# Patient Record
Sex: Female | Born: 1945 | Race: White | Hispanic: No | Marital: Married | Smoking: Never smoker
Health system: Southern US, Community
[De-identification: ages and names within clinical notes are randomized; demographics above are authoritative.]

## PROBLEM LIST (undated history)

## (undated) DIAGNOSIS — I499 Cardiac arrhythmia, unspecified: Secondary | ICD-10-CM

## (undated) DIAGNOSIS — N200 Calculus of kidney: Secondary | ICD-10-CM

## (undated) DIAGNOSIS — Z87442 Personal history of urinary calculi: Secondary | ICD-10-CM

## (undated) DIAGNOSIS — J45909 Unspecified asthma, uncomplicated: Secondary | ICD-10-CM

## (undated) DIAGNOSIS — I480 Paroxysmal atrial fibrillation: Secondary | ICD-10-CM

## (undated) DIAGNOSIS — R011 Cardiac murmur, unspecified: Secondary | ICD-10-CM

## (undated) DIAGNOSIS — M1990 Unspecified osteoarthritis, unspecified site: Secondary | ICD-10-CM

## (undated) DIAGNOSIS — T8859XA Other complications of anesthesia, initial encounter: Secondary | ICD-10-CM

## (undated) DIAGNOSIS — Z9889 Other specified postprocedural states: Secondary | ICD-10-CM

## (undated) DIAGNOSIS — R112 Nausea with vomiting, unspecified: Secondary | ICD-10-CM

## (undated) DIAGNOSIS — E0590 Thyrotoxicosis, unspecified without thyrotoxic crisis or storm: Secondary | ICD-10-CM

## (undated) DIAGNOSIS — R0600 Dyspnea, unspecified: Secondary | ICD-10-CM

## (undated) DIAGNOSIS — E079 Disorder of thyroid, unspecified: Secondary | ICD-10-CM

## (undated) DIAGNOSIS — I10 Essential (primary) hypertension: Secondary | ICD-10-CM

## (undated) DIAGNOSIS — A159 Respiratory tuberculosis unspecified: Secondary | ICD-10-CM

## (undated) DIAGNOSIS — E039 Hypothyroidism, unspecified: Secondary | ICD-10-CM

## (undated) DIAGNOSIS — F329 Major depressive disorder, single episode, unspecified: Secondary | ICD-10-CM

## (undated) DIAGNOSIS — E785 Hyperlipidemia, unspecified: Secondary | ICD-10-CM

## (undated) DIAGNOSIS — F32A Depression, unspecified: Secondary | ICD-10-CM

## (undated) HISTORY — DX: Essential (primary) hypertension: I10

## (undated) HISTORY — PX: LITHOTRIPSY: SUR834

## (undated) HISTORY — DX: Depression, unspecified: F32.A

## (undated) HISTORY — DX: Calculus of kidney: N20.0

## (undated) HISTORY — DX: Paroxysmal atrial fibrillation: I48.0

## (undated) HISTORY — DX: Disorder of thyroid, unspecified: E07.9

## (undated) HISTORY — DX: Thyrotoxicosis, unspecified without thyrotoxic crisis or storm: E05.90

## (undated) HISTORY — DX: Hyperlipidemia, unspecified: E78.5

## (undated) HISTORY — DX: Major depressive disorder, single episode, unspecified: F32.9

---

## 1969-03-10 HISTORY — PX: THYROIDECTOMY: SHX17

## 1977-03-10 HISTORY — PX: TUBAL LIGATION: SHX77

## 2009-03-10 HISTORY — PX: REPLACEMENT TOTAL KNEE BILATERAL: SUR1225

## 2010-07-18 ENCOUNTER — Encounter: Payer: Self-pay | Admitting: Family Medicine

## 2010-07-18 ENCOUNTER — Ambulatory Visit (INDEPENDENT_AMBULATORY_CARE_PROVIDER_SITE_OTHER): Payer: Self-pay | Admitting: Family Medicine

## 2010-07-18 DIAGNOSIS — E88819 Insulin resistance, unspecified: Secondary | ICD-10-CM | POA: Insufficient documentation

## 2010-07-18 DIAGNOSIS — I10 Essential (primary) hypertension: Secondary | ICD-10-CM

## 2010-07-18 DIAGNOSIS — R7309 Other abnormal glucose: Secondary | ICD-10-CM

## 2010-07-18 DIAGNOSIS — K760 Fatty (change of) liver, not elsewhere classified: Secondary | ICD-10-CM

## 2010-07-18 DIAGNOSIS — K219 Gastro-esophageal reflux disease without esophagitis: Secondary | ICD-10-CM

## 2010-07-18 DIAGNOSIS — E8881 Metabolic syndrome: Secondary | ICD-10-CM

## 2010-07-18 DIAGNOSIS — K7689 Other specified diseases of liver: Secondary | ICD-10-CM

## 2010-07-18 DIAGNOSIS — E785 Hyperlipidemia, unspecified: Secondary | ICD-10-CM | POA: Insufficient documentation

## 2010-07-18 MED ORDER — KETOCONAZOLE 2 % EX CREA: TOPICAL_CREAM | Freq: Every day | CUTANEOUS | Status: DC

## 2010-07-18 MED ORDER — LISINOPRIL-HYDROCHLOROTHIAZIDE 10-12.5 MG PO TABS: 1.0000 | ORAL_TABLET | Freq: Every day | ORAL | Status: DC

## 2010-07-18 NOTE — Assessment & Plan Note (Signed)
Blood pressure is well controlled today. Refills were sent to the pharmacy. Followup in 6 months

## 2010-07-18 NOTE — Progress Notes (Signed)
Subjective:    Patient ID: Martha Perkins, female    DOB: 10-14-1945, 65 y.o.   MRN: [id number]  Hypertension This is a chronic problem. The current episode started more than 1 year ago. The problem is unchanged. The problem is controlled. Associated symptoms include peripheral edema. Pertinent negatives include no palpitations or shortness of breath. There are no associated agents to hypertension. Risk factors for coronary artery disease include no known risk factors. Past treatments include ACE inhibitors and diuretics. There are no compliance problems.  There is no history of chronic renal disease.   Hx of elevated sugars - Was dx in 12/2009 with insulin resistance. She is not getting any exercise. She has had bilat knee replacements and just hasn't gotten back in to exercise.   Off her statin for 6 month because of elevated liver enzymes. She had neg hepatis panel and then had an US of the liver that showed fatty liver.  She has not had her liver rechecked since then. She is due for that. Her physician wanted her off of this on for approximately 6 months to see how much her liver enzymes came down. She has not made any major lifestyle changes with diet or exercise actually improve her fatty liver.   Review of Systems  Respiratory: Negative for shortness of breath.   Cardiovascular: Negative for palpitations.   BP 115/71  Pulse 81  Ht 5\' 2"  (1.575 m)  Wt 199 lb (90.266 kg)  BMI 36.40 kg/m2    Allergies  Allergen Reactions  . Demerol   . Percocet (Oxycodone-Acetaminophen)     Past Medical History  Diagnosis Date  . Hypertension   . Hyperlipidemia   . Thyroid disease   . Kidney stone     Past Surgical History  Procedure Date  . Thyroidectomy 1971  . Tubal ligation 1979  . Replacement total knee bilateral 2011  . Lithotripsy     History   Social History  . Marital Status: Married    Spouse Name: N/A    Number of Children: N/A  . Years of Education: N/A   Occupational  History  . Retired Charity fundraiser.     Social History Main Topics  . Smoking status: Never Smoker   . Smokeless tobacco: Not on file  . Alcohol Use: No  . Drug Use: No  . Sexually Active: Not on file   Other Topics Concern  . Not on file   Social History Narrative  . No narrative on file    Family History  Problem Relation Age of Onset  . Coronary artery disease Mother 91    deceased  . Depression Mother   . Diabetes Paternal Grandmother   . Hyperlipidemia Mother     Current outpatient prescriptions:esomeprazole (NEXIUM) 40 MG capsule, Take 40 mg by mouth daily before breakfast.  , Disp: , Rfl: ;  ketoconazole (NIZORAL) 2 % cream, Apply topically daily.  , Disp: , Rfl: ;  lisinopril-hydrochlorothiazide (PRINZIDE,ZESTORETIC) 10-12.5 MG per tablet, Take 1 tablet by mouth daily., Disp: 30 tablet, Rfl: 6;  simvastatin (ZOCOR) 80 MG tablet, Take 80 mg by mouth at bedtime.  , Disp: , Rfl:  DISCONTD: lisinopril-hydrochlorothiazide (PRINZIDE,ZESTORETIC) 10-12.5 MG per tablet, Take 1 tablet by mouth daily.  , Disp: , Rfl: ;  ketoconazole (NIZORAL) 2 % cream, Apply topically daily., Disp: 15 g, Rfl: 1     Objective:   Physical Exam  Constitutional: She is oriented to person, place, and time. She appears well-developed and well-nourished.  HENT:  Head: Normocephalic and atraumatic.  Neck: Neck supple. No thyromegaly present.  Cardiovascular: Normal rate, regular rhythm and normal heart sounds.   Pulmonary/Chest: Effort normal and breath sounds normal.  Lymphadenopathy:    She has no cervical adenopathy.  Neurological: She is alert and oriented to person, place, and time.  Skin: Skin is warm and dry.  Psychiatric: She has a normal mood and affect.          Assessment & Plan:

## 2010-07-18 NOTE — Assessment & Plan Note (Signed)
We discussed it's important make lifestyle and dietary changes to improve her fatty liver. I did encourage her to start an exercise regimen. Now she's had bilateral knee replacements and is healed and doing well I don't see any reason why she can't get regular exercise.

## 2010-07-18 NOTE — Assessment & Plan Note (Signed)
She also has insulin resistance based on her history. She did have an A1c back verified this back in October. I let her know we will recheck a fasting glucose on her blood work to continue to monitor this. I recommend twice yearly monitoring to make sure she is not becoming diabetic. Also again exercise weight loss and healthy diet is very crucial to controlling her sugars. She admits she has not been doing this.

## 2010-07-18 NOTE — Assessment & Plan Note (Signed)
We will recheck her lipids and liver function off of the medication to see what her baseline is. I think she will likely still be a candidate for a statin as this is not a contraindication of fatty liver to discontinue his statin. Continuing a statin sometimes help with fatty liver disease. We can also certainly try a different statin.

## 2010-07-19 ENCOUNTER — Telehealth: Payer: Self-pay | Admitting: Family Medicine

## 2010-07-19 LAB — COMPLETE METABOLIC PANEL WITH GFR
ALT: 124 U/L — ABNORMAL HIGH (ref 0–35)
Alkaline Phosphatase: 67 U/L (ref 39–117)
CO2: 21 mEq/L (ref 19–32)
Creat: 0.69 mg/dL (ref 0.40–1.20)
GFR, Est African American: >60 mL/min (ref >60)
Total Bilirubin: 0.9 mg/dL (ref 0.3–1.2)

## 2010-07-19 LAB — LIPID PANEL
HDL: 42 mg/dL (ref >39)
LDL Cholesterol: 212 mg/dL — ABNORMAL HIGH (ref 0–99)
Total CHOL/HDL Ratio: 7.1 Ratio
Triglycerides: 216 mg/dL — ABNORMAL HIGH (ref <150)

## 2010-07-19 MED ORDER — PITAVASTATIN CALCIUM 2 MG PO TABS: 20.0000 mg | ORAL_TABLET | Freq: Every evening | ORAL | Status: DC

## 2010-07-19 NOTE — Telephone Encounter (Signed)
Call patient: Liver enzymes are still elevated AST was 66 ALT was 124. Since we cannot establish that this is her baseline liver function with your fatty liver disease then we can go ahead and restart a statin and then recheck your liver function in 6 weeks. If it has not changed significantly then we can continue a statin but it seems to trend even higher then we'll have to discontinue it again. I sent a prescription to [facility] for Livalo. If she has time she can come office and also pick up a coupon to take money off of her co-pay and she may get a fairly reasonable price.Marland Kitchen

## 2010-07-22 ENCOUNTER — Other Ambulatory Visit: Payer: Self-pay | Admitting: Family Medicine

## 2010-07-22 ENCOUNTER — Telehealth: Payer: Self-pay | Admitting: Family Medicine

## 2010-07-22 MED ORDER — PITAVASTATIN CALCIUM 2 MG PO TABS: 2.0000 mg | ORAL_TABLET | Freq: Every evening | ORAL | Status: DC

## 2010-07-22 NOTE — Telephone Encounter (Signed)
Pt.notified

## 2010-07-22 NOTE — Telephone Encounter (Signed)
There is a phone note from Friday on [facility] desktop with info regarding the lab results. Let me know if you can see if or not.

## 2010-07-22 NOTE — Telephone Encounter (Signed)
Pt wants recent lab results.  They appear abnormal.  Is there specific orders/instructions needed?  Please advise. Plan:  Routed to Dr. Theodosia Paling, CMA Jarvis Newcomer, LPN Domingo Dimes

## 2010-07-23 ENCOUNTER — Encounter: Payer: Self-pay | Admitting: Family Medicine

## 2010-08-06 NOTE — Telephone Encounter (Signed)
Pt was notified by Sue Lush, CMA about lab results.

## 2010-08-07 ENCOUNTER — Encounter: Payer: Self-pay | Admitting: Family Medicine

## 2010-10-15 ENCOUNTER — Telehealth: Payer: Self-pay | Admitting: Family Medicine

## 2010-10-15 NOTE — Telephone Encounter (Signed)
Pt wants to know which labs that she needs to repeat.  Wants to come on Thursday to do labwork.  I looked at the last office visit, but want to make sure we order everything needed.  Please advise and I'll be glad to put in the order.  Thanks. Routed to th provider for orders. Jarvis Newcomer, LPN Domingo Dimes

## 2010-10-16 ENCOUNTER — Telehealth: Payer: Self-pay | Admitting: Family Medicine

## 2010-10-16 DIAGNOSIS — E785 Hyperlipidemia, unspecified: Secondary | ICD-10-CM

## 2010-10-16 NOTE — Telephone Encounter (Signed)
Lipid panel and hepatic panel

## 2010-10-16 NOTE — Telephone Encounter (Signed)
Pt informed that lab orders were put in for a hepatic function panel and lipid profile.  Told the orders at [facility] so may go at there convenience to the lab. Jarvis Newcomer, LPN Domingo Dimes

## 2010-10-16 NOTE — Telephone Encounter (Signed)
Closed encounter °

## 2010-10-18 ENCOUNTER — Telehealth: Payer: Self-pay | Admitting: Family Medicine

## 2010-10-18 ENCOUNTER — Other Ambulatory Visit: Payer: Self-pay | Admitting: Family Medicine

## 2010-10-18 LAB — HEPATIC FUNCTION PANEL
ALT: 154 U/L — ABNORMAL HIGH (ref 0–35)
AST: 112 U/L — ABNORMAL HIGH (ref 0–37)
Alkaline Phosphatase: 67 U/L (ref 39–117)
Bilirubin, Direct: 0.2 mg/dL (ref 0.0–0.3)
Total Bilirubin: 1 mg/dL (ref 0.3–1.2)

## 2010-10-18 LAB — LIPID PANEL
Cholesterol: 189 mg/dL (ref 0–200)
HDL: 43 mg/dL (ref >39)
Total CHOL/HDL Ratio: 4.4 Ratio

## 2010-10-18 NOTE — Telephone Encounter (Signed)
Call pt: LDL chol and TG look much better on the livalo!!!. Continue med adn recheck in 1 year. She is due for a shingles vaccine will you see if she wants to come in for nurse visit this week to get one. Liver is still elevated but stable. This tells me the enzymes are from fatty liver and not the chol pill. Make sure still getting regular exericse and eating a very low fat diet.

## 2010-10-18 NOTE — Telephone Encounter (Signed)
Pt.notified

## 2010-10-21 ENCOUNTER — Other Ambulatory Visit: Payer: Self-pay | Admitting: *Deleted

## 2010-10-21 MED ORDER — PITAVASTATIN CALCIUM 2 MG PO TABS: 2.0000 mg | ORAL_TABLET | Freq: Every day | ORAL | Status: DC

## 2010-11-18 ENCOUNTER — Other Ambulatory Visit: Payer: Self-pay | Admitting: Family Medicine

## 2010-12-21 ENCOUNTER — Inpatient Hospital Stay (INDEPENDENT_AMBULATORY_CARE_PROVIDER_SITE_OTHER)
Admission: RE | Admit: 2010-12-21 | Discharge: 2010-12-21 | Disposition: A | Discharge status: Home / Self Care | Payer: Self-pay | Source: Ambulatory Visit | Attending: Family Medicine | Admitting: Family Medicine

## 2010-12-21 ENCOUNTER — Encounter: Payer: Self-pay | Admitting: Family Medicine

## 2010-12-21 DIAGNOSIS — I10 Essential (primary) hypertension: Secondary | ICD-10-CM | POA: Insufficient documentation

## 2010-12-21 DIAGNOSIS — J069 Acute upper respiratory infection, unspecified: Secondary | ICD-10-CM

## 2010-12-21 DIAGNOSIS — E785 Hyperlipidemia, unspecified: Secondary | ICD-10-CM | POA: Insufficient documentation

## 2010-12-21 DIAGNOSIS — J0100 Acute maxillary sinusitis, unspecified: Secondary | ICD-10-CM

## 2011-01-17 ENCOUNTER — Ambulatory Visit (INDEPENDENT_AMBULATORY_CARE_PROVIDER_SITE_OTHER): Payer: Self-pay | Admitting: Family Medicine

## 2011-01-17 ENCOUNTER — Encounter: Payer: Self-pay | Admitting: Family Medicine

## 2011-01-17 VITALS — BP 122/72 | HR 78 | Temp 98.8°F | Ht 62.0 in | Wt 205.0 lb

## 2011-01-17 DIAGNOSIS — J329 Chronic sinusitis, unspecified: Secondary | ICD-10-CM

## 2011-01-17 DIAGNOSIS — J40 Bronchitis, not specified as acute or chronic: Secondary | ICD-10-CM

## 2011-01-17 MED ORDER — HYDROCODONE-HOMATROPINE 5-1.5 MG/5ML PO SYRP: 5.0000 mL | ORAL_SOLUTION | Freq: Every evening | ORAL | Status: AC | PRN

## 2011-01-17 MED ORDER — DOXYCYCLINE HYCLATE 100 MG PO TABS: 100.0000 mg | ORAL_TABLET | Freq: Two times a day (BID) | ORAL | Status: DC

## 2011-01-17 MED ORDER — DOXYCYCLINE HYCLATE 100 MG PO TABS: 100.0000 mg | ORAL_TABLET | Freq: Two times a day (BID) | ORAL | Status: AC

## 2011-01-17 MED ORDER — LISINOPRIL-HYDROCHLOROTHIAZIDE 10-12.5 MG PO TABS: 1.0000 | ORAL_TABLET | Freq: Every day | ORAL | Status: DC

## 2011-01-17 MED ORDER — PITAVASTATIN CALCIUM 2 MG PO TABS: 2.0000 mg | ORAL_TABLET | Freq: Every day | ORAL | Status: DC

## 2011-01-17 NOTE — Progress Notes (Signed)
  Subjective:    Patient ID: Martha Perkins, female    DOB: Feb 16, 1946, 65 y.o.   MRN: [id number]  HPI Sinus pressuee and congestion about a month ago and with cough (approx 10/11). Got worse on 10/13 so came to UC. Given amox 500mg  tid and took for a week and then got worse. Was getting pain in her chest.  +Wheezing.  Then went to UC in [place] 12/28/10 and did CXR and no PNA or fluid.  Given and inhaler and given a zpack.  Says sinuses a little better but same chest sxs after took zpack. Still having congsetion, pressure in face and post nasal drip with yellow and sometime clear d/c. Cough is mild better. Still having chest discomfort in the muscles from coughing. Still having wheezing at night.  No energy. No fever now but did initially.    Review of Systems     Objective:   Physical Exam  Constitutional: She is oriented to person, place, and time. She appears well-developed and well-nourished.  HENT:  Head: Normocephalic and atraumatic.  Right Ear: External ear normal.  Left Ear: External ear normal.  Nose: Nose normal.  Mouth/Throat: Oropharynx is clear and moist.       TMs and canals are clear.   Eyes: Conjunctivae and EOM are normal. Pupils are equal, round, and reactive to light.  Neck: Neck supple. No thyromegaly present.  Cardiovascular: Normal rate, regular rhythm and normal heart sounds.   Pulmonary/Chest: Effort normal and breath sounds normal. She has no wheezes.  Lymphadenopathy:    She has no cervical adenopathy.  Neurological: She is alert and oriented to person, place, and time.  Skin: Skin is warm and dry.  Psychiatric: She has a normal mood and affect.          Assessment & Plan:  Sinusitis/Bronchitis - dicussed still could be viral even though had sxs for 4 week, thought she is some better. She has failed 2 rounds of ABX BUT can't afford a FQ. Will tx with doxy and pt to call if not better in one week. Also given cough med.  Was given one at [facility] but it cost $80 so she  didn't fill it. Has been uusing Delsym.   Needs refills on her BP and chol med.  BP looks great today.

## 2011-01-17 NOTE — Patient Instructions (Signed)
Call if not better in one week.  

## 2011-01-28 ENCOUNTER — Telehealth: Payer: Self-pay | Admitting: *Deleted

## 2011-01-28 MED ORDER — AMOXICILLIN-POT CLAVULANATE 875-125 MG PO TABS: 1.0000 | ORAL_TABLET | Freq: Two times a day (BID) | ORAL | Status: AC

## 2011-01-28 NOTE — Telephone Encounter (Signed)
I sent in a new antibiotic. She's not feeling better by Monday or Tuesday to please make an office visit for further examination.

## 2011-01-28 NOTE — Telephone Encounter (Signed)
Pt notified. KJ LPN 

## 2011-01-28 NOTE — Telephone Encounter (Signed)
Pt calls and states that seen around 10 days ago and still has persistent cough, sinus congestion and drainage, white phlegm, pain really bad in chest and right side and right side back. Ear pain bilateraaly. Painful swollen lymph node under right arm. Finished all meds given. Please advise

## 2011-02-10 NOTE — Progress Notes (Signed)
Summary: ?SINUS INFX (room 4)   Vital Signs:  Patient Profile:   65 Years Old Female CC:      congestion with cough and wheezing x 3 days Height:     62 inches Weight:      198 pounds O2 Sat:      95 % O2 treatment:    Room Air Temp:     98.8 degrees F oral Pulse rate:   98 / minute Resp:     16 per minute BP sitting:   142 / 87  (left arm) Cuff size:   regular  Vitals Entered By: Lavell Islam RN (December 21, 2010 12:01 PM)                  Updated Prior Medication List: * LIVALO daily * LISINOPRIL daily  Current Allergies: ! DEMEROL ! PERCODANHistory of Present Illness Chief Complaint: congestion with cough and wheezing x 3 days History of Present Illness:  Subjective: Patient complains of sinus congestion that started 3 days ago, followed by a mild sore throat and non-productive cough.  She has a history of seasonal allergies and recurring sinusitis.   No pleuritic pain + wheezing + post-nasal drainage + sinus pain/pressure No itchy/red eyes No earache No hemoptysis No SOB + low grade fever/chills No nausea No vomiting No abdominal pain No diarrhea No skin rashes + fatigue + myalgias No headache Used OTC meds without relief   REVIEW OF SYSTEMS Constitutional Symptoms       Complains of fever.     Denies chills, night sweats, weight loss, weight gain, and fatigue.  Eyes       Denies change in vision, eye pain, eye discharge, glasses, contact lenses, and eye surgery. Ear/Nose/Throat/Mouth       Complains of sinus problems.      Denies hearing loss/aids, change in hearing, ear pain, ear discharge, dizziness, frequent runny nose, frequent nose bleeds, sore throat, hoarseness, and tooth pain or bleeding.  Respiratory       Complains of dry cough and wheezing.      Denies productive cough, shortness of breath, asthma, bronchitis, and emphysema/COPD.      Comments: chest pain with cough Cardiovascular       Denies murmurs, chest pain, and tires easily  with exhertion.    Gastrointestinal       Denies stomach pain, nausea/vomiting, diarrhea, constipation, blood in bowel movements, and indigestion. Genitourniary       Denies painful urination, kidney stones, and loss of urinary control. Neurological       Denies paralysis, seizures, and fainting/blackouts. Musculoskeletal       Denies muscle pain, joint pain, joint stiffness, decreased range of motion, redness, swelling, muscle weakness, and gout.  Skin       Denies bruising, unusual mles/lumps or sores, and hair/skin or nail changes.  Psych       Denies mood changes, temper/anger issues, anxiety/stress, speech problems, depression, and sleep problems. Other Comments: congestion, cough with chest pain x 3 days   Past History:  Past Medical History: Hyperlipidemia Hypertension arthritis renal calculi  Past Surgical History: subtotal thyroidectomy urethroplasty Tubal ligation bilateral knee replacements lithotripsy  Family History: Family History of CAD  Family History Hypertension Family History Thyroid disease COPD  Social History: Never Smoked Alcohol use-no Drug use-no Smoking Status:  never Drug Use:  no   Objective:  No acute distress  Eyes:  Pupils are equal, round, and reactive to light and accomodation.  Extraocular  movement is intact.  Conjunctivae are not inflamed.  Ears:  Canals normal.  Tympanic membranes normal.   Nose:  Mildly congested turbinates.   Bilateral maxillary sinus tenderness  Pharynx:  Normal  Neck:  Supple.  Slightly tender shotty posterior nodes are palpated bilaterally.  Lungs:  Clear to auscultation.  Breath sounds are equal.  Chest:  There is tenderness over the sternum Heart:  Regular rate and rhythm without murmurs, rubs, or gallops.  Abdomen:  Nontender without masses or hepatosplenomegaly.  Bowel sounds are present.  No CVA or flank tenderness.  Extremities:  No edema.  Skin:  No rash Assessment New Problems: ACUTE  MAXILLARY SINUSITIS (ICD-461.0) UPPER RESPIRATORY INFECTION, ACUTE (ICD-465.9) FAMILY HISTORY OF CAD FEMALE 1ST DEGREE RELATIVE <50 (ICD-V17.3) HYPERTENSION (ICD-401.9) HYPERLIPIDEMIA (ICD-272.4)  VIRAL URI WITH EARLY SINUSITIS   Plan New Medications/Changes: BENZONATATE 200 MG CAPS (BENZONATATE) One by mouth hs as needed cough  #12 x 0, 12/21/2010, Donna Christen MD AMOXICILLIN 500 MG CAPS (AMOXICILLIN) One capsule by mouth three times a day  #42 x 0, 12/21/2010, Donna Christen MD LIVALO daily  #30 x 0, 12/21/2010, Donna Christen MD  New Orders: New Patient Level III [phone] Planning Comments:   Begin amoxicillin, expectorant/decongestant, topical decongestant, cough suppressant at bedtime.  Increase fluid intake Followup with PCP if not improving 7 to 10 days   The patient and/or caregiver has been counseled thoroughly with regard to medications prescribed including dosage, schedule, interactions, rationale for use, and possible side effects and they verbalize understanding.  Diagnoses and expected course of recovery discussed and will return if not improved as expected or if the condition worsens. Patient and/or caregiver verbalized understanding.  Prescriptions: BENZONATATE 200 MG CAPS (BENZONATATE) One by mouth hs as needed cough  #12 x 0   Entered and Authorized by:   Donna Christen MD   Signed by:   Donna Christen MD on 12/21/2010   Method used:   Print then Give to Patient   RxID:   [id number] AMOXICILLIN 500 MG CAPS (AMOXICILLIN) One capsule by mouth three times a day  #42 x 0   Entered and Authorized by:   Donna Christen MD   Signed by:   Donna Christen MD on 12/21/2010   Method used:   Print then Give to Patient   RxID:   [id number] LIVALO daily  #30 x 0   Entered by:   Lavell Islam RN   Authorized by:   Donna Christen MD   Signed by:   Donna Christen MD on 12/21/2010   Method used:   Print then Give to Patient   RxID:   [id number]   Patient  Instructions: 1)  Take Mucinex D (guaifenesin with decongestant) twice daily for congestion. 2)  Increase fluid intake, rest. 3)  Stop antihistamines for now. 4)  May use Afrin nasal spray (or generic oxymetazoline) twice daily for about 5 days.  Also recommend using saline nasal spray several times daily and/or saline nasal irrigation. 5)  Followup with family doctor if not improving 7 to 10 days.   Orders Added: 1)  New Patient Level III [42595]

## 2011-02-26 ENCOUNTER — Other Ambulatory Visit: Payer: Self-pay | Admitting: *Deleted

## 2011-02-26 MED ORDER — LISINOPRIL-HYDROCHLOROTHIAZIDE 10-12.5 MG PO TABS: 1.0000 | ORAL_TABLET | Freq: Every day | ORAL | Status: DC

## 2011-08-25 ENCOUNTER — Ambulatory Visit (INDEPENDENT_AMBULATORY_CARE_PROVIDER_SITE_OTHER): Payer: Self-pay | Admitting: Family Medicine

## 2011-08-25 ENCOUNTER — Encounter: Payer: Self-pay | Admitting: Family Medicine

## 2011-08-25 ENCOUNTER — Telehealth: Payer: Self-pay | Admitting: *Deleted

## 2011-08-25 VITALS — BP 125/72 | HR 79 | Ht 62.0 in | Wt 198.0 lb

## 2011-08-25 DIAGNOSIS — I10 Essential (primary) hypertension: Secondary | ICD-10-CM

## 2011-08-25 DIAGNOSIS — E785 Hyperlipidemia, unspecified: Secondary | ICD-10-CM

## 2011-08-25 DIAGNOSIS — M25519 Pain in unspecified shoulder: Secondary | ICD-10-CM

## 2011-08-25 LAB — COMPLETE METABOLIC PANEL WITH GFR
ALT: 63 U/L — ABNORMAL HIGH (ref 0–35)
AST: 35 U/L (ref 0–37)
Albumin: 4.3 g/dL (ref 3.5–5.2)
CO2: 27 mEq/L (ref 19–32)
Calcium: 9.6 mg/dL (ref 8.4–10.5)
Chloride: 106 mEq/L (ref 96–112)
Creat: 0.65 mg/dL (ref 0.50–1.10)
GFR, Est African American: >89 mL/min
Potassium: 4.3 mEq/L (ref 3.5–5.3)
Sodium: 142 mEq/L (ref 135–145)
Total Protein: 6.8 g/dL (ref 6.0–8.3)

## 2011-08-25 MED ORDER — PRAVASTATIN SODIUM 40 MG PO TABS: 40.0000 mg | ORAL_TABLET | Freq: Every day | ORAL | Status: DC

## 2011-08-25 NOTE — Patient Instructions (Signed)
Rotator Cuff Tear The rotator cuff is four tendons that assist in the motion of the shoulder. A rotator cuff tear is a tear in one of these four tendons. It is characterized by pain and weakness of the shoulder. The rotator cuff tendons surround the shoulder ball and socket joint (humeral head). The rotator cuff tendons attach to the shoulder blade (scapula) on one side and the upper arm bone (humerus) on the other side. The rotator cuff is essential for shoulder stability and shoulder motion. SYMPTOMS    Pain around the shoulder, often at the outer portion of the upper arm.   Pain that is worse with shoulder function, especially when reaching overhead or lifting.   Weakness of the shoulder muscles.   Aching when not using your arm; often, pain awakens you at night, especially when sleeping on the affected side.   Tenderness, swelling, warmth, or redness over the outer aspect of the shoulder.   Loss of strength.   Limited motion of the shoulder, especially reaching behind (reaching into one's back pocket) or across your body.   A crackling sound (crepitation) when moving the shoulder.   Biceps tendon pain (in the front of the shoulder) and inflammation, worse with bending the elbow or lifting.  CAUSES    Strain from sudden increase in amount or intensity of activity.   Direct blow or injury to the shoulder.   Aging, wear from from normal use.   Roof of the shoulder (acromial) spur.  RISK INCREASES WITH:    Contact sports (football, wrestling, or boxing).   Throwing or hitting sports (baseball, tennis, or volleyball).   Weightlifting and bodybuilding.   Heavy labor.   Previous injury to rotator cuff.   Failure to warm up properly before activity.   Inadequate protective equipment.   Increasing age.   Spurring of the outer end of the scapula (acromion).   Cortisone injections.   Poor shoulder strength and flexibility.  PREVENTION  Warm up and stretch properly  before activity.   Allow time for rest and recovery between practices and competition.   Maintain physical fitness:   Cardiovascular fitness.   Shoulder flexibility.   Strength and endurance of the rotator cuff muscles and muscles of the shoulder blade.   Learn and use proper technique when throwing or hitting.  PROGNOSIS Surgery is often needed. Although, symptoms may go away by themselves. RELATED COMPLICATIONS    Persistent pain that may progress to constant pain.   Shoulder stiffness, frozen shoulder syndrome, or loss of motion.   Recurrence of symptoms, especially if treated without surgery.   Inability to return to same level of sports, even with surgery.   Persistent weakness.   Risks of surgery, including infection, bleeding, injury to nerves, shoulder stiffness, weakness, re-tearing of the rotator cuff tendon.   Deltoid detachment, acromial fracture, and persistent pain.  TREATMENT Treatment involves the use of ice and medicine to reduce pain and inflammation. Strengthening and stretching exercise are usually recommended. These exercises may be completed at home or with a therapist. You may also be instructed to modify offending activities. Corticosteroid injections may be given to reduce inflammation. Surgery is usually recommended for athletes. Surgery has the best chance for a full recovery. Surgery involves:  Removal of an inflamed bursa.   Removal of an acromial spur if present.   Suturing the torn tendon back together.  Rotator cuff surgeries may be preformed either arthroscopically or through an open incision. Recovery typically takes 6 to 12   months. MEDICATION  If pain medicine is necessary, then nonsteroidal anti-inflammatory medicines, such as aspirin and ibuprofen, or other minor pain relievers, such as acetaminophen, are often recommended.   Do not take pain medicine for 7 days before surgery.   Prescription pain relievers are usually only prescribed  after surgery. Use only as directed and only as much as you need.   Corticosteroid injections may be given to reduce inflammation. However, there is a limited number of times the joint may be injected with these medicines.  HEAT AND COLD  Cold treatment (icing) relieves pain and reduces inflammation. Cold treatment should be applied for 10 to 15 minutes every 2 to 3 hours for inflammation and pain and immediately after any activity that aggravates your symptoms. Use ice packs or massage the area with a piece of ice (ice massage).   Heat treatment may be used prior to performing the stretching and strengthening activities prescribed by your caregiver, physical therapist, or athletic trainer. Use a heat pack or soak the injury in warm water.  SEEK MEDICAL CARE IF:    Symptoms get worse or do not improve in 4 to 6 weeks despite treatment.   You experience pain, numbness, or coldness in the hand.   Blue, gray, or dark color appears in the fingernails.   New, unexplained symptoms develop (drugs used in treatment may produce side effects).  Document Released: 02/24/2005 Document Revised: 02/13/2011 Document Reviewed: 06/08/2008 [employer] Patient Information 2012 [employer]. 

## 2011-08-25 NOTE — Progress Notes (Signed)
  Subjective:    Patient ID: Martha Perkins, female    DOB: 12/11/1945, 66 y.o.   MRN: [id number]  HPI Left shouldre pain. Hard to lift arm to shave under her arms. Painful to sleep on that side. Taking IBU for ehr arthritis.  No old injuries or surgeries.    HTN - tolerating.  No CP or SOB.  GEts winded with walking.    Hyperlipidemia-she stopped a little low because the cost. She is currently without insurance. She will not be of get it for another year and half until she reaches age 73 with Medicare.  She has been of statin for several months.    Review of Systems     Objective:   Physical Exam  Constitutional: She is oriented to person, place, and time. She appears well-developed and well-nourished.  HENT:  Head: Normocephalic and atraumatic.  Cardiovascular: Normal rate, regular rhythm and normal heart sounds.   Pulmonary/Chest: Effort normal and breath sounds normal.  Musculoskeletal:       Left shoulder w/ extension to 160 degrees.  Painful to reach behind her back. + empty can test.  Hand strength is 5/6. Non tender over teh shoulder joint.   Neurological: She is alert and oriented to person, place, and time.  Skin: Skin is warm and dry.  Psychiatric: She has a normal mood and affect. Her behavior is normal.          Assessment & Plan:  Left shoulder pain - mild RCC tear vs bursitis. Given H.O. On exercises.  Offered tramadol at bedtime prn if needed. Can use IBU as has already been using.   HTN- well controlled. At goal.  Check BMP today.   Hyperlipidemia - couldn't afford the livalo.  Will change to pravastatin. It may not get her to gaol but will at least help until gets insurance.  Also work on daliy walking, exercise and low fat diet.

## 2011-08-28 ENCOUNTER — Other Ambulatory Visit: Payer: Self-pay | Admitting: Family Medicine

## 2011-10-22 ENCOUNTER — Encounter: Payer: Self-pay | Admitting: Family Medicine

## 2011-10-22 ENCOUNTER — Ambulatory Visit (INDEPENDENT_AMBULATORY_CARE_PROVIDER_SITE_OTHER): Payer: Self-pay | Admitting: Family Medicine

## 2011-10-22 VITALS — BP 148/78 | HR 75 | Temp 97.8°F | Ht 62.0 in | Wt 200.0 lb

## 2011-10-22 DIAGNOSIS — R109 Unspecified abdominal pain: Secondary | ICD-10-CM

## 2011-10-22 DIAGNOSIS — N209 Urinary calculus, unspecified: Secondary | ICD-10-CM

## 2011-10-22 DIAGNOSIS — R1030 Lower abdominal pain, unspecified: Secondary | ICD-10-CM

## 2011-10-22 LAB — POCT URINALYSIS DIPSTICK
Ketones, UA: NEGATIVE
Protein, UA: NEGATIVE
Spec Grav, UA: 1.01
Urobilinogen, UA: 0.2

## 2011-10-22 MED ORDER — TAMSULOSIN HCL 0.4 MG PO CAPS: 0.4000 mg | ORAL_CAPSULE | Freq: Every day | ORAL | Status: DC

## 2011-10-22 MED ORDER — HYDROCODONE-ACETAMINOPHEN 5-325 MG PO TABS: 1.0000 | ORAL_TABLET | Freq: Four times a day (QID) | ORAL | Status: DC | PRN

## 2011-10-22 NOTE — Progress Notes (Signed)
  Subjective:    Patient ID: Martha Perkins, female    DOB: Apr 17, 1945, 66 y.o.   MRN: [id number]  HPI   has been waking up with twinges up pain in her pelvic area. Feels worse the last couple of days. Feels like sittig on a hot poker. Started several weeks ago. Says has been colicky. Says feels like a kidney stone she had about 8 years ago.  Hx of recurrent UTIs.  Hx of external urethroplasty years ago.  Will occ have blood in her urine even when not having pain. That has been going on for years.  No fever.  No flank pain.  Had to have lithotripsy.  Using naproxen for pain relief- Helps some.   Review of Systems     Objective:   Physical Exam  Constitutional: She is oriented to person, place, and time. She appears well-developed and well-nourished.  HENT:  Head: Normocephalic and atraumatic.  Cardiovascular: Normal rate, regular rhythm and normal heart sounds.   Pulmonary/Chest: Effort normal and breath sounds normal.  Neurological: She is alert and oriented to person, place, and time.  Skin: Skin is warm and dry.  Psychiatric: She has a normal mood and affect. Her behavior is normal.          Assessment & Plan:  Urinary tract stone - Discussed options. I would like ot refer her to Urology  Since she has had sxs for several week, but she doesn't have insurance. She wants to hold off. I explained that if has been going on for several weeks then she likley will not pass the stone.   Will given pain medication. Says has taken hydrocodone before and did ok. Will start flomax. Given strainer as well.  If not passing stone in a few days then will HAVE TO see Urology.

## 2011-10-22 NOTE — Patient Instructions (Signed)

## 2011-10-25 ENCOUNTER — Other Ambulatory Visit: Payer: Self-pay | Admitting: Family Medicine

## 2011-10-25 LAB — URINE CULTURE

## 2011-10-25 MED ORDER — CIPROFLOXACIN HCL 500 MG PO TABS: 500.0000 mg | ORAL_TABLET | Freq: Two times a day (BID) | ORAL | Status: AC

## 2011-11-24 ENCOUNTER — Telehealth: Payer: Self-pay | Admitting: *Deleted

## 2011-11-24 MED ORDER — AMOXICILLIN-POT CLAVULANATE 875-125 MG PO TABS: 1.0000 | ORAL_TABLET | Freq: Two times a day (BID) | ORAL | Status: AC

## 2011-11-24 NOTE — Telephone Encounter (Signed)
I don't normally send abx in over the phone but will do this time. Sent rx to her pharm listed below.

## 2011-11-24 NOTE — Telephone Encounter (Signed)
Pt states that she is having some sinus issues and congestion with cough and would like to know if we will send an abx to pharmacy. States she has no insurance and is still paying on the last 2 visits. Please advise.

## 2011-11-24 NOTE — Telephone Encounter (Signed)
Pt informed

## 2011-12-22 ENCOUNTER — Telehealth: Payer: Self-pay | Admitting: *Deleted

## 2011-12-22 ENCOUNTER — Encounter: Payer: Self-pay | Admitting: Physician Assistant

## 2011-12-22 ENCOUNTER — Ambulatory Visit (INDEPENDENT_AMBULATORY_CARE_PROVIDER_SITE_OTHER): Payer: No Typology Code available for payment source | Admitting: Physician Assistant

## 2011-12-22 VITALS — BP 129/66 | HR 85 | Temp 98.1°F | Ht 62.0 in | Wt 193.0 lb

## 2011-12-22 DIAGNOSIS — R05 Cough: Secondary | ICD-10-CM

## 2011-12-22 DIAGNOSIS — J329 Chronic sinusitis, unspecified: Secondary | ICD-10-CM

## 2011-12-22 DIAGNOSIS — R062 Wheezing: Secondary | ICD-10-CM

## 2011-12-22 MED ORDER — AZITHROMYCIN 250 MG PO TABS: ORAL_TABLET | ORAL | Status: DC

## 2011-12-22 MED ORDER — ALBUTEROL SULFATE HFA 108 (90 BASE) MCG/ACT IN AERS: 2.0000 | INHALATION_SPRAY | Freq: Four times a day (QID) | RESPIRATORY_TRACT | Status: DC | PRN

## 2011-12-22 MED ORDER — METHYLPREDNISOLONE 4 MG PO KIT: PACK | ORAL | Status: DC

## 2011-12-22 NOTE — Telephone Encounter (Signed)
Sorry wrong patient for the [employer]  This patient has a sinus infection with green drainage and wanted ABT's called to pharm

## 2011-12-22 NOTE — Telephone Encounter (Signed)
Needs appt. Can see Lesly Rubenstein today.

## 2011-12-22 NOTE — Patient Instructions (Signed)

## 2011-12-22 NOTE — Telephone Encounter (Signed)
Patient here in office seeing PA for sinus infection

## 2011-12-22 NOTE — Telephone Encounter (Signed)
Patient called c/o [employer] and [employer] and wants to know if she needs to be seen or if something could be called in for her.

## 2011-12-22 NOTE — Progress Notes (Signed)
  Subjective:    Patient ID: Martha Perkins, female    DOB: April 12, 1945, 66 y.o.   MRN: [id number]  HPI Patient is a 66 year old female who presents to the clinic with sinus pressure, congestion, cough. At the beginning of September Dr. Vinie Sill in prescription for Augmentin for 10 days for sinusitis. She did begin to feel better after finishing antibiotic but after about a week started to feel worse. She has a lot of sinus pressure. She feels very congested in her years. She still has drainage dripping down her throat. She coughs a lot during the day mostly nonproductive. Her cough is worse at night. She does have a long history of sinusitis. She has seen an ENT ears bad told her her sinuses are under developed. She's been taking decongestants over the past month and they do help some. She denies any chest pains or palpitations. She has not had any fever or chills. She has had some ongoing shortness of breath when she tries to do her normal activities. She denies any diagnosis of asthma or lung diseases.  Review of Systems     Objective:   Physical Exam  Constitutional: She is oriented to person, place, and time. She appears well-developed and well-nourished.  HENT:  Head: Normocephalic and atraumatic.  Right Ear: External ear normal.  Left Ear: External ear normal.  Nose: Nose normal.  Mouth/Throat: Oropharynx is clear and moist. No oropharyngeal exudate.       TMs clear bilaterally. No blood or pus. Oropharynx clear with some postnasal drip present.  Patient speak tender to palpation along the maxillary sinuses bilaterally negative for tenderness to palpation on the frontal sinuses.  Eyes: Conjunctivae normal are normal.  Neck: Normal range of motion. Neck supple.  Cardiovascular: Normal rate, regular rhythm and normal heart sounds.   Pulmonary/Chest: Effort normal.       Wheezing was heard at the base of bilateral lungs along with coarse breath sounds.  Lymphadenopathy:    She has no  cervical adenopathy.  Neurological: She is alert and oriented to person, place, and time.  Skin: Skin is warm and dry.  Psychiatric: She has a normal mood and affect. Her behavior is normal.          Assessment & Plan:  Sinusitis/bronchitis- will treat with Z-Pak for 5 days. Gave handout on sinusitis and bronchitis symptomatic care. Will also give Medrol dose pack and albuterol inhaler to use as needed. If patient not improving in the next 48 hours she is to call office. If sinus infection does not clear after this dose of antibiotic we may consider CT of the sinuses for possible for of need for sinus lavage.

## 2011-12-29 ENCOUNTER — Telehealth: Payer: Self-pay | Admitting: *Deleted

## 2011-12-29 MED ORDER — BENZONATATE 100 MG PO CAPS: 100.0000 mg | ORAL_CAPSULE | Freq: Three times a day (TID) | ORAL | Status: DC | PRN

## 2011-12-29 MED ORDER — HYDROCODONE-HOMATROPINE 5-1.5 MG/5ML PO SYRP: 5.0000 mL | ORAL_SOLUTION | Freq: Every evening | ORAL | Status: DC | PRN

## 2011-12-29 NOTE — Telephone Encounter (Signed)
I see an allergy to oxycodone/acetominophen. The cough syrup I could give before bed has hydrocodone in it. Have you ever had this before? If not I can give tessalon pearle to take up to three times a days. Let me know.

## 2011-12-29 NOTE — Telephone Encounter (Signed)
Sent cough syrup and tessalone pearles. Call if not improving or worsening.

## 2011-12-29 NOTE — Telephone Encounter (Signed)
Martha Perkins advised of medications and to call back if she does not improve or gets worse.

## 2011-12-29 NOTE — Telephone Encounter (Signed)
Spoke with pt and she says she has had the hydrocodone with phenergan in it in syrup before and does ok with it

## 2011-12-29 NOTE — Telephone Encounter (Signed)
Pt calls and states that still has horrible cough and would like to have a cough med sent to [facility] in [place]

## 2012-02-09 ENCOUNTER — Other Ambulatory Visit: Payer: Self-pay | Admitting: *Deleted

## 2012-02-09 MED ORDER — KETOCONAZOLE 2 % EX CREA: TOPICAL_CREAM | Freq: Every day | CUTANEOUS | Status: DC

## 2012-03-05 ENCOUNTER — Other Ambulatory Visit: Payer: Self-pay | Admitting: Family Medicine

## 2012-03-09 ENCOUNTER — Ambulatory Visit (INDEPENDENT_AMBULATORY_CARE_PROVIDER_SITE_OTHER): Payer: No Typology Code available for payment source | Admitting: Sports Medicine

## 2012-03-09 ENCOUNTER — Encounter: Payer: Self-pay | Admitting: Sports Medicine

## 2012-03-09 VITALS — BP 140/80 | HR 96 | Wt 195.0 lb

## 2012-03-09 DIAGNOSIS — J0100 Acute maxillary sinusitis, unspecified: Secondary | ICD-10-CM

## 2012-03-09 DIAGNOSIS — I10 Essential (primary) hypertension: Secondary | ICD-10-CM

## 2012-03-09 MED ORDER — AMOXICILLIN-POT CLAVULANATE 875-125 MG PO TABS: 1.0000 | ORAL_TABLET | Freq: Two times a day (BID) | ORAL | Status: DC

## 2012-03-09 MED ORDER — FLUTICASONE PROPIONATE 50 MCG/ACT NA SUSP: NASAL | Status: DC

## 2012-03-09 MED ORDER — LISINOPRIL-HYDROCHLOROTHIAZIDE 10-12.5 MG PO TABS: 1.0000 | ORAL_TABLET | Freq: Every day | ORAL | Status: DC

## 2012-03-09 NOTE — Assessment & Plan Note (Signed)
Augmentin, Flonase. Return to clinic as needed.

## 2012-03-09 NOTE — Assessment & Plan Note (Signed)
Refilled blood pressure medication. She will make her followup appointment with Dr. Linford Arnold.

## 2012-03-09 NOTE — Progress Notes (Signed)
Subjective:    CC: Sick  HPI: This is a very pleasant 66 year old female comes in with a several-day history of cough is productive of greenish sputum, greenish nasal discharge, sinus pressure over her maxillary sinuses. The pain and pressure does not radiate. She's had subjective fevers and chills, denies GI symptoms, denies sore throat. Symptoms are moderate.  Past medical history, Surgical history, Family history, Social history, Allergies, and medications have been entered into the medical record, reviewed, and no changes needed.   Review of Systems: No fevers, chills, night sweats, weight loss, chest pain, or shortness of breath.   Objective:    General: Well Developed, well nourished, and in no acute distress.  Neuro: Alert and oriented x3, extra-ocular muscles intact.  HEENT: Normocephalic, atraumatic, pupils equal round reactive to light, neck supple, no masses, no lymphadenopathy, thyroid nonpalpable. Nasopharynx shows boggy erythematous turbinates, oropharynx is unremarkable, external ear canals unremarkable. She is tender to palpation over the maxillary sinuses. Skin: Warm and dry, no rashes. Cardiac: Regular rate and rhythm, no murmurs rubs or gallops.  Respiratory: Clear to auscultation bilaterally. Not using accessory muscles, speaking in full sentences.  Impression and Recommendations:

## 2012-03-22 ENCOUNTER — Ambulatory Visit: Payer: Self-pay | Admitting: Family Medicine

## 2012-06-03 ENCOUNTER — Ambulatory Visit (INDEPENDENT_AMBULATORY_CARE_PROVIDER_SITE_OTHER): Payer: No Typology Code available for payment source | Admitting: Family Medicine

## 2012-06-03 ENCOUNTER — Encounter: Payer: Self-pay | Admitting: Family Medicine

## 2012-06-03 VITALS — BP 126/72 | HR 83 | Ht 62.0 in | Wt 193.0 lb

## 2012-06-03 DIAGNOSIS — I10 Essential (primary) hypertension: Secondary | ICD-10-CM

## 2012-06-03 DIAGNOSIS — H269 Unspecified cataract: Secondary | ICD-10-CM | POA: Insufficient documentation

## 2012-06-03 MED ORDER — LISINOPRIL-HYDROCHLOROTHIAZIDE 10-12.5 MG PO TABS: 1.0000 | ORAL_TABLET | Freq: Every day | ORAL | Status: DC

## 2012-06-03 NOTE — Progress Notes (Signed)
  Subjective:    Patient ID: Martha Perkins, female    DOB: 1945-06-20, 67 y.o.   MRN: [id number]  HPI HTN-  Pt denies chest pain, SOB, dizziness, or heart palpitations.  Taking meds as directed w/o problems.  Denies medication side effects. No lower Schimke edema. She was recently diagnosed with cataracts and is being followed by her eye doctor.  She also has a history of impaired fasting glucose and will finally get her health insurance sometime in July so we will hold off for testing until then per patient request.    Review of Systems     Objective:   Physical Exam  Constitutional: She is oriented to person, place, and time. She appears well-developed and well-nourished.  HENT:  Head: Normocephalic and atraumatic.  Cardiovascular: Normal rate, regular rhythm and normal heart sounds.   No carotid bruits.   Pulmonary/Chest: Effort normal and breath sounds normal.  Musculoskeletal: She exhibits no edema.  Neurological: She is alert and oriented to person, place, and time.  Skin: Skin is warm and dry.  Psychiatric: She has a normal mood and affect. Her behavior is normal.          Assessment & Plan:  HTN - well controlled.  F/U in 6 months.  Continue current regimen. Refill sent to her pharmacy. Continue work on low-salt diet and regular exercise and weight loss.

## 2012-06-28 ENCOUNTER — Telehealth: Payer: Self-pay | Admitting: *Deleted

## 2012-06-28 ENCOUNTER — Encounter: Payer: Self-pay | Admitting: *Deleted

## 2012-06-28 NOTE — Telephone Encounter (Signed)
Pt calls and states having exam and cleaning of teeth and has 2 artificial knees and needs prophylactic antibiotic sent to pharmacy. [name], LPN

## 2012-06-29 NOTE — Telephone Encounter (Signed)
Pt notified. Preslie Depasquale, LPN  

## 2012-06-29 NOTE — Telephone Encounter (Signed)
New guidelines don't recommend prophylaxis with antibiotic with artifical joints anymore unless have immunosupression, which she doesn't have.  This is good news!!!

## 2012-09-07 HISTORY — PX: CATARACT EXTRACTION: SUR2

## 2012-09-28 DIAGNOSIS — H538 Other visual disturbances: Secondary | ICD-10-CM | POA: Diagnosis not present

## 2012-09-28 DIAGNOSIS — H2510 Age-related nuclear cataract, unspecified eye: Secondary | ICD-10-CM | POA: Diagnosis not present

## 2012-10-04 DIAGNOSIS — H5200 Hypermetropia, unspecified eye: Secondary | ICD-10-CM | POA: Diagnosis not present

## 2012-10-04 DIAGNOSIS — M129 Arthropathy, unspecified: Secondary | ICD-10-CM | POA: Diagnosis not present

## 2012-10-04 DIAGNOSIS — H524 Presbyopia: Secondary | ICD-10-CM | POA: Diagnosis not present

## 2012-10-04 DIAGNOSIS — E7800 Pure hypercholesterolemia, unspecified: Secondary | ICD-10-CM | POA: Diagnosis not present

## 2012-10-04 DIAGNOSIS — H25049 Posterior subcapsular polar age-related cataract, unspecified eye: Secondary | ICD-10-CM | POA: Diagnosis not present

## 2012-10-04 DIAGNOSIS — M069 Rheumatoid arthritis, unspecified: Secondary | ICD-10-CM | POA: Diagnosis not present

## 2012-10-04 DIAGNOSIS — H5210 Myopia, unspecified eye: Secondary | ICD-10-CM | POA: Diagnosis not present

## 2012-10-04 DIAGNOSIS — Z885 Allergy status to narcotic agent status: Secondary | ICD-10-CM | POA: Diagnosis not present

## 2012-10-04 DIAGNOSIS — Z79899 Other long term (current) drug therapy: Secondary | ICD-10-CM | POA: Diagnosis not present

## 2012-10-04 DIAGNOSIS — H2510 Age-related nuclear cataract, unspecified eye: Secondary | ICD-10-CM | POA: Diagnosis not present

## 2012-10-04 DIAGNOSIS — H52209 Unspecified astigmatism, unspecified eye: Secondary | ICD-10-CM | POA: Diagnosis not present

## 2012-10-04 DIAGNOSIS — H538 Other visual disturbances: Secondary | ICD-10-CM | POA: Diagnosis not present

## 2012-10-04 DIAGNOSIS — H269 Unspecified cataract: Secondary | ICD-10-CM | POA: Diagnosis not present

## 2012-10-04 DIAGNOSIS — H43399 Other vitreous opacities, unspecified eye: Secondary | ICD-10-CM | POA: Diagnosis not present

## 2012-10-04 DIAGNOSIS — I10 Essential (primary) hypertension: Secondary | ICD-10-CM | POA: Diagnosis not present

## 2012-11-26 ENCOUNTER — Ambulatory Visit (INDEPENDENT_AMBULATORY_CARE_PROVIDER_SITE_OTHER): Payer: Medicare Other | Admitting: Family Medicine

## 2012-11-26 ENCOUNTER — Encounter: Payer: Self-pay | Admitting: Family Medicine

## 2012-11-26 ENCOUNTER — Other Ambulatory Visit (HOSPITAL_COMMUNITY)
Admission: RE | Admit: 2012-11-26 | Discharge: 2012-11-26 | Disposition: A | Discharge status: Home / Self Care | Payer: Medicare Other | Source: Ambulatory Visit | Attending: Family Medicine | Admitting: Family Medicine

## 2012-11-26 VITALS — BP 124/73 | HR 74 | Wt 186.0 lb

## 2012-11-26 DIAGNOSIS — Z1151 Encounter for screening for human papillomavirus (HPV): Secondary | ICD-10-CM | POA: Insufficient documentation

## 2012-11-26 DIAGNOSIS — Z0000 Encounter for general adult medical examination without abnormal findings: Secondary | ICD-10-CM | POA: Diagnosis not present

## 2012-11-26 DIAGNOSIS — I10 Essential (primary) hypertension: Secondary | ICD-10-CM

## 2012-11-26 DIAGNOSIS — Z23 Encounter for immunization: Secondary | ICD-10-CM

## 2012-11-26 DIAGNOSIS — Z1211 Encounter for screening for malignant neoplasm of colon: Secondary | ICD-10-CM

## 2012-11-26 DIAGNOSIS — Z01419 Encounter for gynecological examination (general) (routine) without abnormal findings: Secondary | ICD-10-CM | POA: Insufficient documentation

## 2012-11-26 DIAGNOSIS — Z124 Encounter for screening for malignant neoplasm of cervix: Secondary | ICD-10-CM

## 2012-11-26 DIAGNOSIS — Z1231 Encounter for screening mammogram for malignant neoplasm of breast: Secondary | ICD-10-CM

## 2012-11-26 LAB — COMPLETE METABOLIC PANEL WITH GFR
AST: 22 U/L (ref 0–37)
Albumin: 4.5 g/dL (ref 3.5–5.2)
BUN: 18 mg/dL (ref 6–23)
Calcium: 9.6 mg/dL (ref 8.4–10.5)
Chloride: 102 mEq/L (ref 96–112)
Glucose, Bld: 97 mg/dL (ref 70–99)
Potassium: 3.8 mEq/L (ref 3.5–5.3)

## 2012-11-26 LAB — LIPID PANEL
Cholesterol: 292 mg/dL — ABNORMAL HIGH (ref 0–200)
VLDL: 40 mg/dL (ref 0–40)

## 2012-11-26 NOTE — Patient Instructions (Signed)
Keep up a regular exercise program and make sure you are eating a healthy diet Try to eat 4 servings of dairy a day, or if you are lactose intolerant take a calcium with vitamin D daily.  Your vaccines are up to date.   

## 2012-11-26 NOTE — Progress Notes (Addendum)
Subjective:    Martha Perkins is a 67 y.o. female who presents for a welcome to Medicare exam. She has some updates.  She did have her left cataract treated in July of this year by Dr. Darcus Pester, ophtho in [place].  She also needs a refill on her blood pressure medications today. She also wants to discuss that she has had some more constipation recently and what would be the best thing to take. She also wants repeat her A1c to see how she's doing with her impaired fasting glucose. She has tried to make some dietary changes and has actually lost some weight which is fantastic.  Cardiac risk factors: advanced age (older than 22 for men, 33 for women), obesity (BMI >= 30 kg/m2) and sedentary lifestyle.  Activities of Daily Living  In your present state of health, do you have any difficulty performing the following activities?:  Preparing food and eating?: No Bathing yourself: No Getting dressed: No Using the toilet:No Moving around from place to place: No In the past year have you fallen or had a near fall?:No  Current exercise habits: walking some   Dietary issues discussed: none   Depression Screen (Note: if answer to either of the following is "Yes", then a more complete depression screening is indicated)  Q1: Over the past two weeks, have you felt down, depressed or hopeless?no Q2: Over the past two weeks, have you felt little interest or pleasure in doing things? no   The following portions of the patient's history were reviewed and updated as appropriate: allergies, current medications, past family history, past medical history, past social history, past surgical history and problem list. Review of Systems A comprehensive review of systems was negative.    Objective:     Vision by Snellen chart: just had cataract surgery in July There were no vitals taken for this visit. There is no weight on file to calculate BMI. There were no vitals taken for this visit.  General  Appearance:    Alert, cooperative, no distress, appears stated age  Head:    Normocephalic, without obvious abnormality, atraumatic  Eyes:    PERRL, conjunctiva/corneas clear, EOM's intact, fundi    benign, both eyes  Ears:    Normal TM's and external ear canals, both ears  Nose:   Nares normal, septum midline, mucosa normal, no drainage    or sinus tenderness  Throat:   Lips, mucosa, and tongue normal; teeth and gums normal  Neck:   Supple, symmetrical, trachea midline, no adenopathy;    thyroid:  no enlargement/tenderness/nodules; no carotid   bruit or JVD  Back:     Symmetric, no curvature, ROM normal, no CVA tenderness  Lungs:     Clear to auscultation bilaterally, respirations unlabored  Chest Wall:    No tenderness or deformity   Heart:    Regular rate and rhythm, S1 and S2 normal, no murmur, rub   or gallop  Breast Exam:    No tenderness, masses, or nipple abnormality  Abdomen:     Soft, non-tender, bowel sounds active all four quadrants,    no masses, no organomegaly  Genitalia:    Normal female without lesion, discharge or tenderness  Rectal:    Normal tone, normal prostate, no masses or tenderness;   guaiac negative stool  Extremities:   Extremities normal, atraumatic, no cyanosis or edema  Pulses:   2+ and symmetric all extremities  Skin:   Skin color, texture, turgor normal, no rashes or lesions  Lymph nodes:   Cervical, supraclavicular, and axillary nodes normal  Neurologic:   CNII-XII intact, normal strength, sensation and reflexes    throughout      Assessment:    Welcome Medicare visit     Plan:     During the course of the visit the patient was educated and counseled about appropriate screening and preventive services including:   Pneumococcal vaccine   Influenza vaccine  Constipation-encouraged her to increase her fluid intake and she oftentimes will go all day without drinking anything. Also encouraged her start a daily stool softener as part of her  regimen. Explained her that this is safe to do long-term and keep things a little but more regulated instead of waiting until she gets back up and then tried to take some type of laxative.  Occasionally has an odor to her urine. She's not having a right now but would like to have her urine checked. We will give her a sample cup to take home to test  It happens and she did bring up and we can check it.  Hypertension-well-controlled. Due for screening lipids and CMP.  Fatty liver/elevated liver enzymes-due to recheck liver enzymes today.  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  IFG - repeat A1C today is in the normal range.  Recheck in 1 year.   EKG shows rate of 72 beats per minute, normal sinus rhythm with normal axis. Possible Q wave in lead 3.  Patient Instructions (the written plan) was given to the patient.

## 2012-11-28 ENCOUNTER — Other Ambulatory Visit: Payer: Self-pay | Admitting: Family Medicine

## 2012-11-28 MED ORDER — SIMVASTATIN 40 MG PO TABS: 40.0000 mg | ORAL_TABLET | Freq: Every day | ORAL | Status: DC

## 2012-11-29 ENCOUNTER — Encounter: Payer: Self-pay | Admitting: *Deleted

## 2012-11-30 ENCOUNTER — Ambulatory Visit (INDEPENDENT_AMBULATORY_CARE_PROVIDER_SITE_OTHER): Payer: Medicare Other

## 2012-11-30 DIAGNOSIS — Z1231 Encounter for screening mammogram for malignant neoplasm of breast: Secondary | ICD-10-CM | POA: Diagnosis not present

## 2013-01-25 ENCOUNTER — Encounter: Payer: Self-pay | Admitting: Family Medicine

## 2013-02-02 ENCOUNTER — Other Ambulatory Visit: Payer: Self-pay | Admitting: Physician Assistant

## 2013-02-09 ENCOUNTER — Telehealth: Payer: Self-pay | Admitting: *Deleted

## 2013-02-09 MED ORDER — ALBUTEROL SULFATE HFA 108 (90 BASE) MCG/ACT IN AERS: 2.0000 | INHALATION_SPRAY | Freq: Four times a day (QID) | RESPIRATORY_TRACT | Status: DC | PRN

## 2013-02-09 NOTE — Telephone Encounter (Signed)
Changed inhaler to ProAir.

## 2013-03-08 DIAGNOSIS — J019 Acute sinusitis, unspecified: Secondary | ICD-10-CM | POA: Diagnosis not present

## 2013-04-12 ENCOUNTER — Other Ambulatory Visit: Payer: Self-pay | Admitting: Family Medicine

## 2013-04-14 ENCOUNTER — Telehealth: Payer: Self-pay | Admitting: *Deleted

## 2013-04-14 NOTE — Telephone Encounter (Signed)
Pt called wanting advice on what she can take until she is seen by provider on tomorrow. Pt advised to try sudafed,delsym, mucinex, aleve, zinc and vit c. .Elouise Munroe

## 2013-04-15 ENCOUNTER — Encounter: Payer: Self-pay | Admitting: Physician Assistant

## 2013-04-15 ENCOUNTER — Ambulatory Visit (INDEPENDENT_AMBULATORY_CARE_PROVIDER_SITE_OTHER): Payer: Medicare Other | Admitting: Physician Assistant

## 2013-04-15 VITALS — BP 140/80 | HR 109 | Temp 98.0°F | Wt 194.0 lb

## 2013-04-15 DIAGNOSIS — R059 Cough, unspecified: Secondary | ICD-10-CM

## 2013-04-15 DIAGNOSIS — J209 Acute bronchitis, unspecified: Secondary | ICD-10-CM | POA: Diagnosis not present

## 2013-04-15 DIAGNOSIS — R062 Wheezing: Secondary | ICD-10-CM | POA: Diagnosis not present

## 2013-04-15 DIAGNOSIS — R05 Cough: Secondary | ICD-10-CM | POA: Diagnosis not present

## 2013-04-15 MED ORDER — PREDNISONE 50 MG PO TABS: ORAL_TABLET | ORAL | Status: DC

## 2013-04-15 MED ORDER — BENZONATATE 200 MG PO CAPS: 200.0000 mg | ORAL_CAPSULE | Freq: Two times a day (BID) | ORAL | Status: DC | PRN

## 2013-04-15 MED ORDER — AZITHROMYCIN 250 MG PO TABS: ORAL_TABLET | ORAL | Status: DC

## 2013-04-15 MED ORDER — HYDROCODONE-HOMATROPINE 5-1.5 MG/5ML PO SYRP: 5.0000 mL | ORAL_SOLUTION | Freq: Every evening | ORAL | Status: DC | PRN

## 2013-04-15 MED ORDER — ALBUTEROL SULFATE (2.5 MG/3ML) 0.083% IN NEBU
2.5000 mg | INHALATION_SOLUTION | Freq: Once | RESPIRATORY_TRACT | Status: AC
  Administered 2013-04-15: 2.5 mg via RESPIRATORY_TRACT

## 2013-04-15 MED ORDER — ALBUTEROL SULFATE HFA 108 (90 BASE) MCG/ACT IN AERS: 2.0000 | INHALATION_SPRAY | Freq: Four times a day (QID) | RESPIRATORY_TRACT | Status: DC | PRN

## 2013-04-15 NOTE — Patient Instructions (Signed)
Start zpak and prednisone for 5 days.  Albuterol as needed.  Tessalone pearles during the day.  Hycodan at night.   Acute Bronchitis Bronchitis is inflammation of the airways that extend from the windpipe into the lungs (bronchi). The inflammation often causes mucus to develop. This leads to a cough, which is the most common symptom of bronchitis.  In acute bronchitis, the condition usually develops suddenly and goes away over time, usually in a couple weeks. Smoking, allergies, and asthma can make bronchitis worse. Repeated episodes of bronchitis may cause further lung problems.  CAUSES Acute bronchitis is most often caused by the same virus that causes a cold. The virus can spread from person to person (contagious).  SIGNS AND SYMPTOMS   Cough.   Fever.   Coughing up mucus.   Body aches.   Chest congestion.   Chills.   Shortness of breath.   Sore throat.  DIAGNOSIS  Acute bronchitis is usually diagnosed through a physical exam. Tests, such as chest X-rays, are sometimes done to rule out other conditions.  TREATMENT  Acute bronchitis usually goes away in a couple weeks. Often times, no medical treatment is necessary. Medicines are sometimes given for relief of fever or cough. Antibiotics are usually not needed but may be prescribed in certain situations. In some cases, an inhaler may be recommended to help reduce shortness of breath and control the cough. A cool mist vaporizer may also be used to help thin bronchial secretions and make it easier to clear the chest.  HOME CARE INSTRUCTIONS  Get plenty of rest.   Drink enough fluids to keep your urine clear or pale yellow (unless you have a medical condition that requires fluid restriction). Increasing fluids may help thin your secretions and will prevent dehydration.   Only take over-the-counter or prescription medicines as directed by your health care provider.   Avoid smoking and secondhand smoke. Exposure to  cigarette smoke or irritating chemicals will make bronchitis worse. If you are a smoker, consider using nicotine gum or skin patches to help control withdrawal symptoms. Quitting smoking will help your lungs heal faster.   Reduce the chances of another bout of acute bronchitis by washing your hands frequently, avoiding people with cold symptoms, and trying not to touch your hands to your mouth, nose, or eyes.   Follow up with your health care provider as directed.  SEEK MEDICAL CARE IF: Your symptoms do not improve after 1 week of treatment.  SEEK IMMEDIATE MEDICAL CARE IF:  You develop an increased fever or chills.   You have chest pain.   You have severe shortness of breath.  You have bloody sputum.   You develop dehydration.  You develop fainting.  You develop repeated vomiting.  You develop a severe headache. MAKE SURE YOU:   Understand these instructions.  Will watch your condition.  Will get help right away if you are not doing well or get worse. Document Released: 04/03/2004 Document Revised: 10/27/2012 Document Reviewed: 08/17/2012 [facility] Patient Information 2014 [place].

## 2013-04-15 NOTE — Progress Notes (Signed)
   Subjective:    Patient ID: Martha Perkins, female    DOB: February 24, 1946, 68 y.o.   MRN: [id number]  HPI Patient is a 68 year old female who presents to the clinic with cough, wheezing, shortness of breath and chest pain for the last week and half. She was seen after Christmas and diagnosed with a sinus infection. She was treated with Augmentin. She did get some better but drainage remained. She's had continual congestion since. Over the last week and a half her wheezing has gotten worse. Her cough is very productive and has turned green in color. She denies any fever or chills or body aches. She has been trying Sudafed, Mucinex, Robitussin DM and allergy medication. She has significant sinus pressure and ear pressure. She has a sore throat that she believes is from the cough. She's been trying her albuterol inhaler but has been using an expired inhaler.    Review of Systems     Objective:   Physical Exam  Constitutional: She is oriented to person, place, and time. She appears well-developed and well-nourished.  HENT:  Head: Normocephalic and atraumatic.  Right Ear: External ear normal.  Left Ear: External ear normal.  Nose: Nose normal.  Mouth/Throat: Oropharynx is clear and moist.  Neck: Normal range of motion. Neck supple.  Cardiovascular: Normal rate, regular rhythm and normal heart sounds.   Pulmonary/Chest: Effort normal and breath sounds normal.  Did not listen to patient's lung pre-nebulizer. After nebulizer there was some rhonchi in bilateral lung bases but no wheezing heard. Patient is able to complete sentences and is in no respiratory distress.  Lymphadenopathy:    She has no cervical adenopathy.  Neurological: She is alert and oriented to person, place, and time.  Skin: Skin is dry.  Psychiatric: She has a normal mood and affect. Her behavior is normal.          Assessment & Plan:  Acute bronchitis-peak flows in the yellow zone and nebulizer of albuterol 2.5 mg was given  in office today. Patient was started on Z-Pak for 5 days with prednisone burst for 5 days. Tessalon Perles were given for cough during the day as well as Hycodan for cough at bedtime. Patient was given pain mother refill on her albuterol inhaler and instructed to use for the next couple days regularly at least every 6-8 hours. She can begin to back off and she feels better.

## 2013-04-21 ENCOUNTER — Telehealth: Payer: Self-pay | Admitting: *Deleted

## 2013-04-21 ENCOUNTER — Other Ambulatory Visit: Payer: Self-pay | Admitting: *Deleted

## 2013-04-21 DIAGNOSIS — J0100 Acute maxillary sinusitis, unspecified: Secondary | ICD-10-CM

## 2013-04-21 MED ORDER — FLUTICASONE PROPIONATE 50 MCG/ACT NA SUSP: NASAL | Status: DC

## 2013-04-21 NOTE — Telephone Encounter (Signed)
Pt notified & flonase refill sent to pharm.

## 2013-04-21 NOTE — Telephone Encounter (Signed)
Pt was treated for bronchitis on 2/6 by Luvenia Starch. Pt left vm this morning saying that she finished the z-pack & is using her inhaler q6 but still has a lot of sinus pressure, coughing, & is still wheezing.

## 2013-04-21 NOTE — Telephone Encounter (Signed)
Needs to use her flonase and increase to 2 sprays each nostril BID, also get OTC decongestant like dayquil.

## 2013-05-06 ENCOUNTER — Telehealth: Payer: Self-pay | Admitting: *Deleted

## 2013-05-06 ENCOUNTER — Other Ambulatory Visit: Payer: Self-pay | Admitting: Physician Assistant

## 2013-05-06 MED ORDER — PREDNISONE 50 MG PO TABS: ORAL_TABLET | ORAL | Status: DC

## 2013-05-06 NOTE — Telephone Encounter (Signed)
Left detailed message on vm.

## 2013-05-06 NOTE — Telephone Encounter (Signed)
Pt left message stating that even after all the meds & the increase of flonase that Dr. Darene Lamer advised, she is still coughing & wheezing.  She stated on her vm that she could not come in today.

## 2013-05-06 NOTE — Telephone Encounter (Signed)
Will send over prednisone for weekend. If continues come in Monday for office visit. Continue symptomatic care previously discussed.

## 2013-05-06 NOTE — Telephone Encounter (Signed)
Continue to use albuterol every 4-6 hours.

## 2013-05-27 ENCOUNTER — Ambulatory Visit: Payer: Self-pay | Admitting: Family Medicine

## 2013-05-31 ENCOUNTER — Ambulatory Visit (INDEPENDENT_AMBULATORY_CARE_PROVIDER_SITE_OTHER): Payer: Medicare Other

## 2013-05-31 ENCOUNTER — Encounter: Payer: Self-pay | Admitting: Family Medicine

## 2013-05-31 ENCOUNTER — Ambulatory Visit (INDEPENDENT_AMBULATORY_CARE_PROVIDER_SITE_OTHER): Payer: Medicare Other | Admitting: Family Medicine

## 2013-05-31 VITALS — BP 132/74 | HR 83 | Wt 197.0 lb

## 2013-05-31 DIAGNOSIS — I10 Essential (primary) hypertension: Secondary | ICD-10-CM

## 2013-05-31 DIAGNOSIS — M19019 Primary osteoarthritis, unspecified shoulder: Secondary | ICD-10-CM

## 2013-05-31 DIAGNOSIS — M25511 Pain in right shoulder: Secondary | ICD-10-CM

## 2013-05-31 DIAGNOSIS — M25519 Pain in unspecified shoulder: Secondary | ICD-10-CM

## 2013-05-31 DIAGNOSIS — J329 Chronic sinusitis, unspecified: Secondary | ICD-10-CM | POA: Diagnosis not present

## 2013-05-31 LAB — COMPLETE METABOLIC PANEL WITH GFR
ALBUMIN: 4.5 g/dL (ref 3.5–5.2)
ALT: 36 U/L — ABNORMAL HIGH (ref 0–35)
AST: 26 U/L (ref 0–37)
Alkaline Phosphatase: 56 U/L (ref 39–117)
BUN: 14 mg/dL (ref 6–23)
CO2: 27 meq/L (ref 19–32)
Calcium: 9.6 mg/dL (ref 8.4–10.5)
Chloride: 104 mEq/L (ref 96–112)
Creat: 0.65 mg/dL (ref 0.50–1.10)
GFR, Est African American: >89 mL/min
GFR, Est Non African American: >89 mL/min
Glucose, Bld: 99 mg/dL (ref 70–99)
POTASSIUM: 4 meq/L (ref 3.5–5.3)
SODIUM: 139 meq/L (ref 135–145)
TOTAL PROTEIN: 7 g/dL (ref 6.0–8.3)
Total Bilirubin: 0.9 mg/dL (ref 0.2–1.2)

## 2013-05-31 LAB — LIPID PANEL
CHOLESTEROL: 230 mg/dL — AB (ref 0–200)
HDL: 48 mg/dL (ref >39)
LDL Cholesterol: 148 mg/dL — ABNORMAL HIGH (ref 0–99)
Total CHOL/HDL Ratio: 4.8 Ratio
Triglycerides: 169 mg/dL — ABNORMAL HIGH (ref <150)
VLDL: 34 mg/dL (ref 0–40)

## 2013-05-31 MED ORDER — AMOXICILLIN-POT CLAVULANATE 875-125 MG PO TABS: 1.0000 | ORAL_TABLET | Freq: Two times a day (BID) | ORAL | Status: DC

## 2013-05-31 MED ORDER — TRAMADOL HCL 50 MG PO TABS: 50.0000 mg | ORAL_TABLET | Freq: Three times a day (TID) | ORAL | Status: DC | PRN

## 2013-05-31 MED ORDER — SIMVASTATIN 40 MG PO TABS: 40.0000 mg | ORAL_TABLET | Freq: Every day | ORAL | Status: DC

## 2013-05-31 MED ORDER — LISINOPRIL-HYDROCHLOROTHIAZIDE 10-12.5 MG PO TABS: 1.0000 | ORAL_TABLET | Freq: Every day | ORAL | Status: DC

## 2013-05-31 NOTE — Progress Notes (Signed)
   Subjective:    Patient ID: Martha Perkins, female    DOB: 1946-02-10, 68 y.o.   MRN: [id number]  HPI Hypertension- Pt denies chest pain, SOB, dizziness, or heart palpitations.  Taking meds as directed w/o problems.  Denies medication side effects.     After Christmas had a severe sinusitis and went to UC and given antibiotic and started feeling some better and then went down into chest.  Then given albuterol and flonase.  Still hearing some crackles in her chest in the AM. Stil havins sig post nasal drip that is choking her.  Making her gag.  No fever or chills or sweat.  Teeth are hurting on the right side of face and occ right ear pain.  Had a normal dental checkup recently    Right shoulder pain for several months.  Says painful when tries to raise her arm.  Reaching back is painful as well.  Put heat on it at night and that helps her rest. Using naproxen once a day for her general arthritis.  No specific injury.  Pain on the top of shoulder and under the underarm.    Hyperlipidemia-tolerating statin well without any side effects or myalgias.  Review of Systems     Objective:   Physical Exam  Constitutional: She is oriented to person, place, and time. She appears well-developed and well-nourished.  HENT:  Head: Normocephalic and atraumatic.  Right Ear: External ear normal.  Left Ear: External ear normal.  Nose: Nose normal.  Mouth/Throat: Oropharynx is clear and moist.  TMs and canals are clear. No facial pain or tenderness.   Eyes: Conjunctivae and EOM are normal. Pupils are equal, round, and reactive to light.  Neck: Neck supple. No thyromegaly present.  Cardiovascular: Normal rate, regular rhythm and normal heart sounds.   Pulmonary/Chest: Effort normal and breath sounds normal. She has no wheezes.  Musculoskeletal:  Right shoulder with decreased extension to about 50. Positive empty can test on the right. She also has slight decreased extension on the left to about 85. She is  unable to reach her low back with her right hand compared to her left. Nontender over the shoulder joint itself. She has difficulty with reaching her opposite shoulder.  Lymphadenopathy:    She has no cervical adenopathy.  Neurological: She is alert and oriented to person, place, and time.  Skin: Skin is warm and dry.  Psychiatric: She has a normal mood and affect.          Assessment & Plan:  HTN - Well controlled. F.U in 6 months  continue current regimen.  Right shoulder pain-strongly suspect rotator cuff tear based on her exam today. Recommend formal physical therapy. We'll give her prescription for tramadol to use on top of the naproxen for more significant pain. Especially if is keeping her awake at night. Followup in 3-4 weeks to make sure that she's improving.  Chronic sinusitis-will treat with Augmentin x14 days. If not significantly improved at that time then please give [place] a call back. Recommend stop the over-the-counter treatments as these may be excessively drying secretions.  Hyperlipidemia-started statin 6 months ago. Due to repeat lipid levels and liver enzymes.

## 2013-06-01 ENCOUNTER — Other Ambulatory Visit: Payer: Self-pay | Admitting: *Deleted

## 2013-06-01 DIAGNOSIS — R748 Abnormal levels of other serum enzymes: Secondary | ICD-10-CM

## 2013-06-13 ENCOUNTER — Ambulatory Visit (INDEPENDENT_AMBULATORY_CARE_PROVIDER_SITE_OTHER): Payer: Medicare Other | Admitting: Physical Therapy

## 2013-06-13 DIAGNOSIS — M6281 Muscle weakness (generalized): Secondary | ICD-10-CM

## 2013-06-13 DIAGNOSIS — M25519 Pain in unspecified shoulder: Secondary | ICD-10-CM

## 2013-06-13 DIAGNOSIS — R293 Abnormal posture: Secondary | ICD-10-CM

## 2013-06-21 ENCOUNTER — Encounter (INDEPENDENT_AMBULATORY_CARE_PROVIDER_SITE_OTHER): Payer: Medicare Other | Admitting: Physical Therapy

## 2013-06-21 DIAGNOSIS — R293 Abnormal posture: Secondary | ICD-10-CM

## 2013-06-21 DIAGNOSIS — M25519 Pain in unspecified shoulder: Secondary | ICD-10-CM

## 2013-06-21 DIAGNOSIS — M6281 Muscle weakness (generalized): Secondary | ICD-10-CM

## 2013-06-23 ENCOUNTER — Encounter (INDEPENDENT_AMBULATORY_CARE_PROVIDER_SITE_OTHER): Payer: Medicare Other

## 2013-06-23 DIAGNOSIS — M25519 Pain in unspecified shoulder: Secondary | ICD-10-CM

## 2013-06-23 DIAGNOSIS — R293 Abnormal posture: Secondary | ICD-10-CM

## 2013-06-23 DIAGNOSIS — M6281 Muscle weakness (generalized): Secondary | ICD-10-CM

## 2013-06-27 ENCOUNTER — Encounter (INDEPENDENT_AMBULATORY_CARE_PROVIDER_SITE_OTHER): Payer: Medicare Other | Admitting: Physical Therapy

## 2013-06-27 DIAGNOSIS — M6281 Muscle weakness (generalized): Secondary | ICD-10-CM

## 2013-06-27 DIAGNOSIS — M25519 Pain in unspecified shoulder: Secondary | ICD-10-CM

## 2013-06-27 DIAGNOSIS — R293 Abnormal posture: Secondary | ICD-10-CM

## 2013-06-28 ENCOUNTER — Ambulatory Visit: Payer: Self-pay | Admitting: Family Medicine

## 2013-06-30 ENCOUNTER — Encounter (INDEPENDENT_AMBULATORY_CARE_PROVIDER_SITE_OTHER): Payer: Medicare Other

## 2013-06-30 DIAGNOSIS — M25519 Pain in unspecified shoulder: Secondary | ICD-10-CM

## 2013-06-30 DIAGNOSIS — R293 Abnormal posture: Secondary | ICD-10-CM

## 2013-06-30 DIAGNOSIS — M6281 Muscle weakness (generalized): Secondary | ICD-10-CM

## 2013-07-04 ENCOUNTER — Encounter (INDEPENDENT_AMBULATORY_CARE_PROVIDER_SITE_OTHER): Payer: Medicare Other | Admitting: Physical Therapy

## 2013-07-04 DIAGNOSIS — R293 Abnormal posture: Secondary | ICD-10-CM

## 2013-07-04 DIAGNOSIS — M6281 Muscle weakness (generalized): Secondary | ICD-10-CM

## 2013-07-04 DIAGNOSIS — M25519 Pain in unspecified shoulder: Secondary | ICD-10-CM

## 2013-07-07 ENCOUNTER — Telehealth: Payer: Self-pay | Admitting: *Deleted

## 2013-07-07 ENCOUNTER — Encounter (INDEPENDENT_AMBULATORY_CARE_PROVIDER_SITE_OTHER): Payer: Medicare Other | Admitting: Physical Therapy

## 2013-07-07 ENCOUNTER — Ambulatory Visit (INDEPENDENT_AMBULATORY_CARE_PROVIDER_SITE_OTHER): Payer: Medicare Other | Admitting: Family Medicine

## 2013-07-07 ENCOUNTER — Encounter: Payer: Self-pay | Admitting: Family Medicine

## 2013-07-07 VITALS — BP 128/76 | HR 86 | Wt 197.0 lb

## 2013-07-07 DIAGNOSIS — R293 Abnormal posture: Secondary | ICD-10-CM

## 2013-07-07 DIAGNOSIS — M25511 Pain in right shoulder: Secondary | ICD-10-CM

## 2013-07-07 DIAGNOSIS — I10 Essential (primary) hypertension: Secondary | ICD-10-CM

## 2013-07-07 DIAGNOSIS — M25519 Pain in unspecified shoulder: Secondary | ICD-10-CM

## 2013-07-07 DIAGNOSIS — M6281 Muscle weakness (generalized): Secondary | ICD-10-CM

## 2013-07-07 MED ORDER — LISINOPRIL-HYDROCHLOROTHIAZIDE 10-12.5 MG PO TABS: 1.0000 | ORAL_TABLET | Freq: Every day | ORAL | Status: DC

## 2013-07-07 MED ORDER — KETOCONAZOLE 2 % EX CREA: TOPICAL_CREAM | Freq: Every day | CUTANEOUS | Status: DC

## 2013-07-07 MED ORDER — AMOXICILLIN-POT CLAVULANATE 875-125 MG PO TABS
1.0000 | ORAL_TABLET | Freq: Two times a day (BID) | ORAL | Status: DC
Start: 2013-07-07 — End: 2013-09-01

## 2013-07-07 MED ORDER — TRAMADOL HCL 50 MG PO TABS: 50.0000 mg | ORAL_TABLET | Freq: Three times a day (TID) | ORAL | Status: DC | PRN

## 2013-07-07 NOTE — Telephone Encounter (Signed)
No PA required for MRI Shoulder RT w/o contrast.  Oscar La, LPN

## 2013-07-07 NOTE — Progress Notes (Signed)
   Subjective:    Patient ID: Martha Perkins, female    DOB: 20-Oct-1945, 68 y.o.   MRN: [id number]  HPI Right shoulder - has been in PT x 4 weeks.  Still using naproxen and tramadol. Today pain is moslty over the top of the shoulder radiating into her neck.   Hypertension- Pt denies chest pain, SOB, dizziness, or heart palpitations.  Taking meds as directed w/o problems.  Denies medication side effects.    Review of Systems     Objective:   Physical Exam  Constitutional: She is oriented to person, place, and time. She appears well-developed and well-nourished.  HENT:  Head: Normocephalic and atraumatic.  Cardiovascular: Normal rate, regular rhythm and normal heart sounds.   Pulmonary/Chest: Effort normal and breath sounds normal.  Musculoskeletal:  Right shoulder is nontender. She's able to extend arm to about 100. 3 difficult to get past 90. She is able to reach across her other shoulder but with difficulty. She's able to just barely touch her lumbar spine with that arm.  Neurological: She is alert and oriented to person, place, and time.  Skin: Skin is warm and dry.  Psychiatric: She has a normal mood and affect. Her behavior is normal.          Assessment & Plan:  Right shoulder pain - still suspect rotator cuff pain. Will order MRI for further evaluation. I suspect a significant rotator cuff tear injury since she is not improving with 4 weeks of physical therapy, NSAIDs and pain medication.  HTN- blood pressures mildly elevated today. We'll recheck before she goes today. Her pressure is usually better than this. Could be secondary to naproxen. Repeat blood pressure looks fantastic.

## 2013-07-20 ENCOUNTER — Ambulatory Visit (INDEPENDENT_AMBULATORY_CARE_PROVIDER_SITE_OTHER): Payer: Medicare Other

## 2013-07-20 ENCOUNTER — Other Ambulatory Visit: Payer: Self-pay

## 2013-07-20 DIAGNOSIS — M19019 Primary osteoarthritis, unspecified shoulder: Secondary | ICD-10-CM | POA: Diagnosis not present

## 2013-07-20 DIAGNOSIS — M719 Bursopathy, unspecified: Secondary | ICD-10-CM

## 2013-07-20 DIAGNOSIS — M67919 Unspecified disorder of synovium and tendon, unspecified shoulder: Secondary | ICD-10-CM

## 2013-07-20 DIAGNOSIS — Z961 Presence of intraocular lens: Secondary | ICD-10-CM | POA: Diagnosis not present

## 2013-07-20 DIAGNOSIS — M25511 Pain in right shoulder: Secondary | ICD-10-CM

## 2013-07-20 DIAGNOSIS — H2510 Age-related nuclear cataract, unspecified eye: Secondary | ICD-10-CM | POA: Diagnosis not present

## 2013-07-20 DIAGNOSIS — H538 Other visual disturbances: Secondary | ICD-10-CM | POA: Diagnosis not present

## 2013-07-21 ENCOUNTER — Other Ambulatory Visit: Payer: Self-pay | Admitting: *Deleted

## 2013-07-21 DIAGNOSIS — S46009A Unspecified injury of muscle(s) and tendon(s) of the rotator cuff of unspecified shoulder, initial encounter: Secondary | ICD-10-CM

## 2013-07-27 ENCOUNTER — Other Ambulatory Visit: Payer: Self-pay | Admitting: Family Medicine

## 2013-07-31 ENCOUNTER — Other Ambulatory Visit: Payer: Self-pay | Admitting: Family Medicine

## 2013-08-03 ENCOUNTER — Other Ambulatory Visit: Payer: Self-pay | Admitting: Family Medicine

## 2013-08-04 ENCOUNTER — Other Ambulatory Visit: Payer: Self-pay | Admitting: *Deleted

## 2013-08-04 DIAGNOSIS — M25511 Pain in right shoulder: Secondary | ICD-10-CM

## 2013-08-04 MED ORDER — TRAMADOL HCL 50 MG PO TABS: 50.0000 mg | ORAL_TABLET | Freq: Three times a day (TID) | ORAL | Status: DC | PRN

## 2013-08-17 DIAGNOSIS — M19019 Primary osteoarthritis, unspecified shoulder: Secondary | ICD-10-CM | POA: Diagnosis not present

## 2013-08-31 ENCOUNTER — Telehealth: Payer: Self-pay | Admitting: *Deleted

## 2013-08-31 NOTE — Telephone Encounter (Signed)
Pt called and stated that he appt for 6.30 was cancelled and she will be starting a new job on 7.1 and cannot reschedule at this time because of this and she was going to discuss with you about starting her on a pain medication for her shoulder because she was told that she would need a shoulder replacement and cannot do that at this time and wanted to know if you would write her an Rx for Mobic she stated that her husband uses this and she has tried a few and they seem to help with her pain. If you are ok with doing this she would like this to be sent to  [facility] in [place] .Audelia Hives [place]

## 2013-09-01 MED ORDER — MELOXICAM 15 MG PO TABS: 15.0000 mg | ORAL_TABLET | Freq: Every day | ORAL | Status: DC

## 2013-09-01 NOTE — Telephone Encounter (Signed)
Pt informed.Barkley, Tonya Lynetta  

## 2013-09-01 NOTE — Telephone Encounter (Signed)
That is fine. Just encourage her to schedule an appointment as an attorney her insurance kicks in. Also I will go ahead and give her prescription for Mobic to [facility].

## 2013-09-06 ENCOUNTER — Ambulatory Visit: Payer: Self-pay | Admitting: Family Medicine

## 2013-12-19 ENCOUNTER — Other Ambulatory Visit: Payer: Self-pay | Admitting: Family Medicine

## 2013-12-19 DIAGNOSIS — Z9289 Personal history of other medical treatment: Secondary | ICD-10-CM

## 2013-12-19 DIAGNOSIS — Z1231 Encounter for screening mammogram for malignant neoplasm of breast: Secondary | ICD-10-CM

## 2013-12-22 ENCOUNTER — Ambulatory Visit: Payer: Medicare Other

## 2013-12-26 ENCOUNTER — Ambulatory Visit: Payer: Self-pay | Admitting: Family Medicine

## 2013-12-29 ENCOUNTER — Encounter: Payer: Self-pay | Admitting: Family Medicine

## 2013-12-29 ENCOUNTER — Ambulatory Visit (INDEPENDENT_AMBULATORY_CARE_PROVIDER_SITE_OTHER): Payer: Medicare Other | Admitting: Family Medicine

## 2013-12-29 ENCOUNTER — Ambulatory Visit (INDEPENDENT_AMBULATORY_CARE_PROVIDER_SITE_OTHER): Payer: Medicare Other

## 2013-12-29 VITALS — BP 117/79 | HR 84 | Temp 97.9°F | Ht 62.0 in | Wt 194.0 lb

## 2013-12-29 DIAGNOSIS — Z1231 Encounter for screening mammogram for malignant neoplasm of breast: Secondary | ICD-10-CM

## 2013-12-29 DIAGNOSIS — I10 Essential (primary) hypertension: Secondary | ICD-10-CM

## 2013-12-29 DIAGNOSIS — Z23 Encounter for immunization: Secondary | ICD-10-CM

## 2013-12-29 DIAGNOSIS — S46001D Unspecified injury of muscle(s) and tendon(s) of the rotator cuff of right shoulder, subsequent encounter: Secondary | ICD-10-CM

## 2013-12-29 DIAGNOSIS — E785 Hyperlipidemia, unspecified: Secondary | ICD-10-CM

## 2013-12-29 DIAGNOSIS — S46001A Unspecified injury of muscle(s) and tendon(s) of the rotator cuff of right shoulder, initial encounter: Secondary | ICD-10-CM | POA: Insufficient documentation

## 2013-12-29 DIAGNOSIS — J329 Chronic sinusitis, unspecified: Secondary | ICD-10-CM

## 2013-12-29 LAB — COMPLETE METABOLIC PANEL WITH GFR
ALT: 30 U/L (ref 0–35)
AST: 21 U/L (ref 0–37)
Albumin: 4.6 g/dL (ref 3.5–5.2)
Alkaline Phosphatase: 63 U/L (ref 39–117)
BILIRUBIN TOTAL: 1 mg/dL (ref 0.2–1.2)
BUN: 21 mg/dL (ref 6–23)
CALCIUM: 9.7 mg/dL (ref 8.4–10.5)
CO2: 28 meq/L (ref 19–32)
Chloride: 103 mEq/L (ref 96–112)
Creat: 0.74 mg/dL (ref 0.50–1.10)
GFR, Est African American: >89 mL/min
GFR, Est Non African American: 85 mL/min
Glucose, Bld: 95 mg/dL (ref 70–99)
Potassium: 4 mEq/L (ref 3.5–5.3)
SODIUM: 140 meq/L (ref 135–145)
Total Protein: 7.1 g/dL (ref 6.0–8.3)

## 2013-12-29 MED ORDER — ESOMEPRAZOLE MAGNESIUM 40 MG PO CPDR: 40.0000 mg | DELAYED_RELEASE_CAPSULE | Freq: Every day | ORAL | Status: DC

## 2013-12-29 MED ORDER — TRAMADOL HCL 50 MG PO TABS: 50.0000 mg | ORAL_TABLET | Freq: Every evening | ORAL | Status: DC | PRN

## 2013-12-29 MED ORDER — LISINOPRIL-HYDROCHLOROTHIAZIDE 10-12.5 MG PO TABS: 1.0000 | ORAL_TABLET | Freq: Every day | ORAL | Status: DC

## 2013-12-29 MED ORDER — AMOXICILLIN-POT CLAVULANATE 875-125 MG PO TABS: 1.0000 | ORAL_TABLET | Freq: Two times a day (BID) | ORAL | Status: DC

## 2013-12-29 NOTE — Patient Instructions (Signed)

## 2013-12-29 NOTE — Assessment & Plan Note (Signed)
Well controlled. Continue current regimen. Follow up in  6 months.  

## 2013-12-29 NOTE — Assessment & Plan Note (Signed)
And [place] current regimen. Due for repeat liver enzymes today  Lab Results  Component Value Date   CHOL 230* 05/31/2013   HDL 48 05/31/2013   LDLCALC 148* 05/31/2013   TRIG 169* 05/31/2013   CHOLHDL 4.8 05/31/2013

## 2013-12-29 NOTE — Progress Notes (Signed)
   Subjective:    Patient ID: Martha Perkins, female    DOB: Jul 10, 1945, 68 y.o.   MRN: [id number]  Hypertension   Hypertension- Pt denies chest pain, SOB, dizziness, or heart palpitations.  Taking meds as directed w/o problems.  Denies medication side effects.    Hyperlipidemia - On simvastatin 40mg  an tolerating well without S.E.   Elevated liver enzymes - due to recheck today.   Has had a couple of episodes of several epigastric pain and had to go gt Mylanta.  Has also had some pain in her RUQ as well.  Had [place] of [place] years ago and was negative.  Not sure if specific trigggers.  Says not sure if felt nauseated with the episode or not. Started a couple of months ago.  She is on Nexium daily.    Last December had a sinus infection and she wasn't getting beter and she came her. We gave hr second round of antibiotic and got some better but since then has had the thick drainge with bad odor.  Says it chokes her at times.  Says feels like has bad breath.   Review of Systems     Objective:   Physical Exam  Constitutional: She is oriented to person, place, and time. She appears well-developed and well-nourished.  HENT:  Head: Normocephalic and atraumatic.  Cardiovascular: Normal rate, regular rhythm and normal heart sounds.   Pulmonary/Chest: Effort normal and breath sounds normal.  Neurological: She is alert and oriented to person, place, and time.  Skin: Skin is warm and dry.  Psychiatric: She has a normal mood and affect. Her behavior is normal.          Assessment & Plan:   Chronic sinusitis - discussed 3 different options. We did treat her for chronic sinusitis with a one-month course of antibiotics or get a CT of the sinuses, or refer to ENT. She would prefer to have the course of antibiotic treatment. If not better after one month and consider CT or ENT referral.  elavted liver enzymes- due to repeat liver enzymes today.

## 2013-12-29 NOTE — Assessment & Plan Note (Signed)
She did see the surgeon and they recommended surgery. She really doesn't want to do that right now, and she would prefer an injection. I recommended that she see my partner, or sports medicine doctor, Dr. Aundria Mems. She says she will think about it. For now she will continue the meloxicam and also give her prescription for tramadol to use at bedtime when her pain is more severe. Can you make sure that she's using her meloxicam with food and water.

## 2013-12-30 NOTE — Progress Notes (Signed)
Quick Note:  All labs are normal. ______ 

## 2014-01-04 DIAGNOSIS — H538 Other visual disturbances: Secondary | ICD-10-CM | POA: Diagnosis not present

## 2014-01-04 DIAGNOSIS — H2512 Age-related nuclear cataract, left eye: Secondary | ICD-10-CM | POA: Diagnosis not present

## 2014-01-16 DIAGNOSIS — Z79899 Other long term (current) drug therapy: Secondary | ICD-10-CM | POA: Diagnosis not present

## 2014-01-16 DIAGNOSIS — H524 Presbyopia: Secondary | ICD-10-CM | POA: Diagnosis not present

## 2014-01-16 DIAGNOSIS — H5200 Hypermetropia, unspecified eye: Secondary | ICD-10-CM | POA: Diagnosis not present

## 2014-01-16 DIAGNOSIS — H269 Unspecified cataract: Secondary | ICD-10-CM | POA: Diagnosis not present

## 2014-01-16 DIAGNOSIS — Z961 Presence of intraocular lens: Secondary | ICD-10-CM | POA: Diagnosis not present

## 2014-01-16 DIAGNOSIS — H43391 Other vitreous opacities, right eye: Secondary | ICD-10-CM | POA: Diagnosis not present

## 2014-01-16 DIAGNOSIS — H2511 Age-related nuclear cataract, right eye: Secondary | ICD-10-CM | POA: Diagnosis not present

## 2014-01-16 DIAGNOSIS — E780 Pure hypercholesterolemia: Secondary | ICD-10-CM | POA: Diagnosis not present

## 2014-01-16 DIAGNOSIS — I10 Essential (primary) hypertension: Secondary | ICD-10-CM | POA: Diagnosis not present

## 2014-01-16 DIAGNOSIS — K219 Gastro-esophageal reflux disease without esophagitis: Secondary | ICD-10-CM | POA: Diagnosis not present

## 2014-01-16 DIAGNOSIS — H538 Other visual disturbances: Secondary | ICD-10-CM | POA: Diagnosis not present

## 2014-01-16 DIAGNOSIS — H2512 Age-related nuclear cataract, left eye: Secondary | ICD-10-CM | POA: Diagnosis not present

## 2014-01-16 DIAGNOSIS — M1990 Unspecified osteoarthritis, unspecified site: Secondary | ICD-10-CM | POA: Diagnosis not present

## 2014-01-16 DIAGNOSIS — H52209 Unspecified astigmatism, unspecified eye: Secondary | ICD-10-CM | POA: Diagnosis not present

## 2014-01-16 DIAGNOSIS — H5210 Myopia, unspecified eye: Secondary | ICD-10-CM | POA: Diagnosis not present

## 2014-02-01 ENCOUNTER — Telehealth: Payer: Self-pay | Admitting: *Deleted

## 2014-02-01 DIAGNOSIS — J329 Chronic sinusitis, unspecified: Secondary | ICD-10-CM

## 2014-02-01 NOTE — Telephone Encounter (Signed)
Pt called and reports that she is still having sinus pain and pressure and drainage. CT and ENT referral placed.Martha Perkins [place]

## 2014-02-13 ENCOUNTER — Other Ambulatory Visit: Payer: Self-pay

## 2014-02-14 ENCOUNTER — Ambulatory Visit (INDEPENDENT_AMBULATORY_CARE_PROVIDER_SITE_OTHER): Payer: Medicare Other

## 2014-02-14 ENCOUNTER — Other Ambulatory Visit: Payer: Self-pay | Admitting: Family Medicine

## 2014-02-14 DIAGNOSIS — J338 Other polyp of sinus: Secondary | ICD-10-CM

## 2014-02-14 DIAGNOSIS — J329 Chronic sinusitis, unspecified: Secondary | ICD-10-CM

## 2014-02-20 ENCOUNTER — Other Ambulatory Visit: Payer: Self-pay | Admitting: Family Medicine

## 2014-02-21 DIAGNOSIS — J300 Vasomotor rhinitis: Secondary | ICD-10-CM | POA: Diagnosis not present

## 2014-03-06 ENCOUNTER — Other Ambulatory Visit: Payer: Self-pay | Admitting: Family Medicine

## 2014-03-23 ENCOUNTER — Other Ambulatory Visit: Payer: Self-pay | Admitting: Family Medicine

## 2014-03-30 ENCOUNTER — Encounter: Payer: Self-pay | Admitting: Family Medicine

## 2014-03-30 ENCOUNTER — Ambulatory Visit (INDEPENDENT_AMBULATORY_CARE_PROVIDER_SITE_OTHER): Payer: Medicare Other | Admitting: Family Medicine

## 2014-03-30 VITALS — BP 112/72 | HR 85 | Ht 62.0 in | Wt 194.0 lb

## 2014-03-30 DIAGNOSIS — L7211 Pilar cyst: Secondary | ICD-10-CM | POA: Diagnosis not present

## 2014-03-30 DIAGNOSIS — R300 Dysuria: Secondary | ICD-10-CM | POA: Diagnosis not present

## 2014-03-30 DIAGNOSIS — N3000 Acute cystitis without hematuria: Secondary | ICD-10-CM

## 2014-03-30 LAB — POCT URINALYSIS DIPSTICK
Bilirubin, UA: NEGATIVE
Glucose, UA: NEGATIVE
Ketones, UA: NEGATIVE
NITRITE UA: NEGATIVE
PH UA: 6.5
Protein, UA: NEGATIVE
Spec Grav, UA: 1.015
UROBILINOGEN UA: 0.2

## 2014-03-30 MED ORDER — MUPIROCIN 2 % EX OINT: TOPICAL_OINTMENT | Freq: Two times a day (BID) | CUTANEOUS | Status: DC

## 2014-03-30 MED ORDER — CIPROFLOXACIN HCL 500 MG PO TABS: 500.0000 mg | ORAL_TABLET | Freq: Two times a day (BID) | ORAL | Status: AC

## 2014-03-30 MED ORDER — PHENAZOPYRIDINE HCL 100 MG PO TABS: 100.0000 mg | ORAL_TABLET | Freq: Three times a day (TID) | ORAL | Status: DC | PRN

## 2014-03-30 NOTE — Progress Notes (Signed)
   Subjective:    Patient ID: Martha Perkins, female    DOB: 1945/04/26, 69 y.o.   MRN: [id number]  HPI About 3 days of pelvic pain and cramping after urination.  Has some heaviness in the pelvic area.  No blood in the urine.  No fever, chills or sweats.  No back pain with it.  Hx of kidney stones. Had lithotripsy in 2007/8. Hx of external urethrobplasty    she gets recurrent sebaceous cysts on her scalp. She says sometimes when they get larger they'll open up and drain on her own. She has one on the right side of her scalp that she says started draining on its own a few days ago. She went to see her hairdresser unfortunately it was bone several times with the home. Since then it has been tender and he still continued to drain. No fevers chills or sweats. It's very sensitive to touch. Review of Systems     Objective:   Physical Exam  Constitutional: She is oriented to person, place, and time. She appears well-developed and well-nourished.  HENT:  Head: Normocephalic and atraumatic.  Musculoskeletal:  No CVA tenderness.   Neurological: She is alert and oriented to person, place, and time.  Skin: Skin is warm and dry.  Psychiatric: She has a normal mood and affect. Her behavior is normal.       She has an approximately 2 cm scab with a little bit of yellow drainage on the right side of the scalp. No surrounding erythema.     Assessment & Plan:  Dysuria-urinalysis performed today. Will treat with ciprofloxacin. If not significantly better after 3 days and give the office a call back. A  Kidney stone is still a possibility. But her symptoms are slightly different than previous. Make sure drinking plenty of water. Also called in a perception for peridium.   inflamed sebaceous cyst-it actually looks like it is trying to heal. Will treat with topical mupirocin ointment. If still draining after 5-7 days and give the office a call.

## 2014-03-30 NOTE — Addendum Note (Signed)
Addended by: Teddy Spike on: 03/30/2014 04:28 PM   Modules accepted: Orders

## 2014-03-30 NOTE — Patient Instructions (Signed)

## 2014-04-01 LAB — URINE CULTURE

## 2014-04-06 ENCOUNTER — Other Ambulatory Visit: Payer: Self-pay | Admitting: Family Medicine

## 2014-04-06 NOTE — Telephone Encounter (Signed)
F/u appt before future refills

## 2014-04-17 ENCOUNTER — Telehealth: Payer: Self-pay | Admitting: *Deleted

## 2014-04-17 MED ORDER — CEPHALEXIN 500 MG PO CAPS: 500.0000 mg | ORAL_CAPSULE | Freq: Four times a day (QID) | ORAL | Status: DC

## 2014-04-17 NOTE — Telephone Encounter (Signed)
Pt stated that she has been using the ointment for the cyst and it hasn't gotten any worse or better and wanted to know what she should do. Will fwd to pcp for advice.Martha Perkins [place]

## 2014-04-17 NOTE — Telephone Encounter (Signed)
I will call in oral antibiotic. This should help control any infection but may ultinately have to have the cyst removed. We can do here in the office if needed.

## 2014-04-18 NOTE — Telephone Encounter (Signed)
Pt informed.Martha Perkins  

## 2014-05-10 ENCOUNTER — Other Ambulatory Visit: Payer: Self-pay | Admitting: Family Medicine

## 2014-05-15 ENCOUNTER — Other Ambulatory Visit: Payer: Self-pay | Admitting: Family Medicine

## 2014-05-16 ENCOUNTER — Ambulatory Visit: Payer: Self-pay | Admitting: Physician Assistant

## 2014-05-17 ENCOUNTER — Encounter: Payer: Self-pay | Admitting: Physician Assistant

## 2014-05-17 ENCOUNTER — Ambulatory Visit (INDEPENDENT_AMBULATORY_CARE_PROVIDER_SITE_OTHER): Payer: Medicare Other | Admitting: Physician Assistant

## 2014-05-17 VITALS — BP 122/69 | HR 100 | Temp 97.9°F | Ht 62.0 in | Wt 193.0 lb

## 2014-05-17 DIAGNOSIS — J4521 Mild intermittent asthma with (acute) exacerbation: Secondary | ICD-10-CM

## 2014-05-17 DIAGNOSIS — J0100 Acute maxillary sinusitis, unspecified: Secondary | ICD-10-CM

## 2014-05-17 DIAGNOSIS — J309 Allergic rhinitis, unspecified: Secondary | ICD-10-CM | POA: Diagnosis not present

## 2014-05-17 NOTE — Progress Notes (Signed)
   Subjective:    Patient ID: Martha Perkins, female    DOB: 12/25/1945, 69 y.o.   MRN: [id number]  HPI Patient is a 69 year old female who presents to the clinic with bilateral ear pain, sinus pressure and nasal congestion and rhinorrhea. She was seen by ENT in December for chronic postnasal drip. There was no infection on that particular exam. He suspected most of this was allergy related. She declined allergy testing. She's been using salt water sinus rinses since. With Claritin. That does seem to help. On Sunday morning she woke up with sudden worsening of symptoms. She felt tremendous head pressure and sinus pressure. Her cough is a yellowish discharge. She does feel like she is having some wheezing. She's tried Sudafed and Robitussin. These do not seem to be helping. She always feels like her chest hurts.     Review of Systems  All other systems reviewed and are negative.      Objective:   Physical Exam  Constitutional: She is oriented to person, place, and time. She appears well-developed and well-nourished.  HENT:  Head: Normocephalic and atraumatic.  Right Ear: External ear normal.  Left Ear: External ear normal.  TM's clear bilaterally.   Oropharynx erythematous with PND. No tonsilar swelling or exudate.   Bilateral maxillary tenderness to palpation.   Bilateral nasal turbinates red and swollen.   Eyes: Conjunctivae are normal.  Neck: Normal range of motion. Neck supple.  Cardiovascular: Regular rhythm and normal heart sounds.   Tachycardia at 100.   Pulmonary/Chest: Effort normal.  Bilateral expiratory wheezing bilaterally.   Lymphadenopathy:    She has no cervical adenopathy.  Neurological: She is alert and oriented to person, place, and time.  Skin: Skin is dry.  Psychiatric: She has a normal mood and affect. Her behavior is normal.          Assessment & Plan:  Acute sinusitis/chronic allergic rhinitis/reactive airway disease- could be more allergic than  bacterial. Will treat with prednisone and zpak. Continue nasal saline wash. Certainly some wheezing on exam. Prednisone should help with inflammation. Rescue inhaler to use as needed ever 2-4 hours.   Fyi: power was out during exam. Manually wrote prescriptions.

## 2014-05-25 ENCOUNTER — Encounter: Payer: Self-pay | Admitting: Family Medicine

## 2014-05-25 ENCOUNTER — Ambulatory Visit (INDEPENDENT_AMBULATORY_CARE_PROVIDER_SITE_OTHER): Payer: Medicare Other | Admitting: Family Medicine

## 2014-05-25 VITALS — BP 143/78 | HR 78 | Temp 98.0°F | Wt 195.0 lb

## 2014-05-25 DIAGNOSIS — J4520 Mild intermittent asthma, uncomplicated: Secondary | ICD-10-CM

## 2014-05-25 MED ORDER — HYDROCODONE-HOMATROPINE 5-1.5 MG/5ML PO SYRP: 5.0000 mL | ORAL_SOLUTION | Freq: Three times a day (TID) | ORAL | Status: DC | PRN

## 2014-05-25 MED ORDER — PREDNISONE 20 MG PO TABS: ORAL_TABLET | ORAL | Status: AC

## 2014-05-25 NOTE — Progress Notes (Signed)
CC: Martha Perkins is a 69 y.o. female is here for Cough   Subjective: HPI:  On Monday of last week patient began to have facial pressure, nasal congestion postnasal drip and cough with wheezing. She began a five-day course of prednisone 50 mg and a Z-Pak on Wednesday and had resolution of her congestion and facial pressure and postnasal drip by the weekend. Since running out of his medication she's had a persistent nonproductive cough with wheezing. Symptoms are worse in the night. She's having difficulty staying and falling asleep because of these symptoms. She's had shortness of breath with exertion but not with rest. Interventions have included albuterol which slightly improves her persistent symptoms which are still moderate in severity. Denies fevers, chills, chest pain, confusion, facial pressure, nasal congestion, sore throat, nor rashes.   Review Of Systems Outlined In HPI  Past Medical History  Diagnosis Date  . Hypertension   . Hyperlipidemia   . Thyroid disease   . Kidney stone   . Depression     Past Surgical History  Procedure Laterality Date  . Thyroidectomy  1971  . Tubal ligation  1979  . Replacement total knee bilateral  2011  . Lithotripsy    . Cataract extraction Left 09/2012    Dr. Bunnie Philips   Family History  Problem Relation Age of Onset  . Coronary artery disease Mother 56    deceased  . Depression Mother   . Diabetes Paternal Grandmother   . Hyperlipidemia Mother     History   Social History  . Marital Status: Married    Spouse Name: N/A  . Number of Children: N/A  . Years of Education: N/A   Occupational History  . Retired Therapist, sports.     Social History Main Topics  . Smoking status: Never Smoker   . Smokeless tobacco: Not on file  . Alcohol Use: No  . Drug Use: No  . Sexual Activity: Not on file   Other Topics Concern  . Not on file   Social History Narrative     Objective: BP 143/78 mmHg  Pulse 78  Temp(Src) 98 F (36.7 C) (Oral)   Wt 195 lb (88.451 kg)  SpO2 97%  General: Alert and Oriented, No Acute Distress HEENT: Pupils equal, round, reactive to light. Conjunctivae clear.  External ears unremarkable, canals clear with intact TMs with appropriate landmarks.  Middle ear appears open without effusion. Pink inferior turbinates.  Moist mucous membranes, pharynx without inflammation nor lesions.  Neck supple without palpable lymphadenopathy nor abnormal masses. Lungs: Comfortable work of breathing with mild end expiratory wheezing in all lung fields, no rhonchi nor rales Cardiac: Regular rate and rhythm. Normal S1/S2.  No murmurs, rubs, nor gallops.   Extremities: No peripheral edema.  Strong peripheral pulses.  Mental Status: No depression, anxiety, nor agitation. Skin: Warm and dry.  Assessment & Plan: Martha Perkins was seen today for cough.  Diagnoses and all orders for this visit:  Reactive airway disease, mild intermittent, uncomplicated  Other orders -     HYDROcodone-homatropine (HYCODAN) 5-1.5 MG/5ML syrup; Take 5 mLs by mouth every 8 (eight) hours as needed for cough. -     predniSONE (DELTASONE) 20 MG tablet; Three tabs daily days 1-3, two tabs daily days 4-6, one tab daily days 7-9, half tab daily days 10-13.  Persistent wheezing from reactive airway disease that has not fully subsided. Restart prednisone but begin a prednisone taper this time instead of burst. Provide with Hycodan to help with sleep.  No indication for antibiotics at this time  Return if symptoms worsen or fail to improve.

## 2014-06-15 ENCOUNTER — Other Ambulatory Visit: Payer: Self-pay | Admitting: Family Medicine

## 2014-07-03 ENCOUNTER — Ambulatory Visit (INDEPENDENT_AMBULATORY_CARE_PROVIDER_SITE_OTHER): Payer: Medicare Other | Admitting: Family Medicine

## 2014-07-03 ENCOUNTER — Encounter: Payer: Self-pay | Admitting: Family Medicine

## 2014-07-03 VITALS — BP 115/60 | HR 81 | Ht 62.0 in | Wt 199.0 lb

## 2014-07-03 DIAGNOSIS — N63 Unspecified lump in breast: Secondary | ICD-10-CM

## 2014-07-03 DIAGNOSIS — I10 Essential (primary) hypertension: Secondary | ICD-10-CM

## 2014-07-03 DIAGNOSIS — E785 Hyperlipidemia, unspecified: Secondary | ICD-10-CM | POA: Diagnosis not present

## 2014-07-03 DIAGNOSIS — Z0000 Encounter for general adult medical examination without abnormal findings: Secondary | ICD-10-CM

## 2014-07-03 DIAGNOSIS — N6320 Unspecified lump in the left breast, unspecified quadrant: Secondary | ICD-10-CM

## 2014-07-03 DIAGNOSIS — R7309 Other abnormal glucose: Secondary | ICD-10-CM

## 2014-07-03 DIAGNOSIS — N6325 Unspecified lump in the left breast, overlapping quadrants: Secondary | ICD-10-CM

## 2014-07-03 DIAGNOSIS — Z1159 Encounter for screening for other viral diseases: Secondary | ICD-10-CM

## 2014-07-03 DIAGNOSIS — Z6836 Body mass index (BMI) 36.0-36.9, adult: Secondary | ICD-10-CM

## 2014-07-03 LAB — COMPLETE METABOLIC PANEL WITH GFR
ALBUMIN: 4.4 g/dL (ref 3.5–5.2)
ALT: 42 U/L — ABNORMAL HIGH (ref 0–35)
AST: 29 U/L (ref 0–37)
Alkaline Phosphatase: 53 U/L (ref 39–117)
BILIRUBIN TOTAL: 1.1 mg/dL (ref 0.2–1.2)
BUN: 20 mg/dL (ref 6–23)
CHLORIDE: 105 meq/L (ref 96–112)
CO2: 25 mEq/L (ref 19–32)
Calcium: 9.7 mg/dL (ref 8.4–10.5)
Creat: 0.66 mg/dL (ref 0.50–1.10)
Glucose, Bld: 95 mg/dL (ref 70–99)
Potassium: 4 mEq/L (ref 3.5–5.3)
SODIUM: 139 meq/L (ref 135–145)
Total Protein: 6.8 g/dL (ref 6.0–8.3)

## 2014-07-03 LAB — LIPID PANEL
CHOL/HDL RATIO: 5.3 ratio
CHOLESTEROL: 203 mg/dL — AB (ref 0–200)
HDL: 38 mg/dL — ABNORMAL LOW (ref >=46)
LDL Cholesterol: 130 mg/dL — ABNORMAL HIGH (ref 0–99)
Triglycerides: 175 mg/dL — ABNORMAL HIGH (ref <150)
VLDL: 35 mg/dL (ref 0–40)

## 2014-07-03 LAB — HEMOGLOBIN A1C
Hgb A1c MFr Bld: 6.2 % — ABNORMAL HIGH (ref <5.7)
Mean Plasma Glucose: 131 mg/dL — ABNORMAL HIGH (ref <117)

## 2014-07-03 LAB — TSH: TSH: 1.395 u[IU]/mL (ref 0.350–4.500)

## 2014-07-03 MED ORDER — AZELASTINE HCL 0.15 % NA SOLN: 1.0000 | Freq: Two times a day (BID) | NASAL | Status: DC

## 2014-07-03 NOTE — Progress Notes (Signed)
Subjective:    Patient ID: Martha Perkins, female    DOB: 07-13-1945, 69 y.o.   MRN: [id number]  HPI    Review of Systems     Objective:   Physical Exam        Assessment & Plan:    Subjective:    Martha Perkins is a 69 y.o. female who presents for Medicare Initial preventive examination.  Preventive Screening-Counseling & Management  Tobacco History  Smoking status  . Never Smoker   Smokeless tobacco  . Not on file     Problems Prior to Visit 1.   Current Problems (verified) Patient Active Problem List   Diagnosis Date Noted  . Chronic allergic rhinitis 05/17/2014  . Injury of right rotator cuff 12/29/2013  . Cataract 06/03/2012  . Hyperlipidemia LDL goal <100 12/21/2010  . Hypertension 12/21/2010  . GERD (gastroesophageal reflux disease) 07/18/2010  . Insulin resistance 07/18/2010  . Hyperlipidemia 07/18/2010  . Fatty liver 07/18/2010    Medications Prior to Visit Current Outpatient Prescriptions on File Prior to Visit  Medication Sig Dispense Refill  . albuterol (PROAIR HFA) 108 (90 BASE) MCG/ACT inhaler Inhale 2 puffs into the lungs every 6 (six) hours as needed for wheezing or shortness of breath. 18 each 0  . esomeprazole (NEXIUM) 40 MG capsule Take 1 capsule (40 mg total) by mouth daily. 30 capsule 6  . fluticasone (FLONASE) 50 MCG/ACT nasal spray One spray in each nostril twice a day, use left hand for right nostril, and right hand for left nostril. 48 g 3  . HYDROcodone-homatropine (HYCODAN) 5-1.5 MG/5ML syrup Take 5 mLs by mouth every 8 (eight) hours as needed for cough. 120 mL 0  . ketoconazole (NIZORAL) 2 % cream Apply topically daily. 15 g 0  . lisinopril-hydrochlorothiazide (PRINZIDE,ZESTORETIC) 10-12.5 MG per tablet Take 1 tablet by mouth daily. 90 tablet 1  . meloxicam (MOBIC) 15 MG tablet TAKE ONE TABLET BY MOUTH ONCE DAILY 30 tablet 3  . mupirocin ointment (BACTROBAN) 2 % Apply topically 2 (two) times daily. 30 g 0  . simvastatin (ZOCOR)  40 MG tablet Take 1 tablet (40 mg total) by mouth at bedtime. 90 tablet 3  . traMADol (ULTRAM) 50 MG tablet TAKE ONE TABLET BY MOUTH AT BEDTIME AS NEEDED FOR SEVERE PAIN 30 tablet 0   No current facility-administered medications on file prior to visit.    Current Medications (verified) Current Outpatient Prescriptions  Medication Sig Dispense Refill  . albuterol (PROAIR HFA) 108 (90 BASE) MCG/ACT inhaler Inhale 2 puffs into the lungs every 6 (six) hours as needed for wheezing or shortness of breath. 18 each 0  . Azelastine HCl 0.15 % SOLN Place 1 spray into the nose 2 (two) times daily. 30 mL 3  . esomeprazole (NEXIUM) 40 MG capsule Take 1 capsule (40 mg total) by mouth daily. 30 capsule 6  . fluticasone (FLONASE) 50 MCG/ACT nasal spray One spray in each nostril twice a day, use left hand for right nostril, and right hand for left nostril. 48 g 3  . HYDROcodone-homatropine (HYCODAN) 5-1.5 MG/5ML syrup Take 5 mLs by mouth every 8 (eight) hours as needed for cough. 120 mL 0  . ketoconazole (NIZORAL) 2 % cream Apply topically daily. 15 g 0  . lisinopril-hydrochlorothiazide (PRINZIDE,ZESTORETIC) 10-12.5 MG per tablet Take 1 tablet by mouth daily. 90 tablet 1  . meloxicam (MOBIC) 15 MG tablet TAKE ONE TABLET BY MOUTH ONCE DAILY 30 tablet 3  . mupirocin ointment (BACTROBAN) 2 % Apply  topically 2 (two) times daily. 30 g 0  . simvastatin (ZOCOR) 40 MG tablet Take 1 tablet (40 mg total) by mouth at bedtime. 90 tablet 3  . traMADol (ULTRAM) 50 MG tablet TAKE ONE TABLET BY MOUTH AT BEDTIME AS NEEDED FOR SEVERE PAIN 30 tablet 0   No current facility-administered medications for this visit.     Allergies (verified) Demerol; Meperidine hcl; Oxycodone-aspirin; and Percocet   PAST HISTORY  Family History Family History  Problem Relation Age of Onset  . Coronary artery disease Mother 37    deceased  . Depression Mother   . Diabetes Paternal Grandmother   . Hyperlipidemia Mother     Social  History History  Substance Use Topics  . Smoking status: Never Smoker   . Smokeless tobacco: Not on file  . Alcohol Use: No     Are there smokers in your home (other than you)? No  Risk Factors Current exercise habits: walks about 3 days per week.   Dietary issues discussed: She wants to lose weight.     Cardiac risk factors: advanced age (older than 37 for men, 5 for women), dyslipidemia, hypertension and sedentary lifestyle.  Depression Screen (Note: if answer to either of the following is "Yes", a more complete depression screening is indicated)   Over the past 2 weeks, have you felt down, depressed or hopeless? No  Over the past 2 weeks, have you felt little interest or pleasure in doing things? No  Have you lost interest or pleasure in daily life? No  Do you often feel hopeless? No  Do you cry easily over simple problems? No  Activities of Daily Living In your present state of health, do you have any difficulty performing the following activities?:  Driving? No Managing money?  No Feeding yourself? No Getting from bed to chair? No  Climbing a flight of stairs? No Preparing food and eating?: No Bathing or showering? No Getting dressed: No Getting to the toilet? No Using the toilet:No Moving around from place to place: No In the past year have you fallen or had a near fall?:No   Hearing Difficulties: No Do you often ask people to speak up or repeat themselves? No Do you experience ringing or noises in your ears? No Do you have difficulty understanding soft or whispered voices? No   Do you feel that you have a problem with memory? No  Do you often misplace items? No  Do you feel safe at home?  Yes  Cognitive Testing  Alert? Yes  Normal Appearance?Yes  Oriented to person? Yes  Place? Yes   Time? Yes  Recall of three objects?  Yes  Can perform simple calculations? Yes  Displays appropriate judgment?Yes  Can read the correct time from a watch face?Yes   6 CIt  score of 0/28.    Advanced Directives have been discussed with the patient? No  List the Names of Other Physician/Practitioners you currently use: 1.    Indicate any recent Medical Services you may have received from other than [facility] providers in the past year (date may be approximate).  Immunization History  Administered Date(s) Administered  . Influenza Split 01/27/2012  . Influenza,inj,Quad PF,36+ Mos 11/26/2012, 12/29/2013  . Pneumococcal Conjugate-13 12/29/2013  . Pneumococcal Polysaccharide-23 11/26/2012    Screening Tests Health Maintenance  Topic Date Due  . Hepatitis C Screening  09-27-1945  . COLONOSCOPY  03/11/2011  . DEXA SCAN  09/11/2012  . INFLUENZA VACCINE  10/09/2014  . TETANUS/TDAP  03/11/2015  . MAMMOGRAM  12/30/2015  . ZOSTAVAX  Completed  . PNA vac Low Risk Adult  Completed    All answers were reviewed with the patient and necessary referrals were made:  METHENEY,CATHERINE, MD   07/03/2014   History reviewed: allergies, current medications, past family history, past medical history, past social history, past surgical history and problem list  Review of Systems A comprehensive review of systems was negative.    Objective:     Vision by Snellen chart: eye exam is UtD.   Body mass index is 36.39 kg/(m^2). BP 115/60 mmHg  Pulse 81  Ht 5\' 2"  (1.575 m)  Wt 199 lb (90.266 kg)  BMI 36.39 kg/m2  BP 115/60 mmHg  Pulse 81  Ht 5\' 2"  (1.575 m)  Wt 199 lb (90.266 kg)  BMI 36.39 kg/m2  General Appearance:    Alert, cooperative, no distress, appears stated age  Head:    Normocephalic, without obvious abnormality, atraumatic  Eyes:    PERRL, conjunctiva/corneas clear, EOM's intact, both eyes  Ears:    Normal TM's and external ear canals, both ears  Nose:   Nares normal, septum midline, mucosa normal, no drainage    or sinus tenderness  Throat:   Lips, mucosa, and tongue normal; teeth and gums normal  Neck:   Supple, symmetrical, trachea midline, no  adenopathy;    thyroid:  no enlargement/tenderness/nodules; no carotid   bruit or JVD  Back:     Symmetric, no curvature, ROM normal, no CVA tenderness  Lungs:     Clear to auscultation bilaterally, respirations unlabored  Chest Wall:    No tenderness or deformity   Heart:    Regular rate and rhythm, S1 and S2 normal, no murmur, rub   or gallop  Breast Exam:    right breast is normal with no palpable lesions or tenderness or retraction the nipple. On the left breast she has a fullness in the medial side of the breast at the 9:00 position. No skin changes superficially.  Abdomen:     Soft, non-tender, bowel sounds active all four quadrants,    no masses, no organomegaly  Genitalia:    Normal female with no vaginal lesion.  Vaginal atrophy present, non abnl d/c.   Rectal:    Not performed.   Extremities:   Extremities normal, atraumatic, no cyanosis or edema  Pulses:   2+ and symmetric all extremities  Skin:   Skin color, texture, turgor normal, no rashes or lesions  Lymph nodes:   Cervical, supraclavicular, and axillary nodes normal  Neurologic:   CNII-XII intact, normal strength, sensation and reflexes    throughout       Assessment:     Medicare annual wellness exam.      Plan:     During the course of the visit the patient was educated and counseled about appropriate screening and preventive services including:    Screening Pap smear and pelvic exam    Screen hepatitis C  Screen HIV  Palpable thickened area in the breast tissue on the left side at the 9:00 position. Mammogram up-to-date so recommend diagnostic of the left with possible ultrasound.  Obesity/BMI 33-recommend referral to nutrition therapy. Also recommend the smart phone application called my fitness pal to help her set caloric goals. Encouraged regular exercise which she is not doing at this time.  Diet review for nutrition referral? Yes ____  Not Indicated __X_   Patient Instructions (the written plan)  was given to  the patient.  Medicare Attestation I have personally reviewed: The patient's medical and social history Their use of alcohol, tobacco or illicit drugs Their current medications and supplements The patient's functional ability including ADLs,fall risks, home safety risks, cognitive, and hearing and visual impairment Diet and physical activities Evidence for depression or mood disorders  The patient's weight, height, BMI, and visual acuity have been recorded in the chart.  I have made referrals, counseling, and provided education to the patient based on review of the above and I have provided the patient with a written personalized care plan for preventive services.     METHENEY,CATHERINE, MD   07/03/2014

## 2014-07-03 NOTE — Patient Instructions (Signed)
My Fitness Pal  To help keep track of calories.

## 2014-07-04 LAB — HEPATITIS C ANTIBODY: HCV AB: NEGATIVE

## 2014-07-05 ENCOUNTER — Other Ambulatory Visit: Payer: Self-pay | Admitting: Family Medicine

## 2014-07-05 MED ORDER — ATORVASTATIN CALCIUM 40 MG PO TABS: 40.0000 mg | ORAL_TABLET | Freq: Every day | ORAL | Status: DC

## 2014-07-06 ENCOUNTER — Other Ambulatory Visit: Payer: Self-pay | Admitting: *Deleted

## 2014-07-06 DIAGNOSIS — J0100 Acute maxillary sinusitis, unspecified: Secondary | ICD-10-CM

## 2014-07-06 MED ORDER — LISINOPRIL-HYDROCHLOROTHIAZIDE 10-12.5 MG PO TABS: 1.0000 | ORAL_TABLET | Freq: Every day | ORAL | Status: DC

## 2014-07-06 MED ORDER — MELOXICAM 15 MG PO TABS: 15.0000 mg | ORAL_TABLET | Freq: Every day | ORAL | Status: DC

## 2014-07-06 MED ORDER — FLUTICASONE PROPIONATE 50 MCG/ACT NA SUSP: NASAL | Status: DC

## 2014-07-17 ENCOUNTER — Other Ambulatory Visit: Payer: Self-pay | Admitting: Sports Medicine

## 2014-07-20 DIAGNOSIS — Z961 Presence of intraocular lens: Secondary | ICD-10-CM | POA: Diagnosis not present

## 2014-07-25 ENCOUNTER — Encounter (INDEPENDENT_AMBULATORY_CARE_PROVIDER_SITE_OTHER): Payer: Self-pay

## 2014-07-25 ENCOUNTER — Ambulatory Visit
Admission: RE | Admit: 2014-07-25 | Discharge: 2014-07-25 | Disposition: A | Discharge status: Home / Self Care | Payer: Medicare Other | Source: Ambulatory Visit | Attending: Family Medicine | Admitting: Family Medicine

## 2014-07-25 ENCOUNTER — Other Ambulatory Visit: Payer: Self-pay | Admitting: Family Medicine

## 2014-07-25 DIAGNOSIS — N6325 Unspecified lump in the left breast, overlapping quadrants: Secondary | ICD-10-CM

## 2014-07-25 DIAGNOSIS — N6320 Unspecified lump in the left breast, unspecified quadrant: Principal | ICD-10-CM

## 2014-07-25 DIAGNOSIS — N63 Unspecified lump in breast: Secondary | ICD-10-CM | POA: Diagnosis not present

## 2014-07-31 ENCOUNTER — Encounter: Payer: Self-pay | Admitting: Family Medicine

## 2014-08-04 ENCOUNTER — Ambulatory Visit (INDEPENDENT_AMBULATORY_CARE_PROVIDER_SITE_OTHER): Payer: Medicare Other | Admitting: Family Medicine

## 2014-08-04 ENCOUNTER — Encounter: Payer: Self-pay | Admitting: Family Medicine

## 2014-08-04 VITALS — BP 131/79 | HR 85 | Temp 98.0°F | Wt 198.0 lb

## 2014-08-04 DIAGNOSIS — J45901 Unspecified asthma with (acute) exacerbation: Secondary | ICD-10-CM | POA: Diagnosis not present

## 2014-08-04 MED ORDER — PREDNISONE 20 MG PO TABS: ORAL_TABLET | ORAL | Status: AC

## 2014-08-04 MED ORDER — HYDROCODONE-HOMATROPINE 5-1.5 MG/5ML PO SYRP: 5.0000 mL | ORAL_SOLUTION | Freq: Three times a day (TID) | ORAL | Status: DC | PRN

## 2014-08-04 MED ORDER — ALBUTEROL SULFATE HFA 108 (90 BASE) MCG/ACT IN AERS: 2.0000 | INHALATION_SPRAY | Freq: Four times a day (QID) | RESPIRATORY_TRACT | Status: DC | PRN

## 2014-08-04 NOTE — Progress Notes (Signed)
CC: Martha Perkins is a 69 y.o. female is here for cough since yesterday   Subjective: HPI:  Approximately 36 hours of wheezing and mild shortness of breath overall symptoms are mild in severity. Slightly improved with albuterol. Symptoms began after being exposed to strong perfume at a funeral. Symptoms are interfering with sleep and when she uses leftover Hycodan. Accompanied by new cough described as dry, nonproductive. Denies fevers, chills, nausea, vomiting, facial pain, nasal congestion review of systems positive for sore throat. Symptoms are persistent throughout the day.   Review Of Systems Outlined In HPI  Past Medical History  Diagnosis Date  . Hypertension   . Hyperlipidemia   . Thyroid disease   . Kidney stone   . Depression     Past Surgical History  Procedure Laterality Date  . Thyroidectomy  1971  . Tubal ligation  1979  . Replacement total knee bilateral  2011  . Lithotripsy    . Cataract extraction Left 09/2012    Dr. Bunnie Philips   Family History  Problem Relation Age of Onset  . Coronary artery disease Mother 73    deceased  . Depression Mother   . Diabetes Paternal Grandmother   . Hyperlipidemia Mother     History   Social History  . Marital Status: Married    Spouse Name: N/A  . Number of Children: N/A  . Years of Education: N/A   Occupational History  . Retired Therapist, sports.     Social History Main Topics  . Smoking status: Never Smoker   . Smokeless tobacco: Not on file  . Alcohol Use: No  . Drug Use: No  . Sexual Activity: Not on file   Other Topics Concern  . Not on file   Social History Narrative     Objective: BP 131/79 mmHg  Pulse 85  Temp(Src) 98 F (36.7 C) (Oral)  Wt 198 lb (89.812 kg)  SpO2 95%  General: Alert and Oriented, No Acute Distress HEENT: Pupils equal, round, reactive to light. Conjunctivae clear.  External ears unremarkable, canals clear with intact TMs with appropriate landmarks.  Middle ear appears open without  effusion. Pink inferior turbinates.  Moist mucous membranes, pharynx without inflammation nor lesions.  Neck supple without palpable lymphadenopathy nor abnormal masses. Lungs: Comfortable work of breathing with end expiratory wheezing in the right posterior upper lung field, no rhonchi or rales. Extremities: No peripheral edema.  Strong peripheral pulses.  Mental Status: No depression, anxiety, nor agitation. Skin: Warm and dry.  Assessment & Plan: Jourdan was seen today for cough since yesterday.  Diagnoses and all orders for this visit:  Reactive airway disease with acute exacerbation Orders: -     albuterol (PROAIR HFA) 108 (90 BASE) MCG/ACT inhaler; Inhale 2 puffs into the lungs every 6 (six) hours as needed for wheezing or shortness of breath. -     predniSONE (DELTASONE) 20 MG tablet; Three tabs at once daily for five days.  Other orders -     HYDROcodone-homatropine (HYCODAN) 5-1.5 MG/5ML syrup; Take 5 mLs by mouth every 8 (eight) hours as needed for cough.   Reactive airway disease, acute exacerbation today start prednisone burst given that its so soon into the illness I do not think she needs a taper. I also advised that SHE needs to be on any probiotics for this however if no better after 36 hours of prednisone I invited her to call the answering service for them to get in touch with me this weekend and  I will provide her with doxycycline 100 mg twice a day for 10 days  No Follow-up on file.

## 2014-08-17 ENCOUNTER — Other Ambulatory Visit: Payer: Self-pay | Admitting: Sports Medicine

## 2014-08-24 ENCOUNTER — Other Ambulatory Visit: Payer: Self-pay | Admitting: Family Medicine

## 2014-09-19 ENCOUNTER — Other Ambulatory Visit: Payer: Self-pay | Admitting: Sports Medicine

## 2014-09-20 ENCOUNTER — Telehealth: Payer: Self-pay

## 2014-09-20 NOTE — Telephone Encounter (Signed)
A RX for Tramadol was signed for patient, she was last seen by Dr. Darene Lamer 03/09/2012 and he did not give patient Tramadol during that visit. Please advise patient needs appt  With PCP before anymore Tramadol can be filled or send the request to PCP. Abel Hageman,CMA

## 2014-10-25 ENCOUNTER — Other Ambulatory Visit: Payer: Self-pay | Admitting: Sports Medicine

## 2014-11-29 ENCOUNTER — Other Ambulatory Visit: Payer: Self-pay | Admitting: Sports Medicine

## 2014-12-07 ENCOUNTER — Other Ambulatory Visit: Payer: Self-pay | Admitting: Family Medicine

## 2014-12-14 ENCOUNTER — Encounter: Payer: Self-pay | Admitting: Family Medicine

## 2014-12-14 ENCOUNTER — Ambulatory Visit (INDEPENDENT_AMBULATORY_CARE_PROVIDER_SITE_OTHER): Payer: Medicare Other | Admitting: Family Medicine

## 2014-12-14 VITALS — BP 131/58 | HR 80 | Temp 98.4°F | Ht 62.0 in | Wt 196.0 lb

## 2014-12-14 DIAGNOSIS — Z23 Encounter for immunization: Secondary | ICD-10-CM | POA: Diagnosis not present

## 2014-12-14 DIAGNOSIS — J019 Acute sinusitis, unspecified: Secondary | ICD-10-CM

## 2014-12-14 DIAGNOSIS — L723 Sebaceous cyst: Secondary | ICD-10-CM | POA: Diagnosis not present

## 2014-12-14 MED ORDER — MUPIROCIN 2 % EX OINT: TOPICAL_OINTMENT | Freq: Two times a day (BID) | CUTANEOUS | Status: DC

## 2014-12-14 MED ORDER — AMOXICILLIN-POT CLAVULANATE 875-125 MG PO TABS: 1.0000 | ORAL_TABLET | Freq: Two times a day (BID) | ORAL | Status: DC

## 2014-12-14 NOTE — Patient Instructions (Signed)

## 2014-12-14 NOTE — Progress Notes (Signed)
   Subjective:    Patient ID: Martha Perkins, female    DOB: [birth year], 69 y.o.   MRN: [id number]  HPI Sinus congestion and drainage x 4 weeks. Has been dong her sinus rinses.  Left ear feels plugged and unable to hear.  Right is not bothersome but has been popping. No fevers chills or sweats.   she also has a sebaceous cyst on her scalp but has been there for years but started become painful about 3 days ago. She doesn't remember bumping it or injuring it. He just wants me to look at it make sure that it's not infected. She thinks it may have drained a little bit on her pillow but she's not sure.  Review of Systems     Objective:   Physical Exam  Constitutional: She is oriented to person, place, and time. She appears well-developed and well-nourished.  HENT:  Head: Normocephalic and atraumatic.  Right Ear: External ear normal.  Left Ear: External ear normal.  Nose: Nose normal.  Mouth/Throat: Oropharynx is clear and moist.  Left TM is retracted. No erythema or fluid.     Eyes: Conjunctivae and EOM are normal. Pupils are equal, round, and reactive to light.  Neck: Neck supple. No thyromegaly present.  Cardiovascular: Normal rate, regular rhythm and normal heart sounds.   Pulmonary/Chest: Effort normal and breath sounds normal. She has no wheezes.  Lymphadenopathy:    She has no cervical adenopathy.  Neurological: She is alert and oriented to person, place, and time.  Skin: Skin is warm and dry.  She had a fairly large sebaceous cyst on the top part of her scalp near the crown which had a superficial crust on it. A tick a #5 blade and just gently removed the cross and express the contents. I was able to easily grasp the capsule with forceps and removed them without any difficulty or pain. No surrounding erythema to suspect cellulitis but certainly the lesion was clearly inflamed.  Psychiatric: She has a normal mood and affect.          Assessment & Plan:  Acute sinusitis -  Augmentin. Call if not better in one week.   Sebaceous cyst on scalp - easily removed.  Given rx for mupiricin ointment.    Flu shot given today.

## 2014-12-27 ENCOUNTER — Other Ambulatory Visit: Payer: Self-pay | Admitting: Family Medicine

## 2014-12-27 DIAGNOSIS — N6320 Unspecified lump in the left breast, unspecified quadrant: Secondary | ICD-10-CM

## 2015-01-01 ENCOUNTER — Encounter: Payer: Self-pay | Admitting: Family Medicine

## 2015-01-01 ENCOUNTER — Ambulatory Visit (INDEPENDENT_AMBULATORY_CARE_PROVIDER_SITE_OTHER): Payer: Medicare Other | Admitting: Family Medicine

## 2015-01-01 VITALS — BP 127/79 | HR 84 | Temp 97.8°F | Resp 18 | Wt 194.7 lb

## 2015-01-01 DIAGNOSIS — E8881 Metabolic syndrome: Secondary | ICD-10-CM

## 2015-01-01 DIAGNOSIS — Z1211 Encounter for screening for malignant neoplasm of colon: Secondary | ICD-10-CM

## 2015-01-01 DIAGNOSIS — R7301 Impaired fasting glucose: Secondary | ICD-10-CM

## 2015-01-01 DIAGNOSIS — K760 Fatty (change of) liver, not elsewhere classified: Secondary | ICD-10-CM | POA: Diagnosis not present

## 2015-01-01 DIAGNOSIS — I10 Essential (primary) hypertension: Secondary | ICD-10-CM

## 2015-01-01 DIAGNOSIS — H9202 Otalgia, left ear: Secondary | ICD-10-CM

## 2015-01-01 LAB — BASIC METABOLIC PANEL WITH GFR
BUN: 19 mg/dL (ref 7–25)
CHLORIDE: 102 mmol/L (ref 98–110)
CO2: 27 mmol/L (ref 20–31)
Calcium: 9.9 mg/dL (ref 8.6–10.4)
Creat: 0.69 mg/dL (ref 0.50–0.99)
GFR, Est African American: >89 mL/min (ref >=60)
GLUCOSE: 94 mg/dL (ref 65–99)
POTASSIUM: 4.4 mmol/L (ref 3.5–5.3)
SODIUM: 140 mmol/L (ref 135–146)

## 2015-01-01 LAB — POCT GLYCOSYLATED HEMOGLOBIN (HGB A1C): Hemoglobin A1C: 5.8

## 2015-01-01 MED ORDER — TRAMADOL HCL 50 MG PO TABS: ORAL_TABLET | ORAL | Status: DC

## 2015-01-01 MED ORDER — PREDNISONE 20 MG PO TABS: 40.0000 mg | ORAL_TABLET | Freq: Every day | ORAL | Status: DC

## 2015-01-01 MED ORDER — LISINOPRIL-HYDROCHLOROTHIAZIDE 10-12.5 MG PO TABS: 1.0000 | ORAL_TABLET | Freq: Every day | ORAL | Status: DC

## 2015-01-01 NOTE — Progress Notes (Signed)
   Subjective:    Patient ID: Martha Perkins, female    DOB: 05-27-1945, 69 y.o.   MRN: [id number]  HPI Hypertension- Pt denies chest pain, SOB, dizziness, or heart palpitations.  Taking meds as directed w/o problems.  Denies medication side effects.    IFG - no inc thirst or urination.   Fatty liver - she is doing well.   She is continuing to work on diet and has lost another pound and half since I last saw her.  Was treated for sinus infection about 3 weeks ago and she feels much better except her ears are still bothering her. Particularly the left and still having a lot of post nasal drip. She is on flonase and Atrovent  Nasal spray.    Review of Systems     Objective:   Physical Exam  Constitutional: She is oriented to person, place, and time. She appears well-developed and well-nourished.  HENT:  Head: Normocephalic and atraumatic.  Right Ear: External ear normal.  Left Ear: External ear normal.  Nose: Nose normal.  Mouth/Throat: Oropharynx is clear and moist.  TMs and canals are clear. Absent light reflex with some retraction.   Eyes: Conjunctivae and EOM are normal. Pupils are equal, round, and reactive to light.  Neck: Neck supple. No thyromegaly present.  Cardiovascular: Normal rate, regular rhythm and normal heart sounds.   Pulmonary/Chest: Effort normal and breath sounds normal. She has no wheezes.  Lymphadenopathy:    She has no cervical adenopathy.  Neurological: She is alert and oriented to person, place, and time.  Skin: Skin is warm and dry.  Psychiatric: She has a normal mood and affect. Her behavior is normal.          Assessment & Plan:  HTN - well controlled. Continue current regimen.    IFG - well controlled. A1C of 5.8. Much improved.   Left ear with TM retracted. Will tx with 5 days of prednisone. She feels like it is not back to baseline for hearing but better.  Popping a lot too.   Fatty liver -  I did encourage her to take her statin. Discussed  with her the reactions some very interesting studies looking at patients who are on statins with fatty liver and actually seems to help their fatty liver. She has not been taking it consistently because she was worried about the effect on her liver.   Due for screening colonoscopy. Referral placed today.

## 2015-01-02 ENCOUNTER — Telehealth: Payer: Self-pay

## 2015-01-02 MED ORDER — LISINOPRIL-HYDROCHLOROTHIAZIDE 10-12.5 MG PO TABS: 1.0000 | ORAL_TABLET | Freq: Every day | ORAL | Status: DC

## 2015-01-02 NOTE — Telephone Encounter (Signed)
Medication sent to the wrong pharmacy; medication resent to local pharmacy.

## 2015-01-02 NOTE — Progress Notes (Signed)
Quick Note:  All labs are normal. ______ 

## 2015-01-11 ENCOUNTER — Other Ambulatory Visit: Payer: Self-pay

## 2015-01-23 ENCOUNTER — Ambulatory Visit
Admission: RE | Admit: 2015-01-23 | Discharge: 2015-01-23 | Disposition: A | Discharge status: Home / Self Care | Payer: Medicare Other | Source: Ambulatory Visit | Attending: Family Medicine | Admitting: Family Medicine

## 2015-01-23 ENCOUNTER — Other Ambulatory Visit: Payer: Self-pay | Admitting: Family Medicine

## 2015-01-23 DIAGNOSIS — N6001 Solitary cyst of right breast: Secondary | ICD-10-CM | POA: Diagnosis not present

## 2015-01-23 DIAGNOSIS — N6012 Diffuse cystic mastopathy of left breast: Secondary | ICD-10-CM | POA: Diagnosis not present

## 2015-01-23 DIAGNOSIS — N6310 Unspecified lump in the right breast, unspecified quadrant: Secondary | ICD-10-CM

## 2015-01-23 DIAGNOSIS — N6320 Unspecified lump in the left breast, unspecified quadrant: Secondary | ICD-10-CM

## 2015-02-20 DIAGNOSIS — Z1211 Encounter for screening for malignant neoplasm of colon: Secondary | ICD-10-CM | POA: Diagnosis not present

## 2015-02-20 DIAGNOSIS — D124 Benign neoplasm of descending colon: Secondary | ICD-10-CM | POA: Diagnosis not present

## 2015-02-20 DIAGNOSIS — K635 Polyp of colon: Secondary | ICD-10-CM | POA: Diagnosis not present

## 2015-02-20 LAB — HM COLONOSCOPY: HM Colonoscopy: 1

## 2015-02-21 ENCOUNTER — Encounter: Payer: Self-pay | Admitting: Family Medicine

## 2015-02-21 DIAGNOSIS — Z961 Presence of intraocular lens: Secondary | ICD-10-CM | POA: Diagnosis not present

## 2015-02-21 DIAGNOSIS — H538 Other visual disturbances: Secondary | ICD-10-CM | POA: Diagnosis not present

## 2015-02-26 ENCOUNTER — Telehealth: Payer: Self-pay | Admitting: Family Medicine

## 2015-02-26 NOTE — Telephone Encounter (Signed)
Pt called office stating she gets recurrent bronchitis this time of year and wants to know if an Rx could just be sent in.   Returned clinic call, left voicemail advising Pt she needs an appt before an Rx can be written for a new problem. Callback information regarding scheduling left on voicemail.

## 2015-02-28 ENCOUNTER — Encounter: Payer: Self-pay | Admitting: Family Medicine

## 2015-02-28 ENCOUNTER — Ambulatory Visit (INDEPENDENT_AMBULATORY_CARE_PROVIDER_SITE_OTHER): Payer: Medicare Other | Admitting: Family Medicine

## 2015-02-28 VITALS — BP 133/66 | HR 95 | Temp 100.0°F | Wt 196.0 lb

## 2015-02-28 DIAGNOSIS — I10 Essential (primary) hypertension: Secondary | ICD-10-CM | POA: Diagnosis not present

## 2015-02-28 DIAGNOSIS — J0100 Acute maxillary sinusitis, unspecified: Secondary | ICD-10-CM

## 2015-02-28 DIAGNOSIS — R8290 Unspecified abnormal findings in urine: Secondary | ICD-10-CM | POA: Diagnosis not present

## 2015-02-28 LAB — POCT URINALYSIS DIPSTICK
Bilirubin, UA: NEGATIVE
Glucose, UA: NEGATIVE
KETONES UA: NEGATIVE
Nitrite, UA: NEGATIVE
PH UA: 7.5
PROTEIN UA: NEGATIVE
SPEC GRAV UA: 1.01
UROBILINOGEN UA: 0.2

## 2015-02-28 MED ORDER — PREDNISONE 20 MG PO TABS: 40.0000 mg | ORAL_TABLET | Freq: Every day | ORAL | Status: DC

## 2015-02-28 MED ORDER — HYDROCODONE-HOMATROPINE 5-1.5 MG/5ML PO SYRP: 5.0000 mL | ORAL_SOLUTION | Freq: Every evening | ORAL | Status: DC | PRN

## 2015-02-28 MED ORDER — AMOXICILLIN-POT CLAVULANATE 875-125 MG PO TABS: 1.0000 | ORAL_TABLET | Freq: Two times a day (BID) | ORAL | Status: DC

## 2015-02-28 NOTE — Progress Notes (Signed)
   Subjective:    Patient ID: Martha Perkins, female    DOB: Sep 07, 1945, 69 y.o.   MRN: [id number]  HPI 4 days of cough that is productive. Chest and back hurts now. Yesterday started running a fever.  Has chornic sinus congestion. Has had right sided facial pain ht elast few days. Using tylenol.  Scratchy throat. No ear pain. Using sudafed and robitussion.    Hypertension- Pt denies chest pain, SOB, dizziness, or heart palpitations.  Taking meds as directed w/o problems.  Denies medication side effects.    Bad odor to urine yesterday. No pain or burning.  No blood in the urine.    Review of Systems     Objective:   Physical Exam  Constitutional: She is oriented to person, place, and time. She appears well-developed and well-nourished.  HENT:  Head: Normocephalic and atraumatic.  Right Ear: External ear normal.  Left Ear: External ear normal.  Nose: Nose normal.  Mouth/Throat: Oropharynx is clear and moist.  TMs and canals are clear.   Eyes: Conjunctivae and EOM are normal. Pupils are equal, round, and reactive to light.  Neck: Neck supple. No thyromegaly present.  Cardiovascular: Normal rate, regular rhythm and normal heart sounds.   Pulmonary/Chest: Effort normal and breath sounds normal. No respiratory distress. She has no wheezes. She has no rales.  Coarse BS.   Lymphadenopathy:    She has no cervical adenopathy.  Neurological: She is alert and oriented to person, place, and time.  Skin: Skin is warm and dry.  Psychiatric: She has a normal mood and affect.          Assessment & Plan:  Acute sinusitis/bronchitis - will treat with Augmentin. Also given a perception for cough medication. I did go ahead and give her perception for prednisone to use if she needs it but hopefully she will not.  HTN - well controlled.  contiue current regimen.     Bad odor -  UA is neg.  Will send for culture.

## 2015-03-06 ENCOUNTER — Encounter: Payer: Self-pay | Admitting: Family Medicine

## 2015-03-14 ENCOUNTER — Encounter: Payer: Self-pay | Admitting: Family Medicine

## 2015-03-14 ENCOUNTER — Ambulatory Visit (INDEPENDENT_AMBULATORY_CARE_PROVIDER_SITE_OTHER): Payer: Medicare Other | Admitting: Family Medicine

## 2015-03-14 VITALS — BP 117/65 | HR 82 | Temp 97.9°F | Wt 195.0 lb

## 2015-03-14 DIAGNOSIS — N3001 Acute cystitis with hematuria: Secondary | ICD-10-CM | POA: Diagnosis not present

## 2015-03-14 DIAGNOSIS — R300 Dysuria: Secondary | ICD-10-CM | POA: Diagnosis not present

## 2015-03-14 DIAGNOSIS — R05 Cough: Secondary | ICD-10-CM

## 2015-03-14 DIAGNOSIS — R058 Other specified cough: Secondary | ICD-10-CM

## 2015-03-14 LAB — POCT URINALYSIS DIPSTICK
Bilirubin, UA: NEGATIVE
Glucose, UA: NEGATIVE
KETONES UA: NEGATIVE
NITRITE UA: NEGATIVE
PH UA: 6
PROTEIN UA: NEGATIVE
SPEC GRAV UA: 1.02
Urobilinogen, UA: 0.2

## 2015-03-14 MED ORDER — SULFAMETHOXAZOLE-TRIMETHOPRIM 800-160 MG PO TABS: 1.0000 | ORAL_TABLET | Freq: Two times a day (BID) | ORAL | Status: DC

## 2015-03-14 MED ORDER — PHENAZOPYRIDINE HCL 200 MG PO TABS: 200.0000 mg | ORAL_TABLET | Freq: Three times a day (TID) | ORAL | Status: DC | PRN

## 2015-03-14 NOTE — Progress Notes (Signed)
   Subjective:    Patient ID: Martha Perkins, female    DOB: 26-Oct-1945, 70 y.o.   MRN: [id number]  HPI Woke up at 3AM with pain in the urethra.  Says having some urgency as well. No fever, chills or sweats.  She is not sure if passing a stone or not.   She still has some cough.  Occ productive. Seen about 2 weeks ago for sinusitis/bronchitis nad tx'ed with Augmentin and prednisone. She is better but cough is lingering. No fevers chills or sweats. No wheezing or shortness of breath.  Review of Systems     Objective:   Physical Exam  Constitutional: She is oriented to person, place, and time. She appears well-developed and well-nourished.  HENT:  Head: Normocephalic and atraumatic.  Right Ear: External ear normal.  Left Ear: External ear normal.  Nose: Nose normal.  Mouth/Throat: Oropharynx is clear and moist.  TMs and canals are clear.   Eyes: Conjunctivae and EOM are normal. Pupils are equal, round, and reactive to light.  Neck: Neck supple. No thyromegaly present.  Cardiovascular: Normal rate, regular rhythm and normal heart sounds.   Pulmonary/Chest: Effort normal and breath sounds normal. She has no wheezes.  Lymphadenopathy:    She has no cervical adenopathy.  Neurological: She is alert and oriented to person, place, and time.  Skin: Skin is warm and dry.  Psychiatric: She has a normal mood and affect.          Assessment & Plan:  UTI-we'll treat with 5 days of Bactrim. I didn't send for culture since she does get recurrent UTIs. This pain is also similar to the last time she passed a urinary stones I did give her a trainer to try to see if she catches a stone. If she starts passing more blood or 6 parents and more severe pain or develops a fever then call us back immediately.  Postinfectious cough-lung exam is clear today and she is feeling much better. I think she is just extrinsic postinfectious cough which can last for several weeks and I gave her some reassurance. Okay to  use over-the-counter cough medications if needed.

## 2015-03-16 LAB — URINE CULTURE

## 2015-03-21 ENCOUNTER — Ambulatory Visit (INDEPENDENT_AMBULATORY_CARE_PROVIDER_SITE_OTHER): Payer: Medicare Other | Admitting: Family Medicine

## 2015-03-21 ENCOUNTER — Encounter: Payer: Self-pay | Admitting: Family Medicine

## 2015-03-21 ENCOUNTER — Ambulatory Visit (INDEPENDENT_AMBULATORY_CARE_PROVIDER_SITE_OTHER): Payer: Medicare Other

## 2015-03-21 VITALS — BP 122/95 | HR 96 | Temp 98.4°F | Wt 195.0 lb

## 2015-03-21 DIAGNOSIS — R05 Cough: Secondary | ICD-10-CM

## 2015-03-21 DIAGNOSIS — R0602 Shortness of breath: Secondary | ICD-10-CM

## 2015-03-21 DIAGNOSIS — J019 Acute sinusitis, unspecified: Secondary | ICD-10-CM | POA: Diagnosis not present

## 2015-03-21 DIAGNOSIS — R059 Cough, unspecified: Secondary | ICD-10-CM

## 2015-03-21 MED ORDER — CEFDINIR 300 MG PO CAPS: 300.0000 mg | ORAL_CAPSULE | Freq: Two times a day (BID) | ORAL | Status: DC

## 2015-03-21 MED ORDER — PREDNISONE 20 MG PO TABS: 40.0000 mg | ORAL_TABLET | Freq: Every day | ORAL | Status: DC

## 2015-03-21 NOTE — Progress Notes (Signed)
   Subjective:    Patient ID: Martha Perkins, female    DOB: 06-Aug-1945, 70 y.o.   MRN: [id number]  HPI    Review of Systems     Objective:   Physical Exam        Assessment & Plan:

## 2015-03-21 NOTE — Progress Notes (Signed)
   Subjective:    Patient ID: Martha Perkins, female    DOB: [birth year], 70 y.o.   MRN: [id number]  HPI    Review of Systems     Objective:   Physical Exam        Assessment & Plan:

## 2015-03-21 NOTE — Progress Notes (Signed)
   Subjective:    Patient ID: Martha Perkins, female    DOB: 11/10/1945, 70 y.o.   MRN: [id number]  HPI Here today for persistent cough and new sinus symptoms. She was seen approximately 3 weeks ago for acute sinusitis and bronchitis. She was treated with Augmentin. When I saw her couple weeks ago she actually felt better as far as her sinuses were concerned was still having a postinfectious cough. She says her cough is actually gotten worse in the last 3-4 days and over the last 2 days has started to experience some nasal congestion pressure and headache and facial pain. No fevers or chills or sweats. She has been using the nighttime cough medicine. Her cough is becoming more productive and deep again. She has had some wheezing and felt more short of breath. She has been using her albuterol.   Review of Systems     Objective:   Physical Exam  Constitutional: She is oriented to person, place, and time. She appears well-developed and well-nourished.  HENT:  Head: Normocephalic and atraumatic.  Right Ear: External ear normal.  Left Ear: External ear normal.  Nose: Nose normal.  Mouth/Throat: Oropharynx is clear and moist.  TMs and canals are clear.   Eyes: Conjunctivae and EOM are normal. Pupils are equal, round, and reactive to light.  Neck: Neck supple. No thyromegaly present.  Cardiovascular: Normal rate, regular rhythm and normal heart sounds.   Pulmonary/Chest: Effort normal. She has wheezes.  Wheezing at the right base.  Lymphadenopathy:    She has no cervical adenopathy.  Neurological: She is alert and oriented to person, place, and time.  Skin: Skin is warm and dry.  Psychiatric: She has a normal mood and affect.          Assessment & Plan:  Acute sinusitis-unclear if this is a persistence of the original infection from 3 weeks ago or if this is a new possible viral infection.  Persistent cough-at this point her cough is actually getting worse after 3 weeks and I do hear  some wheezing on exam today summoning before with a chest x-ray to evaluate for possible pneumonia as this may affect our antibiotic choice. Will call with results once available. She does have some Hycodan cough syrup at home. Consider adding a course of prednisone if needed. Essentially since she's had more shortness of breath and wheezing.

## 2015-03-21 NOTE — Addendum Note (Signed)
Addended by: Beatrice Lecher D on: 03/21/2015 12:03 PM   Modules accepted: Orders

## 2015-04-09 ENCOUNTER — Other Ambulatory Visit: Payer: Self-pay | Admitting: Family Medicine

## 2015-04-12 ENCOUNTER — Other Ambulatory Visit: Payer: Self-pay | Admitting: Family Medicine

## 2015-05-17 ENCOUNTER — Other Ambulatory Visit: Payer: Self-pay | Admitting: Family Medicine

## 2015-05-25 ENCOUNTER — Ambulatory Visit (INDEPENDENT_AMBULATORY_CARE_PROVIDER_SITE_OTHER): Payer: Medicare Other | Admitting: Family Medicine

## 2015-05-25 ENCOUNTER — Encounter: Payer: Self-pay | Admitting: Family Medicine

## 2015-05-25 VITALS — BP 135/58 | HR 91 | Wt 196.0 lb

## 2015-05-25 DIAGNOSIS — R300 Dysuria: Secondary | ICD-10-CM | POA: Diagnosis not present

## 2015-05-25 DIAGNOSIS — N3000 Acute cystitis without hematuria: Secondary | ICD-10-CM

## 2015-05-25 LAB — POCT URINALYSIS DIPSTICK
BILIRUBIN UA: NEGATIVE
Glucose, UA: NEGATIVE
KETONES UA: NEGATIVE
Nitrite, UA: NEGATIVE
PH UA: 6
PROTEIN UA: NEGATIVE
SPEC GRAV UA: 1.015
Urobilinogen, UA: 0.2

## 2015-05-25 MED ORDER — CIPROFLOXACIN HCL 500 MG PO TABS: 500.0000 mg | ORAL_TABLET | Freq: Two times a day (BID) | ORAL | Status: AC

## 2015-05-25 NOTE — Patient Instructions (Signed)

## 2015-05-25 NOTE — Progress Notes (Signed)
   Subjective:    Patient ID: Martha Perkins, female    DOB: [birth year], 70 y.o.   MRN: [id number]  HPI Abdominal Pain she has some nausea wednesday, yesterday she had some low abdominal cramping and spasms. Also noticed some Dysuria. Pt reports that she noticed that her urine had an odor , burning with urination. took some azo last night. No fevers chills or sweats. No worsening or alleviating factors.   Review of Systems     Objective:   Physical Exam  Constitutional: She is oriented to person, place, and time. She appears well-developed and well-nourished.  HENT:  Head: Normocephalic and atraumatic.  Cardiovascular: Normal rate, regular rhythm and normal heart sounds.   Pulmonary/Chest: Effort normal and breath sounds normal.  Abdominal: Soft. Bowel sounds are normal. She exhibits no distension and no mass. There is no tenderness. There is no rebound and no guarding.  Musculoskeletal:  No CVA tenderness   Neurological: She is alert and oriented to person, place, and time.  Skin: Skin is warm and dry.  Psychiatric: She has a normal mood and affect. Her behavior is normal.          Assessment & Plan:  UTI - will tx with Cipro. Increase fluids. Symptomatic care

## 2015-05-31 ENCOUNTER — Telehealth: Payer: Self-pay | Admitting: *Deleted

## 2015-05-31 NOTE — Telephone Encounter (Signed)
Pt called and lvm stating that she is still having problems with her bladder. She is experiencing pain.Martha Perkins

## 2015-06-19 ENCOUNTER — Other Ambulatory Visit: Payer: Self-pay | Admitting: Family Medicine

## 2015-07-02 ENCOUNTER — Other Ambulatory Visit: Payer: Self-pay | Admitting: Family Medicine

## 2015-07-02 ENCOUNTER — Encounter: Payer: Self-pay | Admitting: Family Medicine

## 2015-07-02 ENCOUNTER — Telehealth: Payer: Self-pay | Admitting: *Deleted

## 2015-07-02 ENCOUNTER — Other Ambulatory Visit: Payer: Self-pay | Admitting: *Deleted

## 2015-07-02 ENCOUNTER — Ambulatory Visit (INDEPENDENT_AMBULATORY_CARE_PROVIDER_SITE_OTHER): Payer: Medicare Other | Admitting: Family Medicine

## 2015-07-02 VITALS — BP 101/57 | HR 92 | Wt 194.0 lb

## 2015-07-02 DIAGNOSIS — E0590 Thyrotoxicosis, unspecified without thyrotoxic crisis or storm: Secondary | ICD-10-CM

## 2015-07-02 DIAGNOSIS — I499 Cardiac arrhythmia, unspecified: Secondary | ICD-10-CM | POA: Diagnosis not present

## 2015-07-02 DIAGNOSIS — Z130 Encounter for screening for diseases of the blood and blood-forming organs and certain disorders involving the immune mechanism: Secondary | ICD-10-CM | POA: Diagnosis not present

## 2015-07-02 DIAGNOSIS — E785 Hyperlipidemia, unspecified: Secondary | ICD-10-CM | POA: Diagnosis not present

## 2015-07-02 DIAGNOSIS — E8881 Metabolic syndrome: Secondary | ICD-10-CM | POA: Diagnosis not present

## 2015-07-02 DIAGNOSIS — N3000 Acute cystitis without hematuria: Secondary | ICD-10-CM

## 2015-07-02 DIAGNOSIS — R300 Dysuria: Secondary | ICD-10-CM

## 2015-07-02 DIAGNOSIS — I4891 Unspecified atrial fibrillation: Secondary | ICD-10-CM

## 2015-07-02 DIAGNOSIS — I10 Essential (primary) hypertension: Secondary | ICD-10-CM | POA: Diagnosis not present

## 2015-07-02 DIAGNOSIS — R5383 Other fatigue: Secondary | ICD-10-CM | POA: Diagnosis not present

## 2015-07-02 DIAGNOSIS — Z23 Encounter for immunization: Secondary | ICD-10-CM | POA: Diagnosis not present

## 2015-07-02 DIAGNOSIS — I4819 Other persistent atrial fibrillation: Secondary | ICD-10-CM

## 2015-07-02 DIAGNOSIS — I481 Persistent atrial fibrillation: Secondary | ICD-10-CM

## 2015-07-02 LAB — CBC WITH DIFFERENTIAL/PLATELET
Basophils Absolute: 0 cells/uL (ref 0–200)
Basophils Relative: 0 %
Eosinophils Absolute: 162 cells/uL (ref 15–500)
Eosinophils Relative: 3 %
HEMATOCRIT: 36.6 % (ref 35.0–45.0)
Hemoglobin: 12.4 g/dL (ref 11.7–15.5)
LYMPHS PCT: 34 %
Lymphs Abs: 1836 cells/uL (ref 850–3900)
MCH: 29.1 pg (ref 27.0–33.0)
MCHC: 33.9 g/dL (ref 32.0–36.0)
MCV: 85.9 fL (ref 80.0–100.0)
MONO ABS: 648 {cells}/uL (ref 200–950)
MONOS PCT: 12 %
MPV: 10.8 fL (ref 7.5–12.5)
Neutro Abs: 2754 cells/uL (ref 1500–7800)
Neutrophils Relative %: 51 %
Platelets: 180 10*3/uL (ref 140–400)
RBC: 4.26 MIL/uL (ref 3.80–5.10)
RDW: 12.2 % (ref 11.0–15.0)
WBC: 5.4 10*3/uL (ref 3.8–10.8)

## 2015-07-02 LAB — COMPLETE METABOLIC PANEL WITH GFR
ALT: 28 U/L (ref 6–29)
AST: 17 U/L (ref 10–35)
Albumin: 3.9 g/dL (ref 3.6–5.1)
Alkaline Phosphatase: 58 U/L (ref 33–130)
BILIRUBIN TOTAL: 1.5 mg/dL — AB (ref 0.2–1.2)
BUN: 20 mg/dL (ref 7–25)
CALCIUM: 9.2 mg/dL (ref 8.6–10.4)
CO2: 27 mmol/L (ref 20–31)
Chloride: 105 mmol/L (ref 98–110)
Creat: 0.56 mg/dL (ref 0.50–0.99)
Glucose, Bld: 96 mg/dL (ref 65–99)
Potassium: 3.6 mmol/L (ref 3.5–5.3)
Sodium: 141 mmol/L (ref 135–146)
TOTAL PROTEIN: 5.8 g/dL — AB (ref 6.1–8.1)

## 2015-07-02 LAB — POCT URINALYSIS DIPSTICK
BILIRUBIN UA: NEGATIVE
GLUCOSE UA: NEGATIVE
Ketones, UA: NEGATIVE
NITRITE UA: NEGATIVE
Protein, UA: NEGATIVE
Spec Grav, UA: 1.02
UROBILINOGEN UA: 0.2
pH, UA: 5.5

## 2015-07-02 LAB — LIPID PANEL
CHOLESTEROL: 124 mg/dL — AB (ref 125–200)
HDL: 31 mg/dL — AB (ref >=46)
LDL CALC: 59 mg/dL (ref <130)
TRIGLYCERIDES: 170 mg/dL — AB (ref <150)
Total CHOL/HDL Ratio: 4 Ratio (ref <=5.0)
VLDL: 34 mg/dL — ABNORMAL HIGH (ref <30)

## 2015-07-02 LAB — POCT GLYCOSYLATED HEMOGLOBIN (HGB A1C): Hemoglobin A1C: 5.7

## 2015-07-02 LAB — FERRITIN: FERRITIN: 164 ng/mL (ref 20–288)

## 2015-07-02 LAB — TSH

## 2015-07-02 LAB — VITAMIN B12: VITAMIN B 12: 626 pg/mL (ref 200–1100)

## 2015-07-02 MED ORDER — METOPROLOL SUCCINATE ER 25 MG PO TB24: 25.0000 mg | ORAL_TABLET | Freq: Every day | ORAL | Status: DC

## 2015-07-02 MED ORDER — CIPROFLOXACIN HCL 500 MG PO TABS
500.0000 mg | ORAL_TABLET | Freq: Two times a day (BID) | ORAL | Status: AC
Start: 2015-07-02 — End: 2015-07-05

## 2015-07-02 MED ORDER — APIXABAN 5 MG PO TABS: 5.0000 mg | ORAL_TABLET | Freq: Two times a day (BID) | ORAL | Status: DC

## 2015-07-02 NOTE — Telephone Encounter (Signed)
Called pt and informed her that she is in A-fib and that Dr. Madilyn Fireman has sent over Eliquis 5 mg and Metoprolol 25 mg for her to start taking and also would like for her to cut her lisinopril in half. We will place a referral for cardiology for her. Asked that she rtn call to discuss this further.Martha Perkins

## 2015-07-02 NOTE — Assessment & Plan Note (Signed)
If anything blood pressure is a little bit low today. She actually has a blood pressure cuff at home. I'm not sure if this is just quite incidental today or if it actually has been running low and might actually explain her increased fatigue over the last few weeks. She will keep an eye on it this next week and check it daily. If blood pressures are running low sugars me a call and we can consider cutting her lisinopril HCTZ in half.

## 2015-07-02 NOTE — Telephone Encounter (Signed)
Called pt and scheduled a f/u appt for Friday.Audelia Hives [place]

## 2015-07-02 NOTE — Progress Notes (Addendum)
Subjective:    Patient ID: Martha Perkins, female    DOB: 08/18/1945, 70 y.o.   MRN: [id number]  HPI IFG - no hypoglycemic events. No wounds or sores that are not healing well. No increased thirst or urination. Checking glucose at home.   Hypertension- Pt denies chest pain, SOB, dizziness, or heart palpitations.  Taking meds as directed w/o problems.  Denies medication side effects.    She says starting last night around 10 PM she started to get some burning with urination and feeling like she's getting bladder spasms. Unfortunately she has had several UTIs in the last couple of years. It has been several years since she last saw urology. No fevers chills or sweats. Next  She's also felt more fatigued for the last 3-4 weeks. She knows she notices she's been more short of breath with activity. She says she at her baseline gets a little bit short of breath just because she is not very active. But she says this is a little bit different. She denies any chest pain Or palpitations. She wonders if she could have a deficiency. She has lost 2 pounds since I last saw her and her blood pressures actually bit low today.   Review of Systems  BP 101/57 mmHg  Pulse 92  Wt 194 lb (87.998 kg)  SpO2 99%    Allergies  Allergen Reactions  . Demerol Nausea Only  . Meperidine Hcl   . Oxycodone-Aspirin   . Percocet [Oxycodone-Acetaminophen]     Past Medical History  Diagnosis Date  . Hypertension   . Hyperlipidemia   . Thyroid disease   . Kidney stone   . Depression     Past Surgical History  Procedure Laterality Date  . Thyroidectomy  1971  . Tubal ligation  1979  . Replacement total knee bilateral  2011  . Lithotripsy    . Cataract extraction Left 09/2012    Dr. Bunnie Philips    Social History   Social History  . Marital Status: Married    Spouse Name: N/A  . Number of Children: N/A  . Years of Education: N/A   Occupational History  . Retired Therapist, sports.     Social History Main Topics   . Smoking status: Never Smoker   . Smokeless tobacco: Not on file  . Alcohol Use: No  . Drug Use: No  . Sexual Activity: Not on file   Other Topics Concern  . Not on file   Social History Narrative    Family History  Problem Relation Age of Onset  . Coronary artery disease Mother 31    deceased  . Depression Mother   . Diabetes Paternal Grandmother   . Hyperlipidemia Mother     Outpatient Encounter Prescriptions as of 07/02/2015  Medication Sig  . albuterol (PROAIR HFA) 108 (90 BASE) MCG/ACT inhaler Inhale 2 puffs into the lungs every 6 (six) hours as needed for wheezing or shortness of breath.  Marland Kitchen apixaban (ELIQUIS) 5 MG TABS tablet Take 1 tablet (5 mg total) by mouth 2 (two) times daily.  Marland Kitchen atorvastatin (LIPITOR) 40 MG tablet Take 1 tablet (40 mg total) by mouth daily.  . ciprofloxacin (CIPRO) 500 MG tablet Take 1 tablet (500 mg total) by mouth 2 (two) times daily.  Marland Kitchen esomeprazole (NEXIUM) 40 MG capsule Take 1 capsule (40 mg total) by mouth daily.  . fluticasone (FLONASE) 50 MCG/ACT nasal spray One spray in each nostril twice a day, use left hand for right nostril, and  right hand for left nostril.  Marland Kitchen ipratropium (ATROVENT) 0.06 % nasal spray   . ketoconazole (NIZORAL) 2 % cream APPLY   TOPICALLY ONCE DAILY  . lisinopril-hydrochlorothiazide (PRINZIDE,ZESTORETIC) 10-12.5 MG tablet TAKE ONE TABLET BY MOUTH ONCE DAILY  . meloxicam (MOBIC) 15 MG tablet TAKE ONE TABLET BY MOUTH ONCE DAILY AS NEEDED  . metoprolol succinate (TOPROL-XL) 25 MG 24 hr tablet Take 1 tablet (25 mg total) by mouth daily.  . traMADol (ULTRAM) 50 MG tablet TAKE ONE TABLET BY MOUTH ONCE DAILY AT BEDTIME AS NEEDED FOR SEVERE PAIN   No facility-administered encounter medications on file as of 07/02/2015.          Objective:   Physical Exam  Constitutional: She is oriented to person, place, and time. She appears well-developed and well-nourished.  HENT:  Head: Normocephalic and atraumatic.  Right Ear:  External ear normal.  Left Ear: External ear normal.  Nose: Nose normal.  Eyes: Conjunctivae are normal.  Neck: Neck supple. No thyromegaly present.  Cardiovascular: Normal rate and normal heart sounds.   + irregular rhythm  Pulmonary/Chest: Effort normal and breath sounds normal.  Lymphadenopathy:    She has no cervical adenopathy.  Neurological: She is alert and oriented to person, place, and time.  Skin: Skin is warm and dry.  Psychiatric: She has a normal mood and affect. Her behavior is normal.        Assessment & Plan:  UTI- Urinalysis confirms during tract infection. We'll go ahead and treat with Cipro. New perception sent to the pharmacy.  Fatigue-we'll evaluate further for anemia and thyroid disorder and electrolytes disturbance. It also because of the atrial fibrillation as noted below.  Atrial fibrillation -  Chad's Vasc score of  3.   EKG today shows rate in the 135 bpm, irregular irregular rhythm with normal axis.  Patient was notified after she had  left to go to the lab. We'll go ahead and start her on Eliquis and a low-dose beta blocker. Since her blood pressure was a little bit low today and a half ago and cut her lisinopril in half while starting the metoprolol and then see me back later this week. We will work on going ahead and getting her in with cardiology for new onset atrial fibrillation. She's felt fatigued over the last 3-4 weeks so this was probably the onset of the atrial fibrillation.

## 2015-07-02 NOTE — Assessment & Plan Note (Signed)
Lab Results  Component Value Date   HGBA1C 5.7 07/02/2015   Stable. Continue current regimen. Follow-up in 6 months.

## 2015-07-02 NOTE — Telephone Encounter (Signed)
Patient advised. See note below.

## 2015-07-04 LAB — T3: T3 TOTAL: 319 ng/dL — AB (ref 76–181)

## 2015-07-04 LAB — T4, FREE: Free T4: 2.3 ng/dL — ABNORMAL HIGH (ref 0.8–1.8)

## 2015-07-06 ENCOUNTER — Ambulatory Visit: Payer: Self-pay | Admitting: Family Medicine

## 2015-07-06 NOTE — Addendum Note (Signed)
Addended by: Beatrice Lecher D on: 07/06/2015 08:55 AM   Modules accepted: Orders

## 2015-07-09 ENCOUNTER — Other Ambulatory Visit: Payer: Self-pay | Admitting: Family Medicine

## 2015-07-09 ENCOUNTER — Other Ambulatory Visit: Payer: Self-pay

## 2015-07-09 DIAGNOSIS — E0590 Thyrotoxicosis, unspecified without thyrotoxic crisis or storm: Secondary | ICD-10-CM

## 2015-07-09 NOTE — Progress Notes (Signed)
Order changed per imaging request.

## 2015-07-10 ENCOUNTER — Ambulatory Visit: Payer: Self-pay | Admitting: Family Medicine

## 2015-07-13 ENCOUNTER — Other Ambulatory Visit: Payer: Self-pay | Admitting: *Deleted

## 2015-07-13 ENCOUNTER — Ambulatory Visit (INDEPENDENT_AMBULATORY_CARE_PROVIDER_SITE_OTHER): Payer: Medicare Other

## 2015-07-13 ENCOUNTER — Ambulatory Visit (INDEPENDENT_AMBULATORY_CARE_PROVIDER_SITE_OTHER): Payer: Medicare Other | Admitting: Family Medicine

## 2015-07-13 ENCOUNTER — Telehealth: Payer: Self-pay | Admitting: *Deleted

## 2015-07-13 ENCOUNTER — Encounter: Payer: Self-pay | Admitting: Family Medicine

## 2015-07-13 VITALS — BP 133/84 | HR 90 | Wt 189.0 lb

## 2015-07-13 DIAGNOSIS — R062 Wheezing: Secondary | ICD-10-CM | POA: Diagnosis not present

## 2015-07-13 DIAGNOSIS — R05 Cough: Secondary | ICD-10-CM

## 2015-07-13 DIAGNOSIS — I4891 Unspecified atrial fibrillation: Secondary | ICD-10-CM

## 2015-07-13 DIAGNOSIS — I10 Essential (primary) hypertension: Secondary | ICD-10-CM

## 2015-07-13 DIAGNOSIS — E0590 Thyrotoxicosis, unspecified without thyrotoxic crisis or storm: Secondary | ICD-10-CM | POA: Diagnosis not present

## 2015-07-13 DIAGNOSIS — M159 Polyosteoarthritis, unspecified: Secondary | ICD-10-CM

## 2015-07-13 DIAGNOSIS — R059 Cough, unspecified: Secondary | ICD-10-CM

## 2015-07-13 DIAGNOSIS — M150 Primary generalized (osteo)arthritis: Secondary | ICD-10-CM

## 2015-07-13 MED ORDER — METOPROLOL SUCCINATE ER 50 MG PO TB24: 50.0000 mg | ORAL_TABLET | Freq: Every day | ORAL | Status: DC

## 2015-07-13 MED ORDER — TRAMADOL HCL 50 MG PO TABS: 50.0000 mg | ORAL_TABLET | Freq: Three times a day (TID) | ORAL | Status: DC

## 2015-07-13 MED ORDER — LISINOPRIL-HYDROCHLOROTHIAZIDE 10-12.5 MG PO TABS: 1.0000 | ORAL_TABLET | Freq: Every day | ORAL | Status: DC

## 2015-07-13 NOTE — Progress Notes (Signed)
Subjective:    Patient ID: Martha Perkins, female    DOB: 27-Nov-1945, 70 y.o.   MRN: [id number]  HPI Here today to follow-up for new onset atrial fibrillation. I saw her about 2 weeks ago and she was in A. fib in the office. She had complained of some increased shortness of breath with certain activities. Her chads Vasc score of 3. Started her on Eliquis and a low-dose beta blocker. We also did blood work which revealed that she is also hyperthyroid.   Generalized osteoarthritis-she normally takes daily NSAIDs to help control her arthritis pain. She normally takes tramadol at bedtime. She was told by the pharmacist that she can't take any NSAIDs or aspirin products with the Eliquis and so wants to know what she can take.  Hyperthyroidism-she did note that in her early 41s that she had a cyst taken off of her thyroid. It was benign. But she does report extensive thyroid problems in her family.  Review of Systems   BP 133/84 mmHg  Pulse 90  Wt 189 lb (85.73 kg)  SpO2 94%    Allergies  Allergen Reactions  . Demerol Nausea Only  . Meperidine Hcl   . Oxycodone-Aspirin   . Percocet [Oxycodone-Acetaminophen]     Past Medical History  Diagnosis Date  . Hypertension   . Hyperlipidemia   . Thyroid disease   . Kidney stone   . Depression     Past Surgical History  Procedure Laterality Date  . Thyroidectomy  1971  . Tubal ligation  1979  . Replacement total knee bilateral  2011  . Lithotripsy    . Cataract extraction Left 09/2012    Dr. Bunnie Philips    Social History   Social History  . Marital Status: Married    Spouse Name: N/A  . Number of Children: N/A  . Years of Education: N/A   Occupational History  . Retired Therapist, sports.     Social History Main Topics  . Smoking status: Never Smoker   . Smokeless tobacco: Not on file  . Alcohol Use: No  . Drug Use: No  . Sexual Activity: Not on file   Other Topics Concern  . Not on file   Social History Narrative    Family  History  Problem Relation Age of Onset  . Coronary artery disease Mother 13    deceased  . Depression Mother   . Diabetes Paternal Grandmother   . Hyperlipidemia Mother     Outpatient Encounter Prescriptions as of 07/13/2015  Medication Sig  . albuterol (PROAIR HFA) 108 (90 BASE) MCG/ACT inhaler Inhale 2 puffs into the lungs every 6 (six) hours as needed for wheezing or shortness of breath.  Marland Kitchen apixaban (ELIQUIS) 5 MG TABS tablet Take 1 tablet (5 mg total) by mouth 2 (two) times daily.  Marland Kitchen atorvastatin (LIPITOR) 40 MG tablet Take 1 tablet (40 mg total) by mouth daily.  Marland Kitchen esomeprazole (NEXIUM) 40 MG capsule Take 1 capsule (40 mg total) by mouth daily.  . fluticasone (FLONASE) 50 MCG/ACT nasal spray One spray in each nostril twice a day, use left hand for right nostril, and right hand for left nostril.  Marland Kitchen ipratropium (ATROVENT) 0.06 % nasal spray   . ketoconazole (NIZORAL) 2 % cream APPLY   TOPICALLY ONCE DAILY  . lisinopril-hydrochlorothiazide (PRINZIDE,ZESTORETIC) 10-12.5 MG tablet Take 1 tablet by mouth daily.  . meloxicam (MOBIC) 15 MG tablet TAKE ONE TABLET BY MOUTH ONCE DAILY AS NEEDED  . metoprolol succinate (TOPROL-XL) 25  MG 24 hr tablet Take 1 tablet (25 mg total) by mouth daily.  . traMADol (ULTRAM) 50 MG tablet Take 1 tablet (50 mg total) by mouth 3 (three) times daily.  . [DISCONTINUED] lisinopril-hydrochlorothiazide (PRINZIDE,ZESTORETIC) 10-12.5 MG tablet TAKE ONE TABLET BY MOUTH ONCE DAILY  . [DISCONTINUED] traMADol (ULTRAM) 50 MG tablet TAKE ONE TABLET BY MOUTH ONCE DAILY AT BEDTIME AS NEEDED FOR SEVERE PAIN   No facility-administered encounter medications on file as of 07/13/2015.          Objective:   Physical Exam  Constitutional: She is oriented to person, place, and time. She appears well-developed and well-nourished.  HENT:  Head: Normocephalic and atraumatic.  Right Ear: External ear normal.  Left Ear: External ear normal.  Nose: Nose normal.  Mouth/Throat:  Oropharynx is clear and moist.  TMs and canals are clear.   Eyes: Conjunctivae and EOM are normal. Pupils are equal, round, and reactive to light.  Neck: Neck supple. No thyromegaly present.  Cardiovascular: Normal rate, regular rhythm and normal heart sounds.   Irregularly irregular rhythm  Pulmonary/Chest: Effort normal and breath sounds normal. She has no wheezes.  Lymphadenopathy:    She has no cervical adenopathy.  Neurological: She is alert and oriented to person, place, and time.  Skin: Skin is warm and dry.  Psychiatric: She has a normal mood and affect. Her behavior is normal.       Assessment & Plan:  Atrial fibrillation-we did place a referral to cardiology to get her in for the atrial fibrillation. She is anticoagulated currently. Note, her insurance will not pay for Eliquis. They did allow her to have a 30 day supply but we will need to change the medication. She thinks she has a formulary booklet at home and will check and let us know. We will also try increasing her metoprolol to get better rate control since she is still fairly symptomatic. Awaiting cardiology referral.  Hypothyroidism-she is scheduled for a thyroid uptake scan.  Given handout and additional information on the procedure. She says she is actually scheduled next week.  Cough or shortness of breath and wheezing-we'll go ahead and get chest x-ray just make sure that she doesn't have an infection or pulmonary effusions because of the atrial fibrillation.  Generalized osteoarthritis-we'll go ahead and increase tramadol to 3 times a day temporal rarely since she is not able to take NSAIDs.

## 2015-07-13 NOTE — Patient Instructions (Signed)
Please take a look in her book and call me with which medication is preferred over the Eliquis. If you're not sure that if you want to make a photocopy them fax it to rest all be happy to take a look.

## 2015-07-13 NOTE — Telephone Encounter (Signed)
Letter form [employer] states they will no longer cover Eliquis. Form was placed in my box to call and see what it covered by insurance that is comparable. Spoke with rep who states the plan "offers no recommendations." Rep suggests initiating a prior auth. Waiting on fax form

## 2015-07-18 ENCOUNTER — Ambulatory Visit (HOSPITAL_COMMUNITY)
Admission: RE | Admit: 2015-07-18 | Discharge: 2015-07-18 | Disposition: A | Discharge status: Home / Self Care | Payer: Medicare Other | Source: Ambulatory Visit | Attending: Family Medicine | Admitting: Family Medicine

## 2015-07-18 DIAGNOSIS — E0590 Thyrotoxicosis, unspecified without thyrotoxic crisis or storm: Secondary | ICD-10-CM

## 2015-07-18 MED ORDER — SODIUM IODIDE I 131 CAPSULE: 13.0000 | Freq: Once | INTRAVENOUS | Status: DC | PRN

## 2015-07-19 ENCOUNTER — Encounter (HOSPITAL_COMMUNITY)
Admission: RE | Admit: 2015-07-19 | Discharge: 2015-07-19 | Disposition: A | Discharge status: Home / Self Care | Payer: Medicare Other | Source: Ambulatory Visit | Attending: Family Medicine | Admitting: Family Medicine

## 2015-07-19 DIAGNOSIS — E0590 Thyrotoxicosis, unspecified without thyrotoxic crisis or storm: Secondary | ICD-10-CM | POA: Diagnosis not present

## 2015-07-19 DIAGNOSIS — Z9089 Acquired absence of other organs: Secondary | ICD-10-CM | POA: Diagnosis not present

## 2015-07-19 MED ORDER — SODIUM PERTECHNETATE TC 99M INJECTION
10.2000 | Freq: Once | INTRAVENOUS | Status: AC | PRN
  Administered 2015-07-19: 10 via INTRAVENOUS

## 2015-07-23 NOTE — Telephone Encounter (Signed)
Faxed form.

## 2015-07-24 ENCOUNTER — Other Ambulatory Visit: Payer: Self-pay | Admitting: Family Medicine

## 2015-07-24 DIAGNOSIS — E0590 Thyrotoxicosis, unspecified without thyrotoxic crisis or storm: Secondary | ICD-10-CM

## 2015-07-24 DIAGNOSIS — E0500 Thyrotoxicosis with diffuse goiter without thyrotoxic crisis or storm: Secondary | ICD-10-CM

## 2015-07-24 MED ORDER — METHIMAZOLE 5 MG PO TABS: 5.0000 mg | ORAL_TABLET | Freq: Every day | ORAL | Status: DC

## 2015-07-24 NOTE — Telephone Encounter (Signed)
Eliquis has been approved through insurance through 03/09/2016  Pt notified

## 2015-08-09 ENCOUNTER — Ambulatory Visit (INDEPENDENT_AMBULATORY_CARE_PROVIDER_SITE_OTHER): Payer: Medicare Other | Admitting: Cardiovascular Disease

## 2015-08-09 ENCOUNTER — Encounter: Payer: Self-pay | Admitting: Cardiovascular Disease

## 2015-08-09 VITALS — BP 114/72 | HR 134 | Ht 61.5 in | Wt 185.4 lb

## 2015-08-09 DIAGNOSIS — I480 Paroxysmal atrial fibrillation: Secondary | ICD-10-CM

## 2015-08-09 DIAGNOSIS — R0683 Snoring: Secondary | ICD-10-CM | POA: Diagnosis not present

## 2015-08-09 DIAGNOSIS — I10 Essential (primary) hypertension: Secondary | ICD-10-CM

## 2015-08-09 DIAGNOSIS — E0590 Thyrotoxicosis, unspecified without thyrotoxic crisis or storm: Secondary | ICD-10-CM | POA: Diagnosis not present

## 2015-08-09 DIAGNOSIS — I4891 Unspecified atrial fibrillation: Secondary | ICD-10-CM

## 2015-08-09 HISTORY — DX: Paroxysmal atrial fibrillation: I48.0

## 2015-08-09 HISTORY — DX: Thyrotoxicosis, unspecified without thyrotoxic crisis or storm: E05.90

## 2015-08-09 MED ORDER — HYDROCHLOROTHIAZIDE 12.5 MG PO CAPS: 12.5000 mg | ORAL_CAPSULE | Freq: Every day | ORAL | Status: DC

## 2015-08-09 MED ORDER — METOPROLOL SUCCINATE ER 100 MG PO TB24: 100.0000 mg | ORAL_TABLET | Freq: Every day | ORAL | Status: DC

## 2015-08-09 NOTE — Patient Instructions (Addendum)
Medication Instructions:  STOP LISINOPRIL-HCTZ   START HYDROCHLOROTHIAZIDE 12.5 MG DAILY   INCREASE YOUR METOPROLOL TO 100 MG DAILY   Labwork: NONE  Testing/Procedures: Your physician has requested that you have an echocardiogram. Echocardiography is a painless test that uses sound waves to create images of your heart. It provides your doctor with information about the size and shape of your heart and how well your heart's chambers and valves are working. This procedure takes approximately one hour. There are no restrictions for this procedure. [place] has recommended that you have a sleep study. This test records several body functions during sleep, including: brain activity, eye movement, oxygen and carbon dioxide blood levels, heart rate and rhythm, breathing rate and rhythm, the flow of air through your mouth and nose, snoring, body muscle movements, and chest and belly movement.  Follow-Up: Your physician recommends that you schedule a follow-up appointment in: 2 MONTH OV  If you need a refill on your cardiac medications before your next appointment, please call your pharmacy.

## 2015-08-09 NOTE — Progress Notes (Signed)
Cardiology Office Note   Date:  08/09/2015   ID:  Martha Perkins, DOB [birth year], MRN [id number]  PCP:  Beatrice Lecher, MD  Cardiologist:   Skeet Latch, MD   Chief Complaint  Patient presents with  . New Evaluation    Referred by Beatrice Lecher, MD for AFIB  pt states she can feel when she is in afib; SOB--chronic bronchitis, but has been worse; fatigue; mild swelling legs/feet/ankles--has not noticed an increase      History of Present Illness: Martha Perkins is a 70 y.o. female with paroxysmal atrial fibrillation, hypertension, hyperlipidemia, and fatty liver disease who presents to establish care for atrial fibrillation.  Martha Perkins was seen by her PCP, [name], on 07/13/15. At the end of April she was diagnosed with atrial fibrillation. She was started on Eliquis and a low-dose beta blocker. Lab work revealed that she was hyperthyroid.  She had a thyroid uptake scan that revealed an enlarged, hyperfunctioning left lobe of the thyroid gland without discrete nodule. Findings were thought to be consistent with Graves' disease.  She had one thyroid lobe removed in her 58s due to a benign thyroid nodule.  Her sister had Graves disease and she thinks that a cousin died of thyroid storm at age 57.    Martha Perkins started feeling jittery and tired around April 1st.  She notes occasional shortness of breath which has been worse lately.  She went to see her PCP her heart rate was initially in the 130s and then improved to the 90s by her second visit.  She continues to have palpitations that she feels more at night when She is laying down. She also endorses presyncope.  She has chronic lower extremity edema and orthopnea, that she attributes to her COPD.  She also endorses snoring and daytime somnolence.  Martha Perkins has not noted any chest pain.  She doesn't get much exercise but hopes to start soon.   Past Medical History  Diagnosis Date  . Hypertension   . Hyperlipidemia     . Thyroid disease   . Kidney stone   . Depression   . Paroxysmal atrial fibrillation ([facility]) 08/09/2015  . Hyperthyroidism 08/09/2015    Past Surgical History  Procedure Laterality Date  . Thyroidectomy  1971  . Tubal ligation  1979  . Replacement total knee bilateral  2011  . Lithotripsy    . Cataract extraction Left 09/2012    Dr. Bunnie Philips     Current Outpatient Prescriptions  Medication Sig Dispense Refill  . albuterol (PROAIR HFA) 108 (90 BASE) MCG/ACT inhaler Inhale 2 puffs into the lungs every 6 (six) hours as needed for wheezing or shortness of breath. 18 each 3  . apixaban (ELIQUIS) 5 MG TABS tablet Take 1 tablet (5 mg total) by mouth 2 (two) times daily. 60 tablet 2  . atorvastatin (LIPITOR) 40 MG tablet Take 1 tablet (40 mg total) by mouth daily. 90 tablet 3  . esomeprazole (NEXIUM) 40 MG capsule Take 1 capsule (40 mg total) by mouth daily. 30 capsule 6  . fluticasone (FLONASE) 50 MCG/ACT nasal spray One spray in each nostril twice a day, use left hand for right nostril, and right hand for left nostril. 48 g 3  . ipratropium (ATROVENT) 0.06 % nasal spray     . ketoconazole (NIZORAL) 2 % cream APPLY   TOPICALLY ONCE DAILY (Patient taking differently: APPLY   TOPICALLY as needed) 30 g 1  . methimazole (TAPAZOLE) 5 MG tablet  Take 1 tablet (5 mg total) by mouth daily. 90 tablet 2  . metoprolol succinate (TOPROL-XL) 100 MG 24 hr tablet Take 1 tablet (100 mg total) by mouth daily. 90 tablet 1  . traMADol (ULTRAM) 50 MG tablet Take 1 tablet (50 mg total) by mouth 3 (three) times daily. 90 tablet 3  . hydrochlorothiazide (MICROZIDE) 12.5 MG capsule Take 1 capsule (12.5 mg total) by mouth daily. 90 capsule 3   No current facility-administered medications for this visit.    Allergies:   Demerol; Meperidine hcl; Oxycodone-aspirin; and Percocet    Social History:  The patient  reports that she has never smoked. She does not have any smokeless tobacco history on file. She reports  that she does not drink alcohol or use illicit drugs.   Family History:  The patient's family history includes Coronary artery disease (age of onset: 53) in her mother; Depression in her mother; Diabetes in her paternal grandmother; Hyperlipidemia in her mother.    ROS:  Please see the history of present illness.   Otherwise, review of systems are positive for none.   All other systems are reviewed and negative.    PHYSICAL EXAM: VS:  BP 114/72 mmHg  Pulse 134  Ht 5' 1.5" (1.562 m)  Wt 185 lb 6.4 oz (84.097 kg)  BMI 34.47 kg/m2 , BMI Body mass index is 34.47 kg/(m^2). GENERAL:  Well appearing HEENT:  Pupils equal round and reactive, fundi not visualized, oral mucosa unremarkable NECK:  No jugular venous distention, waveform within normal limits, carotid upstroke brisk and symmetric, no bruits, no thyromegaly LYMPHATICS:  No cervical adenopathy LUNGS:  Clear to auscultation bilaterally HEART:  Irregularly irregular. PMI not displaced or sustained,S1 and S2 within normal limits, no S3, no S4, no clicks, no rubs, I/VI systolic murmur at the LUSB.  ABD:  Flat, positive bowel sounds normal in frequency in pitch, no bruits, no rebound, no guarding, no midline pulsatile mass, no hepatomegaly, no splenomegaly EXT:  2 plus pulses throughout, no edema, no cyanosis no clubbing SKIN:  No rashes no nodules NEURO:  Cranial nerves II through XII grossly intact, motor grossly intact throughout PSYCH:  Cognitively intact, oriented to person place and time   EKG:  EKG is ordered today. The ekg ordered today demonstatrial fibrillation. Rate 134 bpm. PVC. Prior septal infarct.   Recent Labs: 07/02/2015: ALT 28; BUN 20; Creat 0.56; Hemoglobin 12.4; Platelets 180; Potassium 3.6; Sodium 141; TSH <0.01*    Lipid Panel    Component Value Date/Time   CHOL 124* 07/02/2015 0959   TRIG 170* 07/02/2015 0959   HDL 31* 07/02/2015 0959   CHOLHDL 4.0 07/02/2015 0959   VLDL 34* 07/02/2015 0959   LDLCALC 59  07/02/2015 0959      Wt Readings from Last 3 Encounters:  08/09/15 185 lb 6.4 oz (84.097 kg)  07/13/15 189 lb (85.73 kg)  07/02/15 194 lb (87.998 kg)      ASSESSMENT AND PLAN:  # Atrial fibrillation: Martha Perkins has atrial fibrillation that is likely attributable to her hyperthyroidism. She also endorses snoring and daytime somnolence. We will check a sleep study to ensure that she does not have obstructive sleep apnea. We will also obtain an echocardiogram. Her rate is not well-controlled. We will increase metoprolol to 50 mg daily.  We will not consider a rhythm control strategy until her thyroid is under control. Continue Eliquis for anticoagulation. This patients CHA2DS2-VASc Score and unadjusted Ischemic Stroke Rate (% per year) is equal to  3.2 % stroke rate/year from a score of 3  Above score calculated as 1 point each if present [CHF, HTN, DM, Vascular=MI/PAD/Aortic Plaque, Age if 65-74, or Female] Above score calculated as 2 points each if present [Age > 75, or Stroke/TIA/TE]  # Hypertension:  BP is well-controlled.  However, I'm concerned that increasing her metoprolol may lead to hypotension. We will stop her lisinopril/HCTZ combination tablet. She will take HCTZ 12.5 mg instead. Increase metoprolol to 100 mg daily as above.     Current medicines are reviewed at length with the patient today.  The patient does not have concerns regarding medicines.  The following changes have been made:  Increase metoprolol and stop lisinopril.   Labs/ tests ordered today include:   Orders Placed This Encounter  Procedures  . EKG 12-Lead  . ECHOCARDIOGRAM COMPLETE  . Split night study     Disposition:   FU with Lyllie Cobbins C. Oval Linsey, MD, [facility] in 2 months.     This note was written with the assistance of speech recognition software.  Please excuse any transcriptional errors.  Signed, Richelle Glick C. Oval Linsey, MD, [facility]  08/09/2015 11:37 AM    [facility]

## 2015-08-10 ENCOUNTER — Ambulatory Visit (INDEPENDENT_AMBULATORY_CARE_PROVIDER_SITE_OTHER): Payer: Medicare Other | Admitting: Family Medicine

## 2015-08-10 ENCOUNTER — Encounter: Payer: Self-pay | Admitting: Family Medicine

## 2015-08-10 VITALS — BP 120/69 | HR 111 | Wt 187.0 lb

## 2015-08-10 DIAGNOSIS — I4891 Unspecified atrial fibrillation: Secondary | ICD-10-CM

## 2015-08-10 DIAGNOSIS — E0590 Thyrotoxicosis, unspecified without thyrotoxic crisis or storm: Secondary | ICD-10-CM

## 2015-08-10 LAB — T4, FREE: Free T4: 2.1 ng/dL — ABNORMAL HIGH (ref 0.8–1.8)

## 2015-08-10 LAB — TSH: TSH: <0.01 mIU/L — ABNORMAL LOW

## 2015-08-10 MED ORDER — METHIMAZOLE 5 MG PO TABS: 5.0000 mg | ORAL_TABLET | Freq: Two times a day (BID) | ORAL | Status: DC

## 2015-08-10 NOTE — Progress Notes (Signed)
Subjective:    CC: New onset Afib  HPI:  Afib - Atrial Fibrillation pt reports that @ the cardiologist yesterday her pulse was 134 they increased her metoprolol and stopped her lisinopril.    Hyperthyroidism - She said she's really not feeling much better. She still feels jittery, week, frequent palpitations and very fatigued. She's currently on methimazole 5 mg daily but hasn't noticed a big difference since starting it. She's been on it for a couple of weeks at this point.  Past medical history, Surgical history, Family history not pertinant except as noted below, Social history, Allergies, and medications have been entered into the medical record, reviewed, and corrections made.   Review of Systems: No fevers, chills, night sweats, weight loss, chest pain, or shortness of breath.   Objective:    General: Well Developed, well nourished, and in no acute distress.  Neuro: Alert and oriented x3, extra-ocular muscles intact, sensation grossly intact.  HEENT: Normocephalic, atraumatic  Skin: Warm and dry, no rashes. Cardiac: Regular rate and rhythm, no murmurs rubs or gallops, no lower extremity edema.  Respiratory: Clear to auscultation bilaterally. Not using accessory muscles, speaking in full sentences. Neck: no TM.     Impression and Recommendations:    Atrial fibrillation-just saw cardiology yesterday and increase her metoprolol. She will stop her low simple HCTZ so that she doesn't become hypotensive.  Hyperthyroid - increase methimazole to BID.  Keep appointment in 2 weeks with endocrinology for consultation. She can certainly discuss additional options and workup at that time.

## 2015-08-14 ENCOUNTER — Telehealth: Payer: Self-pay

## 2015-08-14 NOTE — Telephone Encounter (Signed)
Martha Perkins called and states she is having low back and hip pain. She was taken off mobic due to the Eliquis. She is in a lot of pain and the tramadol is not helping. Is there something else she can take other than the mobic? Please advise.

## 2015-08-14 NOTE — Telephone Encounter (Signed)
Lets get her in with sports med.  If tramadol not working then really only leaves narcotics adn I want to make sure we have tried other things besides this.

## 2015-08-15 ENCOUNTER — Encounter: Payer: Self-pay | Admitting: Family Medicine

## 2015-08-15 ENCOUNTER — Ambulatory Visit (INDEPENDENT_AMBULATORY_CARE_PROVIDER_SITE_OTHER): Payer: Medicare Other | Admitting: Family Medicine

## 2015-08-15 VITALS — BP 137/82 | HR 112 | Wt 182.0 lb

## 2015-08-15 DIAGNOSIS — S39012A Strain of muscle, fascia and tendon of lower back, initial encounter: Secondary | ICD-10-CM

## 2015-08-15 DIAGNOSIS — S338XXA Sprain of other parts of lumbar spine and pelvis, initial encounter: Secondary | ICD-10-CM | POA: Diagnosis not present

## 2015-08-15 DIAGNOSIS — M5136 Other intervertebral disc degeneration, lumbar region: Secondary | ICD-10-CM | POA: Insufficient documentation

## 2015-08-15 NOTE — Progress Notes (Signed)
Martha Perkins is a 70 y.o. female who presents to [facility] today for right low back pain present for 3 weeks. Patient denies any specific injury. No radiating pain weakness or numbness. Pain is located in the right low back extending into the buttocks. Pain is quite severe and interfering with her ability to move and sleep comfortably. She takes tramadol which helps only a little. She recently was advised to discontinue meloxicam due to potential increased risk for bleeding while on Eliquis. She notes increase in overall pain since she discontinued the meloxicam. She notes her heart rate is typically elevated due to her atrial fibrillation.   Past Medical History  Diagnosis Date  . Hypertension   . Hyperlipidemia   . Thyroid disease   . Kidney stone   . Depression   . Paroxysmal atrial fibrillation ([place]) 08/09/2015  . Hyperthyroidism 08/09/2015   Past Surgical History  Procedure Laterality Date  . Thyroidectomy  1971  . Tubal ligation  1979  . Replacement total knee bilateral  2011  . Lithotripsy    . Cataract extraction Left 09/2012    Dr. Bunnie Philips   Social History  Substance Use Topics  . Smoking status: Never Smoker   . Smokeless tobacco: Not on file  . Alcohol Use: No   family history includes Coronary artery disease (age of onset: 61) in her mother; Depression in her mother; Diabetes in her paternal grandmother; Hyperlipidemia in her mother.  ROS:  No headache, visual changes, nausea, vomiting, diarrhea, constipation, dizziness, abdominal pain, skin rash, fevers, chills, night sweats, weight loss, swollen lymph nodes, body aches, joint swelling, muscle aches, chest pain, shortness of breath, mood changes, visual or auditory hallucinations.    Medications: Current Outpatient Prescriptions  Medication Sig Dispense Refill  . albuterol (PROAIR HFA) 108 (90 BASE) MCG/ACT inhaler Inhale 2 puffs into the lungs every 6 (six) hours  as needed for wheezing or shortness of breath. 18 each 3  . apixaban (ELIQUIS) 5 MG TABS tablet Take 1 tablet (5 mg total) by mouth 2 (two) times daily. 60 tablet 2  . atorvastatin (LIPITOR) 40 MG tablet Take 1 tablet (40 mg total) by mouth daily. 90 tablet 3  . esomeprazole (NEXIUM) 40 MG capsule Take 1 capsule (40 mg total) by mouth daily. 30 capsule 6  . fluticasone (FLONASE) 50 MCG/ACT nasal spray One spray in each nostril twice a day, use left hand for right nostril, and right hand for left nostril. 48 g 3  . hydrochlorothiazide (MICROZIDE) 12.5 MG capsule Take 1 capsule (12.5 mg total) by mouth daily. 90 capsule 3  . ipratropium (ATROVENT) 0.06 % nasal spray     . ketoconazole (NIZORAL) 2 % cream APPLY   TOPICALLY ONCE DAILY (Patient taking differently: APPLY   TOPICALLY as needed) 30 g 1  . methimazole (TAPAZOLE) 5 MG tablet Take 1 tablet (5 mg total) by mouth 2 (two) times daily. 60 tablet 0  . metoprolol succinate (TOPROL-XL) 100 MG 24 hr tablet Take 1 tablet (100 mg total) by mouth daily. 90 tablet 1  . traMADol (ULTRAM) 50 MG tablet Take 1 tablet (50 mg total) by mouth 3 (three) times daily. 90 tablet 3   No current facility-administered medications for this visit.   Allergies  Allergen Reactions  . Demerol Nausea Only  . Meperidine Hcl   . Oxycodone-Aspirin   . Percocet [Oxycodone-Acetaminophen]      Exam:  BP 137/82 mmHg  Pulse 112  Wt 182 lb (82.555 kg) General: Well Developed, well nourished, and in no acute distress.  Neuro/Psych: Alert and oriented x3, extra-ocular muscles intact, able to move all 4 extremities, sensation grossly intact. Skin: Warm and dry, no rashes noted.  Respiratory: Not using accessory muscles, speaking in full sentences, trachea midline.  Cardiovascular: Pulses palpable, no extremity edema. Abdomen: Does not appear distended. MSK: Back: Nontender to spinal midline thoracic and lumbar midline. Tender palpation right SI joint. Back motion  significantly limited by pain. Lower extremity strength is equal and normal throughout however significant pain with motion. Reflexes are equal and normal throughout. Sensation is intact throughout.  Procedure: Real-time Ultrasound Guided Injection of right SI joint  Device: GE Logiq E  Images permanently stored and available for review in the ultrasound unit. Verbal informed consent obtained. Discussed risks and benefits of procedure. Warned about infection bleeding damage to structures skin hypopigmentation and fat atrophy among others. Patient expresses understanding and agreement Time-out conducted.  Noted no overlying erythema, induration, or other signs of local infection.  Skin prepped in a sterile fashion.  Local anesthesia: Topical Ethyl chloride.  With sterile technique and under real time ultrasound guidance: 80 mg of Kenalog and 4 mL of lidocaine injected easily.  Completed without difficulty  Pain partially resolved suggesting accurate placement of the medication.  Advised to call if fevers/chills, erythema, induration, drainage, or persistent bleeding.  Images permanently stored and available for review in the ultrasound unit.  Impression: Technically successful ultrasound guided injection.     No results found for this or any previous visit (from the past 24 hour(s)). No results found.   70 year old woman with back pain attributable to lumbar sacral strain and SI joint pain. Patient is very reluctant to attend physical therapy however think this is the best option. She would like an SI injection today. Possible feel this is reasonable. Injection performed and showed some improvement in pain. Additionally recommended attending physical therapy. Additionally treat with continue tramadol heating pad at home exercise program.  Recheck in a few weeks.  We had a lengthy discussion about risks versus benefit of meloxicam while on Eliquis. I think her risk of  falling and suffering head injury due to tramadol is probably higher than the risk of bleeding due to Eliquis. I think it's probably reasonable to be on meloxicam and she experienced significant benefit from this medicine as trouble finding a good alternative.  CC: METHENEY,CATHERINE, MD

## 2015-08-15 NOTE — Patient Instructions (Signed)
Thank you for coming in today. I have referred you to PT at [facility].  I strongly recommend that you go to PT.  The risk of Meloxicam with Eliquis is medium. If the risk of pain and falls due to tramadol is more than the modest risk of bleeding with meloxicam and Eliquis you should consider taking the meloxicam.  Return in 2-4 weeks.  Call or go to the ER if you develop a large red swollen joint with extreme pain or oozing puss.   Lumbosacral Strain Lumbosacral strain is a strain of any of the parts that make up your lumbosacral vertebrae. Your lumbosacral vertebrae are the bones that make up the lower third of your backbone. Your lumbosacral vertebrae are held together by muscles and tough, fibrous tissue (ligaments).  CAUSES  A sudden blow to your back can cause lumbosacral strain. Also, anything that causes an excessive stretch of the muscles in the low back can cause this strain. This is typically seen when people exert themselves strenuously, fall, lift heavy objects, bend, or crouch repeatedly. RISK FACTORS  Physically demanding work.  Participation in pushing or pulling sports or sports that require a sudden twist of the back (tennis, golf, baseball).  Weight lifting.  Excessive lower back curvature.  Forward-tilted pelvis.  Weak back or abdominal muscles or both.  Tight hamstrings. SIGNS AND SYMPTOMS  Lumbosacral strain may cause pain in the area of your injury or pain that moves (radiates) down your leg.  DIAGNOSIS Your health care provider can often diagnose lumbosacral strain through a physical exam. In some cases, you may need tests such as X-ray exams.  TREATMENT  Treatment for your lower back injury depends on many factors that your clinician will have to evaluate. However, most treatment will include the use of anti-inflammatory medicines. HOME CARE INSTRUCTIONS   Avoid hard physical activities (tennis, racquetball, waterskiing) if you are not in proper  physical condition for it. This may aggravate or create problems.  If you have a back problem, avoid sports requiring sudden body movements. Swimming and walking are generally safer activities.  Maintain good posture.  Maintain a healthy weight.  For acute conditions, you may put ice on the injured area.  Put ice in a plastic bag.  Place a towel between your skin and the bag.  Leave the ice on for 20 minutes, 2-3 times a day.  When the low back starts healing, stretching and strengthening exercises may be recommended. SEEK MEDICAL CARE IF:  Your back pain is getting worse.  You experience severe back pain not relieved with medicines. SEEK IMMEDIATE MEDICAL CARE IF:   You have numbness, tingling, weakness, or problems with the use of your arms or legs.  There is a change in bowel or bladder control.  You have increasing pain in any area of the body, including your belly (abdomen).  You notice shortness of breath, dizziness, or feel faint.  You feel sick to your stomach (nauseous), are throwing up (vomiting), or become sweaty.  You notice discoloration of your toes or legs, or your feet get very cold. MAKE SURE YOU:   Understand these instructions.  Will watch your condition.  Will get help right away if you are not doing well or get worse.   This information is not intended to replace advice given to you by your health care provider. Make sure you discuss any questions you have with your health care provider.   Document Released: 12/04/2004 Document Revised: 03/17/2014 Document  Reviewed: 10/13/2012 Elsevier Interactive Patient Education [employer].

## 2015-08-15 NOTE — Telephone Encounter (Signed)
Patient scheduled to see Dr Georgina Snell.

## 2015-08-22 ENCOUNTER — Other Ambulatory Visit: Payer: Self-pay | Admitting: Family Medicine

## 2015-08-23 ENCOUNTER — Ambulatory Visit (INDEPENDENT_AMBULATORY_CARE_PROVIDER_SITE_OTHER): Payer: Medicare Other | Admitting: Endocrinology

## 2015-08-23 ENCOUNTER — Encounter: Payer: Self-pay | Admitting: Endocrinology

## 2015-08-23 VITALS — BP 122/90 | HR 130 | Temp 98.0°F | Ht 62.0 in | Wt 180.8 lb

## 2015-08-23 DIAGNOSIS — E0590 Thyrotoxicosis, unspecified without thyrotoxic crisis or storm: Secondary | ICD-10-CM | POA: Diagnosis not present

## 2015-08-23 MED ORDER — METHIMAZOLE 10 MG PO TABS: 40.0000 mg | ORAL_TABLET | Freq: Two times a day (BID) | ORAL | Status: DC

## 2015-08-23 NOTE — Progress Notes (Signed)
Subjective:    Patient ID: Martha Perkins, female    DOB: Aug 19, 1945, 70 y.o.   MRN: [id number]  HPI Pt had partial right thyroid lobectomy in 1970.  She reports he was dx'ed with hyperthyroidism in April, 2017, when she was found to have AF.  She was rx'ed tapazole and toprol.   she has never had XRT to the anterior neck.   she does not consume kelp or any other prescribed or non-prescribed thyroid medication.  she has never been on amiodarone.  She reports moderate palpitations in the chest, and assoc tremor.  Past Medical History  Diagnosis Date  . Hypertension   . Hyperlipidemia   . Thyroid disease   . Kidney stone   . Depression   . Paroxysmal atrial fibrillation ([place]) 08/09/2015  . Hyperthyroidism 08/09/2015    Past Surgical History  Procedure Laterality Date  . Thyroidectomy  1971  . Tubal ligation  1979  . Replacement total knee bilateral  2011  . Lithotripsy    . Cataract extraction Left 09/2012    Dr. Bunnie Philips    Social History   Social History  . Marital Status: Married    Spouse Name: N/A  . Number of Children: N/A  . Years of Education: N/A   Occupational History  . Retired Therapist, sports.     Social History Main Topics  . Smoking status: Never Smoker   . Smokeless tobacco: Not on file  . Alcohol Use: No  . Drug Use: No  . Sexual Activity: Not on file   Other Topics Concern  . Not on file   Social History Narrative    Current Outpatient Prescriptions on File Prior to Visit  Medication Sig Dispense Refill  . albuterol (PROAIR HFA) 108 (90 BASE) MCG/ACT inhaler Inhale 2 puffs into the lungs every 6 (six) hours as needed for wheezing or shortness of breath. 18 each 3  . apixaban (ELIQUIS) 5 MG TABS tablet Take 1 tablet (5 mg total) by mouth 2 (two) times daily. 60 tablet 2  . atorvastatin (LIPITOR) 40 MG tablet TAKE ONE TABLET BY MOUTH ONCE DAILY 90 tablet 0  . esomeprazole (NEXIUM) 40 MG capsule Take 1 capsule (40 mg total) by mouth daily. 30 capsule 6  .  fluticasone (FLONASE) 50 MCG/ACT nasal spray One spray in each nostril twice a day, use left hand for right nostril, and right hand for left nostril. 48 g 3  . hydrochlorothiazide (MICROZIDE) 12.5 MG capsule Take 1 capsule (12.5 mg total) by mouth daily. 90 capsule 3  . ipratropium (ATROVENT) 0.06 % nasal spray     . ketoconazole (NIZORAL) 2 % cream APPLY   TOPICALLY ONCE DAILY (Patient taking differently: APPLY   TOPICALLY as needed) 30 g 1  . metoprolol succinate (TOPROL-XL) 100 MG 24 hr tablet Take 1 tablet (100 mg total) by mouth daily. 90 tablet 1  . traMADol (ULTRAM) 50 MG tablet Take 1 tablet (50 mg total) by mouth 3 (three) times daily. 90 tablet 3   No current facility-administered medications on file prior to visit.    Allergies  Allergen Reactions  . Demerol Nausea Only  . Meperidine Hcl   . Oxycodone-Aspirin   . Percocet [Oxycodone-Acetaminophen]     Family History  Problem Relation Age of Onset  . Coronary artery disease Mother 17    deceased  . Depression Mother   . Diabetes Paternal Grandmother   . Hyperlipidemia Mother   . Thyroid disease Sister  BP 122/90 mmHg  Pulse 130  Temp(Src) 98 F (36.7 C)  Ht 5\' 2"  (1.575 m)  Wt 180 lb 12.8 oz (82.01 kg)  BMI 33.06 kg/m2  SpO2 99%    Review of Systems denies headache, hoarseness, visual loss, sob, diarrhea, polyuria, numbness, easy bruising, and rhinorrhea. She has lost 14 lbs.  No change in chronic edema, excessive diaphoresis, anxiety, heat intolerance, and muscle weakness.     Objective:   Physical Exam VS: see vs page GEN: no distress HEAD: head: no deformity eyes: no periorbital swelling, no proptosis external nose and ears are normal mouth: no lesion seen NECK: a healed scar is present.  Large left thyroid mass CHEST WALL: no deformity LUNGS: clear to auscultation CV: tachycardic rate and irreg rhythm, no murmur ABD: abdomen is soft, nontender.  no hepatosplenomegaly.  not distended.  no  hernia MUSCULOSKELETAL: muscle bulk and strength are grossly normal.  no obvious joint swelling.  gait is normal and steady EXTEMITIES: no deformity.  no ulcer on the feet.  feet are of normal color and temp.  Trace bilat leg edema PULSES: dorsalis pedis intact bilat.  no carotid bruit NEURO:  cn 2-12 grossly intact.   readily moves all 4's.  sensation is intact to touch on the feet SKIN:  Normal texture and temperature.  No rash or suspicious lesion is visible.   NODES:  None palpable at the neck PSYCH: alert, well-oriented.  Does not appear anxious nor depressed.   nuc [id number] hyperfunctioning LEFT lobe of thyroid gland without discrete nodularity. Findings would favor Graves disease if patient. RIGHT partial hemithyroidectomy.    i personally reviewed electrocardiogram tracing (08/09/15): Indication: AF Impression: AF  Lab Results  Component Value Date   TSH <0.01* 08/10/2015   T3TOTAL 319.0* 07/02/2015      Assessment & Plan:  Hyperthyroidism, new AF: in this setting, she needs prompt improvement in TFT, so tapazole is chosen as initial rx.    Patient is advised the following: Patient Instructions  Please increase the methimazole.  i have sent a prescription to your pharmacy.   if ever you have fever while taking methimazole, stop it and call us, even if the reason is obvious, because of the risk of a rare side-effect. Please come back for a follow-up appointment in 2-3 weeks. Please continue the same metoprolol.   We'll do the radioactive iodine as soon as we can.  Renato Shin, MD

## 2015-08-23 NOTE — Patient Instructions (Addendum)
Please increase the methimazole.  i have sent a prescription to your pharmacy.   if ever you have fever while taking methimazole, stop it and call us, even if the reason is obvious, because of the risk of a rare side-effect. Please come back for a follow-up appointment in 2-3 weeks. Please continue the same metoprolol.   We'll do the radioactive iodine as soon as we can.

## 2015-08-27 ENCOUNTER — Encounter: Payer: Self-pay | Admitting: Physician Assistant

## 2015-08-27 ENCOUNTER — Ambulatory Visit (INDEPENDENT_AMBULATORY_CARE_PROVIDER_SITE_OTHER): Payer: Medicare Other | Admitting: Physician Assistant

## 2015-08-27 VITALS — BP 133/92 | HR 111 | Ht 62.0 in | Wt 178.0 lb

## 2015-08-27 DIAGNOSIS — R300 Dysuria: Secondary | ICD-10-CM | POA: Diagnosis not present

## 2015-08-27 DIAGNOSIS — R319 Hematuria, unspecified: Secondary | ICD-10-CM | POA: Diagnosis not present

## 2015-08-27 DIAGNOSIS — N3001 Acute cystitis with hematuria: Secondary | ICD-10-CM

## 2015-08-27 DIAGNOSIS — R3915 Urgency of urination: Secondary | ICD-10-CM

## 2015-08-27 LAB — POCT URINALYSIS DIPSTICK
BILIRUBIN UA: NEGATIVE
GLUCOSE UA: 100
KETONES UA: NEGATIVE
NITRITE UA: POSITIVE
Spec Grav, UA: 1.01
Urobilinogen, UA: 1
pH, UA: 5

## 2015-08-27 MED ORDER — CIPROFLOXACIN HCL 500 MG PO TABS
500.0000 mg | ORAL_TABLET | Freq: Two times a day (BID) | ORAL | Status: DC
Start: 2015-08-27 — End: 2015-09-12

## 2015-08-27 NOTE — Progress Notes (Signed)
   Subjective:    Patient ID: Martha Perkins, female    DOB: 1946-01-11, 70 y.o.   MRN: [id number]  HPI  Pt is a 70 yo female who presents to the clinic with 4 days of urinary urgency and dysuria. She denies any fever, chills, nausea, diarrhea, constipation, body aches, flank pain or abdominal pain. Azo tried and helped some. Not aware of any trigger.    Review of Systems  All other systems reviewed and are negative.      Objective:   Physical Exam  Constitutional: She is oriented to person, place, and time. She appears well-developed and well-nourished.  HENT:  Head: Normocephalic and atraumatic.  Eyes: Conjunctivae are normal.  Neck: Normal range of motion. Neck supple.  Cardiovascular: Normal rate, regular rhythm and normal heart sounds.   Pulmonary/Chest: Effort normal and breath sounds normal. She has no wheezes.  No CVA tenderness.   Abdominal: Soft. Bowel sounds are normal. She exhibits no distension and no mass. There is no tenderness. There is no rebound and no guarding.  Lymphadenopathy:    She has no cervical adenopathy.  Neurological: She is alert and oriented to person, place, and time.  Psychiatric: She has a normal mood and affect. Her behavior is normal.          Assessment & Plan:  Acute cystitis with hematuria- .. Results for orders placed or performed in visit on 08/27/15  POCT urinalysis dipstick  Result Value Ref Range   Color, UA orange    Clarity, UA clear    Glucose, UA 100    Bilirubin, UA neg    Ketones, UA neg    Spec Grav, UA 1.010    Blood, UA trace-lysed    pH, UA 5.0    Protein, UA trace    Urobilinogen, UA 1.0    Nitrite, UA pos    Leukocytes, UA Trace (A) Negative   Will culture. cipro sent to pharmacy. Discussed symptomatic care. Stay hydrated.

## 2015-08-27 NOTE — Patient Instructions (Signed)

## 2015-08-29 ENCOUNTER — Ambulatory Visit (HOSPITAL_COMMUNITY): Payer: Medicare Other | Attending: Cardiovascular Disease

## 2015-08-29 ENCOUNTER — Other Ambulatory Visit: Payer: Self-pay

## 2015-08-29 DIAGNOSIS — I119 Hypertensive heart disease without heart failure: Secondary | ICD-10-CM | POA: Diagnosis not present

## 2015-08-29 DIAGNOSIS — I340 Nonrheumatic mitral (valve) insufficiency: Secondary | ICD-10-CM | POA: Diagnosis not present

## 2015-08-29 DIAGNOSIS — I071 Rheumatic tricuspid insufficiency: Secondary | ICD-10-CM | POA: Diagnosis not present

## 2015-08-29 DIAGNOSIS — I4891 Unspecified atrial fibrillation: Secondary | ICD-10-CM | POA: Diagnosis not present

## 2015-08-29 LAB — ECHOCARDIOGRAM COMPLETE
CHL CUP DOP CALC LVOT VTI: 17.3 cm
CHL CUP TV REG PEAK VELOCITY: 261 cm/s
EERAT: 6.33
EWDT: 204 ms
FS: 36 % (ref 28–44)
IVS/LV PW RATIO, ED: 0.64
LA ID, A-P, ES: 40 mm
LA diam end sys: 40 mm
LA diam index: 2.2 cm/m2
LA vol A4C: 55 ml
LA vol index: 35.2 mL/m2
LA vol: 64 mL
LV TDI E'LATERAL: 16.6
LVEEAVG: 6.33
LVEEMED: 6.33
LVELAT: 16.6 cm/s
LVOT area: 2.84 cm2
LVOT diameter: 19 mm
LVOT peak vel: 103 cm/s
LVOTSV: 49 mL
MV Dec: 204
MVPG: 4 mmHg
MVPKEVEL: 105 m/s
PW: 11.1 mm — AB (ref 0.6–1.1)
TDI e' medial: 12.3
TRMAXVEL: 261 cm/s

## 2015-08-30 LAB — URINE CULTURE

## 2015-09-04 ENCOUNTER — Ambulatory Visit: Payer: Self-pay | Admitting: Family Medicine

## 2015-09-12 ENCOUNTER — Ambulatory Visit (INDEPENDENT_AMBULATORY_CARE_PROVIDER_SITE_OTHER): Payer: Medicare Other | Admitting: Endocrinology

## 2015-09-12 ENCOUNTER — Encounter: Payer: Self-pay | Admitting: Endocrinology

## 2015-09-12 VITALS — BP 130/72 | HR 94 | Ht 62.0 in | Wt 183.0 lb

## 2015-09-12 DIAGNOSIS — E0590 Thyrotoxicosis, unspecified without thyrotoxic crisis or storm: Secondary | ICD-10-CM | POA: Diagnosis not present

## 2015-09-12 LAB — T4, FREE: FREE T4: 0.75 ng/dL (ref 0.60–1.60)

## 2015-09-12 LAB — TSH: TSH: 0.07 u[IU]/mL — ABNORMAL LOW (ref 0.35–4.50)

## 2015-09-12 NOTE — Patient Instructions (Addendum)
blood tests are requested for you today.  We'll let you know about the results.   if ever you have fever while taking methimazole, stop it and call us, even if the reason is obvious, because of the risk of a rare side-effect. Please continue the same metoprolol.   We'll do the radioactive iodine as soon as we can.

## 2015-09-12 NOTE — Progress Notes (Signed)
Subjective:    Patient ID: Martha Perkins, female    DOB: Oct 28, 1945, 70 y.o.   MRN: [id number]  HPI Pt returns for f/u of hyperthyroidism (dx'ed April, 2017, in w/u of AF; she had partial right thyroid lobectomy in 1970; she was rx'ed tapazole and toprol, due to the need for prompt improvement).  Since on tapazole, pt states she feels better in general.  Past Medical History  Diagnosis Date  . Hypertension   . Hyperlipidemia   . Thyroid disease   . Kidney stone   . Depression   . Paroxysmal atrial fibrillation ([place]) 08/09/2015  . Hyperthyroidism 08/09/2015    Past Surgical History  Procedure Laterality Date  . Thyroidectomy  1971  . Tubal ligation  1979  . Replacement total knee bilateral  2011  . Lithotripsy    . Cataract extraction Left 09/2012    Dr. Bunnie Philips    Social History   Social History  . Marital Status: Married    Spouse Name: N/A  . Number of Children: N/A  . Years of Education: N/A   Occupational History  . Retired Therapist, sports.     Social History Main Topics  . Smoking status: Never Smoker   . Smokeless tobacco: Not on file  . Alcohol Use: No  . Drug Use: No  . Sexual Activity: Not on file   Other Topics Concern  . Not on file   Social History Narrative    Current Outpatient Prescriptions on File Prior to Visit  Medication Sig Dispense Refill  . albuterol (PROAIR HFA) 108 (90 BASE) MCG/ACT inhaler Inhale 2 puffs into the lungs every 6 (six) hours as needed for wheezing or shortness of breath. 18 each 3  . apixaban (ELIQUIS) 5 MG TABS tablet Take 1 tablet (5 mg total) by mouth 2 (two) times daily. 60 tablet 2  . atorvastatin (LIPITOR) 40 MG tablet TAKE ONE TABLET BY MOUTH ONCE DAILY 90 tablet 0  . esomeprazole (NEXIUM) 40 MG capsule Take 1 capsule (40 mg total) by mouth daily. 30 capsule 6  . fluticasone (FLONASE) 50 MCG/ACT nasal spray One spray in each nostril twice a day, use left hand for right nostril, and right hand for left nostril. 48 g 3  .  hydrochlorothiazide (MICROZIDE) 12.5 MG capsule Take 1 capsule (12.5 mg total) by mouth daily. 90 capsule 3  . ketoconazole (NIZORAL) 2 % cream APPLY   TOPICALLY ONCE DAILY (Patient taking differently: APPLY   TOPICALLY as needed) 30 g 1  . metoprolol succinate (TOPROL-XL) 100 MG 24 hr tablet Take 1 tablet (100 mg total) by mouth daily. 90 tablet 1  . traMADol (ULTRAM) 50 MG tablet Take 1 tablet (50 mg total) by mouth 3 (three) times daily. 90 tablet 3  . ipratropium (ATROVENT) 0.06 % nasal spray Reported on 09/12/2015     No current facility-administered medications on file prior to visit.    Allergies  Allergen Reactions  . Demerol Nausea Only  . Meperidine Hcl   . Oxycodone-Aspirin   . Percocet [Oxycodone-Acetaminophen]     Family History  Problem Relation Age of Onset  . Coronary artery disease Mother 17    deceased  . Depression Mother   . Diabetes Paternal Grandmother   . Hyperlipidemia Mother   . Thyroid disease Sister     BP 130/72 mmHg  Pulse 94  Ht 5\' 2"  (1.575 m)  Wt 183 lb (83.008 kg)  BMI 33.46 kg/m2  SpO2 96%  Review of Systems  Denies fever    Objective:   Physical Exam VITAL SIGNS:  See vs page GENERAL: no distress NECK: a healed scar is present.  Large left thyroid mass is again noted  Lab Results  Component Value Date   TSH 0.07* 09/12/2015   T3TOTAL 319.0* 07/02/2015      Assessment & Plan:  Hyperthyroidism: improved enough for RAI rx.  D/c tapazole.  Please continue the same toprol.  I ordered scan.    Patient is advised the following: Patient Instructions  blood tests are requested for you today.  We'll let you know about the results.   if ever you have fever while taking methimazole, stop it and call us, even if the reason is obvious, because of the risk of a rare side-effect. Please continue the same metoprolol.   We'll do the radioactive iodine as soon as we can.     Renato Shin, MD

## 2015-09-13 ENCOUNTER — Other Ambulatory Visit: Payer: Self-pay | Admitting: Endocrinology

## 2015-09-13 ENCOUNTER — Telehealth: Payer: Self-pay | Admitting: Endocrinology

## 2015-09-13 DIAGNOSIS — E0590 Thyrotoxicosis, unspecified without thyrotoxic crisis or storm: Secondary | ICD-10-CM

## 2015-09-13 NOTE — Telephone Encounter (Signed)
I contacted the pt and left a vm advising of the note below. Requested a call back if the pt would like to discuss.

## 2015-09-13 NOTE — Telephone Encounter (Signed)
please call patient: I ordered radioactive iodine pill 3 days after, please resume the methimazole pill at 10 mg, twice a day. Then please return here approx 2-4 weeks later.

## 2015-09-14 ENCOUNTER — Telehealth: Payer: Self-pay

## 2015-09-14 NOTE — Telephone Encounter (Signed)
Called and spoke with patient. Advised her to stop taking the methimazole. Patient stated she would be on the look out for a call to schedule the nuclear medicine scan. No other questions or concerns,

## 2015-09-18 ENCOUNTER — Other Ambulatory Visit: Payer: Self-pay | Admitting: Family Medicine

## 2015-09-18 ENCOUNTER — Telehealth: Payer: Self-pay | Admitting: Endocrinology

## 2015-09-18 NOTE — Telephone Encounter (Signed)
yes

## 2015-09-18 NOTE — Telephone Encounter (Signed)
RAI is scheduled for 10/04/15 radiology told pt she did not need a scan because she just had one in may. Can she cancel that appt?

## 2015-09-18 NOTE — Telephone Encounter (Signed)
I contacted the pt and advised per MD she can cancel the scan. Pt verbalized understanding.

## 2015-09-26 ENCOUNTER — Ambulatory Visit (INDEPENDENT_AMBULATORY_CARE_PROVIDER_SITE_OTHER): Payer: Medicare Other | Admitting: Family Medicine

## 2015-09-26 VITALS — BP 124/78 | HR 98 | Wt 185.0 lb

## 2015-09-26 DIAGNOSIS — J984 Other disorders of lung: Secondary | ICD-10-CM

## 2015-09-26 DIAGNOSIS — I4891 Unspecified atrial fibrillation: Secondary | ICD-10-CM | POA: Diagnosis not present

## 2015-09-26 DIAGNOSIS — R05 Cough: Secondary | ICD-10-CM

## 2015-09-26 DIAGNOSIS — R059 Cough, unspecified: Secondary | ICD-10-CM

## 2015-09-26 MED ORDER — ALBUTEROL SULFATE (2.5 MG/3ML) 0.083% IN NEBU
2.5000 mg | INHALATION_SOLUTION | Freq: Once | RESPIRATORY_TRACT | Status: AC
  Administered 2015-09-26: 2.5 mg via RESPIRATORY_TRACT

## 2015-09-26 NOTE — Progress Notes (Signed)
   Subjective:    Patient ID: Martha Perkins, female    DOB: Dec 31, 1945, 70 y.o.   MRN: [id number]  HPI Here for spirometry for recurrent bronchitis and chest xray had shown chronic bronchitis.  She does notice wheezing at night and will use her albuterol occ. She feels like it does help.She has personally never been a smoker.  Hyperthyroidism-she is scheduled for radiofrequency ablation in a couple of weeks. She is currently rate controlled on her metoprolol.  Atrial fibrillation-doing well overall. She's currently on metoprolol and Eliquis.   Review of Systems     Objective:   Physical Exam  Constitutional: She is oriented to person, place, and time. She appears well-developed and well-nourished.  HENT:  Head: Normocephalic and atraumatic.  Cardiovascular: Normal rate and normal heart sounds.   Irregular rhythm.  Pulmonary/Chest: Effort normal and breath sounds normal.  Neurological: She is alert and oriented to person, place, and time.  Skin: Skin is warm and dry.  Psychiatric: She has a normal mood and affect. Her behavior is normal.       Assessment & Plan:  Restrictive lung disease-FVC of 65%, FEV1 of 67% and ratio of 78%. No significant change with albuterol. From treatment consistent with moderate restrictive pulmonary disease.Since she is currently undergoing therapy for her hyperthyroidism and her atrial fibrillation we will hold off until the early fall for pulmonary referral. She can certainly continue to use her albuterol as needed since she does feel like it's helpful.  Hyperthyroidism-scheduled for radiofrequency ablation.  Thus she is off of her methimazole for now.  Atrial fib - stable. She is rate controlled on BB.

## 2015-09-26 NOTE — Progress Notes (Signed)
Patient came into clinic today for spirometry test. Pt reports she has chronic bronchitis but never smoked in her life. Pt does state she was raised by parents who did. Pt able to preform test successfully, no immediate complications. Pt was moved to a room where she can go over these results with PCP.

## 2015-09-28 ENCOUNTER — Encounter: Payer: Self-pay | Admitting: Family Medicine

## 2015-09-28 DIAGNOSIS — J984 Other disorders of lung: Secondary | ICD-10-CM | POA: Insufficient documentation

## 2015-10-01 ENCOUNTER — Ambulatory Visit (HOSPITAL_COMMUNITY): Payer: Medicare Other

## 2015-10-01 ENCOUNTER — Telehealth: Payer: Self-pay | Admitting: Cardiovascular Disease

## 2015-10-01 ENCOUNTER — Ambulatory Visit: Payer: Self-pay | Admitting: Family Medicine

## 2015-10-01 NOTE — Telephone Encounter (Signed)
Pt is scheduled for a radioactive Iodine on 10-04-15.They said she will not be able to be around people. Should she keep her appointment on 10-08-15? She does not know how many days she will have to be like this until this Thursday.

## 2015-10-01 NOTE — Telephone Encounter (Signed)
Returned call. Pt having radiation procedure for thyroid on Thursday. Encouraged her to call oncology office to get specific recommendations regarding length of time she will need to be isolated/advice on whether she would need to reschedule w/ [place] for later. appt kept for now. She will call, if needing to reschedule, once she has more information.

## 2015-10-02 ENCOUNTER — Encounter (HOSPITAL_COMMUNITY): Payer: Medicare Other

## 2015-10-04 ENCOUNTER — Other Ambulatory Visit: Payer: Self-pay

## 2015-10-04 ENCOUNTER — Encounter (HOSPITAL_COMMUNITY)
Admission: RE | Admit: 2015-10-04 | Discharge: 2015-10-04 | Disposition: A | Discharge status: Home / Self Care | Payer: Medicare Other | Source: Ambulatory Visit | Attending: Endocrinology | Admitting: Endocrinology

## 2015-10-04 ENCOUNTER — Encounter (HOSPITAL_BASED_OUTPATIENT_CLINIC_OR_DEPARTMENT_OTHER): Payer: Self-pay

## 2015-10-04 DIAGNOSIS — E0590 Thyrotoxicosis, unspecified without thyrotoxic crisis or storm: Secondary | ICD-10-CM | POA: Insufficient documentation

## 2015-10-04 MED ORDER — SODIUM IODIDE I 131 CAPSULE
14.7000 | Freq: Once | INTRAVENOUS | Status: AC | PRN
  Administered 2015-10-04: 14.7 via ORAL

## 2015-10-05 ENCOUNTER — Other Ambulatory Visit: Payer: Self-pay | Admitting: Family Medicine

## 2015-10-08 ENCOUNTER — Ambulatory Visit (INDEPENDENT_AMBULATORY_CARE_PROVIDER_SITE_OTHER): Payer: Medicare Other | Admitting: Cardiovascular Disease

## 2015-10-08 ENCOUNTER — Encounter: Payer: Self-pay | Admitting: Cardiovascular Disease

## 2015-10-08 ENCOUNTER — Telehealth: Payer: Self-pay

## 2015-10-08 VITALS — BP 126/81 | HR 127 | Ht 62.0 in | Wt 180.2 lb

## 2015-10-08 DIAGNOSIS — I480 Paroxysmal atrial fibrillation: Secondary | ICD-10-CM

## 2015-10-08 DIAGNOSIS — I10 Essential (primary) hypertension: Secondary | ICD-10-CM

## 2015-10-08 MED ORDER — DILTIAZEM HCL ER COATED BEADS 120 MG PO CP24: 120.0000 mg | ORAL_CAPSULE | Freq: Every day | ORAL | 5 refills | Status: DC

## 2015-10-08 MED ORDER — HYDROCHLOROTHIAZIDE 12.5 MG PO TABS: ORAL_TABLET | ORAL | 5 refills | Status: DC

## 2015-10-08 NOTE — Patient Instructions (Signed)
Medication Instructions:  DECREASE YOUR HYDROCHLOROTHIAZIDE TO 6.25 MG DAILY  START DILTIAZEM CD 120 MG DAILY   Labwork: NONE  Testing/Procedures: NONE  Follow-Up: Your physician recommends that you schedule a follow-up appointment in: [place]  Your physician recommends that you schedule a follow-up appointment in: [address] [place]   If you need a refill on your cardiac medications before your next appointment, please call your pharmacy.

## 2015-10-08 NOTE — Telephone Encounter (Signed)
Received call from pharmacist at [facility] calling to verify patient is to take both diltiazem 120 mg daily and metoprolol 100 mg daily.Advised pt is to take both.

## 2015-10-08 NOTE — Progress Notes (Signed)
Cardiology Office Note   Date:  10/08/2015   ID:  Martha Perkins, DOB [birth year], MRN [id number]  PCP:  Beatrice Lecher, MD  Cardiologist:   Martha Latch, MD   Chief Complaint  Patient presents with  . Follow-up    weakness, no other Sx.     History of Present Illness: Martha Perkins is a 70 y.o. female with paroxysmal atrial fibrillation, hyperthyroidism, hypertension, hyperlipidemia, and fatty liver disease who presents follow up on atrial fibrillation.  Martha Perkins was seen by her PCP, Martha Perkins, on 07/13/15. At the end of April she was diagnosed with atrial fibrillation. She was started on Eliquis and a low-dose beta blocker. Lab work revealed that she was hyperthyroid.  She had a thyroid uptake scan that revealed an enlarged, hyperfunctioning left lobe of the thyroid gland without discrete nodule. Findings were thought to be consistent with Graves' disease.  She had one thyroid lobe removed in her 90s due to a benign thyroid nodule.  Her sister had Graves disease and she thinks that a cousin died of thyroid storm at age 85.  At her last appointment 08/09/15 her heart rate was 134, so metoprolol was increased and lisinopril was discontinued.  She was also referred for a sleep study.  She has not yet had the study due to cost ($800).  She had an echo 08/2015 that revealed LVEF 50-55% with mild MR and TR.  Martha Perkins had the radioactive iodine therapy four days ago.  Prior to this her heart rate had been well-controlled.   Since then she has been feeling week and notes diaphoresis, especially at night.  She denies chest pain, shortness of breath, palpitations, lightheadedness or dizziness.  She has trouble sleeping at night.  Martha Perkins has mild, bilateral lower extremity edema that is chronic.  She denies orthopnea or PND.  Past Medical History:  Diagnosis Date  . Depression   . Hyperlipidemia   . Hypertension   . Hyperthyroidism 08/09/2015  . Kidney stone   . Paroxysmal  atrial fibrillation ([place]) 08/09/2015  . Thyroid disease     Past Surgical History:  Procedure Laterality Date  . CATARACT EXTRACTION Left 09/2012   Dr. Bunnie Philips  . LITHOTRIPSY    . REPLACEMENT TOTAL KNEE BILATERAL  2011  . THYROIDECTOMY  1971  . TUBAL LIGATION  1979     Current Outpatient Prescriptions  Medication Sig Dispense Refill  . albuterol (PROAIR HFA) 108 (90 BASE) MCG/ACT inhaler Inhale 2 puffs into the lungs every 6 (six) hours as needed for wheezing or shortness of breath. 18 each 3  . atorvastatin (LIPITOR) 40 MG tablet TAKE ONE TABLET BY MOUTH ONCE DAILY 90 tablet 0  . ELIQUIS 5 MG TABS tablet TAKE ONE TABLET BY MOUTH TWICE DAILY 60 tablet 5  . esomeprazole (NEXIUM) 40 MG capsule Take 1 capsule (40 mg total) by mouth daily. 30 capsule 6  . fluticasone (FLONASE) 50 MCG/ACT nasal spray USE ONE SPRAY(S) IN EACH NOSTRIL TWICE DAILY 48 g 1  . ketoconazole (NIZORAL) 2 % cream APPLY   TOPICALLY ONCE DAILY (Patient taking differently: APPLY   TOPICALLY as needed) 30 g 1  . methimazole (TAPAZOLE) 10 MG tablet Take 10 mg by mouth 2 (two) times daily.    . metoprolol succinate (TOPROL-XL) 100 MG 24 hr tablet Take 1 tablet (100 mg total) by mouth daily. 90 tablet 1  . traMADol (ULTRAM) 50 MG tablet Take 1 tablet (50 mg total) by mouth 3 (three)  times daily. 90 tablet 3  . diltiazem (CARDIZEM CD) 120 MG 24 hr capsule Take 1 capsule (120 mg total) by mouth daily. 30 capsule 5  . hydrochlorothiazide (HYDRODIURIL) 12.5 MG tablet 1/2 TABLET BY MOUTH DAILY 45 tablet 5   No current facility-administered medications for this visit.     Allergies:   Demerol; Meperidine hcl; Oxycodone-aspirin; and Percocet [oxycodone-acetaminophen]    Social History:  The patient  reports that she has never smoked. She does not have any smokeless tobacco history on file. She reports that she does not drink alcohol or use drugs.   Family History:  The patient's family history includes Coronary artery  disease (age of onset: 29) in her mother; Depression in her mother; Diabetes in her paternal grandmother; Hyperlipidemia in her mother; Thyroid disease in her sister.    ROS:  Please see the history of present illness.   Otherwise, review of systems are positive for none.   All other systems are reviewed and negative.    PHYSICAL EXAM: VS:  BP 126/81   Pulse (!) 127   Ht 5\' 2"  (1.575 m)   Wt 180 lb 3.2 oz (81.7 kg)   BMI 32.96 kg/m  , BMI Body mass index is 32.96 kg/m. GENERAL:  Well appearing HEENT:  Pupils equal round and reactive, fundi not visualized, oral mucosa unremarkable NECK:  No jugular venous distention, waveform within normal limits, carotid upstroke brisk and symmetric, no bruits, no thyromegaly LYMPHATICS:  No cervical adenopathy LUNGS:  Clear to auscultation bilaterally HEART:  Irregularly irregular. PMI not displaced or sustained,S1 and S2 within normal limits, no S3, no S4, no clicks, no rubs, I/VI systolic murmur at the LUSB.  ABD:  Flat, positive bowel sounds normal in frequency in pitch, no bruits, no rebound, no guarding, no midline pulsatile mass, no hepatomegaly, no splenomegaly EXT:  2 plus pulses throughout, no edema, no cyanosis no clubbing SKIN:  No rashes no nodules NEURO:  Cranial nerves II through XII grossly intact, motor grossly intact throughout PSYCH:  Cognitively intact, oriented to person place and time   EKG:  EKG is ordered today. The ekg ordered today demonstatrial fibrillation. Rate 17 bpm. PVC. Prior septal infarct.   Recent Labs: 07/02/2015: ALT 28; BUN 20; Creat 0.56; Hemoglobin 12.4; Platelets 180; Potassium 3.6; Sodium 141 09/12/2015: TSH 0.07    Lipid Panel    Component Value Date/Time   CHOL 124 (L) 07/02/2015 0959   TRIG 170 (H) 07/02/2015 0959   HDL 31 (L) 07/02/2015 0959   CHOLHDL 4.0 07/02/2015 0959   VLDL 34 (H) 07/02/2015 0959   LDLCALC 59 07/02/2015 0959      Wt Readings from Last 3 Encounters:  10/08/15 180 lb 3.2 oz  (81.7 kg)  09/26/15 185 lb (83.9 kg)  09/12/15 183 lb (83 kg)      ASSESSMENT AND PLAN:  # Atrial fibrillation: Martha Perkins has atrial fibrillation that is likely attributable to her hyperthyroidism. Her heart rate was well-controlled but has been worse in the last several days since getting the iodine ablation.  We will continue metoprolol 100 mg daily.  Start diltiazem 120 mg daily.  She will follow up in atrial fibrillation clinic in 2 weeks for follow up on her heart rate.  Sleep study is pending.  Continue Eliquis for anticoagulation. This patients CHA2DS2-VASc Score and unadjusted Ischemic Stroke Rate (% per year) is equal to 3.2 % stroke rate/year from a score of 3  Above score calculated as 1 point  each if present [CHF, HTN, DM, Vascular=MI/PAD/Aortic Plaque, Age if 48-74, or Female] Above score calculated as 2 points each if present [Age > 75, or Stroke/TIA/TE]  # Hypertension:  BP is well-controlled.  However, we are starting diltiazem, so we will reduce HCTZ to 6.25 mg daily.  Continue metoprolol as above.    Current medicines are reviewed at length with the patient today.  The patient does not have concerns regarding medicines.  The following changes have been made:  Increase metoprolol and stop lisinopril.   Labs/ tests ordered today include:   Orders Placed This Encounter  Procedures  . Ambulatory referral to Cardiology  . EKG 12-Lead     Disposition:   FU with Kenady Doxtater C. Oval Linsey, MD, [facility] in 2 months.  Afib clinic in 2 weeks.    This note was written with the assistance of speech recognition software.  Please excuse any transcriptional errors.  Signed, Teniyah Seivert C. Oval Linsey, MD, [facility]  10/08/2015 11:08 AM    [place]

## 2015-10-10 ENCOUNTER — Telehealth: Payer: Self-pay | Admitting: Cardiovascular Disease

## 2015-10-10 NOTE — Telephone Encounter (Signed)
Left message for pt, per kristin, pharm md, pt can only have grapefruit juice once a month at the most. She is to call with questions.

## 2015-10-10 NOTE — Telephone Encounter (Signed)
New message       Pt c/o medication issue:  1. Name of Medication: diltiazem 2. How are you currently taking this medication (dosage and times per day)? 120mg  daily 3. Are you having a reaction (difficulty breathing--STAT)? no 4. What is your medication issue? Pt will start medication tonight.  She want the doctor to know that she drinks 1/2 grapefruit juice and 1/2 flavored water often.  Reading the medication insert advised pts to tell their doctors if they drink grapefruit juice.  Please call and let her know if she can continue drinking this.

## 2015-10-18 ENCOUNTER — Encounter: Payer: Self-pay | Admitting: Family Medicine

## 2015-10-18 ENCOUNTER — Ambulatory Visit (INDEPENDENT_AMBULATORY_CARE_PROVIDER_SITE_OTHER): Payer: Medicare Other | Admitting: Family Medicine

## 2015-10-18 VITALS — BP 160/94 | HR 101 | Wt 182.0 lb

## 2015-10-18 DIAGNOSIS — R1084 Generalized abdominal pain: Secondary | ICD-10-CM

## 2015-10-18 DIAGNOSIS — R197 Diarrhea, unspecified: Secondary | ICD-10-CM

## 2015-10-18 DIAGNOSIS — A09 Infectious gastroenteritis and colitis, unspecified: Secondary | ICD-10-CM | POA: Diagnosis not present

## 2015-10-18 LAB — COMPLETE METABOLIC PANEL WITH GFR
ALBUMIN: 3.6 g/dL (ref 3.6–5.1)
ALK PHOS: 84 U/L (ref 33–130)
ALT: 19 U/L (ref 6–29)
AST: 17 U/L (ref 10–35)
BUN: 14 mg/dL (ref 7–25)
CALCIUM: 9.3 mg/dL (ref 8.6–10.4)
CO2: 24 mmol/L (ref 20–31)
Chloride: 109 mmol/L (ref 98–110)
Creat: 0.5 mg/dL (ref 0.50–0.99)
GFR, Est African American: >89 mL/min (ref >=60)
Glucose, Bld: 98 mg/dL (ref 65–99)
POTASSIUM: 4 mmol/L (ref 3.5–5.3)
Sodium: 141 mmol/L (ref 135–146)
Total Bilirubin: 0.9 mg/dL (ref 0.2–1.2)
Total Protein: 5.9 g/dL — ABNORMAL LOW (ref 6.1–8.1)

## 2015-10-18 LAB — CBC WITH DIFFERENTIAL/PLATELET
BASOS ABS: 0 {cells}/uL (ref 0–200)
Basophils Relative: 0 %
EOS PCT: 2 %
Eosinophils Absolute: 114 cells/uL (ref 15–500)
HEMATOCRIT: 38.7 % (ref 35.0–45.0)
HEMOGLOBIN: 13.1 g/dL (ref 11.7–15.5)
LYMPHS ABS: 1425 {cells}/uL (ref 850–3900)
Lymphocytes Relative: 25 %
MCH: 27.3 pg (ref 27.0–33.0)
MCHC: 33.9 g/dL (ref 32.0–36.0)
MCV: 80.8 fL (ref 80.0–100.0)
MONO ABS: 684 {cells}/uL (ref 200–950)
MPV: 11 fL (ref 7.5–12.5)
Monocytes Relative: 12 %
NEUTROS PCT: 61 %
Neutro Abs: 3477 cells/uL (ref 1500–7800)
Platelets: 237 10*3/uL (ref 140–400)
RBC: 4.79 MIL/uL (ref 3.80–5.10)
RDW: 15.1 % — ABNORMAL HIGH (ref 11.0–15.0)
WBC: 5.7 10*3/uL (ref 3.8–10.8)

## 2015-10-18 NOTE — Progress Notes (Signed)
Subjective:    CC: Diarrhea  HPI:  She comes in today complaining of upset stomach but going on for almost 2 weeks. She thinks it may have started after eating a hamburger at [employer]. She said today was the first day she had a slightly formed stool. She's been having some generalized abdominal pain. She even held some of her medications just to make sure that it wasn't a side effect. She's been extremely gassy and bloated. She denies any fevers chills or sweats but just feels extremely fatigued. She denies any blood in the stool. Her most recent antibiotics were back in April and May she had 2 rounds of Cipro.  Past medical history, Surgical history, Family history not pertinant except as noted below, Social history, Allergies, and medications have been entered into the medical record, reviewed, and corrections made.   Review of Systems: No fevers, chills, night sweats, weight loss, chest pain, or shortness of breath.   Objective:    General: Well Developed, well nourished, and in no acute distress.  Neuro: Alert and oriented x3, extra-ocular muscles intact, sensation grossly intact.  HEENT: Normocephalic, atraumatic  Skin: Warm and dry, no rashes. Cardiac: Irregular rate and rhythm, no murmurs rubs or gallops, no lower extremity edema.  Tachycardic.   Respiratory: Clear to auscultation bilaterally. Not using accessory muscles, speaking in full sentences. Abd: Soft, bloated with increased tympany diffusely. Tender diffusely. No rebound or guarding. No hepatomegaly.   Impression and Recommendations:   Diarrhea / Abd pain - pain is generalized with episodic of your diarrhea. She doesn't have a lot of frequency with it. We'll further evaluate with stool cultures to thyroid for Salmonella Shigella and will also going to test for C. difficile that her exposure to antibiotics was a little bit more remote. For now can use Imodium as needed to get the results back. We'll also check a CBC with  differential and CMP. Did encourage her to really increase her fluid/water intake.

## 2015-10-19 LAB — C. DIFFICILE GDH AND TOXIN A/B
C. DIFF TOXIN A/B: NOT DETECTED
C. DIFFICILE GDH: NOT DETECTED

## 2015-10-22 ENCOUNTER — Inpatient Hospital Stay (HOSPITAL_COMMUNITY): Admission: RE | Admit: 2015-10-22 | Payer: Self-pay | Source: Ambulatory Visit | Admitting: Nurse Practitioner

## 2015-10-22 LAB — STOOL CULTURE

## 2015-10-29 ENCOUNTER — Encounter (HOSPITAL_COMMUNITY): Payer: Self-pay | Admitting: Nurse Practitioner

## 2015-10-29 ENCOUNTER — Ambulatory Visit (HOSPITAL_COMMUNITY)
Admission: RE | Admit: 2015-10-29 | Discharge: 2015-10-29 | Disposition: A | Discharge status: Home / Self Care | Payer: Medicare Other | Source: Ambulatory Visit | Attending: Nurse Practitioner | Admitting: Nurse Practitioner

## 2015-10-29 VITALS — BP 110/74 | Ht 62.0 in | Wt 180.8 lb

## 2015-10-29 DIAGNOSIS — I10 Essential (primary) hypertension: Secondary | ICD-10-CM | POA: Insufficient documentation

## 2015-10-29 DIAGNOSIS — E785 Hyperlipidemia, unspecified: Secondary | ICD-10-CM | POA: Insufficient documentation

## 2015-10-29 DIAGNOSIS — I4891 Unspecified atrial fibrillation: Secondary | ICD-10-CM | POA: Diagnosis present

## 2015-10-29 DIAGNOSIS — Z7901 Long term (current) use of anticoagulants: Secondary | ICD-10-CM | POA: Insufficient documentation

## 2015-10-29 DIAGNOSIS — I481 Persistent atrial fibrillation: Secondary | ICD-10-CM | POA: Diagnosis not present

## 2015-10-29 DIAGNOSIS — E0590 Thyrotoxicosis, unspecified without thyrotoxic crisis or storm: Secondary | ICD-10-CM | POA: Insufficient documentation

## 2015-10-29 DIAGNOSIS — Z885 Allergy status to narcotic agent status: Secondary | ICD-10-CM | POA: Insufficient documentation

## 2015-10-29 DIAGNOSIS — Z8349 Family history of other endocrine, nutritional and metabolic diseases: Secondary | ICD-10-CM | POA: Insufficient documentation

## 2015-10-29 DIAGNOSIS — Z87442 Personal history of urinary calculi: Secondary | ICD-10-CM | POA: Diagnosis not present

## 2015-10-29 DIAGNOSIS — Z79899 Other long term (current) drug therapy: Secondary | ICD-10-CM | POA: Diagnosis not present

## 2015-10-29 DIAGNOSIS — Z8249 Family history of ischemic heart disease and other diseases of the circulatory system: Secondary | ICD-10-CM | POA: Diagnosis not present

## 2015-10-29 DIAGNOSIS — I4819 Other persistent atrial fibrillation: Secondary | ICD-10-CM

## 2015-10-29 NOTE — Progress Notes (Signed)
Primary Care Physician: Beatrice Lecher, MD Referring Physician: Dr. Oval Linsey Cardiologist: Dr. Oval Linsey Endocrinologist: Dr. Diego Cory Marck is a 70 y.o. female with a h/o hyperthyroidism and new onset afib that started in April of this year. She was on tapazole and was given RAI therapy 7/27. She was told that it would be several weeks before it was totally effective. She saw Dr. Oval Linsey 7/31 and had afib with RVR which had been controlled prior to RAI therapy. Rate control meds were increased and her v rate is better controlled today at 104 bpm. Anticoagulated with eliquis with a chadsvasc score of 3.  Today, she denies symptoms of palpitations, chest pain, shortness of breath, orthopnea, PND, lower extremity edema, dizziness, presyncope, syncope, or neurologic sequela. The patient is tolerating medications without difficulties and is otherwise without complaint today.   Past Medical History:  Diagnosis Date  . Depression   . Hyperlipidemia   . Hypertension   . Hyperthyroidism 08/09/2015  . Kidney stone   . Paroxysmal atrial fibrillation ([place]) 08/09/2015  . Thyroid disease    Past Surgical History:  Procedure Laterality Date  . CATARACT EXTRACTION Left 09/2012   Dr. Bunnie Philips  . LITHOTRIPSY    . REPLACEMENT TOTAL KNEE BILATERAL  2011  . THYROIDECTOMY  1971  . TUBAL LIGATION  1979    Current Outpatient Prescriptions  Medication Sig Dispense Refill  . albuterol (PROAIR HFA) 108 (90 BASE) MCG/ACT inhaler Inhale 2 puffs into the lungs every 6 (six) hours as needed for wheezing or shortness of breath. 18 each 3  . diltiazem (CARDIZEM CD) 120 MG 24 hr capsule Take 1 capsule (120 mg total) by mouth daily. 30 capsule 5  . ELIQUIS 5 MG TABS tablet TAKE ONE TABLET BY MOUTH TWICE DAILY 60 tablet 5  . esomeprazole (NEXIUM) 40 MG capsule Take 1 capsule (40 mg total) by mouth daily. 30 capsule 6  . fluticasone (FLONASE) 50 MCG/ACT nasal spray USE ONE SPRAY(S) IN EACH NOSTRIL  TWICE DAILY 48 g 1  . hydrochlorothiazide (HYDRODIURIL) 12.5 MG tablet Take 12.5 mg by mouth every other day.    . ketoconazole (NIZORAL) 2 % cream APPLY   TOPICALLY ONCE DAILY (Patient taking differently: APPLY   TOPICALLY as needed) 30 g 1  . methimazole (TAPAZOLE) 10 MG tablet Take 10 mg by mouth 2 (two) times daily.    . metoprolol succinate (TOPROL-XL) 100 MG 24 hr tablet Take 1 tablet (100 mg total) by mouth daily. 90 tablet 1  . traMADol (ULTRAM) 50 MG tablet Take 1 tablet (50 mg total) by mouth 3 (three) times daily. 90 tablet 3   No current facility-administered medications for this encounter.     Allergies  Allergen Reactions  . Demerol Nausea Only  . Meperidine Hcl   . Oxycodone-Aspirin   . Percocet [Oxycodone-Acetaminophen]     Social History   Social History  . Marital status: Married    Spouse name: N/A  . Number of children: N/A  . Years of education: N/A   Occupational History  . Retired Therapist, sports.     Social History Main Topics  . Smoking status: Never Smoker  . Smokeless tobacco: Not on file  . Alcohol use No  . Drug use: No  . Sexual activity: Not on file   Other Topics Concern  . Not on file   Social History Narrative  . No narrative on file    Family History  Problem Relation Age of Onset  .  Coronary artery disease Mother 51    deceased  . Depression Mother   . Diabetes Paternal Grandmother   . Hyperlipidemia Mother   . Thyroid disease Sister     ROS- All systems are reviewed and negative except as per the HPI above  Physical Exam: Vitals:   10/29/15 1007  BP: 110/74  Weight: 180 lb 12.8 oz (82 kg)  Height: 5\' 2"  (1.575 m)    GEN- The patient is well appearing, alert and oriented x 3 today.   Head- normocephalic, atraumatic Eyes-  Sclera clear, conjunctiva pink Ears- hearing intact Oropharynx- clear Neck- supple, no JVP Lymph- no cervical lymphadenopathy Lungs- Clear to ausculation bilaterally, normal work of breathing Heart-  Irregular rate and rhythm, no murmurs, rubs or gallops, PMI not laterally displaced GI- soft, NT, ND, + BS Extremities- no clubbing, cyanosis, or edema MS- no significant deformity or atrophy Skin- no rash or lesion Psych- euthymic mood, full affect Neuro- strength and sensation are intact  EKG- afib at 101 bpm, qrs int 74 ms, qtc 459 ms Epic records reviewed  Assessment and Plan:   1.Persisitent afib in the setting of hyperthyroidism Better rate controlled today,received RAI 7/27 Pt states endocrinologist says that it will be several weeks before full effect from therapy Scheduled for f/u with endocrinologist 8/31 Last TSH was 0.07 on 7/5 I think a cardioversion would be a reasonable thing to do when thyroid has returned to normal function If has ERAF then would entertain AAD therapy Continue diltiazem 120 mg qd and metoprolol succinate 100 mg daily Continue eliquis 5 mg bid  F/u with Dr. Oval Linsey 10/3 afib clinic as needed  Butch Penny C. Nalany Steedley, [facility] [address] [place], [place] [address] [phone]

## 2015-11-06 ENCOUNTER — Ambulatory Visit: Payer: Self-pay | Admitting: Family Medicine

## 2015-11-08 ENCOUNTER — Ambulatory Visit (INDEPENDENT_AMBULATORY_CARE_PROVIDER_SITE_OTHER): Payer: Medicare Other | Admitting: Endocrinology

## 2015-11-08 ENCOUNTER — Encounter: Payer: Self-pay | Admitting: Endocrinology

## 2015-11-08 VITALS — BP 126/70 | HR 78 | Ht 62.0 in | Wt 184.0 lb

## 2015-11-08 DIAGNOSIS — E0590 Thyrotoxicosis, unspecified without thyrotoxic crisis or storm: Secondary | ICD-10-CM | POA: Diagnosis not present

## 2015-11-08 LAB — T4, FREE: Free T4: 0.62 ng/dL (ref 0.60–1.60)

## 2015-11-08 LAB — TSH: TSH: 0.14 u[IU]/mL — ABNORMAL LOW (ref 0.35–4.50)

## 2015-11-08 MED ORDER — METHIMAZOLE 10 MG PO TABS: 10.0000 mg | ORAL_TABLET | Freq: Two times a day (BID) | ORAL | 3 refills | Status: DC

## 2015-11-08 NOTE — Patient Instructions (Addendum)
blood tests are requested for you today.  We'll let you know about the results. If ever you have fever while taking methimazole, stop it and call us, even if the reason is obvious, because of the risk of a rare side-effect. Please come back for a follow-up appointment in 1 month. 

## 2015-11-08 NOTE — Progress Notes (Signed)
Subjective:    Patient ID: Martha Perkins, female    DOB: [birth year], 70 y.o.   MRN: [id number]  HPI Pt returns for f/u of hyperthyroidism (dx'ed April, 2017, in w/u of AF; she had partial right thyroid lobectomy in 1970; she was rx'ed tapazole and toprol, due to the need for prompt improvement; she had RAI in July of 2017). She has intermittent palpitations and heat intolerance. She is still on tapazole, 10-BID.  Past Medical History:  Diagnosis Date  . Depression   . Hyperlipidemia   . Hypertension   . Hyperthyroidism 08/09/2015  . Kidney stone   . Paroxysmal atrial fibrillation ([place]) 08/09/2015  . Thyroid disease     Past Surgical History:  Procedure Laterality Date  . CATARACT EXTRACTION Left 09/2012   Dr. Bunnie Philips  . LITHOTRIPSY    . REPLACEMENT TOTAL KNEE BILATERAL  2011  . THYROIDECTOMY  1971  . TUBAL LIGATION  1979    Social History   Social History  . Marital status: Married    Spouse name: N/A  . Number of children: N/A  . Years of education: N/A   Occupational History  . Retired Therapist, sports.     Social History Main Topics  . Smoking status: Never Smoker  . Smokeless tobacco: Not on file  . Alcohol use No  . Drug use: No  . Sexual activity: Not on file   Other Topics Concern  . Not on file   Social History Narrative  . No narrative on file    Current Outpatient Prescriptions on File Prior to Visit  Medication Sig Dispense Refill  . albuterol (PROAIR HFA) 108 (90 BASE) MCG/ACT inhaler Inhale 2 puffs into the lungs every 6 (six) hours as needed for wheezing or shortness of breath. 18 each 3  . diltiazem (CARDIZEM CD) 120 MG 24 hr capsule Take 1 capsule (120 mg total) by mouth daily. 30 capsule 5  . ELIQUIS 5 MG TABS tablet TAKE ONE TABLET BY MOUTH TWICE DAILY 60 tablet 5  . esomeprazole (NEXIUM) 40 MG capsule Take 1 capsule (40 mg total) by mouth daily. 30 capsule 6  . fluticasone (FLONASE) 50 MCG/ACT nasal spray USE ONE SPRAY(S) IN EACH NOSTRIL TWICE DAILY 48  g 1  . hydrochlorothiazide (HYDRODIURIL) 12.5 MG tablet Take 12.5 mg by mouth every other day.    . ketoconazole (NIZORAL) 2 % cream APPLY   TOPICALLY ONCE DAILY (Patient taking differently: APPLY   TOPICALLY as needed) 30 g 1  . metoprolol succinate (TOPROL-XL) 100 MG 24 hr tablet Take 1 tablet (100 mg total) by mouth daily. 90 tablet 1  . traMADol (ULTRAM) 50 MG tablet Take 1 tablet (50 mg total) by mouth 3 (three) times daily. 90 tablet 3   No current facility-administered medications on file prior to visit.     Allergies  Allergen Reactions  . Demerol Nausea Only  . Meperidine Hcl   . Oxycodone-Aspirin   . Percocet [Oxycodone-Acetaminophen]     Family History  Problem Relation Age of Onset  . Coronary artery disease Mother 53    deceased  . Depression Mother   . Diabetes Paternal Grandmother   . Hyperlipidemia Mother   . Thyroid disease Sister     BP 126/70   Pulse 78   Ht 5\' 2"  (1.575 m)   Wt 184 lb (83.5 kg)   BMI 33.65 kg/m   Review of Systems No tremor.  Denies fever    Objective:   Physical Exam  VITAL SIGNS:  See vs page GENERAL: no distress NECK: a healed scar is present.  Large left thyroid mass is again noted.     Lab Results  Component Value Date   TSH 0.14 (L) 11/08/2015   T3TOTAL 319.0 (H) 07/02/2015      Assessment & Plan:  Hyperthyroidism: slightly improved.  Same tapazole

## 2015-11-13 ENCOUNTER — Encounter: Payer: Self-pay | Admitting: Family Medicine

## 2015-11-13 ENCOUNTER — Ambulatory Visit (INDEPENDENT_AMBULATORY_CARE_PROVIDER_SITE_OTHER): Payer: Medicare Other | Admitting: Family Medicine

## 2015-11-13 VITALS — BP 119/64

## 2015-11-13 DIAGNOSIS — Z23 Encounter for immunization: Secondary | ICD-10-CM | POA: Diagnosis not present

## 2015-11-13 DIAGNOSIS — L723 Sebaceous cyst: Secondary | ICD-10-CM | POA: Diagnosis not present

## 2015-11-13 NOTE — Progress Notes (Signed)
   Subjective:    Patient ID: Martha Perkins, female    DOB: 08/23/1945, 70 y.o.   MRN: [id number]  HPI Patient is here today to follow-up for 2 sebaceous cysts on her scalp. One is on the top of the head a little bit to the left and one is more on the right ports her ear. The one on the left has been draining and she has she accidentally hits it with her comb. She has had these previously. She would like full removal   Review of Systems     Objective:   Physical Exam  She has a large approximately 1/2 cm sebaceous cyst with a scab on the surface. She has a widely smaller 1.17 m sebaceous cyst on the right side of the head. That's not actively draining.      Assessment & Plan:    Sebaceous Cyst Excision Procedure Note  Pre-operative Diagnosis: seb cyst  Post-operative Diagnosis: same  Locations:2 lesion on scalp   Indications: irritation, hitting with comb, etc.   Anesthesia: Lidocaine 1% with epinephrine with added sodium bicarbonate  Procedure Details  Patient informed of the risks (including bleeding and infection) and benefits of the  procedure and Verbal informed consent obtained.  The lesion and surrounding area was given a sterile prep using chlorhexidine and draped in the usual sterile fashion. An incision was made over the cyst, which was dissected free of the surrounding tissue and removed.  The cyst was filled with typical sebaceous material.  The wound was closed with 4-0 Prolene using simple interrupted stitches.   The specimen was sent for pathologic examination. The patient tolerated the procedure well.  EBL: 1 ml  Findings:   Condition: Stable  Complications: none.  Plan: 1. Instructed to keep the wound dry and covered for 24-48h and clean thereafter. 2. Warning signs of infection were reviewed.   3. Recommended that the patient use OTC acetaminophen as needed for pain.  4. Return for suture removal in 7-10 days.

## 2015-11-13 NOTE — Patient Instructions (Addendum)
Follow up in 7-10 days for suture removal.   Keep dry for 48 hours.   Ok to shower and get wet after that.  Pat dry and apply small dab of vaseline.   Can use Ibuprofen or tylenol for pain relief and icing if needed.

## 2015-11-22 ENCOUNTER — Ambulatory Visit: Payer: Self-pay | Admitting: Family Medicine

## 2015-11-23 ENCOUNTER — Ambulatory Visit: Payer: Medicare Other | Admitting: Sports Medicine

## 2015-11-23 VITALS — BP 134/73 | HR 107 | Wt 189.0 lb

## 2015-11-23 DIAGNOSIS — L723 Sebaceous cyst: Secondary | ICD-10-CM

## 2015-11-23 NOTE — Progress Notes (Signed)
Patient presents for suture removal. The wound is well healed without signs of infection.  The sutures are removed. Wound care and activity instructions given. Return prn.  The suture from the left upper scalp was removed. The suture was removed some of the scab pulled away from the scalp. There was a slight opening in the scalp where the suture was. I looked completley fine with no pus, or any discharge no fowl smell.I did explain to the patient that as the scalp continues to heal,as it does from the inside out the small opening will fill in. The patient, who is a retired Marine scientist, voiced understanding.  The patient also wanted to me to look at a red spot on her right inner thigh. It looked like a very small bruise with an even smaller scab in the middle. I asked patient if it hurt or itched and she denied this. She only recently noticed it and it has not changed in size or shape. It looked almost like an excoriated foliculitis lesion . I did tell the patient to continue to watch it over the weekend and if it didn't go away or it new symptoms developed, such as fever, pain or discomfort at the sight,bleeding or itching to go have it checked at urgent care. F/u with PCP as needed.

## 2015-12-07 ENCOUNTER — Ambulatory Visit (INDEPENDENT_AMBULATORY_CARE_PROVIDER_SITE_OTHER): Payer: Medicare Other | Admitting: Endocrinology

## 2015-12-07 ENCOUNTER — Encounter: Payer: Self-pay | Admitting: Endocrinology

## 2015-12-07 VITALS — BP 132/70 | HR 82 | Ht 62.0 in | Wt 189.0 lb

## 2015-12-07 DIAGNOSIS — E0590 Thyrotoxicosis, unspecified without thyrotoxic crisis or storm: Secondary | ICD-10-CM

## 2015-12-07 LAB — T4, FREE: Free T4: 0.29 ng/dL — ABNORMAL LOW (ref 0.60–1.60)

## 2015-12-07 LAB — TSH: TSH: 46.64 u[IU]/mL — AB (ref 0.35–4.50)

## 2015-12-07 NOTE — Patient Instructions (Signed)
blood tests are requested for you today.  We'll let you know about the results. If ever you have fever while taking methimazole, stop it and call us, even if the reason is obvious, because of the risk of a rare side-effect. Please come back for a follow-up appointment in 1 month. 

## 2015-12-07 NOTE — Progress Notes (Signed)
Subjective:    Patient ID: Martha Perkins, female    DOB: 1945-04-29, 70 y.o.   MRN: [id number]  HPI Pt returns for f/u of hyperthyroidism (dx'ed April, 2017, in w/u of AF; she had partial right thyroid lobectomy in 1970; she was rx'ed tapazole and toprol, due to the need for prompt improvement; she had RAI in July of 2017; due to AF, she resumed tapazole after RAI).  pt states she feels better in general.  In particular, palpitations are less.   Past Medical History:  Diagnosis Date  . Depression   . Hyperlipidemia   . Hypertension   . Hyperthyroidism 08/09/2015  . Kidney stone   . Paroxysmal atrial fibrillation ([place]) 08/09/2015  . Thyroid disease     Past Surgical History:  Procedure Laterality Date  . CATARACT EXTRACTION Left 09/2012   Dr. Bunnie Philips  . LITHOTRIPSY    . REPLACEMENT TOTAL KNEE BILATERAL  2011  . THYROIDECTOMY  1971  . TUBAL LIGATION  1979    Social History   Social History  . Marital status: Married    Spouse name: N/A  . Number of children: N/A  . Years of education: N/A   Occupational History  . Retired Therapist, sports.     Social History Main Topics  . Smoking status: Never Smoker  . Smokeless tobacco: Not on file  . Alcohol use No  . Drug use: No  . Sexual activity: Not on file   Other Topics Concern  . Not on file   Social History Narrative  . No narrative on file    Current Outpatient Prescriptions on File Prior to Visit  Medication Sig Dispense Refill  . albuterol (PROAIR HFA) 108 (90 BASE) MCG/ACT inhaler Inhale 2 puffs into the lungs every 6 (six) hours as needed for wheezing or shortness of breath. 18 each 3  . diltiazem (CARDIZEM CD) 120 MG 24 hr capsule Take 1 capsule (120 mg total) by mouth daily. 30 capsule 5  . ELIQUIS 5 MG TABS tablet TAKE ONE TABLET BY MOUTH TWICE DAILY 60 tablet 5  . esomeprazole (NEXIUM) 40 MG capsule Take 1 capsule (40 mg total) by mouth daily. 30 capsule 6  . fluticasone (FLONASE) 50 MCG/ACT nasal spray USE ONE  SPRAY(S) IN EACH NOSTRIL TWICE DAILY 48 g 1  . hydrochlorothiazide (HYDRODIURIL) 12.5 MG tablet Take 12.5 mg by mouth every other day.    . ketoconazole (NIZORAL) 2 % cream APPLY   TOPICALLY ONCE DAILY (Patient taking differently: APPLY   TOPICALLY as needed) 30 g 1  . metoprolol succinate (TOPROL-XL) 100 MG 24 hr tablet Take 1 tablet (100 mg total) by mouth daily. 90 tablet 1  . traMADol (ULTRAM) 50 MG tablet Take 1 tablet (50 mg total) by mouth 3 (three) times daily. 90 tablet 3   No current facility-administered medications on file prior to visit.     Allergies  Allergen Reactions  . Demerol Nausea Only  . Meperidine Hcl   . Oxycodone-Aspirin   . Percocet [Oxycodone-Acetaminophen]     Family History  Problem Relation Age of Onset  . Coronary artery disease Mother 28    deceased  . Depression Mother   . Diabetes Paternal Grandmother   . Hyperlipidemia Mother   . Thyroid disease Sister     BP 132/70   Pulse 82   Ht 5\' 2"  (1.575 m)   Wt 189 lb (85.7 kg)   SpO2 96%   BMI 34.57 kg/m    Review  of Systems denies fever    Objective:   Physical Exam VITAL SIGNS:  See vs page GENERAL: no distress NECK: a healed scar is present.  left thyroid mass is again noted, but is much smaller today.     Lab Results  Component Value Date   TSH 46.64 (H) 12/07/2015   T3TOTAL 319.0 (H) 07/02/2015      Assessment & Plan:  Post-RAI hypothyroidism: new.  D/c tapazole, and I have sent a prescription to your pharmacy for synthroid

## 2015-12-08 ENCOUNTER — Telehealth: Payer: Self-pay | Admitting: Endocrinology

## 2015-12-08 MED ORDER — LEVOTHYROXINE SODIUM 100 MCG PO TABS: 100.0000 ug | ORAL_TABLET | Freq: Every day | ORAL | 2 refills | Status: DC

## 2015-12-08 NOTE — Telephone Encounter (Signed)
This is an addendum to the result note: I have sent a prescription to your pharmacy, for a thyroid hormone pill, because I think the low thyroid is from both the methimazole and the radioactive iodine.  I'll see you next time.

## 2015-12-10 ENCOUNTER — Ambulatory Visit (INDEPENDENT_AMBULATORY_CARE_PROVIDER_SITE_OTHER): Payer: Medicare Other | Admitting: Family Medicine

## 2015-12-10 ENCOUNTER — Encounter: Payer: Self-pay | Admitting: Family Medicine

## 2015-12-10 VITALS — BP 118/69 | HR 79 | Wt 189.0 lb

## 2015-12-10 DIAGNOSIS — M542 Cervicalgia: Secondary | ICD-10-CM

## 2015-12-10 DIAGNOSIS — R519 Headache, unspecified: Secondary | ICD-10-CM

## 2015-12-10 DIAGNOSIS — R51 Headache: Secondary | ICD-10-CM

## 2015-12-10 LAB — CBC WITH DIFFERENTIAL/PLATELET
BASOS ABS: 66 {cells}/uL (ref 0–200)
Basophils Relative: 1 %
EOS ABS: 132 {cells}/uL (ref 15–500)
EOS PCT: 2 %
HCT: 45.2 % — ABNORMAL HIGH (ref 35.0–45.0)
Hemoglobin: 15.2 g/dL (ref 11.7–15.5)
LYMPHS PCT: 37 %
Lymphs Abs: 2442 cells/uL (ref 850–3900)
MCH: 29.6 pg (ref 27.0–33.0)
MCHC: 33.6 g/dL (ref 32.0–36.0)
MCV: 88.1 fL (ref 80.0–100.0)
MONOS PCT: 8 %
MPV: 10.7 fL (ref 7.5–12.5)
Monocytes Absolute: 528 cells/uL (ref 200–950)
NEUTROS ABS: 3432 {cells}/uL (ref 1500–7800)
NEUTROS PCT: 52 %
PLATELETS: 216 10*3/uL (ref 140–400)
RBC: 5.13 MIL/uL — ABNORMAL HIGH (ref 3.80–5.10)
RDW: 16.1 % — AB (ref 11.0–15.0)
WBC: 6.6 10*3/uL (ref 3.8–10.8)

## 2015-12-10 NOTE — Telephone Encounter (Signed)
I contacted the patient and advised of message. Patient voiced understanding and had no further questions.  

## 2015-12-10 NOTE — Progress Notes (Signed)
Subjective:    Patient ID: Martha Perkins, female    DOB: 01-12-1946, 70 y.o.   MRN: [id number]  HPI 70 Yo female with of HTN, allergic rhinitis, Hyperthyroidism c/o of persistent HA that started about 10 days ago.  She is not sure what is triggering it.  Says always starts at the right skull base.  She had a thyroid level checked 3 days ago which showed her TSH was 46. She was encouraged to stop her methimazole and start 100 g daily of levothyroxine.  Says tylenol and tramadol does  Help.  Lying down can help as well. Pressing on it helps as well.  She denies any fevers chills or sweats but says she just doesn't feel well overall. She feels tired and she's been having a lot of joint pain but she also has not been able to take her typical daily NSAID for her arthritis as well.    Review of Systems  BP 118/69   Pulse 79   Wt 189 lb (85.7 kg)   SpO2 99%   BMI 34.57 kg/m     Allergies  Allergen Reactions  . Demerol Nausea Only  . Meperidine Hcl   . Oxycodone-Aspirin   . Percocet [Oxycodone-Acetaminophen]     Past Medical History:  Diagnosis Date  . Depression   . Hyperlipidemia   . Hypertension   . Hyperthyroidism 08/09/2015  . Kidney stone   . Paroxysmal atrial fibrillation ([place]) 08/09/2015  . Thyroid disease     Past Surgical History:  Procedure Laterality Date  . CATARACT EXTRACTION Left 09/2012   Dr. Bunnie Philips  . LITHOTRIPSY    . REPLACEMENT TOTAL KNEE BILATERAL  2011  . THYROIDECTOMY  1971  . TUBAL LIGATION  1979    Social History   Social History  . Marital status: Married    Spouse name: N/A  . Number of children: N/A  . Years of education: N/A   Occupational History  . Retired Therapist, sports.     Social History Main Topics  . Smoking status: Never Smoker  . Smokeless tobacco: Not on file  . Alcohol use No  . Drug use: No  . Sexual activity: Not on file   Other Topics Concern  . Not on file   Social History Narrative  . No narrative on file    Family  History  Problem Relation Age of Onset  . Coronary artery disease Mother 40    deceased  . Depression Mother   . Diabetes Paternal Grandmother   . Hyperlipidemia Mother   . Thyroid disease Sister     Outpatient Encounter Prescriptions as of 12/10/2015  Medication Sig  . albuterol (PROAIR HFA) 108 (90 BASE) MCG/ACT inhaler Inhale 2 puffs into the lungs every 6 (six) hours as needed for wheezing or shortness of breath.  Marland Kitchen atorvastatin (LIPITOR) 40 MG tablet Take 40 mg by mouth daily.  Marland Kitchen diltiazem (CARDIZEM CD) 120 MG 24 hr capsule Take 1 capsule (120 mg total) by mouth daily.  Marland Kitchen ELIQUIS 5 MG TABS tablet TAKE ONE TABLET BY MOUTH TWICE DAILY  . esomeprazole (NEXIUM) 40 MG capsule Take 1 capsule (40 mg total) by mouth daily.  . fluticasone (FLONASE) 50 MCG/ACT nasal spray USE ONE SPRAY(S) IN EACH NOSTRIL TWICE DAILY  . hydrochlorothiazide (HYDRODIURIL) 12.5 MG tablet Take 12.5 mg by mouth every other day.  . ketoconazole (NIZORAL) 2 % cream APPLY   TOPICALLY ONCE DAILY (Patient taking differently: APPLY   TOPICALLY as needed)  .  levothyroxine (SYNTHROID, LEVOTHROID) 100 MCG tablet Take 1 tablet (100 mcg total) by mouth daily before breakfast.  . metoprolol succinate (TOPROL-XL) 100 MG 24 hr tablet Take 1 tablet (100 mg total) by mouth daily.  . traMADol (ULTRAM) 50 MG tablet Take 1 tablet (50 mg total) by mouth 3 (three) times daily.   No facility-administered encounter medications on file as of 12/10/2015.          Objective:   Physical Exam  Constitutional: She is oriented to person, place, and time. She appears well-developed and well-nourished.  HENT:  Head: Normocephalic and atraumatic.  Right Ear: External ear normal.  Left Ear: External ear normal.  Eyes: Conjunctivae are normal. Pupils are equal, round, and reactive to light.  Musculoskeletal:  Normal cervical flexion, extention. Decreased rotation right and left. Some pain over the right paraspinous muscles at base of skill  on the right side.    Neurological: She is alert and oriented to person, place, and time.  Skin: Skin is warm and dry.  Psychiatric: She has a normal mood and affect. Her behavior is normal.       Assessment & Plan:  Headache-I really think these are coming from the upper cervical spine. Probably some facet inflammation dysfunction. She really can't take anti-inflammatory his right now because she is on blood thinners. That she will hopefully have her ablation seen. Okay to continue to use tramadol and Tylenol as needed. Recommend referral for physical therapy to start working on the cervical spine. I think this will actually provide her the most relief. Also encouraged her to use her heating pad on at home. I think is unlikely to be related to infection but patient is concerned we will get a CBC today.

## 2015-12-10 NOTE — Progress Notes (Signed)
Cardiology Office Note   Date:  12/11/2015   ID:  Martha Perkins, DOB 08-27-1945, MRN [id number]  PCP:  Beatrice Lecher, MD  Cardiologist:   Skeet Latch, MD   Chief Complaint  Patient presents with  . Follow-up    2 months; edema; in ankles occasionally.      History of Present Illness: Martha Perkins is a 71 y.o. female with paroxysmal atrial fibrillation, hyperthyroidism, hypertension, hyperlipidemia, and fatty liver disease who presents follow up on atrial fibrillation.  Martha Perkins was seen by her PCP, [name], on 07/13/15 for newly-diagnosed atrial fibrillation.  She was started on Eliquis and a low-dose beta blocker. Lab work revealed that she was hyperthyroid.  She had a thyroid uptake scan that revealed an enlarged, hyperfunctioning left lobe of the thyroid gland without discrete nodule. Findings were thought to be consistent with Graves' disease. She also underwent radioactive iodine therapy. She was seen in clinic 08/09/15 and metoprolol was increased.  Lisinopril was discontinued. She had an echo 08/2015 that revealed LVEF 50-55% with mild MR and TR.  Martha Perkins saw Roderic Palau, NP in the atrial fibrillation clinic on 10/29/15.  At that time her ventricular rate was better controlled.  There was a plan to pursue DCCV once her thyroid is better controlled.  She saw Dr. Loanne Drilling on 8/31 and her hyperthyroidism was felt to be slightly improved.  TSH was 46.64 and free T4 was 0.29 on 12/07/15.  She has been feeling much better.  She continues to notice some atrial fibrillation at night when she goes to bed.  It is much less bothersome than it was in the past.  She denies chest pain or shortness of breath. She has been splitting her hydrochlorothiazide pill in to half. However the pill is very small. She only takes it as needed. She does note some mild lower extremity edema that typically improves with elevation of her legs. She has not noted any orthopnea or PND.  Martha Perkins  wonders if she will need to be on blood thinners for the rest of her life. She also wonders if she is eligible to undergo cardioversion at this time.   Past Medical History:  Diagnosis Date  . Depression   . Hyperlipidemia   . Hypertension   . Hyperthyroidism 08/09/2015  . Kidney stone   . Paroxysmal atrial fibrillation ([place]) 08/09/2015  . Thyroid disease     Past Surgical History:  Procedure Laterality Date  . CATARACT EXTRACTION Left 09/2012   Dr. Bunnie Philips  . LITHOTRIPSY    . REPLACEMENT TOTAL KNEE BILATERAL  2011  . THYROIDECTOMY  1971  . TUBAL LIGATION  1979     Current Outpatient Prescriptions  Medication Sig Dispense Refill  . albuterol (PROAIR HFA) 108 (90 BASE) MCG/ACT inhaler Inhale 2 puffs into the lungs every 6 (six) hours as needed for wheezing or shortness of breath. 18 each 3  . atorvastatin (LIPITOR) 40 MG tablet Take 40 mg by mouth daily.    Marland Kitchen diltiazem (CARDIZEM CD) 120 MG 24 hr capsule Take 1 capsule (120 mg total) by mouth daily. 30 capsule 5  . ELIQUIS 5 MG TABS tablet TAKE ONE TABLET BY MOUTH TWICE DAILY 60 tablet 5  . esomeprazole (NEXIUM) 40 MG capsule Take 1 capsule (40 mg total) by mouth daily. 30 capsule 6  . fluticasone (FLONASE) 50 MCG/ACT nasal spray USE ONE SPRAY(S) IN EACH NOSTRIL TWICE DAILY 48 g 1  . hydrochlorothiazide (HYDRODIURIL) 12.5 MG tablet  Take 12.5 mg by mouth as needed.     Marland Kitchen ketoconazole (NIZORAL) 2 % cream APPLY   TOPICALLY ONCE DAILY (Patient taking differently: APPLY   TOPICALLY as needed) 30 g 1  . levothyroxine (SYNTHROID, LEVOTHROID) 100 MCG tablet Take 1 tablet (100 mcg total) by mouth daily before breakfast. 30 tablet 2  . metoprolol succinate (TOPROL-XL) 100 MG 24 hr tablet Take 1 tablet (100 mg total) by mouth daily. 90 tablet 1  . traMADol (ULTRAM) 50 MG tablet Take 1 tablet (50 mg total) by mouth 3 (three) times daily. 90 tablet 3   No current facility-administered medications for this visit.     Allergies:   Demerol;  Meperidine hcl; Oxycodone-aspirin; and Percocet [oxycodone-acetaminophen]    Social History:  The patient  reports that she has never smoked. She does not have any smokeless tobacco history on file. She reports that she does not drink alcohol or use drugs.   Family History:  The patient's family history includes Coronary artery disease (age of onset: 74) in her mother; Depression in her mother; Diabetes in her paternal grandmother; Hyperlipidemia in her mother; Thyroid disease in her sister.    ROS:  Please see the history of present illness.   Otherwise, review of systems are positive for none.   All other systems are reviewed and negative.    PHYSICAL EXAM: VS:  BP 132/79   Pulse 64   Ht 5\' 2"  (1.575 m)   Wt 190 lb 3.2 oz (86.3 kg)   BMI 34.79 kg/m  , BMI Body mass index is 34.79 kg/m. GENERAL:  Well appearing HEENT:  Pupils equal round and reactive, fundi not visualized, oral mucosa unremarkable NECK:  No jugular venous distention, waveform within normal limits, carotid upstroke brisk and symmetric, no bruits LYMPHATICS:  No cervical adenopathy LUNGS:  Clear to auscultation bilaterally HEART:  Irregularly irregular. PMI not displaced or sustained,S1 and S2 within normal limits, no S3, no S4, no clicks, no rubs, I/VI systolic murmur at the LUSB.  ABD:  Flat, positive bowel sounds normal in frequency in pitch, no bruits, no rebound, no guarding, no midline pulsatile mass, no hepatomegaly, no splenomegaly EXT:  2 plus pulses throughout, no edema, no cyanosis no clubbing SKIN:  No rashes no nodules NEURO:  Cranial nerves II through XII grossly intact, motor grossly intact throughout PSYCH:  Cognitively intact, oriented to person place and time   EKG:  EKG is ordered today. The ekg ordered 12/11/15 demonstatrial fibrillation. Rate 64 bpm. PVC. Prior septal infarct.  Right axis deviation.  Recent Labs: 10/18/2015: ALT 19; BUN 14; Creat 0.50; Potassium 4.0; Sodium 141 12/07/2015: TSH  46.64 12/10/2015: Hemoglobin 15.2; Platelets 216    Lipid Panel    Component Value Date/Time   CHOL 124 (L) 07/02/2015 0959   TRIG 170 (H) 07/02/2015 0959   HDL 31 (L) 07/02/2015 0959   CHOLHDL 4.0 07/02/2015 0959   VLDL 34 (H) 07/02/2015 0959   LDLCALC 59 07/02/2015 0959      Wt Readings from Last 3 Encounters:  12/11/15 190 lb 3.2 oz (86.3 kg)  12/10/15 189 lb (85.7 kg)  12/07/15 189 lb (85.7 kg)      ASSESSMENT AND PLAN:  # Atrial fibrillation: Martha Perkins has atrial fibrillation that is likely attributable to her hyperthyroidism.  She was recently started on Synthroid for hypothyroidism after radioactive iodine. Her heart rate is much better-controlled and she is having minimal symptoms of atrial fibrillation. We will plan to pursue DC  cardioversion once she is euthyroid. She sees Dr. Loanne Drilling each month and will call us when her levels are normal.  Continue metoprolol 100 mg daily and diltiazem 120 mg daily.  Continue Eliquis for anticoagulation. If she maintains sinus rhythm after cardioversion she may not need lifelong anticoagulation.  This patients CHA2DS2-VASc Score and unadjusted Ischemic Stroke Rate (% per year) is equal to 3.2 % stroke rate/year from a score of 3  Above score calculated as 1 point each if present [CHF, HTN, DM, Vascular=MI/PAD/Aortic Plaque, Age if 65-74, or Female] Above score calculated as 2 points each if present [Age > 75, or Stroke/TIA/TE]  # Hypertension:  BP is well-controlled. She hasn't been taking HCTZ regularly because the pills are difficult to split. However, she does use it occasionally for edema. We will switch this to daily as needed.    Current medicines are reviewed at length with the patient today.  The patient does not have concerns regarding medicines.  The following changes have been made: Change HCTZ to prn.  Labs/ tests ordered today include:   Orders Placed This Encounter  Procedures  . EKG 12-Lead     Disposition:   FU  with Delrick Dehart C. Oval Linsey, MD, [facility] in 3 months.   This note was written with the assistance of speech recognition software.  Please excuse any transcriptional errors.  Signed, Ajai Terhaar C. Oval Linsey, [place], [facility]  12/11/2015 8:17 AM    [place]

## 2015-12-11 ENCOUNTER — Encounter: Payer: Self-pay | Admitting: Cardiovascular Disease

## 2015-12-11 ENCOUNTER — Ambulatory Visit (INDEPENDENT_AMBULATORY_CARE_PROVIDER_SITE_OTHER): Payer: Medicare Other | Admitting: Cardiovascular Disease

## 2015-12-11 VITALS — BP 132/79 | HR 64 | Ht 62.0 in | Wt 190.2 lb

## 2015-12-11 DIAGNOSIS — I480 Paroxysmal atrial fibrillation: Secondary | ICD-10-CM

## 2015-12-11 DIAGNOSIS — I10 Essential (primary) hypertension: Secondary | ICD-10-CM

## 2015-12-11 NOTE — Patient Instructions (Signed)
Medication Instructions:  CHANGE YOUR HYDROCHLOROTHIAZIDE TO AS NEEDED FOR SWELLING  Labwork: NONE  Testing/Procedures: NONE  Follow-Up: Your physician wants you to follow-up in: 3 MONTH OV You will receive a reminder letter in the mail two months in advance. If you don't receive a letter, please call our office to schedule the follow-up appointment.  If you need a refill on your cardiac medications before your next appointment, please call your pharmacy.

## 2015-12-12 ENCOUNTER — Other Ambulatory Visit: Payer: Self-pay | Admitting: *Deleted

## 2015-12-12 MED ORDER — ESOMEPRAZOLE MAGNESIUM 40 MG PO CPDR: 40.0000 mg | DELAYED_RELEASE_CAPSULE | Freq: Every day | ORAL | 0 refills | Status: DC

## 2015-12-12 MED ORDER — ATORVASTATIN CALCIUM 40 MG PO TABS: 40.0000 mg | ORAL_TABLET | Freq: Every day | ORAL | 0 refills | Status: DC

## 2015-12-12 MED ORDER — ALBUTEROL SULFATE HFA 108 (90 BASE) MCG/ACT IN AERS: 2.0000 | INHALATION_SPRAY | Freq: Four times a day (QID) | RESPIRATORY_TRACT | 3 refills | Status: DC | PRN

## 2015-12-12 MED ORDER — TRAMADOL HCL 50 MG PO TABS: 50.0000 mg | ORAL_TABLET | Freq: Three times a day (TID) | ORAL | 0 refills | Status: DC

## 2015-12-12 MED ORDER — FLUTICASONE PROPIONATE 50 MCG/ACT NA SUSP: 1.0000 | Freq: Two times a day (BID) | NASAL | 1 refills | Status: DC

## 2015-12-14 ENCOUNTER — Other Ambulatory Visit: Payer: Self-pay

## 2015-12-14 ENCOUNTER — Ambulatory Visit (INDEPENDENT_AMBULATORY_CARE_PROVIDER_SITE_OTHER): Payer: Medicare Other | Admitting: Rehabilitative and Restorative Service Providers"

## 2015-12-14 ENCOUNTER — Encounter: Payer: Self-pay | Admitting: Rehabilitative and Restorative Service Providers"

## 2015-12-14 DIAGNOSIS — R29898 Other symptoms and signs involving the musculoskeletal system: Secondary | ICD-10-CM

## 2015-12-14 DIAGNOSIS — G4452 New daily persistent headache (NDPH): Secondary | ICD-10-CM | POA: Diagnosis not present

## 2015-12-14 DIAGNOSIS — M542 Cervicalgia: Secondary | ICD-10-CM

## 2015-12-14 DIAGNOSIS — R293 Abnormal posture: Secondary | ICD-10-CM

## 2015-12-14 MED ORDER — LEVOTHYROXINE SODIUM 100 MCG PO TABS: 100.0000 ug | ORAL_TABLET | Freq: Every day | ORAL | 2 refills | Status: DC

## 2015-12-14 MED ORDER — ESOMEPRAZOLE MAGNESIUM 40 MG PO CPDR: 20.0000 mg | DELAYED_RELEASE_CAPSULE | Freq: Every day | ORAL | 0 refills | Status: DC

## 2015-12-14 MED ORDER — TRAMADOL HCL 50 MG PO TABS: 50.0000 mg | ORAL_TABLET | Freq: Three times a day (TID) | ORAL | 0 refills | Status: DC

## 2015-12-14 MED ORDER — ATORVASTATIN CALCIUM 40 MG PO TABS: 40.0000 mg | ORAL_TABLET | Freq: Every day | ORAL | 0 refills | Status: DC

## 2015-12-14 NOTE — Therapy (Signed)
[place], [place], [address] Phone: [phone]   Fax:  [phone]  Physical Therapy Evaluation  Patient Details  Name: Martha Perkins MRN: [id number] Date of Birth: September 08, 1945 Referring Provider: Dr Beatrice Lecher  Encounter Date: 12/14/2015      PT End of Session - 12/14/15 1103    Visit Number 1   Number of Visits 12   Date for PT Re-Evaluation 01/25/16   PT Start Time [id number]   PT Stop Time 1114   PT Time Calculation (min) 59 min   Activity Tolerance Patient tolerated treatment well      Past Medical History:  Diagnosis Date  . Depression   . Hyperlipidemia   . Hypertension   . Hyperthyroidism 08/09/2015  . Kidney stone   . Paroxysmal atrial fibrillation ([place]) 08/09/2015  . Thyroid disease     Past Surgical History:  Procedure Laterality Date  . CATARACT EXTRACTION Left 09/2012   Dr. Bunnie Philips  . LITHOTRIPSY    . REPLACEMENT TOTAL KNEE BILATERAL  2011  . THYROIDECTOMY  1971  . TUBAL LIGATION  1979    There were no vitals filed for this visit.       Subjective Assessment - 12/14/15 1020    Subjective Martha Perkins reports that she started having a headacke about two weeks ago mostly on the right side of her head starting in the back of her head and radiating into the top of her head. She has continued to have the same type of headache daily. Headache may last from a few minutes if she can take pain medications and lie down but may last several hours if she is unable to treat the headache. She sometimes awakens with headache.    Pertinent History arthritis; especially in both shoudlers (has had PT in the past for shoulder pain - MD recommends shoulder surgery but pt has not done this. bilat TKA - 2011. treatment for thyroid problems - caused a-fib(now on blood thinner)    How long can you sit comfortably? 20-30 min    How long can you stand comfortably? 10 min    How long can you walk  comfortably? 10-15 min    Patient Stated Goals get rid of headache and neck pain    Currently in Pain? No/denies   Pain Score --  can be up to 6-10/10    Pain Location Head   Pain Orientation Right   Pain Descriptors / Indicators Burning;Pounding   Pain Type Acute pain   Pain Onset 1 to 4 weeks ago   Pain Frequency Intermittent   Pain Relieving Factors meds; pressing on the back of head/neck; lying down             [facility] PT Assessment - 12/14/15 0001      Assessment   Medical Diagnosis Persistent headache; cervical pain    Referring Provider Dr Beatrice Lecher   Onset Date/Surgical Date 11/28/15   Hand Dominance Right   Next MD Visit 11/17   Prior Therapy PT ~ 2 or 3 years ago for shoulder pain      Precautions   Precautions None     Balance Screen   Has the patient fallen in the past 6 months No   Has the patient had a decrease in activity level because of a fear of falling?  No   Is the patient reluctant to leave their home because of a fear of falling?  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 [place] household chores; choir; church     Observation/Other Assessments   Focus on Therapeutic Outcomes (FOTO)  34% limitation      Sensation   Additional Comments WFL's per pt report      Posture/Postural Control   Posture Comments significant head forward; shoulders rounded and elevated; increased thoracic kyphosis; scapulae abducted and rounded along the thoracic spine      AROM   Right/Left Shoulder --  limited bilat shd ROM in all planes ~ 50-70% of range    Cervical Flexion 56   Cervical Extension 37   Cervical - Right Side Bend 27   Cervical - Left Side Bend 20   Cervical - Right Rotation 44   Cervical - Left Rotation 48     Strength    Overall Strength Comments bilat UE strength WFL's      Palpation   Spinal mobility tneder with cervical CPA mobs mid to upper cervical spine    Palpation comment tenderness Rt > Lt occipital ridge; cervical musculature; traps; leveator; pecs      Special Tests    Special Tests --  (-) cervical compression/distraction                    [facility] Adult PT Treatment/Exercise - 12/14/15 0001      Neuro Re-ed    Neuro Re-ed Details  working on posture and alignment      Neck Exercises: Standing   Neck Retraction 5 reps;10 secs   Other Standing Exercises scap squeeze 10 sec x 10     Neck Exercises: Supine   Neck Retraction 5 reps;10 secs     Moist Heat Therapy   Number Minutes Moist Heat 20 Minutes   Moist Heat Location Cervical  thoracic     Electrical Stimulation   Electrical Stimulation Location bilat cervical    Electrical Stimulation Action IFC   Electrical Stimulation Parameters  to tolerance   Electrical Stimulation Goals Pain;Tone                PT Education - 12/14/15 1100    Education provided Yes   Education Details postural correction; HEP; TENS info   Person(s) Educated Patient   Methods Explanation;Demonstration;Tactile cues;Verbal cues;Handout   Comprehension Verbalized understanding;Returned demonstration;Verbal cues required;Tactile cues required             PT Long Term Goals - 12/14/15 1014      PT LONG TERM GOAL #1   Title Improve posture and alignment with pt to demo more upright posture with improved position of cervical spine to decrease muscular tightness and therefore improve headaches and cervical pain 01/25/16   Time 6   Period Weeks   Status New     PT LONG TERM GOAL #2   Title Increase cervical ROM in all planes by 3-5 degrees 01/25/16   Time 6   Period Weeks   Status New     PT LONG TERM GOAL #3   Title Decrease frequency; intendity and duration of headaches by 50-75% 01/25/16   Time 6   Period Weeks    Status New     PT LONG TERM GOAL #4   Title Independent  in HEP 01/25/16   Time 6   Period Weeks   Status New     PT LONG TERM GOAL #5   Title Improve FOTO to </= 25% limitation 01/25/16   Time 6   Period Weeks   Status New               Plan - 12/14/15 1231    Clinical Impression Statement Martha Perkins presents with a 2 week history of Rt sided headache and cervical pain on a daily basis. She has poor posture ad alignment with head forward posture and shoudlers are rounded and elevated. Patient has decreased cervical and bilat UE ROM. She has long standing orthopedic problems including bilat shoudler pain and bilat TKA. Patient has sedentary lifestyle, which has been more so since illness 4/17 relatedto thyroid problems. Martha Perkins has muscular tightness to palpation through the cervical and shoulder girdle area Rt > Lt. There is tightness through the Rt occipital area producing pain radiating into the Rt side of her head.    Rehab Potential Good   PT Frequency 2x / week   PT Duration 6 weeks   PT Treatment/Interventions Patient/family education;ADLs/Self Care Home Management;Neuromuscular re-education;Cryotherapy;Electrical Stimulation;Iontophoresis 4mg /ml Dexamethasone;Moist Heat;Traction;Ultrasound;Dry needling;Manual techniques;Therapeutic activities;Therapeutic exercise   PT Next Visit Plan postural correction; stretching; posterior shoudler girdle strengthening; manual work and passive stretching to cervical spine; modalities as indicated; possibly TDN if indicated    Consulted and Agree with Plan of Care Patient      Patient will benefit from skilled therapeutic intervention in order to improve the following deficits and impairments:  Postural dysfunction, Improper body mechanics, Pain, Decreased range of motion, Decreased mobility, Decreased activity tolerance  Visit Diagnosis: New daily persistent headache - Plan: PT plan of care cert/re-cert  Cervicalgia - Plan: PT plan of care  cert/re-cert  Abnormal posture - Plan: PT plan of care cert/re-cert  Other symptoms and signs involving the musculoskeletal system - Plan: PT plan of care cert/re-cert     Problem List Patient Active Problem List   Diagnosis Date Noted  . Restrictive lung disease 09/28/2015  . Lumbosacral strain 08/15/2015  . Paroxysmal atrial fibrillation ([place]) 08/09/2015  . Hyperthyroidism 08/09/2015  . Chronic allergic rhinitis 05/17/2014  . Injury of right rotator cuff 12/29/2013  . Cataract 06/03/2012  . Hyperlipidemia LDL goal <100 12/21/2010  . Hypertension 12/21/2010  . GERD (gastroesophageal reflux disease) 07/18/2010  . Insulin resistance 07/18/2010  . Hyperlipidemia 07/18/2010  . Fatty liver 07/18/2010    Martha Perkins PT, MPH  12/14/2015, 12:40 PM  [facility] [place], [place], [address] Phone: [phone]   Fax:  [phone]  Name: Martha Perkins MRN: [id number] Date of Birth: 10-15-1945

## 2015-12-14 NOTE — Patient Instructions (Addendum)
Axial Extension (Chin Tuck)    Pull chin in and lengthen back of neck. Hold _10___ seconds while counting out loud. Repeat __5-10__ times. Do _several___ sessions per day.   Shoulder Blade Squeeze    Rotate shoulders back, then squeeze shoulder blades down and back. Hold 10 sec Repeat __10__ times. Do _3-4___ sessions per day.     Flexors, Supine    Lie on back, head on small, rolled towel. Tip chin down. Tighten muscles in back of throat. Hold _10__ seconds. Repeat _5-10__ times per session. Do _2-3__ sessions per day.  TENS UNIT: This is helpful for muscle pain and spasm.   Search and Purchase a TENS 7000 2nd edition at www.tenspros.com. It should be less than $30.     TENS unit instructions: Do not shower or bathe with the unit on Turn the unit off before removing electrodes or batteries If the electrodes lose stickiness add a drop of water to the electrodes after they are disconnected from the unit and place on plastic sheet. If you continued to have difficulty, call the TENS unit company to purchase more electrodes. Do not apply lotion on the skin area prior to use. Make sure the skin is clean and dry as this will help prolong the life of the electrodes. After use, always check skin for unusual red areas, rash or other skin difficulties. If there are any skin problems, does not apply electrodes to the same area. Never remove the electrodes from the unit by pulling the wires. Do not use the TENS unit or electrodes other than as directed. Do not change electrode placement without consultating your therapist or physician. Keep 2 fingers with between each electrode.

## 2015-12-17 ENCOUNTER — Encounter: Payer: Self-pay | Admitting: Rehabilitative and Restorative Service Providers"

## 2015-12-17 ENCOUNTER — Ambulatory Visit (INDEPENDENT_AMBULATORY_CARE_PROVIDER_SITE_OTHER): Payer: Medicare Other | Admitting: Rehabilitative and Restorative Service Providers"

## 2015-12-17 DIAGNOSIS — R29898 Other symptoms and signs involving the musculoskeletal system: Secondary | ICD-10-CM

## 2015-12-17 DIAGNOSIS — G4452 New daily persistent headache (NDPH): Secondary | ICD-10-CM | POA: Diagnosis not present

## 2015-12-17 DIAGNOSIS — M542 Cervicalgia: Secondary | ICD-10-CM

## 2015-12-17 DIAGNOSIS — R293 Abnormal posture: Secondary | ICD-10-CM

## 2015-12-17 NOTE — Patient Instructions (Signed)
Scapula Adduction With Pectoralis Stretch: Low - Standing   Shoulders at 45 hands even with shoulders, keeping weight through legs, shift weight forward until you feel pull or stretch through the front of your chest. Hold _30__ seconds. Do _3__ times, _2-4__ times per day.  ELBOW: Biceps - Standing    Standing in doorway, place one hand on wall, elbow straight. Lean forward. Hold _30_ seconds. _3__ reps  2-3  days per week     ,

## 2015-12-17 NOTE — Therapy (Signed)
[place], [place], [address] Phone: [phone]   Fax:  [phone]  Physical Therapy Treatment  Patient Details  Name: Martha Perkins MRN: [id number] Date of Birth: Nov 14, 1945 Referring Provider: Dr Beatrice Lecher  Encounter Date: 12/17/2015      PT End of Session - 12/17/15 0941    Visit Number 2   Number of Visits 12   Date for PT Re-Evaluation 01/25/16   PT Start Time 0934   PT Stop Time 1025   PT Time Calculation (min) 51 min   Activity Tolerance Patient tolerated treatment well      Past Medical History:  Diagnosis Date  . Depression   . Hyperlipidemia   . Hypertension   . Hyperthyroidism 08/09/2015  . Kidney stone   . Paroxysmal atrial fibrillation ([place]) 08/09/2015  . Thyroid disease     Past Surgical History:  Procedure Laterality Date  . CATARACT EXTRACTION Left 09/2012   Dr. Bunnie Philips  . LITHOTRIPSY    . REPLACEMENT TOTAL KNEE BILATERAL  2011  . THYROIDECTOMY  1971  . TUBAL LIGATION  1979    There were no vitals filed for this visit.      Subjective Assessment - 12/17/15 0942    Subjective Patient reports that she has decreased the number of pillows she is sleeping on. Having difficulty with chin tuck - the position of her head and neck - more confident after review today. no headache currently had a bad headache last night and had to take medication for HA. Ordered TENS unit. Good response to estim and heat in the clinic    Currently in Pain? No/denies   Pain Location Head                         [facility] Adult PT Treatment/Exercise - 12/17/15 0001      Neck Exercises: Theraband   Scapula Retraction 10 reps  with ER of UE's elbows 90 deg at side w/ swim noodle      Neck Exercises: Standing   Neck Retraction 10 reps;10 secs   Other Standing Exercises scap squeeze 10 sec x 10     Neck Exercises: Supine   Neck Retraction 10 secs;10 reps     Moist Heat Therapy   Number Minutes Moist Heat 20 Minutes   Moist Heat Location Cervical;Lumbar Spine  thoracic     Electrical Stimulation   Electrical Stimulation Location bilat cervical; Rt ant and trap    Electrical Stimulation Action IFC   Electrical Stimulation Parameters to tolerance   Electrical Stimulation Goals Pain;Tone     Manual Therapy   Manual therapy comments pt hooklying supine LE's supported on bolster    Joint Mobilization CPA and lateral mobs Grade II/III cervical    Soft tissue mobilization anterior/lateral/posterior cervical musculature; uppert traps; leveator pecs Rt > Lt     Myofascial Release anterior chest; Rt upper trap    Passive ROM cervical roation and lateral flexion to pt tolerance - gently    Manual Traction 20-30 sec throughout manual work 3-4 times      Neck Exercises: Stretches   Other Neck Stretches doorway lower position 30 sec x 3   shoulder pathology prevents higher positions    Other Neck Stretches biceps stretch in doorway bilat 30 sec hold x 3                 PT Education - 12/17/15 [id number]  Education provided Yes   Education Details HEP    Person(s) Educated Patient   Methods Explanation;Demonstration;Tactile cues;Verbal cues;Handout   Comprehension Verbalized understanding;Returned demonstration;Verbal cues required;Tactile cues required             PT Long Term Goals - 12/17/15 0942      PT LONG TERM GOAL #1   Title Improve posture and alignment with pt to demo more upright posture with improved position of cervical spine to decrease muscular tightness and therefore improve headaches and cervical pain 01/25/16   Time 6   Period Weeks   Status On-going     PT LONG TERM GOAL #2   Title Increase cervical ROM in all planes by 3-5 degrees 01/25/16   Time 6   Period Weeks   Status On-going     PT LONG TERM GOAL #3   Title Decrease frequency; intendity and duration of headaches by 50-75% 01/25/16   Time 6   Period  Weeks   Status On-going     PT LONG TERM GOAL #4   Title Independent in HEP 01/25/16   Time 6   Period Weeks   Status On-going     PT LONG TERM GOAL #5   Title Improve FOTO to </= 25% limitation 01/25/16   Time 6   Period Weeks   Status On-going               Plan - 12/17/15 1028    Clinical Impression Statement Improved postural correction with instruction today. Patient has not worked on the exercises at home much since the initial visit. Reviewed and corrected exercises; continued postural correction; HEP. Longstanding shoulder problems prevent higher levels of stretching for pecs - will progress gradually as her body tolerates. Good release of tightness through the occipital region with manual work.    Rehab Potential Good   PT Frequency 2x / week   PT Duration 6 weeks   PT Treatment/Interventions Patient/family education;ADLs/Self Care Home Management;Neuromuscular re-education;Cryotherapy;Electrical Stimulation;Iontophoresis 4mg /ml Dexamethasone;Moist Heat;Traction;Ultrasound;Dry needling;Manual techniques;Therapeutic activities;Therapeutic exercise   PT Next Visit Plan postural correction; stretching; posterior shoudler girdle strengthening; manual work and passive stretching to cervical spine; modalities as indicated; possibly TDN if indicated    Consulted and Agree with Plan of Care Patient      Patient will benefit from skilled therapeutic intervention in order to improve the following deficits and impairments:  Postural dysfunction, Improper body mechanics, Pain, Decreased range of motion, Decreased mobility, Decreased activity tolerance  Visit Diagnosis: New daily persistent headache  Cervicalgia  Abnormal posture  Other symptoms and signs involving the musculoskeletal system     Problem List Patient Active Problem List   Diagnosis Date Noted  . Restrictive lung disease 09/28/2015  . Lumbosacral strain 08/15/2015  . Paroxysmal atrial fibrillation  ([place]) 08/09/2015  . Hyperthyroidism 08/09/2015  . Chronic allergic rhinitis 05/17/2014  . Injury of right rotator cuff 12/29/2013  . Cataract 06/03/2012  . Hyperlipidemia LDL goal <100 12/21/2010  . Hypertension 12/21/2010  . GERD (gastroesophageal reflux disease) 07/18/2010  . Insulin resistance 07/18/2010  . Hyperlipidemia 07/18/2010  . Fatty liver 07/18/2010    Celyn Nilda Simmer PT, MPH  12/17/2015, 10:32 AM  [facility] [place], [place], [address] Phone: [phone]   Fax:  [phone]  Name: Martha Perkins MRN: [id number] Date of Birth: 1945/05/03

## 2015-12-21 ENCOUNTER — Encounter: Payer: Self-pay | Admitting: Rehabilitative and Restorative Service Providers"

## 2015-12-21 ENCOUNTER — Ambulatory Visit (INDEPENDENT_AMBULATORY_CARE_PROVIDER_SITE_OTHER): Payer: Medicare Other | Admitting: Rehabilitative and Restorative Service Providers"

## 2015-12-21 DIAGNOSIS — G4452 New daily persistent headache (NDPH): Secondary | ICD-10-CM | POA: Diagnosis not present

## 2015-12-21 DIAGNOSIS — R29898 Other symptoms and signs involving the musculoskeletal system: Secondary | ICD-10-CM | POA: Diagnosis not present

## 2015-12-21 DIAGNOSIS — R293 Abnormal posture: Secondary | ICD-10-CM | POA: Diagnosis not present

## 2015-12-21 DIAGNOSIS — M542 Cervicalgia: Secondary | ICD-10-CM

## 2015-12-21 NOTE — Therapy (Signed)
[place], [place], [address] Phone: [phone]   Fax:  [phone]  Physical Therapy Treatment  Patient Details  Name: Martha Perkins MRN: [id number] Date of Birth: [birth year] Referring Provider: Dr Beatrice Lecher  Encounter Date: 12/21/2015      PT End of Session - 12/21/15 1028    Visit Number 3   Number of Visits 12   Date for PT Re-Evaluation 01/25/16   PT Start Time 1018   PT Stop Time 1104   PT Time Calculation (min) 46 min   Activity Tolerance Patient tolerated treatment well      Past Medical History:  Diagnosis Date  . Depression   . Hyperlipidemia   . Hypertension   . Hyperthyroidism 08/09/2015  . Kidney stone   . Paroxysmal atrial fibrillation ([place]) 08/09/2015  . Thyroid disease     Past Surgical History:  Procedure Laterality Date  . CATARACT EXTRACTION Left 09/2012   Dr. Bunnie Philips  . LITHOTRIPSY    . REPLACEMENT TOTAL KNEE BILATERAL  2011  . THYROIDECTOMY  1971  . TUBAL LIGATION  1979    There were no vitals filed for this visit.      Subjective Assessment - 12/21/15 1029    Subjective Continues to have headaches intermittently. Has a bad HA this am - feels she is more stressed this week. Does think the therapy is helpful. Trying to work on posture at home. Some of the exercises hurt - specifically the doorway stretch.   Good improvement in migraine with treatment. Pain decreased from 98/10 to 2/10 with gentle stretching; manual work; modalities.    Currently in Pain? Yes   Pain Score 8    Pain Location Head   Pain Orientation Right   Pain Descriptors / Indicators Burning;Pounding                         [facility] Adult PT Treatment/Exercise - 12/21/15 0001      Neck Exercises: Supine   Neck Retraction 10 secs;10 reps     Moist Heat Therapy   Number Minutes Moist Heat 20 Minutes   Moist Heat Location Cervical;Lumbar Spine  thoracic     Electrical Stimulation   Electrical Stimulation Location bilat cervical; Rt ant and trap    Electrical Stimulation Action IFC   Electrical Stimulation Parameters to tolerance   Electrical Stimulation Goals Pain;Tone     Manual Therapy   Manual therapy comments pt hooklying supine LE's supported on bolster    Joint Mobilization CPA and lateral mobs Grade II/III cervical    Soft tissue mobilization anterior/lateral/posterior cervical musculature; uppert traps; leveator pecs Rt > Lt     Myofascial Release anterior chest; Rt upper trap    Passive ROM cervical roation and lateral flexion to pt tolerance - gently    Manual Traction 20-30 sec throughout manual work 3-4 times      Neck Exercises: Stretches   Other Neck Stretches doorway lower position 30 sec x 3   shoulder pathology prevents higher positions    Other Neck Stretches biceps stretch in doorway bilat 30 sec hold x 3                 PT Education - 12/21/15 1102    Education provided Yes   Education Details reviewed HEP encouraging pt to stretch gently - not to the point of pain    Person(s) Educated Patient   Methods  Explanation   Comprehension Verbalized understanding             PT Long Term Goals - 12/17/15 0942      PT LONG TERM GOAL #1   Title Improve posture and alignment with pt to demo more upright posture with improved position of cervical spine to decrease muscular tightness and therefore improve headaches and cervical pain 01/25/16   Time 6   Period Weeks   Status On-going     PT LONG TERM GOAL #2   Title Increase cervical ROM in all planes by 3-5 degrees 01/25/16   Time 6   Period Weeks   Status On-going     PT LONG TERM GOAL #3   Title Decrease frequency; intendity and duration of headaches by 50-75% 01/25/16   Time 6   Period Weeks   Status On-going     PT LONG TERM GOAL #4   Title Independent in HEP 01/25/16   Time 6   Period Weeks   Status On-going     PT LONG TERM GOAL #5    Title Improve FOTO to </= 25% limitation 01/25/16   Time 6   Period Weeks   Status On-going               Plan - 12/21/15 1102    Clinical Impression Statement Patient presents today with severe migraine. She has difficulty with stretches at home and is likely to aggresive with stretching. 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    Rehab Potential Good   PT Frequency 2x / week   PT Duration 6 weeks   PT Treatment/Interventions Patient/family education;ADLs/Self Care Home Management;Neuromuscular re-education;Cryotherapy;Electrical Stimulation;Iontophoresis 4mg /ml Dexamethasone;Moist Heat;Traction;Ultrasound;Dry needling;Manual techniques;Therapeutic activities;Therapeutic exercise   PT Next Visit Plan postural correction; stretching; posterior shoudler girdle strengthening; manual work and passive stretching to cervical spine; modalities as indicated; possibly TDN if indicated    Consulted and Agree with Plan of Care Patient      Patient will benefit from skilled therapeutic intervention in order to improve the following deficits and impairments:  Postural dysfunction, Improper body mechanics, Pain, Decreased range of motion, Decreased mobility, Decreased activity tolerance  Visit Diagnosis: New daily persistent headache  Cervicalgia  Abnormal posture  Other symptoms and signs involving the musculoskeletal system     Problem List Patient Active Problem List   Diagnosis Date Noted  . Restrictive lung disease 09/28/2015  . Lumbosacral strain 08/15/2015  . Paroxysmal atrial fibrillation ([place]) 08/09/2015  . Hyperthyroidism 08/09/2015  . Chronic allergic rhinitis 05/17/2014  . Injury of right rotator cuff 12/29/2013  . Cataract 06/03/2012  . Hyperlipidemia LDL goal <100 12/21/2010  . Hypertension 12/21/2010  . GERD (gastroesophageal reflux disease) 07/18/2010  . Insulin resistance 07/18/2010  . Hyperlipidemia  07/18/2010  . Fatty liver 07/18/2010    Martha Perkins Nilda Simmer PT, MPH  12/21/2015, 1:05 PM  [facility] [place], [place], [address] Phone: [phone]   Fax:  [phone]  Name: Martha Perkins MRN: [id number] Date of Birth: 1946-01-22

## 2015-12-24 ENCOUNTER — Ambulatory Visit (INDEPENDENT_AMBULATORY_CARE_PROVIDER_SITE_OTHER): Payer: Medicare Other | Admitting: Physical Therapy

## 2015-12-24 DIAGNOSIS — R29898 Other symptoms and signs involving the musculoskeletal system: Secondary | ICD-10-CM

## 2015-12-24 DIAGNOSIS — M542 Cervicalgia: Secondary | ICD-10-CM

## 2015-12-24 DIAGNOSIS — R293 Abnormal posture: Secondary | ICD-10-CM | POA: Diagnosis not present

## 2015-12-24 DIAGNOSIS — G4452 New daily persistent headache (NDPH): Secondary | ICD-10-CM

## 2015-12-24 NOTE — Patient Instructions (Addendum)
[place]: Single Arm    Arms near sides, palms up. Press one arm lightly into floor, slide arm out to side Keep contact with bed throughout motion. At maximal position, lengthen arm. Hold _30__ seconds. Relax. Repeat _3__ times. Slide arm back to start. Repeat with other arm.  Thoracic Lift    Press shoulders down. Then lift mid-thoracic spine (area between the shoulder blades). Lift the breastbone slightly. Hold _5__ seconds. Relax. Repeat _10__ times.  Head Press With [facility] With Chin Tuck    Tuck chin SLIGHTLY toward chest, keep mouth closed. Feel weight on back of head. Increase weight by pressing head down. Hold _3-5__ seconds. Relax. Repeat _10__ times. Surface: floor    [facility] [place], [place] [address]  [phone] (office) [phone] (fax)

## 2015-12-24 NOTE — Therapy (Signed)
[place], [place], [address] Phone: [phone]   Fax:  [phone]  Physical Therapy Treatment  Patient Details  Name: Martha Perkins MRN: [id number] Date of Birth: 08-21-1945 Referring Provider: [name]  Encounter Date: 12/24/2015      PT End of Session - 12/24/15 1022    Visit Number 4   Number of Visits 12   Date for PT Re-Evaluation 01/25/16   PT Start Time 1021   PT Stop Time 1122   PT Time Calculation (min) 61 min   Activity Tolerance Patient tolerated treatment well   Behavior During Therapy [facility] for tasks assessed/performed      Past Medical History:  Diagnosis Date  . Depression   . Hyperlipidemia   . Hypertension   . Hyperthyroidism 08/09/2015  . Kidney stone   . Paroxysmal atrial fibrillation ([place]) 08/09/2015  . Thyroid disease     Past Surgical History:  Procedure Laterality Date  . CATARACT EXTRACTION Left 09/2012   Dr. Bunnie Philips  . LITHOTRIPSY    . REPLACEMENT TOTAL KNEE BILATERAL  2011  . THYROIDECTOMY  1971  . TUBAL LIGATION  1979    There were no vitals filed for this visit.      Subjective Assessment - 12/24/15 1022    Currently in Pain? Yes   Pain Score 3    Pain Location Neck   Pain Orientation Right   Pain Descriptors / Indicators Dull;Tightness   Aggravating Factors  ???   Pain Relieving Factors lying down, medication             [facility] PT Assessment - 12/24/15 0001      Assessment   Medical Diagnosis Persistent headache; cervical pain    Referring Provider [name]   Onset Date/Surgical Date 11/28/15     AROM   Cervical - Right Side Bend 20   Cervical - Left Side Bend 13   Cervical - Right Rotation 48   Cervical - Left Rotation 40            [facility] Adult PT Treatment/Exercise - 12/24/15 0001      Neck Exercises: Supine   Neck Retraction 10 reps;3 secs   Cervical Rotation Right;Left;5 reps   Shoulder  Flexion Right;Left;5 reps   Other Supine Exercise scap squeeze x 5 sec x 10 reps   Other Supine Exercise snow angel to tolerance x 5 reps x 25 sec ; head press x 3 sec x 10 reps      Moist Heat Therapy   Moist Heat Location Cervical     Electrical Stimulation   Electrical Stimulation Location bilat cervical; Rt ant and trap    Electrical Stimulation Action IFC   Electrical Stimulation Parameters to tolerance   Electrical Stimulation Goals Pain     Manual Therapy   Manual therapy comments pt hooklying supine LE's supported on bolster    Soft tissue mobilization cervical paraspinals, bilat upper trap, Rt pec    Passive ROM cervical roation and lateral flexion to pt tolerance - gently    Manual Traction 20-30 sec throughout manual work 3-4 times      Neck Exercises: Stretches   Upper Trapezius Stretch 3 reps;10 seconds  very limited lateral flexion            PT Education - 12/24/15 1057    Education provided Yes   Education Details HEP   Person(s) Educated Patient   Methods Explanation;Handout;Demonstration;Tactile cues  Comprehension Verbalized understanding;Returned demonstration             PT Long Term Goals - 12/17/15 0942      PT LONG TERM GOAL #1   Title Improve posture and alignment with pt to demo more upright posture with improved position of cervical spine to decrease muscular tightness and therefore improve headaches and cervical pain 01/25/16   Time 6   Period Weeks   Status On-going     PT LONG TERM GOAL #2   Title Increase cervical ROM in all planes by 3-5 degrees 01/25/16   Time 6   Period Weeks   Status On-going     PT LONG TERM GOAL #3   Title Decrease frequency; intendity and duration of headaches by 50-75% 01/25/16   Time 6   Period Weeks   Status On-going     PT LONG TERM GOAL #4   Title Independent in HEP 01/25/16   Time 6   Period Weeks   Status On-going     PT LONG TERM GOAL #5   Title Improve FOTO to </= 25% limitation  01/25/16   Time 6   Period Weeks   Status On-going               Plan - 12/24/15 1105    Clinical Impression Statement Pt demonstrated decreased cervical ROM, however her neck / head pain has decreased since starting therapy.  She tolerated supine exercises well; added new exercises to HEP.     Rehab Potential Good   PT Frequency 2x / week   PT Duration 6 weeks   PT Treatment/Interventions Patient/family education;ADLs/Self Care Home Management;Neuromuscular re-education;Cryotherapy;Electrical Stimulation;Iontophoresis 4mg /ml Dexamethasone;Moist Heat;Traction;Ultrasound;Dry needling;Manual techniques;Therapeutic activities;Therapeutic exercise   PT Next Visit Plan Continue postural strengthing/ stretching, progressive ROM.  Manual therapy and TDN as indicated.    Consulted and Agree with Plan of Care Patient      Patient will benefit from skilled therapeutic intervention in order to improve the following deficits and impairments:  Postural dysfunction, Improper body mechanics, Pain, Decreased range of motion, Decreased mobility, Decreased activity tolerance  Visit Diagnosis: New daily persistent headache  Cervicalgia  Abnormal posture  Other symptoms and signs involving the musculoskeletal system     Problem List Patient Active Problem List   Diagnosis Date Noted  . Restrictive lung disease 09/28/2015  . Lumbosacral strain 08/15/2015  . Paroxysmal atrial fibrillation ([place]) 08/09/2015  . Hyperthyroidism 08/09/2015  . Chronic allergic rhinitis 05/17/2014  . Injury of right rotator cuff 12/29/2013  . Cataract 06/03/2012  . Hyperlipidemia LDL goal <100 12/21/2010  . Hypertension 12/21/2010  . GERD (gastroesophageal reflux disease) 07/18/2010  . Insulin resistance 07/18/2010  . Hyperlipidemia 07/18/2010  . Fatty liver 07/18/2010   Kerin Perna, PTA 12/24/15 11:09 AM  [place], [place], [address] Phone: [phone]   Fax:  [phone]  Name: Martha Perkins MRN: [id number] Date of Birth: Feb 16, 1946

## 2015-12-25 ENCOUNTER — Other Ambulatory Visit: Payer: Self-pay

## 2015-12-25 MED ORDER — KETOCONAZOLE 2 % EX CREA: TOPICAL_CREAM | Freq: Every day | CUTANEOUS | 1 refills | Status: DC | PRN

## 2015-12-28 ENCOUNTER — Ambulatory Visit (INDEPENDENT_AMBULATORY_CARE_PROVIDER_SITE_OTHER): Payer: Medicare Other | Admitting: Physical Therapy

## 2015-12-28 DIAGNOSIS — R29898 Other symptoms and signs involving the musculoskeletal system: Secondary | ICD-10-CM | POA: Diagnosis not present

## 2015-12-28 DIAGNOSIS — G4452 New daily persistent headache (NDPH): Secondary | ICD-10-CM

## 2015-12-28 DIAGNOSIS — R293 Abnormal posture: Secondary | ICD-10-CM | POA: Diagnosis not present

## 2015-12-28 DIAGNOSIS — M542 Cervicalgia: Secondary | ICD-10-CM

## 2015-12-28 NOTE — Therapy (Addendum)
[place], [place], [address] Phone: [phone]   Fax:  [phone]  Physical Therapy Treatment  Patient Details  Name: Martha Perkins MRN: [id number] Date of Birth: August 20, 1945 Referring Provider: [name]  Encounter Date: 12/28/2015      PT End of Session - 12/28/15 1103    Visit Number 5   Number of Visits 12   Date for PT Re-Evaluation 01/25/16   PT Start Time 1022   PT Stop Time 1122   PT Time Calculation (min) 60 min   Activity Tolerance Patient limited by pain      Past Medical History:  Diagnosis Date  . Depression   . Hyperlipidemia   . Hypertension   . Hyperthyroidism 08/09/2015  . Kidney stone   . Paroxysmal atrial fibrillation ([place]) 08/09/2015  . Thyroid disease     Past Surgical History:  Procedure Laterality Date  . CATARACT EXTRACTION Left 09/2012   Dr. Bunnie Philips  . LITHOTRIPSY    . REPLACEMENT TOTAL KNEE BILATERAL  2011  . THYROIDECTOMY  1971  . TUBAL LIGATION  1979    There were no vitals filed for this visit.      Subjective Assessment - 12/28/15 1026    Subjective Pt reports she has had a rough couple of days.  She cleaned her house yesterday. No headache today.     Currently in Pain? Yes   Pain Score 4    Pain Location Back  shoulders to low back.,    Pain Orientation Right;Left;Mid;Lower   Aggravating Factors  ???   Pain Relieving Factors lying down, resting.             [facility] PT Assessment - 12/28/15 0001      Assessment   Medical Diagnosis Persistent headache; cervical pain    Onset Date/Surgical Date 11/28/15   Hand Dominance Right   Next MD Visit 11/17          [facility] Adult PT Treatment/Exercise - 12/28/15 0001      Neck Exercises: Standing   Other Standing Exercises scap squeeze 5 sec x 10   Other Standing Exercises shoulder circles 5 reps CW/CCW     Neck Exercises: Supine   Neck Retraction 10 reps;3 secs   Other  Supine Exercise bilat shoulder ER, horiz abdct, flexion with yellow band x 5 reps x 2 sets, then shoulder diagonals x 5 reps with yellow band (all exercises to tolerance)   Other Supine Exercise snow angel to tolerance x 5 reps x 25 sec       Moist Heat Therapy   Number Minutes Moist Heat 20 Minutes   Moist Heat Location Cervical     Electrical Stimulation   Electrical Stimulation Location bilat cervical; Rt ant and trap    Electrical Stimulation Action IFC   Electrical Stimulation Parameters to tolerance    Electrical Stimulation Goals Pain     Manual Therapy   Manual therapy comments pt hooklying supine LE's supported on bolster    Soft tissue mobilization cervical paraspinals, bilat upper trap, Rt pec    Myofascial Release suboccipital release.                      PT Long Term Goals - 12/17/15 6144      PT LONG TERM GOAL #1   Title Improve posture and alignment with pt to demo more upright posture with improved position of cervical spine to decrease  muscular tightness and therefore improve headaches and cervical pain 01/25/16   Time 6   Period Weeks   Status On-going     PT LONG TERM GOAL #2   Title Increase cervical ROM in all planes by 3-5 degrees 01/25/16   Time 6   Period Weeks   Status On-going     PT LONG TERM GOAL #3   Title Decrease frequency; intendity and duration of headaches by 50-75% 01/25/16   Time 6   Period Weeks   Status On-going     PT LONG TERM GOAL #4   Title Independent in HEP 01/25/16   Time 6   Period Weeks   Status On-going     PT LONG TERM GOAL #5   Title Improve FOTO to </= 25% limitation 01/25/16   Time 6   Period Weeks   Status On-going               Plan - 12/28/15 1304    Clinical Impression Statement Pt arrived with increased pain in body, mostly likely from cleaning her house and shopping yesterday.  She tolerated supine exercises well.  She had slightly less guarding with manual therapy.  She reported  slight decrease in pain after manual therapy and end of session.  Making gradual gains towards established goals.    Rehab Potential Good   PT Frequency 2x / week   PT Duration 6 weeks   PT Treatment/Interventions Patient/family education;ADLs/Self Care Home Management;Neuromuscular re-education;Cryotherapy;Electrical Stimulation;Iontophoresis 89m/ml Dexamethasone;Moist Heat;Traction;Ultrasound;Dry needling;Manual techniques;Therapeutic activities;Therapeutic exercise   PT Next Visit Plan Continue postural strengthing/ stretching, progressive ROM.  Manual therapy and TDN as indicated.    Consulted and Agree with Plan of Care Patient      Patient will benefit from skilled therapeutic intervention in order to improve the following deficits and impairments:  Postural dysfunction, Improper body mechanics, Pain, Decreased range of motion, Decreased mobility, Decreased activity tolerance  Visit Diagnosis: New daily persistent headache  Cervicalgia  Abnormal posture  Other symptoms and signs involving the musculoskeletal system     Problem List Patient Active Problem List   Diagnosis Date Noted  . Restrictive lung disease 09/28/2015  . Lumbosacral strain 08/15/2015  . [place] 08/[place]  . Hyperthyroidism 08/09/2015  . Chronic allergic rhinitis 05/17/2014  . Injury of right rotator cuff 12/29/2013  . Cataract 06/03/2012  . Hyperlipidemia LDL goal <100 12/21/2010  . Hypertension 12/21/2010  . GERD (gastroesophageal reflux disease) 07/18/2010  . Insulin resistance 07/18/2010  . Hyperlipidemia 07/18/2010  . Fatty liver 07/18/2010   JKerin Perna PTA 12/28/15 1:06 PM  [id number] [address]Phone: [phone]  Fax:  [phone] Name: Martha MccuistionMRN: [id number] of Birth: [birth year] PHYSICAL THERAPY DISCHARGE SUMMARY  Visits from Start of  Care: 5  Current functional level related to goals / functional outcomes: See last PT progress note for discharge status.   Remaining deficits: See note.   Education / Equipment: HEP Plan: Patient agrees to discharge.  Patient goals were partially met. Patient is being discharged due to not returning since the last visit.  ?????    Martha P. HHelene KelpPT, MPH 02/06/16 3:53 PM

## 2016-01-08 ENCOUNTER — Ambulatory Visit (INDEPENDENT_AMBULATORY_CARE_PROVIDER_SITE_OTHER): Payer: Medicare Other | Admitting: Endocrinology

## 2016-01-08 ENCOUNTER — Encounter: Payer: Self-pay | Admitting: Endocrinology

## 2016-01-08 DIAGNOSIS — E890 Postprocedural hypothyroidism: Secondary | ICD-10-CM | POA: Diagnosis not present

## 2016-01-08 LAB — T4, FREE: Free T4: 2.77 ng/dL — ABNORMAL HIGH (ref 0.60–1.60)

## 2016-01-08 LAB — TSH: TSH: 0.05 u[IU]/mL — AB (ref 0.35–4.50)

## 2016-01-08 NOTE — Patient Instructions (Signed)
blood tests are requested for you today.  We'll let you know about the results.  Please come back for a follow-up appointment in 1 month.  

## 2016-01-08 NOTE — Progress Notes (Signed)
Subjective:    Patient ID: Martha Perkins, female    DOB: 1946-01-04, 70 y.o.   MRN: [id number]  HPI Pt returns for f/u of hyperthyroidism (dx'ed April, 2017, in w/u of AF; she had partial right thyroid lobectomy in 1970; she was rx'ed tapazole and toprol, due to the need for prompt improvement; she had RAI in July of 2017; due to AF, she resumed tapazole after RAI; in Sept of 2017, tapazole was d/c'ed, due to hypothyroidism, and synthroid was started).  pt states she feels much better in general.   Past Medical History:  Diagnosis Date  . Depression   . Hyperlipidemia   . Hypertension   . Hyperthyroidism 08/09/2015  . Kidney stone   . Paroxysmal atrial fibrillation ([place]) 08/09/2015  . Thyroid disease     Past Surgical History:  Procedure Laterality Date  . CATARACT EXTRACTION Left 09/2012   Dr. Bunnie Philips  . LITHOTRIPSY    . REPLACEMENT TOTAL KNEE BILATERAL  2011  . THYROIDECTOMY  1971  . TUBAL LIGATION  1979    Social History   Social History  . Marital status: Married    Spouse name: N/A  . Number of children: N/A  . Years of education: N/A   Occupational History  . Retired Therapist, sports.     Social History Main Topics  . Smoking status: Never Smoker  . Smokeless tobacco: Not on file  . Alcohol use No  . Drug use: No  . Sexual activity: Not on file   Other Topics Concern  . Not on file   Social History Narrative  . No narrative on file    Current Outpatient Prescriptions on File Prior to Visit  Medication Sig Dispense Refill  . albuterol (PROAIR HFA) 108 (90 Base) MCG/ACT inhaler Inhale 2 puffs into the lungs every 6 (six) hours as needed for wheezing or shortness of breath. 18 each 3  . atorvastatin (LIPITOR) 40 MG tablet Take 1 tablet (40 mg total) by mouth daily. 90 tablet 0  . ELIQUIS 5 MG TABS tablet TAKE ONE TABLET BY MOUTH TWICE DAILY 60 tablet 5  . esomeprazole (NEXIUM) 40 MG capsule Take 1 capsule (40 mg total) by mouth daily. 90 capsule 0  . fluticasone  (FLONASE) 50 MCG/ACT nasal spray Place 1 spray into both nostrils 2 (two) times daily. 48 g 1  . hydrochlorothiazide (HYDRODIURIL) 12.5 MG tablet Take 12.5 mg by mouth as needed.     Marland Kitchen ketoconazole (NIZORAL) 2 % cream Apply topically daily as needed for irritation. 30 g 1  . metoprolol succinate (TOPROL-XL) 100 MG 24 hr tablet Take 1 tablet (100 mg total) by mouth daily. 90 tablet 1  . traMADol (ULTRAM) 50 MG tablet Take 1 tablet (50 mg total) by mouth 3 (three) times daily. 270 tablet 0  . diltiazem (CARDIZEM CD) 120 MG 24 hr capsule Take 1 capsule (120 mg total) by mouth daily. 30 capsule 5   No current facility-administered medications on file prior to visit.     Allergies  Allergen Reactions  . Demerol Nausea Only  . Meperidine Hcl   . Oxycodone-Aspirin   . Percocet [Oxycodone-Acetaminophen]     Family History  Problem Relation Age of Onset  . Coronary artery disease Mother 11    deceased  . Depression Mother   . Diabetes Paternal Grandmother   . Hyperlipidemia Mother   . Thyroid disease Sister     BP 120/88 (BP Location: Left Arm, Patient Position: Sitting)  Pulse 79   Wt 187 lb (84.8 kg)   SpO2 96%   BMI 34.20 kg/m    Review of Systems No weight change.      Objective:   Physical Exam VITAL SIGNS:  See vs page GENERAL: no distress NECK: a healed scar is present.  left thyroid mass is again noted.    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    Patient is advised the following: Patient Instructions  blood tests are requested for you today.  We'll let you know about the results.  Please come back for a follow-up appointment in 1 month.

## 2016-01-11 ENCOUNTER — Encounter: Payer: Self-pay | Admitting: Family Medicine

## 2016-01-11 ENCOUNTER — Ambulatory Visit (INDEPENDENT_AMBULATORY_CARE_PROVIDER_SITE_OTHER): Payer: Medicare Other | Admitting: Family Medicine

## 2016-01-11 VITALS — BP 106/69 | HR 97 | Ht 62.0 in | Wt 185.0 lb

## 2016-01-11 DIAGNOSIS — Z780 Asymptomatic menopausal state: Secondary | ICD-10-CM

## 2016-01-11 DIAGNOSIS — Z0000 Encounter for general adult medical examination without abnormal findings: Secondary | ICD-10-CM | POA: Diagnosis not present

## 2016-01-11 NOTE — Progress Notes (Addendum)
Subjective:   Martha Perkins is a 70 y.o. female who presents for Medicare Annual (Subsequent) preventive examination.  Atrial fibrillation-she's hoping that she'll be able to get her ablation seen but she is waiting for her thyroid to regulate before they'll do it. And she'll need to be on anti-coagulation for about 6 months after that.  Review of Systems:  Comprehensive ROS is negative.        Objective:     Vitals: BP 106/69   Pulse 97   Ht 5\' 2"  (1.575 m)   Wt 185 lb (83.9 kg)   SpO2 98%   BMI 33.84 kg/m   Body mass index is 33.84 kg/m.   Tobacco History  Smoking Status  . Never Smoker  Smokeless Tobacco  . Not on file     Counseling given: Not Answered   Past Medical History:  Diagnosis Date  . Depression   . Hyperlipidemia   . Hypertension   . Hyperthyroidism 08/09/2015  . Kidney stone   . Paroxysmal atrial fibrillation ([place]) 08/09/2015  . Thyroid disease    Past Surgical History:  Procedure Laterality Date  . CATARACT EXTRACTION Left 09/2012   Dr. Bunnie Philips  . LITHOTRIPSY    . REPLACEMENT TOTAL KNEE BILATERAL  2011  . THYROIDECTOMY  1971  . TUBAL LIGATION  1979   Family History  Problem Relation Age of Onset  . Coronary artery disease Mother 57    deceased  . Depression Mother   . Diabetes Paternal Grandmother   . Hyperlipidemia Mother   . Thyroid disease Sister    History  Sexual Activity  . Sexual activity: Not on file    Outpatient Encounter Prescriptions as of 01/11/2016  Medication Sig  . albuterol (PROAIR HFA) 108 (90 Base) MCG/ACT inhaler Inhale 2 puffs into the lungs every 6 (six) hours as needed for wheezing or shortness of breath.  Marland Kitchen atorvastatin (LIPITOR) 40 MG tablet Take 1 tablet (40 mg total) by mouth daily.  Marland Kitchen ELIQUIS 5 MG TABS tablet TAKE ONE TABLET BY MOUTH TWICE DAILY  . esomeprazole (NEXIUM) 40 MG capsule Take 1 capsule (40 mg total) by mouth daily.  . fluticasone (FLONASE) 50 MCG/ACT nasal spray Place 1 spray into  both nostrils 2 (two) times daily.  . hydrochlorothiazide (HYDRODIURIL) 12.5 MG tablet Take 12.5 mg by mouth as needed.   Marland Kitchen ketoconazole (NIZORAL) 2 % cream Apply topically daily as needed for irritation.  . metoprolol succinate (TOPROL-XL) 100 MG 24 hr tablet Take 1 tablet (100 mg total) by mouth daily.  . traMADol (ULTRAM) 50 MG tablet Take 1 tablet (50 mg total) by mouth 3 (three) times daily.  Marland Kitchen diltiazem (CARDIZEM CD) 120 MG 24 hr capsule Take 1 capsule (120 mg total) by mouth daily.   No facility-administered encounter medications on file as of 01/11/2016.     Activities of Daily Living In your present state of health, do you have any difficulty performing the following activities: 01/11/2016  Hearing? N  Vision? N  Difficulty concentrating or making decisions? N  Walking or climbing stairs? Y  Dressing or bathing? Y  Doing errands, shopping? N  Some recent data might be hidden    Patient Care Team: Hali Marry, MD as PCP - General (Family Medicine)    Assessment:     Exercise Activities and Dietary recommendations Current Exercise Habits: The patient does not participate in regular exercise at present, Exercise limited by: cardiac condition(s) (afib)  Goals  None     Fall Risk Fall Risk  07/13/2015 07/03/2014 05/31/2013  Falls in the past year? No No No   Depression Screen PHQ 2/9 Scores 07/13/2015 07/03/2014 05/31/2013  PHQ - 2 Score 0 0 0     Cognitive Function     6CIT Screen 01/11/2016  What Year? 0 points  What month? 0 points  What time? 0 points  Count back from 20 0 points  Months in reverse 0 points  Repeat phrase 0 points  Total Score 0    Immunization History  Administered Date(s) Administered  . Influenza Split 01/27/2012  . Influenza,inj,Quad PF,36+ Mos 11/26/2012, 12/29/2013, 12/14/2014, 11/13/2015  . Pneumococcal Conjugate-13 12/29/2013  . Pneumococcal Polysaccharide-23 11/26/2012  . Tdap 07/02/2015   Screening Tests Health  Maintenance  Topic Date Due  . DEXA SCAN  09/11/2012  . MAMMOGRAM  01/22/2017  . COLONOSCOPY  02/19/2025  . TETANUS/TDAP  07/01/2025  . INFLUENZA VACCINE  Addressed  . ZOSTAVAX  Completed  . Hepatitis C Screening  Completed  . PNA vac Low Risk Adult  Completed      Plan:     Medicare Wellness Exam  During the course of the visit the patient was educated and counseled about the following appropriate screening and preventive services:   Vaccines to include Pneumoccal, Influenza, Hepatitis B, Td, Zostavax, HCV  Cardiovascular Disease  Colorectal cancer screening  Bone density screening.  We can try to get this scheduled at the same time as her mammogram.  Diabetes screening  Glaucoma screening  Mammography/PAP  - Due later this month. She plans to schedule.   Patient Instructions (the written plan) was given to the patient.   METHENEY,CATHERINE, MD  01/11/2016

## 2016-01-11 NOTE — Patient Instructions (Signed)
Keep up a regular exercise program and make sure you are eating a healthy diet Try to eat 4 servings of dairy a day, or if you are lactose intolerant take a calcium with vitamin D daily.  Your vaccines are up to date.   

## 2016-01-14 ENCOUNTER — Ambulatory Visit (INDEPENDENT_AMBULATORY_CARE_PROVIDER_SITE_OTHER): Payer: Medicare Other | Admitting: Sports Medicine

## 2016-01-14 ENCOUNTER — Encounter: Payer: Self-pay | Admitting: Sports Medicine

## 2016-01-14 DIAGNOSIS — S46001D Unspecified injury of muscle(s) and tendon(s) of the rotator cuff of right shoulder, subsequent encounter: Secondary | ICD-10-CM

## 2016-01-14 NOTE — Assessment & Plan Note (Signed)
Multifactorial shoulder pain including glenohumeral osteoarthritis, rotator cuff tendinosis, biceps tendinosis. Glenohumeral and subacromial injections today. Return in one month, we will consider doing the left side if she has a good response.

## 2016-01-14 NOTE — Progress Notes (Signed)
Subjective:    I'm seeing this patient as a consultation for:  [name]  CC: Bilateral shoulder pain  HPI: This is a pleasant 70 year old female, for years she's had pain in both shoulders, predominantly localized over the deltoid, and worse with most motions. She did have an MRI sometime ago that showed rotator cuff tendinosis, glenohumeral osteoarthritis, as well as bicipital tendinosis. She is desiring injection treatment today, she has had formal physical therapy that simply worsened her symptoms.  Past medical history:  Negative.  See flowsheet/record as well for more information.  Surgical history: Negative.  See flowsheet/record as well for more information.  Family history: Negative.  See flowsheet/record as well for more information.  Social history: Negative.  See flowsheet/record as well for more information.  Allergies, and medications have been entered into the medical record, reviewed, and no changes needed.   Review of Systems: No headache, visual changes, nausea, vomiting, diarrhea, constipation, dizziness, abdominal pain, skin rash, fevers, chills, night sweats, weight loss, swollen lymph nodes, body aches, joint swelling, muscle aches, chest pain, shortness of breath, mood changes, visual or auditory hallucinations.   Objective:   General: Well Developed, well nourished, and in no acute distress.  Neuro/Psych: Alert and oriented x3, extra-ocular muscles intact, able to move all 4 extremities, sensation grossly intact. Skin: Warm and dry, no rashes noted.  Respiratory: Not using accessory muscles, speaking in full sentences, trachea midline.  Cardiovascular: Pulses palpable, no extremity edema. Abdomen: Does not appear distended. Right Shoulder: Inspection reveals no abnormalities, atrophy or asymmetry. Palpation is normal with no tenderness over AC joint or bicipital groove. Range of motion limited by pain, rotator cuff strength weak in all  directions Positive Neer and Hawkin's tests, empty can. Speeds and Yergason's tests normal. Positive pain with external rotation Normal scapular function observed. No painful arc and no drop arm sign. No apprehension sign  Procedure: Real-time Ultrasound Guided Injection of right glenohumeral joint Device: GE Logiq E  Verbal informed consent obtained.  Time-out conducted.  Noted no overlying erythema, induration, or other signs of local infection.  Skin prepped in a sterile fashion.  Local anesthesia: Topical Ethyl chloride.  With sterile technique and under real time ultrasound guidance:  Spinal needle advanced into joint and 1 mL kenalog 40, 2 mL lidocaine, 2 mL Marcaine injected easily Completed without difficulty  Pain immediately resolved suggesting accurate placement of the medication.  Advised to call if fevers/chills, erythema, induration, drainage, or persistent bleeding.  Images permanently stored and available for review in the ultrasound unit.  Impression: Technically successful ultrasound guided injection.  Procedure: Real-time Ultrasound Guided Injection of right subacromial bursa Device: GE Logiq E  Verbal informed consent obtained.  Time-out conducted.  Noted no overlying erythema, induration, or other signs of local infection.  Skin prepped in a sterile fashion.  Local anesthesia: Topical Ethyl chloride.  With sterile technique and under real time ultrasound guidance:  25-gauge needle advanced into the circumflex real bursa and 1 mL kenalog 40, 1 mL lidocaine, 1 mL Marcaine injected easily Completed without difficulty  Pain immediately resolved suggesting accurate placement of the medication.  Advised to call if fevers/chills, erythema, induration, drainage, or persistent bleeding.  Images permanently stored and available for review in the ultrasound unit.  Impression: Technically successful ultrasound guided injection.  Impression and Recommendations:   This  case required medical decision making of moderate complexity.  Injury of right rotator cuff Multifactorial shoulder pain including glenohumeral osteoarthritis, rotator cuff tendinosis, biceps  tendinosis. Glenohumeral and subacromial injections today. Return in one month, we will consider doing the left side if she has a good response.

## 2016-01-18 ENCOUNTER — Other Ambulatory Visit: Payer: Self-pay | Admitting: Family Medicine

## 2016-01-18 DIAGNOSIS — Z1231 Encounter for screening mammogram for malignant neoplasm of breast: Secondary | ICD-10-CM

## 2016-01-21 ENCOUNTER — Other Ambulatory Visit: Payer: Self-pay | Admitting: Family Medicine

## 2016-01-22 ENCOUNTER — Other Ambulatory Visit: Payer: Self-pay

## 2016-01-22 MED ORDER — HYDROCHLOROTHIAZIDE 12.5 MG PO TABS: 12.5000 mg | ORAL_TABLET | ORAL | 0 refills | Status: DC | PRN

## 2016-02-08 ENCOUNTER — Other Ambulatory Visit: Payer: Self-pay | Admitting: *Deleted

## 2016-02-08 MED ORDER — METOPROLOL SUCCINATE ER 100 MG PO TB24: 100.0000 mg | ORAL_TABLET | Freq: Every day | ORAL | 10 refills | Status: DC

## 2016-02-08 MED ORDER — DILTIAZEM HCL ER COATED BEADS 120 MG PO CP24: 120.0000 mg | ORAL_CAPSULE | Freq: Every day | ORAL | 10 refills | Status: DC

## 2016-02-08 NOTE — Telephone Encounter (Signed)
Patient requested only a quantity of thirty as she states that it will be cheaper than ninety as it is the end of the year.

## 2016-02-10 NOTE — Progress Notes (Signed)
Subjective:    Patient ID: Martha Perkins, female    DOB: Jul 11, 1945, 70 y.o.   MRN: [id number]  HPI Pt returns for f/u of hyperthyroidism (dx'ed April, 2017, in w/u of AF; she had partial right thyroid lobectomy in 1970; she was rx'ed tapazole and toprol, due to the need for prompt improvement; she had RAI in July of 2017; due to AF, she resumed tapazole after RAI; in Sept of 2017, tapazole was d/c'ed, due to hypothyroidism, and synthroid was started; in Oct of 2017, TSH was stopped due to suppressed TSH).  pt reports ongoing palpitations, worse at night.  Past Medical History:  Diagnosis Date  . Depression   . Hyperlipidemia   . Hypertension   . Hyperthyroidism 08/09/2015  . Kidney stone   . Paroxysmal atrial fibrillation ([place]) 08/09/2015  . Thyroid disease     Past Surgical History:  Procedure Laterality Date  . CATARACT EXTRACTION Left 09/2012   Dr. Bunnie Philips  . LITHOTRIPSY    . REPLACEMENT TOTAL KNEE BILATERAL  2011  . THYROIDECTOMY  1971  . TUBAL LIGATION  1979    Social History   Social History  . Marital status: Married    Spouse name: N/A  . Number of children: N/A  . Years of education: N/A   Occupational History  . Retired Therapist, sports.     Social History Main Topics  . Smoking status: Never Smoker  . Smokeless tobacco: Not on file  . Alcohol use No  . Drug use: No  . Sexual activity: Not on file   Other Topics Concern  . Not on file   Social History Narrative  . No narrative on file    Current Outpatient Prescriptions on File Prior to Visit  Medication Sig Dispense Refill  . albuterol (PROAIR HFA) 108 (90 Base) MCG/ACT inhaler Inhale 2 puffs into the lungs every 6 (six) hours as needed for wheezing or shortness of breath. 18 each 3  . atorvastatin (LIPITOR) 40 MG tablet TAKE 1 TABLET BY MOUTH  DAILY 90 tablet 3  . diltiazem (CARDIZEM CD) 120 MG 24 hr capsule Take 1 capsule (120 mg total) by mouth daily. 30 capsule 10  . ELIQUIS 5 MG TABS tablet TAKE ONE TABLET  BY MOUTH TWICE DAILY 60 tablet 5  . esomeprazole (NEXIUM) 40 MG capsule Take 1 capsule (40 mg total) by mouth daily. 90 capsule 0  . fluticasone (FLONASE) 50 MCG/ACT nasal spray Place 1 spray into both nostrils 2 (two) times daily. 48 g 1  . hydrochlorothiazide (HYDRODIURIL) 12.5 MG tablet Take 1 tablet (12.5 mg total) by mouth as needed (for swelling). 90 tablet 0  . ketoconazole (NIZORAL) 2 % cream Apply topically daily as needed for irritation. 30 g 1  . metoprolol succinate (TOPROL-XL) 100 MG 24 hr tablet Take 1 tablet (100 mg total) by mouth daily. 30 tablet 10  . traMADol (ULTRAM) 50 MG tablet Take 1 tablet (50 mg total) by mouth 3 (three) times daily. 270 tablet 0   No current facility-administered medications on file prior to visit.     Allergies  Allergen Reactions  . Demerol Nausea Only  . Meperidine Hcl   . Oxycodone-Aspirin   . Percocet [Oxycodone-Acetaminophen]     Family History  Problem Relation Age of Onset  . Coronary artery disease Mother 17    deceased  . Depression Mother   . Diabetes Paternal Grandmother   . Hyperlipidemia Mother   . Thyroid disease Sister  BP 130/86   Pulse 92   Wt 184 lb 12.8 oz (83.8 kg)   SpO2 98%   BMI 33.80 kg/m   Review of Systems She has hair loss.     Objective:   Physical Exam VITAL SIGNS:  See vs page.  GENERAL: no distress.  NECK: a healed scar is present.  left thyroid mass is again noted.        Assessment & Plan:  Post-RAI hypothyroidism: due for recheck Patient is advised the following: Patient Instructions  blood tests are requested for you today.  We'll let you know about the results.  Please come back for a follow-up appointment in 6 weeks.

## 2016-02-11 ENCOUNTER — Encounter: Payer: Self-pay | Admitting: Endocrinology

## 2016-02-11 ENCOUNTER — Ambulatory Visit (INDEPENDENT_AMBULATORY_CARE_PROVIDER_SITE_OTHER): Payer: Medicare Other | Admitting: Endocrinology

## 2016-02-11 VITALS — BP 130/86 | HR 92 | Wt 184.8 lb

## 2016-02-11 DIAGNOSIS — E890 Postprocedural hypothyroidism: Secondary | ICD-10-CM

## 2016-02-11 LAB — T4, FREE: Free T4: 1.89 ng/dL — ABNORMAL HIGH (ref 0.60–1.60)

## 2016-02-11 LAB — TSH: TSH: 0.07 u[IU]/mL — ABNORMAL LOW (ref 0.35–4.50)

## 2016-02-11 NOTE — Patient Instructions (Signed)
blood tests are requested for you today.  We'll let you know about the results.  Please come back for a follow-up appointment in 6 weeks.  

## 2016-02-13 ENCOUNTER — Telehealth: Payer: Self-pay | Admitting: Endocrinology

## 2016-02-13 NOTE — Telephone Encounter (Signed)
I contacted the patient and advised of results from 02/11/2016.

## 2016-02-13 NOTE — Telephone Encounter (Signed)
    Patient calling for lab results 

## 2016-02-14 ENCOUNTER — Ambulatory Visit (INDEPENDENT_AMBULATORY_CARE_PROVIDER_SITE_OTHER): Payer: Medicare Other | Admitting: Sports Medicine

## 2016-02-14 DIAGNOSIS — M19012 Primary osteoarthritis, left shoulder: Secondary | ICD-10-CM

## 2016-02-14 DIAGNOSIS — M25512 Pain in left shoulder: Secondary | ICD-10-CM

## 2016-02-14 DIAGNOSIS — S46001D Unspecified injury of muscle(s) and tendon(s) of the rotator cuff of right shoulder, subsequent encounter: Secondary | ICD-10-CM

## 2016-02-14 DIAGNOSIS — G8929 Other chronic pain: Secondary | ICD-10-CM

## 2016-02-14 DIAGNOSIS — M19011 Primary osteoarthritis, right shoulder: Secondary | ICD-10-CM | POA: Insufficient documentation

## 2016-02-14 NOTE — Assessment & Plan Note (Signed)
Fantastic response to right subacromial and glenohumeral injections, repeating this on the left side. Return as needed.

## 2016-02-14 NOTE — Progress Notes (Signed)
  Subjective:    CC: Left shoulder pain  HPI: This is a pleasant 70 year old female, she responded favorably to a right subacromial and glenohumeral injection at the last visit, currently pain-free, having a similar pain on the left side and would like me to repeat the injections on the side. Moderate, persistent, localized over the deltoid as well as the joint line, worse with overhead activities, abduction, and external rotation.  Past medical history:  Negative.  See flowsheet/record as well for more information.  Surgical history: Negative.  See flowsheet/record as well for more information.  Family history: Negative.  See flowsheet/record as well for more information.  Social history: Negative.  See flowsheet/record as well for more information.  Allergies, and medications have been entered into the medical record, reviewed, and no changes needed.   Review of Systems: No fevers, chills, night sweats, weight loss, chest pain, or shortness of breath.   Objective:    General: Well Developed, well nourished, and in no acute distress.  Neuro: Alert and oriented x3, extra-ocular muscles intact, sensation grossly intact.  HEENT: Normocephalic, atraumatic, pupils equal round reactive to light, neck supple, no masses, no lymphadenopathy, thyroid nonpalpable.  Skin: Warm and dry, no rashes. Cardiac: Regular rate and rhythm, no murmurs rubs or gallops, no lower extremity edema.  Respiratory: Clear to auscultation bilaterally. Not using accessory muscles, speaking in full sentences. Left Shoulder: Inspection reveals no abnormalities, atrophy or asymmetry. Palpation is normal with no tenderness over AC joint or bicipital groove. ROM is full in all planes. Rotator cuff strength normal throughout. Positive Neer and Hawkin's tests, empty can. Speeds and Yergason's tests normal. Positive Obrien's, negative crank, negative clunk, and good stability. Normal scapular function observed. No painful  arc and no drop arm sign. No apprehension sign  Procedure: Real-time Ultrasound Guided Injection of left subacromial bursa Device: GE Logiq E  Verbal informed consent obtained.  Time-out conducted.  Noted no overlying erythema, induration, or other signs of local infection.  Skin prepped in a sterile fashion.  Local anesthesia: Topical Ethyl chloride.  With sterile technique and under real time ultrasound guidance:  1 mL kenalog 40, 1 mL Marcaine, 1 mL lidocaine injected easily Completed without difficulty  Pain immediately resolved suggesting accurate placement of the medication.  Advised to call if fevers/chills, erythema, induration, drainage, or persistent bleeding.  Images permanently stored and available for review in the ultrasound unit.  Impression: Technically successful ultrasound guided injection.  Procedure: Real-time Ultrasound Guided Injection of left glenohumeral joint Device: GE Logiq E  Verbal informed consent obtained.  Time-out conducted.  Noted no overlying erythema, induration, or other signs of local infection.  Skin prepped in a sterile fashion.  Local anesthesia: Topical Ethyl chloride.  With sterile technique and under real time ultrasound guidance:  Advanced a 22-gauge spinal needle into the glenohumeral joint care to avoid the labrum, I then injected 1 mL kenalog 40, 2 mL lidocaine, 2 mL Marcaine. Completed without difficulty  Pain immediately resolved suggesting accurate placement of the medication.  Advised to call if fevers/chills, erythema, induration, drainage, or persistent bleeding.  Images permanently stored and available for review in the ultrasound unit.  Impression: Technically successful ultrasound guided injection.  Impression and Recommendations:    Left shoulder pain Fantastic response to right subacromial and glenohumeral injections, repeating this on the left side. Return as needed.  Injury of right rotator cuff Resolved after  injections

## 2016-02-14 NOTE — Assessment & Plan Note (Signed)
Resolved after injections

## 2016-02-18 ENCOUNTER — Other Ambulatory Visit: Payer: Self-pay | Admitting: Endocrinology

## 2016-02-18 ENCOUNTER — Encounter: Payer: Self-pay | Admitting: Endocrinology

## 2016-02-18 DIAGNOSIS — E0590 Thyrotoxicosis, unspecified without thyrotoxic crisis or storm: Secondary | ICD-10-CM

## 2016-02-18 NOTE — Telephone Encounter (Signed)
MD notified  

## 2016-02-18 NOTE — Telephone Encounter (Signed)
Pt is aware of the note from [place], she is ok with scheduling

## 2016-02-25 ENCOUNTER — Encounter: Payer: Self-pay | Admitting: Cardiovascular Disease

## 2016-02-25 ENCOUNTER — Ambulatory Visit (INDEPENDENT_AMBULATORY_CARE_PROVIDER_SITE_OTHER): Payer: Medicare Other | Admitting: Cardiovascular Disease

## 2016-02-25 ENCOUNTER — Other Ambulatory Visit: Payer: Self-pay | Admitting: Family Medicine

## 2016-02-25 ENCOUNTER — Other Ambulatory Visit: Payer: Self-pay

## 2016-02-25 VITALS — BP 132/89 | HR 78 | Ht 62.0 in | Wt 186.4 lb

## 2016-02-25 DIAGNOSIS — I4811 Longstanding persistent atrial fibrillation: Secondary | ICD-10-CM

## 2016-02-25 DIAGNOSIS — I481 Persistent atrial fibrillation: Secondary | ICD-10-CM | POA: Diagnosis not present

## 2016-02-25 DIAGNOSIS — E7800 Pure hypercholesterolemia, unspecified: Secondary | ICD-10-CM

## 2016-02-25 DIAGNOSIS — I10 Essential (primary) hypertension: Secondary | ICD-10-CM | POA: Diagnosis not present

## 2016-02-25 DIAGNOSIS — E2839 Other primary ovarian failure: Secondary | ICD-10-CM

## 2016-02-25 NOTE — Patient Instructions (Addendum)
Medication Instructions:  START HYDROCHLOROTHIAZIDE 12.5 mg 1/2 tablet daily  Labwork: none  Testing/Procedures: none  Follow-Up: Your physician recommends that you schedule a follow-up appointment in: 1 month ov with NP/PA  Your physician recommends that you schedule a follow-up appointment in: 3 month ov with Dr Oval Linsey   If you need a refill on your cardiac medications before your next appointment, please call your pharmacy.

## 2016-02-25 NOTE — Progress Notes (Signed)
Cardiology Office Note   Date:  02/25/2016   ID:  Myli Vanpool, DOB [birth year], MRN [id number]  PCP:  Beatrice Lecher, MD  Cardiologist:   Skeet Latch, MD   Chief Complaint  Patient presents with  . Follow-up     History of Present Illness: Martha Perkins is a 70 y.o. female with paroxysmal atrial fibrillation, Grave's disease, hypertension, hyperlipidemia, and fatty liver disease who presents follow up on atrial fibrillation.  Martha Perkins was seen by her PCP, [name], on 07/13/15 for newly-diagnosed atrial fibrillation.  She was started on Eliquis and a low-dose beta blocker. Lab work revealed that she was hyperthyroid.  She had a thyroid uptake scan that revealed an enlarged, hyperfunctioning left lobe of the thyroid gland without discrete nodule. Findings were thought to be consistent with Graves' disease. She also underwent radioactive iodine therapy. She was seen in clinic 08/09/15 and metoprolol was increased.  Lisinopril was discontinued. She had an echo 08/2015 that revealed LVEF 50-55% with mild MR and TR.  Martha Perkins saw Roderic Palau, NP in the atrial fibrillation clinic on 10/29/15.  At that time her ventricular rate was better controlled.  There was a plan to pursue DCCV once her thyroid is better controlled.    Since her last appointment Martha Perkins has been well. She continues to notice some atrial fibrillation at night when she goes to bed.  She also notes mild lower extremity edema and orthopnea.  She takes HCTZ as needed but hasn't been taking it lately.  She continues to work with Dr. Loanne Drilling to get her thyroid regulated.  She is disappointed that she may need to take radioactive iodine again.    Past Medical History:  Diagnosis Date  . Depression   . Hyperlipidemia   . Hypertension   . Hyperthyroidism 08/09/2015  . Kidney stone   . Paroxysmal atrial fibrillation ([place]) 08/09/2015  . Thyroid disease     Past Surgical History:  Procedure Laterality Date    . CATARACT EXTRACTION Left 09/2012   Dr. Bunnie Philips  . LITHOTRIPSY    . REPLACEMENT TOTAL KNEE BILATERAL  2011  . THYROIDECTOMY  1971  . TUBAL LIGATION  1979     Current Outpatient Prescriptions  Medication Sig Dispense Refill  . albuterol (PROAIR HFA) 108 (90 Base) MCG/ACT inhaler Inhale 2 puffs into the lungs every 6 (six) hours as needed for wheezing or shortness of breath. 18 each 3  . atorvastatin (LIPITOR) 40 MG tablet TAKE 1 TABLET BY MOUTH  DAILY 90 tablet 3  . diltiazem (CARDIZEM CD) 120 MG 24 hr capsule Take 120 mg by mouth every evening.    Marland Kitchen ELIQUIS 5 MG TABS tablet TAKE ONE TABLET BY MOUTH TWICE DAILY 60 tablet 5  . esomeprazole (NEXIUM) 40 MG capsule Take 1 capsule (40 mg total) by mouth daily. 90 capsule 0  . fluticasone (FLONASE) 50 MCG/ACT nasal spray Place 1 spray into both nostrils 2 (two) times daily. 48 g 1  . hydrochlorothiazide (HYDRODIURIL) 12.5 MG tablet Take 12.5 mg by mouth as directed. 1/2 TABLET BY MOUTH DAILY    . ketoconazole (NIZORAL) 2 % cream Apply topically daily as needed for irritation. 30 g 1  . metoprolol succinate (TOPROL-XL) 100 MG 24 hr tablet Take 100 mg by mouth every evening. Take with or immediately following a meal.    . traMADol (ULTRAM) 50 MG tablet Take 1 tablet (50 mg total) by mouth 3 (three) times daily. 270 tablet 0  No current facility-administered medications for this visit.     Allergies:   Demerol; Meperidine hcl; Oxycodone-aspirin; and Percocet [oxycodone-acetaminophen]    Social History:  The patient  reports that she has never smoked. She does not have any smokeless tobacco history on file. She reports that she does not drink alcohol or use drugs.   Family History:  The patient's family history includes Coronary artery disease (age of onset: 5) in her mother; Depression in her mother; Diabetes in her paternal grandmother; Hyperlipidemia in her mother; Thyroid disease in her sister.    ROS:  Please see the history of  present illness.   Otherwise, review of systems are positive for none.   All other systems are reviewed and negative.    PHYSICAL EXAM: VS:  BP 132/89 (BP Location: Right Wrist, Patient Position: Sitting, Cuff Size: Normal)   Pulse 78   Ht 5\' 2"  (1.575 m)   Wt 84.6 kg (186 lb 6.4 oz)   SpO2 96%   BMI 34.09 kg/m  , BMI Body mass index is 34.09 kg/m. GENERAL:  Well appearing HEENT:  Pupils equal round and reactive, fundi not visualized, oral mucosa unremarkable NECK:  No jugular venous distention, waveform within normal limits, carotid upstroke brisk and symmetric, no bruits LYMPHATICS:  No cervical adenopathy LUNGS:  Clear to auscultation bilaterally HEART:  Irregularly irregular. PMI not displaced or sustained,S1 and S2 within normal limits, no S3, no S4, no clicks, no rubs, I/VI systolic murmur at the LUSB.  ABD:  Flat, positive bowel sounds normal in frequency in pitch, no bruits, no rebound, no guarding, no midline pulsatile mass, no hepatomegaly, no splenomegaly EXT:  2 plus pulses throughout, no edema, no cyanosis no clubbing SKIN:  No rashes no nodules NEURO:  Cranial nerves II through XII grossly intact, motor grossly intact throughout PSYCH:  Cognitively intact, oriented to person place and time   EKG:  EKG is ordered today. The ekg ordered 12/11/15 demonstatrial fibrillation. Rate 64 bpm. PVC. Prior septal infarct.  Right axis deviation.  Recent Labs: 10/18/2015: ALT 19; BUN 14; Creat 0.50; Potassium 4.0; Sodium 141 12/10/2015: Hemoglobin 15.2; Platelets 216 02/11/2016: TSH 0.07    Lipid Panel    Component Value Date/Time   CHOL 124 (L) 07/02/2015 0959   TRIG 170 (H) 07/02/2015 0959   HDL 31 (L) 07/02/2015 0959   CHOLHDL 4.0 07/02/2015 0959   VLDL 34 (H) 07/02/2015 0959   LDLCALC 59 07/02/2015 0959      Wt Readings from Last 3 Encounters:  02/25/16 84.6 kg (186 lb 6.4 oz)  02/14/16 84.4 kg (186 lb)  02/11/16 83.8 kg (184 lb 12.8 oz)      ASSESSMENT AND  PLAN:  # Atrial fibrillation: Martha Perkins has atrial fibrillation that is likely attributable to her hyperthyroidism.  She continues to have palpitations mostly at night. I have asked her to switch her metoprolol and diltiazem to the evening. She is  asymptomatic throughout the day.  We will plan to pursue DC cardioversion once she is euthyroid.  Continue Eliquis for anticoagulation. If she maintains sinus rhythm after cardioversion she may not need lifelong anticoagulation.  This patients CHA2DS2-VASc Score and unadjusted Ischemic Stroke Rate (% per year) is equal to 3.2 % stroke rate/year from a score of 3  Above score calculated as 1 point each if present [CHF, HTN, DM, Vascular=MI/PAD/Aortic Plaque, Age if 65-74, or Female] Above score calculated as 2 points each if present [Age > 75, or Stroke/TIA/TE]  #  Hypertension:  BP is at the upper limit of normal. We will add HCTZ 6.25mg  bid.  Continue metoprolol and diltiazem .   # Hyperlipidemia: Continue atorvastatin 40mg  daily.  LDL 59 06/2015.   Current medicines are reviewed at length with the patient today.  The patient does not have concerns regarding medicines.  The following changes have been made: Start HCTZ 6.25mg  daily.  Labs/ tests ordered today include:   No orders of the defined types were placed in this encounter.    Disposition:   FU with Rogene Meth C. Oval Linsey, MD, [facility] in 1 month.   This note was written with the assistance of speech recognition software.  Please excuse any transcriptional errors.  Signed, Jozi Malachi C. Oval Linsey, MD, [facility]  02/25/2016 10:29 AM    [facility]

## 2016-02-26 ENCOUNTER — Ambulatory Visit
Admission: RE | Admit: 2016-02-26 | Discharge: 2016-02-26 | Disposition: A | Discharge status: Home / Self Care | Payer: Medicare Other | Source: Ambulatory Visit | Attending: Family Medicine | Admitting: Family Medicine

## 2016-02-26 ENCOUNTER — Inpatient Hospital Stay: Admission: RE | Admit: 2016-02-26 | Payer: Self-pay | Source: Ambulatory Visit

## 2016-02-26 DIAGNOSIS — M85851 Other specified disorders of bone density and structure, right thigh: Secondary | ICD-10-CM | POA: Diagnosis not present

## 2016-02-26 DIAGNOSIS — E2839 Other primary ovarian failure: Secondary | ICD-10-CM

## 2016-02-26 DIAGNOSIS — Z780 Asymptomatic menopausal state: Secondary | ICD-10-CM | POA: Diagnosis not present

## 2016-02-26 DIAGNOSIS — Z1231 Encounter for screening mammogram for malignant neoplasm of breast: Secondary | ICD-10-CM | POA: Diagnosis not present

## 2016-03-05 ENCOUNTER — Encounter (HOSPITAL_COMMUNITY)
Admission: RE | Admit: 2016-03-05 | Discharge: 2016-03-05 | Disposition: A | Discharge status: Home / Self Care | Payer: Medicare Other | Source: Ambulatory Visit | Attending: Endocrinology | Admitting: Endocrinology

## 2016-03-05 DIAGNOSIS — E0590 Thyrotoxicosis, unspecified without thyrotoxic crisis or storm: Secondary | ICD-10-CM | POA: Insufficient documentation

## 2016-03-05 MED ORDER — SODIUM IODIDE I 131 CAPSULE
6.8800 | Freq: Once | INTRAVENOUS | Status: AC | PRN
  Administered 2016-03-05: 6.88 via ORAL

## 2016-03-06 ENCOUNTER — Encounter (HOSPITAL_COMMUNITY)
Admission: RE | Admit: 2016-03-06 | Discharge: 2016-03-06 | Disposition: A | Discharge status: Home / Self Care | Payer: Medicare Other | Source: Ambulatory Visit | Attending: Endocrinology | Admitting: Endocrinology

## 2016-03-06 ENCOUNTER — Other Ambulatory Visit: Payer: Self-pay | Admitting: Endocrinology

## 2016-03-06 DIAGNOSIS — E0590 Thyrotoxicosis, unspecified without thyrotoxic crisis or storm: Secondary | ICD-10-CM

## 2016-03-06 MED ORDER — SODIUM PERTECHNETATE TC 99M INJECTION: 10.0000 | Freq: Once | INTRAVENOUS | Status: DC | PRN

## 2016-03-11 ENCOUNTER — Other Ambulatory Visit: Payer: Self-pay | Admitting: Cardiovascular Disease

## 2016-03-11 NOTE — Telephone Encounter (Signed)
Please review for refill.  

## 2016-03-11 NOTE — Telephone Encounter (Signed)
Rx has been sent to the pharmacy electronically. ° °

## 2016-03-13 ENCOUNTER — Encounter: Payer: Self-pay | Admitting: Family Medicine

## 2016-03-13 ENCOUNTER — Ambulatory Visit (INDEPENDENT_AMBULATORY_CARE_PROVIDER_SITE_OTHER): Payer: Medicare Other | Admitting: Family Medicine

## 2016-03-13 VITALS — BP 100/78 | HR 54 | Ht 62.0 in | Wt 186.0 lb

## 2016-03-13 DIAGNOSIS — J0110 Acute frontal sinusitis, unspecified: Secondary | ICD-10-CM | POA: Diagnosis not present

## 2016-03-13 MED ORDER — AMOXICILLIN-POT CLAVULANATE 875-125 MG PO TABS: 1.0000 | ORAL_TABLET | Freq: Two times a day (BID) | ORAL | 0 refills | Status: DC

## 2016-03-13 NOTE — Progress Notes (Signed)
   Subjective:    Patient ID: Martha Perkins, female    DOB: [birth year], 71 y.o.   MRN: [id number]  HPI Sinus congestion and pressure began over the weekend ( x 5 days) she stated that she has been experiencing headaches, bilateral ear pain, facial pain, she has been blowing out green mucus. she's been taking sudafen and affrin nasal spray - these do help. denies fever,sweats,or chills.  No diarrhea. No significant chest symptoms. She has had some facial pressure particularly over the right frontal area. She also complains of significant postnasal drip.  Review of Systems     Objective:   Physical Exam  Constitutional: She is oriented to person, place, and time. She appears well-developed and well-nourished.  HENT:  Head: Normocephalic and atraumatic.  Right Ear: External ear normal.  Left Ear: External ear normal.  Nose: Nose normal.  Mouth/Throat: Oropharynx is clear and moist.  TMs and canals are clear.   Eyes: Conjunctivae and EOM are normal. Pupils are equal, round, and reactive to light.  Neck: Neck supple. No thyromegaly present.  Cardiovascular: Normal rate, regular rhythm and normal heart sounds.   Pulmonary/Chest: Effort normal and breath sounds normal. She has no wheezes.  Lymphadenopathy:    She has no cervical adenopathy.  Neurological: She is alert and oriented to person, place, and time.  Skin: Skin is warm and dry.  Psychiatric: She has a normal mood and affect.        Assessment & Plan:  Acute sinusitis-still a little bit on the early side. I did discuss with her getting a few more days. If she's not feeling better at that time or she suddenly feels worse than okay to fill prescription for Augmentin. Call if not better in 1-2 weeks. Warned her about overuse of Afrin.

## 2016-03-13 NOTE — Patient Instructions (Addendum)

## 2016-03-14 ENCOUNTER — Ambulatory Visit: Payer: Self-pay | Admitting: Family Medicine

## 2016-03-28 ENCOUNTER — Encounter: Payer: Self-pay | Admitting: Physician Assistant

## 2016-03-28 ENCOUNTER — Telehealth: Payer: Self-pay | Admitting: Cardiovascular Disease

## 2016-03-28 ENCOUNTER — Ambulatory Visit (INDEPENDENT_AMBULATORY_CARE_PROVIDER_SITE_OTHER): Payer: Medicare Other | Admitting: Physician Assistant

## 2016-03-28 VITALS — BP 116/80 | HR 84 | Ht 62.0 in | Wt 187.5 lb

## 2016-03-28 DIAGNOSIS — Z79899 Other long term (current) drug therapy: Secondary | ICD-10-CM | POA: Diagnosis not present

## 2016-03-28 DIAGNOSIS — I481 Persistent atrial fibrillation: Secondary | ICD-10-CM | POA: Diagnosis not present

## 2016-03-28 DIAGNOSIS — E785 Hyperlipidemia, unspecified: Secondary | ICD-10-CM | POA: Diagnosis not present

## 2016-03-28 DIAGNOSIS — I10 Essential (primary) hypertension: Secondary | ICD-10-CM

## 2016-03-28 DIAGNOSIS — R600 Localized edema: Secondary | ICD-10-CM

## 2016-03-28 DIAGNOSIS — I4819 Other persistent atrial fibrillation: Secondary | ICD-10-CM

## 2016-03-28 DIAGNOSIS — E0500 Thyrotoxicosis with diffuse goiter without thyrotoxic crisis or storm: Secondary | ICD-10-CM

## 2016-03-28 NOTE — Telephone Encounter (Signed)
Acknowledged.  No refill sent at this time.

## 2016-03-28 NOTE — Progress Notes (Signed)
Cardiology Office Note    Date:  03/28/2016   ID:  Cagney Falso, DOB 1945/06/10, MRN [id number]  PCP:  Beatrice Lecher, MD  Cardiologist:  Dr. Oval Linsey Endocrinologist: Dr. Loanne Drilling  Chief Complaint  Patient presents with  . Follow-up    seen for Dr. Oval Linsey, persistent afib in the setting of hyperthyroidism    History of Present Illness:  Martha Perkins is a 71 y.o. female with PMH of persistent atrial fibrillation, Graves' disease, hypertension, hyperlipidemia, and fatty liver disease. She was diagnosed with new atrial fibrillation on 07/13/2015. She was started on eliquis and low-dose beta blocker. Lab work revealed she was hyperthyroid. She had a history of right thyroidectomy in 1970s. She had a thyroid uptake scan that revealed enlarged hyper-functioning left lobe of the thyroid gland without discrete nodule. Finding was thought to be consistent with Graves' disease. She also underwent radioactive iodine therapy. She was seen in the clinic in June 2017 and metoprolol was increased and lisinopril was discontinued. She had a echocardiogram also in June 2017 revealed EF 50-55%, mild MR and TR. She was seen in the atrial fibrillation clinic in August and her ventricular rate was better controlled. The plan was to pursue cardioversion once her thyroid is better controlled. She was last seen by Dr. Loanne Drilling on 02/11/2016, however free T4 was still high at 1.89. She was last seen in the office on 02/25/2016, blood pressure was mildly elevated, hydrochlorothiazide 6.25 mg twice a day was added. She was having more palpitations at night, therefore she was instructed to take her Toprol-XL and diltiazem before sleep. She had another thyroid uptake scan on 03/06/2016 that showed decreased 24-hour radioactive iodine uptake.  Patient presents today for cardiology office follow-up. She is due for another radioactive iodine therapy on 04/07/2016. She continued to have at least 1+ pitting edema in bilateral  lower extremity. She has not taken hydrochlorothiazide as her home dose is very difficult to cut in half due to the small size of the pill. She is taking her Toprol-XL 100 mg and also diltiazem CD 120 mg at night. Instead of 6.25 twice a day of hydrochlorothiazide, I will change it to 12.5 mg daily every morning. In 1 week, she will obtain a repeat basic metabolic panel at her PCP's office to assess her renal function and electrolytes. She is scheduled for follow-up with Dr. Oval Linsey in March. Once her thyroid issues resolve, we plan for outpatient DC cardioversion. Note she does have moderately dilated left atrium, therefore she will have slightly higher chance of recurrence of atrial fibrillation. However this will need to be reassessed after she has a cardioversion.    Past Medical History:  Diagnosis Date  . Depression   . Hyperlipidemia   . Hypertension   . Hyperthyroidism 08/09/2015  . Kidney stone   . Paroxysmal atrial fibrillation ([place]) 08/09/2015  . Thyroid disease     Past Surgical History:  Procedure Laterality Date  . CATARACT EXTRACTION Left 09/2012   Dr. Bunnie Philips  . LITHOTRIPSY    . REPLACEMENT TOTAL KNEE BILATERAL  2011  . THYROIDECTOMY  1971  . TUBAL LIGATION  1979    Current Medications: Outpatient Medications Prior to Visit  Medication Sig Dispense Refill  . albuterol (PROAIR HFA) 108 (90 Base) MCG/ACT inhaler Inhale 2 puffs into the lungs every 6 (six) hours as needed for wheezing or shortness of breath. 18 each 3  . atorvastatin (LIPITOR) 40 MG tablet TAKE 1 TABLET BY MOUTH  DAILY 90 tablet  3  . diltiazem (CARDIZEM CD) 120 MG 24 hr capsule Take 120 mg by mouth every evening.    Marland Kitchen ELIQUIS 5 MG TABS tablet TAKE ONE TABLET BY MOUTH TWICE DAILY 60 tablet 5  . esomeprazole (NEXIUM) 40 MG capsule Take 1 capsule (40 mg total) by mouth daily. 90 capsule 0  . fluticasone (FLONASE) 50 MCG/ACT nasal spray Place 1 spray into both nostrils 2 (two) times daily. 48 g 1  .  hydrochlorothiazide (HYDRODIURIL) 12.5 MG tablet Take 0.5 tablets (6.25 mg total) by mouth daily. 45 tablet 2  . ketoconazole (NIZORAL) 2 % cream Apply topically daily as needed for irritation. 30 g 1  . metoprolol succinate (TOPROL-XL) 100 MG 24 hr tablet Take 100 mg by mouth every evening. Take with or immediately following a meal.    . traMADol (ULTRAM) 50 MG tablet Take 1 tablet (50 mg total) by mouth 3 (three) times daily. 270 tablet 0  . amoxicillin-clavulanate (AUGMENTIN) 875-125 MG tablet Take 1 tablet by mouth 2 (two) times daily. (Patient not taking: Reported on 03/28/2016) 20 tablet 0   No facility-administered medications prior to visit.      Allergies:   Demerol; Meperidine hcl; Oxycodone-aspirin; and Percocet [oxycodone-acetaminophen]   Social History   Social History  . Marital status: Married    Spouse name: N/A  . Number of children: N/A  . Years of education: N/A   Occupational History  . Retired Therapist, sports.     Social History Main Topics  . Smoking status: Never Smoker  . Smokeless tobacco: Never Used  . Alcohol use No  . Drug use: No  . Sexual activity: Not Asked   Other Topics Concern  . None   Social History Narrative  . None     Family History:  The patient's family history includes Coronary artery disease (age of onset: 30) in her mother; Depression in her mother; Diabetes in her paternal grandmother; Hyperlipidemia in her mother; Thyroid disease in her sister.   ROS:   Please see the history of present illness.    ROS All other systems reviewed and are negative.   PHYSICAL EXAM:   VS:  BP 116/80 (BP Location: Right Arm, Patient Position: Sitting, Cuff Size: Large)   Pulse 84   Ht 5\' 2"  (1.575 m)   Wt 187 lb 8 oz (85 kg)   BMI 34.29 kg/m    GEN: Well nourished, well developed, in no acute distress  HEENT: normal  Neck: no JVD, carotid bruits, or masses Cardiac: Irregularly irregular; no murmurs, rubs, or gallops. 1+ pitting edema  Respiratory:   clear to auscultation bilaterally, normal work of breathing GI: soft, nontender, nondistended, + BS MS: no deformity or atrophy  Skin: warm and dry, no rash Neuro:  Alert and Oriented x 3, Strength and sensation are intact Psych: euthymic mood, full affect  Wt Readings from Last 3 Encounters:  03/28/16 187 lb 8 oz (85 kg)  03/13/16 186 lb (84.4 kg)  02/25/16 186 lb 6.4 oz (84.6 kg)      Studies/Labs Reviewed:   EKG:  EKG is not ordered today.    Recent Labs: 10/18/2015: ALT 19; BUN 14; Creat 0.50; Potassium 4.0; Sodium 141 12/10/2015: Hemoglobin 15.2; Platelets 216 02/11/2016: TSH 0.07   Lipid Panel    Component Value Date/Time   CHOL 124 (L) 07/02/2015 0959   TRIG 170 (H) 07/02/2015 0959   HDL 31 (L) 07/02/2015 0959   CHOLHDL 4.0 07/02/2015 0959   VLDL 34 (  H) 07/02/2015 0959   LDLCALC 59 07/02/2015 0959    Additional studies/ records that were reviewed today include:   Echo 08/29/2015 LV EF: 50% -   55%  - Left ventricle: There was mild hypertrophy of the posterior wall.   Systolic function was normal. The estimated ejection fraction was   in the range of 50% to 55%. - Mitral valve: Calcified annulus. There was mild regurgitation. - Left atrium: The atrium was moderately dilated. - Right ventricle: The cavity size was normal. Wall thickness was   normal. Systolic function was normal. - Tricuspid valve: There was mild regurgitation.    ASSESSMENT:    1. Persistent atrial fibrillation ([place])   2. Medication management   3. Essential hypertension   4. Hyperlipidemia, unspecified hyperlipidemia type   5. Graves' disease   6. Lower extremity edema      PLAN:  In order of problems listed above:  1. Persistent atrial fibrillation on eliquis: No bleeding issues, heart rate very well controlled on nighttime dosage of 100 mg daily Toprol-XL and 120 mg daily of diltiazem CD.  - This patients CHA2DS2-VASc Score and unadjusted Ischemic Stroke Rate (% per year) is  equal to 3.2 % stroke rate/year from a score of 3  Above score calculated as 1 point each if present [CHF, HTN, DM, Vascular=MI/PAD/Aortic Plaque, Age if 65-74, or Female] Above score calculated as 2 points each if present [Age > 75, or Stroke/TIA/TE]  - Likely driven by the hyperthyroidism, she is undergoing another round of radiation iodine therapy on 04/07/2016. Last free T4 was still elevated at 1.9. Once her thyroid issues resolve, we plan for outpatient cardioversion. Note she does have moderately dilated left atrium, however will assess for recurrence of atrial fibrillation after cardioversion at a later time.   2. Graves' disease: Followed by Dr. Loanne Drilling, undergoing radiation iodine therapy  3. Lower extremity edema: No sounds heart failure, lungs clear on auscultation. She has not been taking her hydrochlorothiazide as she was unable to cut the pill in half, I have instructed her to take 12.5 mg daily of hydrochlorothiazide. She is unclear was the dosage of hydrochlorothiazide at home, she will check the dose once she gets home. She should take 12.5 mg daily every morning. She will need one week basic metabolic panel, however wish to obtain this at her PCPs office, she will fax to result to [place].  4. HTN: Blood pressure is normal, however given the persistent lower extremity edema, we will try a low-dose diuretic with hydrochlorothiazide.  5. HLD: Continue Lipitor 40 mg daily.    Medication Adjustments/Labs and Tests Ordered: Current medicines are reviewed at length with the patient today.  Concerns regarding medicines are outlined above.  Medication changes, Labs and Tests ordered today are listed in the Patient Instructions below. Patient Instructions  Medication Instructions:  START HYDROCHLOROTHIAZIDE (HCTZ) 12.5MG  DAILY  If you need a refill on your cardiac medications before your next appointment, please call your pharmacy.  Labwork: BMP IN ONE WEEK AFTER STARTING  HYDROCHLOROTHIAZIDE AT [facility] ON THE 1ST FLOOR  Follow-Up: Your physician recommends that you schedule a follow-up appointment in: AS SCHEDULED 05-29-2016 @ 1145AM   Thank you for choosing [facility], LPN Burdett Pinzon [facility], PA-C    Signed, Almyra Deforest, [place]  03/28/2016 12:09 PM    [address], [place], [place]  [id number] Phone: [phone]; Fax: [phone]

## 2016-03-28 NOTE — Patient Instructions (Signed)
Medication Instructions:  START HYDROCHLOROTHIAZIDE (HCTZ) 12.5MG  DAILY  If you need a refill on your cardiac medications before your next appointment, please call your pharmacy.  Labwork: BMP IN ONE WEEK AFTER STARTING HYDROCHLOROTHIAZIDE AT [facility] ON THE 1ST FLOOR  Follow-Up: Your physician recommends that you schedule a follow-up appointment in: AS SCHEDULED 05-29-2016 @ 1145AM   Thank you for choosing [facility]!!    Sharyn Lull, LPN [facility], PA-C

## 2016-03-28 NOTE — Telephone Encounter (Signed)
Martha Perkins is calling to let [name] know about a medication she already has so he does not have to refill it again the medication is HCTZ 12.5mg  and she has 90 days and does not need anymore at this time . Thanks

## 2016-03-28 NOTE — Telephone Encounter (Signed)
Thanks.  Sounds good. 

## 2016-04-04 ENCOUNTER — Encounter (HOSPITAL_COMMUNITY): Payer: Medicare Other

## 2016-04-07 ENCOUNTER — Encounter (HOSPITAL_COMMUNITY)
Admission: RE | Admit: 2016-04-07 | Discharge: 2016-04-07 | Disposition: A | Discharge status: Home / Self Care | Payer: Medicare Other | Source: Ambulatory Visit | Attending: Endocrinology | Admitting: Endocrinology

## 2016-04-07 DIAGNOSIS — E0590 Thyrotoxicosis, unspecified without thyrotoxic crisis or storm: Secondary | ICD-10-CM | POA: Insufficient documentation

## 2016-04-07 DIAGNOSIS — R946 Abnormal results of thyroid function studies: Secondary | ICD-10-CM | POA: Diagnosis not present

## 2016-04-07 MED ORDER — SODIUM IODIDE I 131 CAPSULE
30.9000 | Freq: Once | INTRAVENOUS | Status: AC | PRN
  Administered 2016-04-07: 30.9 via ORAL

## 2016-05-05 ENCOUNTER — Telehealth: Payer: Self-pay | Admitting: Endocrinology

## 2016-05-05 NOTE — Telephone Encounter (Signed)
I contacted the patient and advised of message via voicemail. Requested a call back from the patient to schedule visit.

## 2016-05-05 NOTE — Telephone Encounter (Signed)
Patient stated she had the RAI treatment, and haven't heard any thing back from Dr Loanne Drilling.  Please advise

## 2016-05-05 NOTE — Telephone Encounter (Signed)
See message and please advise, Thanks!  

## 2016-05-05 NOTE — Telephone Encounter (Signed)
Next step is ov in mid- to late March.

## 2016-05-10 ENCOUNTER — Other Ambulatory Visit: Payer: Self-pay | Admitting: Family Medicine

## 2016-05-12 ENCOUNTER — Other Ambulatory Visit: Payer: Self-pay | Admitting: Family Medicine

## 2016-05-13 MED ORDER — TRAMADOL HCL 50 MG PO TABS: 50.0000 mg | ORAL_TABLET | Freq: Three times a day (TID) | ORAL | 0 refills | Status: DC | PRN

## 2016-05-28 ENCOUNTER — Ambulatory Visit: Payer: Self-pay | Admitting: Cardiovascular Disease

## 2016-05-28 NOTE — Telephone Encounter (Signed)
See message to be advised, Thanks! 

## 2016-05-28 NOTE — Telephone Encounter (Signed)
Yes, when you come in for ov tomorrow, I can request for [facility]

## 2016-05-28 NOTE — Telephone Encounter (Signed)
Pt letting us know that her labs need to be sent to [facility] from now on. Did not know where to put this?

## 2016-05-29 ENCOUNTER — Encounter: Payer: Self-pay | Admitting: Cardiovascular Disease

## 2016-05-29 ENCOUNTER — Encounter: Payer: Self-pay | Admitting: Endocrinology

## 2016-05-29 ENCOUNTER — Ambulatory Visit (INDEPENDENT_AMBULATORY_CARE_PROVIDER_SITE_OTHER): Payer: Medicare Other | Admitting: Endocrinology

## 2016-05-29 ENCOUNTER — Ambulatory Visit (INDEPENDENT_AMBULATORY_CARE_PROVIDER_SITE_OTHER): Payer: Medicare Other | Admitting: Cardiovascular Disease

## 2016-05-29 VITALS — BP 142/92 | HR 76 | Ht 62.0 in | Wt 193.0 lb

## 2016-05-29 VITALS — BP 142/84 | HR 89 | Ht 62.0 in | Wt 194.0 lb

## 2016-05-29 DIAGNOSIS — E7800 Pure hypercholesterolemia, unspecified: Secondary | ICD-10-CM

## 2016-05-29 DIAGNOSIS — I10 Essential (primary) hypertension: Secondary | ICD-10-CM | POA: Diagnosis not present

## 2016-05-29 DIAGNOSIS — E785 Hyperlipidemia, unspecified: Secondary | ICD-10-CM | POA: Diagnosis not present

## 2016-05-29 DIAGNOSIS — E0590 Thyrotoxicosis, unspecified without thyrotoxic crisis or storm: Secondary | ICD-10-CM

## 2016-05-29 DIAGNOSIS — I4819 Other persistent atrial fibrillation: Secondary | ICD-10-CM

## 2016-05-29 DIAGNOSIS — I481 Persistent atrial fibrillation: Secondary | ICD-10-CM | POA: Diagnosis not present

## 2016-05-29 DIAGNOSIS — E0500 Thyrotoxicosis with diffuse goiter without thyrotoxic crisis or storm: Secondary | ICD-10-CM

## 2016-05-29 MED ORDER — FUROSEMIDE 20 MG PO TABS: 20.0000 mg | ORAL_TABLET | Freq: Every day | ORAL | 1 refills | Status: DC | PRN

## 2016-05-29 NOTE — Progress Notes (Signed)
Subjective:    Patient ID: Martha Perkins, female    DOB: 19-Feb-1946, 71 y.o.   MRN: [id number]  HPI Pt returns for f/u of hyperthyroidism (dx'ed April, 2017, in w/u of AF; she had partial right thyroid lobectomy in 1970; she was rx'ed tapazole and toprol, due to the need for prompt improvement; she had 2 doses of RAI in July of 2017 and Jan of 2018). she has weight gain, hot flashes, and hair loss.  Past Medical History:  Diagnosis Date  . Depression   . Hyperlipidemia   . Hypertension   . Hyperthyroidism 08/09/2015  . Kidney stone   . Paroxysmal atrial fibrillation ([place]) 08/09/2015  . Thyroid disease     Past Surgical History:  Procedure Laterality Date  . CATARACT EXTRACTION Left 09/2012   Dr. Bunnie Philips  . LITHOTRIPSY    . REPLACEMENT TOTAL KNEE BILATERAL  2011  . THYROIDECTOMY  1971  . TUBAL LIGATION  1979    Social History   Social History  . Marital status: Married    Spouse name: N/A  . Number of children: N/A  . Years of education: N/A   Occupational History  . Retired Therapist, sports.     Social History Main Topics  . Smoking status: Never Smoker  . Smokeless tobacco: Never Used  . Alcohol use No  . Drug use: No  . Sexual activity: Not on file   Other Topics Concern  . Not on file   Social History Narrative  . No narrative on file    Current Outpatient Prescriptions on File Prior to Visit  Medication Sig Dispense Refill  . albuterol (PROAIR HFA) 108 (90 Base) MCG/ACT inhaler Inhale 2 puffs into the lungs every 6 (six) hours as needed for wheezing or shortness of breath. 18 each 3  . atorvastatin (LIPITOR) 40 MG tablet TAKE 1 TABLET BY MOUTH  DAILY 90 tablet 3  . ELIQUIS 5 MG TABS tablet TAKE ONE TABLET BY MOUTH TWICE DAILY 60 tablet 5  . esomeprazole (NEXIUM) 40 MG capsule Take 1 capsule (40 mg total) by mouth daily. 90 capsule 0  . fluticasone (FLONASE) 50 MCG/ACT nasal spray USE 1 SPRAY IN EACH NOSTRIL TWO TIMES DAILY 48 g 1  . ketoconazole (NIZORAL) 2 % cream  APPLY TOPICALLY DAILY AS  NEEDED FOR IRRITATION. 60 g 1  . metoprolol succinate (TOPROL-XL) 100 MG 24 hr tablet Take 100 mg by mouth every evening. Take with or immediately following a meal.    . traMADol (ULTRAM) 50 MG tablet Take 1 tablet (50 mg total) by mouth 3 (three) times daily as needed. 270 tablet 0  . diltiazem (CARDIZEM CD) 240 MG 24 hr capsule Take 240 mg by mouth daily.    . furosemide (LASIX) 20 MG tablet Take 1 tablet (20 mg total) by mouth daily as needed (SWELLING). 90 tablet 1   No current facility-administered medications on file prior to visit.     Allergies  Allergen Reactions  . Demerol Nausea Only  . Meperidine Hcl Other (See Comments)    Feels loopy  . Oxycodone-Aspirin Other (See Comments)    Feels loopy  . Percocet [Oxycodone-Acetaminophen] Other (See Comments)    Feels loopy    Family History  Problem Relation Age of Onset  . Coronary artery disease Mother 55    deceased  . Depression Mother   . Hyperlipidemia Mother   . Diabetes Paternal Grandmother   . Thyroid disease Sister     BP Marland Kitchen)  142/84   Pulse 89   Ht 5\' 2"  (1.575 m)   Wt 194 lb (88 kg)   SpO2 97%   BMI 35.48 kg/m    Review of Systems She has little if any tremor    Objective:   Physical Exam VITAL SIGNS:  See vs page.  GENERAL: no distress.  NECK: a healed scar is present.  left thyroid mass is again noted.    Lab Results  Component Value Date   TSH <0.006 (L) 05/29/2016   T3TOTAL 319.0 (H) 07/02/2015      Assessment & Plan:  Hyperthyroidism: not better yet. AF, with well-controlled VR. Please come back for a follow-up appointment in 1 month.

## 2016-05-29 NOTE — Progress Notes (Signed)
Cardiology Office Note   Date:  05/30/2016   ID:  Martha Perkins, DOB June 12, 1945, MRN [id number]  PCP:  Beatrice Lecher, MD  Cardiologist:   Skeet Latch, MD   Chief Complaint  Patient presents with  . Follow-up    no chest pain, shortness of breath, has occassional edema, no pain or cramping in legs, rarely lightheaded or dizziness     History of Present Illness: Martha Perkins is a 71 y.o. female with paroxysmal atrial fibrillation, Grave's disease, hypertension, hyperlipidemia, and fatty liver disease who presents follow up on atrial fibrillation.  Martha Perkins was seen by her PCP, [name], on 07/13/15 for newly-diagnosed atrial fibrillation.  She was started on Eliquis and a low-dose beta blocker. Lab work revealed that she was hyperthyroid.  She had a thyroid uptake scan that revealed an enlarged, hyperfunctioning left lobe of the thyroid gland without discrete nodule. Findings were thought to be consistent with Graves' disease. She also underwent radioactive iodine therapy. She was seen in clinic 08/09/15 and metoprolol was increased.  Lisinopril was discontinued. She had an echo 08/2015 that revealed LVEF 50-55% with mild MR and TR.  Martha Perkins saw Martha Palau, Martha Perkins in the atrial fibrillation clinic on 10/29/15.  At that time her ventricular rate was better controlled.  There was a plan to pursue DCCV once her thyroid is better controlled.    Since her last appointment Martha Perkins Continues to have palpitations 2-3 nights per week. Martha Perkins is harder for her to fall asleep. She never has palpitations during the day. There is no associated chest pain, shortness of breath, lightheadedness or dizziness.  She saw Dr. Loanne Drilling earlier today and hopes that her thyroid is better so that she can undergo cardioversion.    Past Medical History:  Diagnosis Date  . Depression   . Hyperlipidemia   . Hypertension   . Hyperthyroidism 08/09/2015  . Kidney stone   . Paroxysmal atrial  fibrillation ([place]) 08/09/2015  . Thyroid disease     Past Surgical History:  Procedure Laterality Date  . CATARACT EXTRACTION Left 09/2012   Dr. Bunnie Philips  . LITHOTRIPSY    . REPLACEMENT TOTAL KNEE BILATERAL  2011  . THYROIDECTOMY  1971  . TUBAL LIGATION  1979     Current Outpatient Prescriptions  Medication Sig Dispense Refill  . albuterol (PROAIR HFA) 108 (90 Base) MCG/ACT inhaler Inhale 2 puffs into the lungs every 6 (six) hours as needed for wheezing or shortness of breath. 18 each 3  . atorvastatin (LIPITOR) 40 MG tablet TAKE 1 TABLET BY MOUTH  DAILY 90 tablet 3  . diltiazem (CARDIZEM CD) 240 MG 24 hr capsule Take 240 mg by mouth daily.    Marland Kitchen ELIQUIS 5 MG TABS tablet TAKE ONE TABLET BY MOUTH TWICE DAILY 60 tablet 5  . esomeprazole (NEXIUM) 40 MG capsule Take 1 capsule (40 mg total) by mouth daily. 90 capsule 0  . fluticasone (FLONASE) 50 MCG/ACT nasal spray USE 1 SPRAY IN EACH NOSTRIL TWO TIMES DAILY 48 g 1  . ketoconazole (NIZORAL) 2 % cream APPLY TOPICALLY DAILY AS  NEEDED FOR IRRITATION. 60 g 1  . metoprolol succinate (TOPROL-XL) 100 MG 24 hr tablet Take 100 mg by mouth every evening. Take with or immediately following a meal.    . traMADol (ULTRAM) 50 MG tablet Take 1 tablet (50 mg total) by mouth 3 (three) times daily as needed. 270 tablet 0  . furosemide (LASIX) 20 MG tablet Take 1  tablet (20 mg total) by mouth daily as needed (SWELLING). 90 tablet 1   No current facility-administered medications for this visit.     Allergies:   Demerol; Meperidine hcl; Oxycodone-aspirin; and Percocet [oxycodone-acetaminophen]    Social History:  The patient  reports that she has never smoked. She has never used smokeless tobacco. She reports that she does not drink alcohol or use drugs.   Family History:  The patient's family history includes Coronary artery disease (age of onset: 31) in her mother; Depression in her mother; Diabetes in her paternal grandmother; Hyperlipidemia in her  mother; Thyroid disease in her sister.    ROS:  Please see the history of present illness.   Otherwise, review of systems are positive for none.   All other systems are reviewed and negative.    PHYSICAL EXAM: VS:  BP (!) 142/92   Pulse 76   Ht 5\' 2"  (1.575 m)   Wt 87.5 kg (193 lb)   BMI 35.30 kg/m  , BMI Body mass index is 35.3 kg/m. GENERAL:  Well appearing HEENT:  Pupils equal round and reactive, fundi not visualized, oral mucosa unremarkable NECK:  No jugular venous distention, waveform within normal limits, carotid upstroke brisk and symmetric, no bruits LYMPHATICS:  No cervical adenopathy LUNGS:  Clear to auscultation bilaterally HEART:  Irregularly irregular. PMI not displaced or sustained,S1 and S2 within normal limits, no S3, no S4, no clicks, no rubs, I/VI systolic murmur at the LUSB.  ABD:  Flat, positive bowel sounds normal in frequency in pitch, no bruits, no rebound, no guarding, no midline pulsatile mass, no hepatomegaly, no splenomegaly EXT:  2 plus pulses throughout, no edema, no cyanosis no clubbing SKIN:  No rashes no nodules NEURO:  Cranial nerves II through XII grossly intact, motor grossly intact throughout PSYCH:  Cognitively intact, oriented to person place and time   EKG:  EKG is not ordered today. The ekg ordered 12/11/15 demonstatrial fibrillation. Rate 64 bpm. PVC. Prior septal infarct.  Right axis deviation.  Recent Labs: 10/18/2015: ALT 19; BUN 14; Creat 0.50; Potassium 4.0; Sodium 141 12/10/2015: Hemoglobin 15.2; Platelets 216 05/29/2016: TSH <0.006    Lipid Panel    Component Value Date/Time   CHOL 124 (L) 07/02/2015 0959   TRIG 170 (H) 07/02/2015 0959   HDL 31 (L) 07/02/2015 0959   CHOLHDL 4.0 07/02/2015 0959   VLDL 34 (H) 07/02/2015 0959   LDLCALC 59 07/02/2015 0959      Wt Readings from Last 3 Encounters:  05/29/16 87.5 kg (193 lb)  05/29/16 88 kg (194 lb)  03/28/16 85 kg (187 lb 8 oz)      ASSESSMENT AND PLAN:  # Atrial  fibrillation: Martha Perkins remains in atrial fibrillation that is likely attributable to her hyperthyroidism.  She continues to have palpitations mostly at night. We will increase diltiazem to 240 mg. She continues to work with Dr. Loanne Drilling to get her thyroid under control. Will plan for cardioversion once euthyroid. She is eager to stop her anticoagulation and nodal agents. We discussed the fact that her left atrium is moderately enlarged and it is possible that her atrial fibrillation could recur.  This patients CHA2DS2-VASc Score and unadjusted Ischemic Stroke Rate (% per year) is equal to 3.2 % stroke rate/year from a score of 3  Above score calculated as 1 point each if present [CHF, HTN, DM, Vascular=MI/PAD/Aortic Plaque, Age if 65-74, or Female] Above score calculated as 2 points each if present [Age > 75, or  Stroke/TIA/TE]  # Hypertension:  Blood pressure remains elevated. We will increase diltiazem as above. She has not been taking hydrochlorothiazide but does continue to have some intermittent swelling. We will stop hydrochlorothiazide and she will use furosemide 20 mg daily as needed for edema.  # Hyperlipidemia: Continue atorvastatin 40mg  daily.  LDL 59 06/2015.   Current medicines are reviewed at length with the patient today.  The patient does not have concerns regarding medicines.  The following changes have been made: Increase diltiazem. Stop hydrochlorothiazide. Start Lasix as needed.  Labs/ tests ordered today include:   No orders of the defined types were placed in this encounter.    Disposition:   FU with Martha Pearse C. Oval Linsey, MD, [facility] in 6 weeks.    This note was written with the assistance of speech recognition software.  Please excuse any transcriptional errors.  Signed, Zayne Draheim C. Oval Linsey, MD, [facility]  05/30/2016 6:46 PM    [place]

## 2016-05-29 NOTE — Patient Instructions (Addendum)
Medication Instructions:  INCREASE YOUR DILTIAZEM TO 240 MG DAILY  STOP HYDROCHLOROTHIAZIDE   START FUROSEMIDE 20 MG DAILY AS NEEDED FOR SWELLING  Labwork: NONE  Testing/Procedures: NONE   Follow-Up: Your physician recommends that you schedule a follow-up appointment in: EARLY MAY   If you need a refill on your cardiac medications before your next appointment, please call your pharmacy.

## 2016-05-29 NOTE — Patient Instructions (Signed)
blood tests are requested for you today.  We'll let you know about the results.  If you cardiologist wants to, you can take methimazole to slow the thyroid while the iodine is working.  The downside to this would be when the thyroid improves, we don't know if it is the medication or the iodine working. Please come back for a follow-up appointment in 1 month.

## 2016-05-30 LAB — T4, FREE: Free T4: 1.26 ng/dL (ref 0.82–1.77)

## 2016-05-30 LAB — TSH

## 2016-06-24 ENCOUNTER — Other Ambulatory Visit: Payer: Self-pay | Admitting: Cardiovascular Disease

## 2016-06-24 MED ORDER — DILTIAZEM HCL ER COATED BEADS 240 MG PO CP24: 240.0000 mg | ORAL_CAPSULE | Freq: Every day | ORAL | 3 refills | Status: DC

## 2016-06-24 NOTE — Telephone Encounter (Signed)
°*  STAT* If patient is at the pharmacy, call can be transferred to refill team.   1. Which medications need to be refilled? (please list name of each medication and dose if known) diltiazem 240 mg  2. Which pharmacy/location (including street and city if local pharmacy) is medication to be sent to?[place], [place] [employer]  [phone]  3. Do they need a 30 day or 90 day supply? [place]

## 2016-06-24 NOTE — Telephone Encounter (Signed)
Rx(s) sent to pharmacy electronically.  

## 2016-06-30 ENCOUNTER — Ambulatory Visit (INDEPENDENT_AMBULATORY_CARE_PROVIDER_SITE_OTHER): Payer: Medicare Other | Admitting: Endocrinology

## 2016-06-30 VITALS — BP 136/84 | HR 83 | Ht 62.0 in | Wt 198.0 lb

## 2016-06-30 DIAGNOSIS — E0590 Thyrotoxicosis, unspecified without thyrotoxic crisis or storm: Secondary | ICD-10-CM

## 2016-06-30 NOTE — Patient Instructions (Signed)
blood tests are requested for you today.  We'll let you know about the results.  Please come back for a follow-up appointment in 1 month.  

## 2016-06-30 NOTE — Progress Notes (Signed)
Subjective:    Patient ID: Martha Perkins, female    DOB: 30-Jan-1946, 71 y.o.   MRN: [id number]  HPI Pt returns for f/u of hyperthyroidism (dx'ed April, 2017, in w/u of AF; she had partial right thyroid lobectomy in 1970; she was rx'ed tapazole and toprol, due to the need for prompt improvement; she had 2 doses of RAI in July of 2017 and Jan of 2018).  pt states she feels well in general., except for weight gain.  Past Medical History:  Diagnosis Date  . Depression   . Hyperlipidemia   . Hypertension   . Hyperthyroidism 08/09/2015  . Kidney stone   . Paroxysmal atrial fibrillation ([place]) 08/09/2015  . Thyroid disease     Past Surgical History:  Procedure Laterality Date  . CATARACT EXTRACTION Left 09/2012   Dr. Bunnie Philips  . LITHOTRIPSY    . REPLACEMENT TOTAL KNEE BILATERAL  2011  . THYROIDECTOMY  1971  . TUBAL LIGATION  1979    Social History   Social History  . Marital status: Married    Spouse name: N/A  . Number of children: N/A  . Years of education: N/A   Occupational History  . Retired Therapist, sports.     Social History Main Topics  . Smoking status: Never Smoker  . Smokeless tobacco: Never Used  . Alcohol use No  . Drug use: No  . Sexual activity: Not on file   Other Topics Concern  . Not on file   Social History Narrative  . No narrative on file    Current Outpatient Prescriptions on File Prior to Visit  Medication Sig Dispense Refill  . albuterol (PROAIR HFA) 108 (90 Base) MCG/ACT inhaler Inhale 2 puffs into the lungs every 6 (six) hours as needed for wheezing or shortness of breath. 18 each 3  . atorvastatin (LIPITOR) 40 MG tablet TAKE 1 TABLET BY MOUTH  DAILY 90 tablet 3  . diltiazem (CARDIZEM CD) 240 MG 24 hr capsule Take 1 capsule (240 mg total) by mouth daily. 90 capsule 3  . ELIQUIS 5 MG TABS tablet TAKE ONE TABLET BY MOUTH TWICE DAILY 60 tablet 5  . esomeprazole (NEXIUM) 40 MG capsule Take 1 capsule (40 mg total) by mouth daily. 90 capsule 0  . fluticasone  (FLONASE) 50 MCG/ACT nasal spray USE 1 SPRAY IN EACH NOSTRIL TWO TIMES DAILY 48 g 1  . furosemide (LASIX) 20 MG tablet Take 1 tablet (20 mg total) by mouth daily as needed (SWELLING). 90 tablet 1  . ketoconazole (NIZORAL) 2 % cream APPLY TOPICALLY DAILY AS  NEEDED FOR IRRITATION. 60 g 1  . metoprolol succinate (TOPROL-XL) 100 MG 24 hr tablet Take 100 mg by mouth every evening. Take with or immediately following a meal.     No current facility-administered medications on file prior to visit.     Allergies  Allergen Reactions  . Demerol Nausea Only  . Meperidine Hcl Other (See Comments)    Feels loopy  . Oxycodone-Aspirin Other (See Comments)    Feels loopy  . Percocet [Oxycodone-Acetaminophen] Other (See Comments)    Feels loopy    Family History  Problem Relation Age of Onset  . Coronary artery disease Mother 61    deceased  . Depression Mother   . Hyperlipidemia Mother   . Diabetes Paternal Grandmother   . Thyroid disease Sister     BP 136/84   Pulse 83   Ht 5\' 2"  (1.575 m)   Wt 198 lb (89.8  kg)   SpO2 94%   BMI 36.21 kg/m    Review of Systems She denies fever and palpitations.      Objective:   Physical Exam VITAL SIGNS:  See vs page.  GENERAL: no distress.  NECK: a healed scar is present.  left thyroid mass is again noted.    Lab Results  Component Value Date   TSH 0.260 (L) 06/30/2016   T3TOTAL 319.0 (H) 07/02/2015      Assessment & Plan:  Hyperthyroidism: improved Left thyroid mass: not improved. Please come back for a follow-up appointment in 1 month.

## 2016-07-01 ENCOUNTER — Other Ambulatory Visit: Payer: Self-pay | Admitting: *Deleted

## 2016-07-01 LAB — T4, FREE: Free T4: 0.96 ng/dL (ref 0.82–1.77)

## 2016-07-01 LAB — TSH: TSH: 0.26 u[IU]/mL — ABNORMAL LOW (ref 0.450–4.500)

## 2016-07-01 MED ORDER — TRAMADOL HCL 50 MG PO TABS: 50.0000 mg | ORAL_TABLET | Freq: Three times a day (TID) | ORAL | 0 refills | Status: DC | PRN

## 2016-07-10 ENCOUNTER — Ambulatory Visit: Payer: Self-pay | Admitting: Family Medicine

## 2016-07-15 ENCOUNTER — Encounter: Payer: Self-pay | Admitting: Family Medicine

## 2016-07-15 ENCOUNTER — Ambulatory Visit (INDEPENDENT_AMBULATORY_CARE_PROVIDER_SITE_OTHER): Payer: Medicare Other | Admitting: Family Medicine

## 2016-07-15 VITALS — BP 127/60 | HR 70 | Wt 198.0 lb

## 2016-07-15 DIAGNOSIS — E8881 Metabolic syndrome: Secondary | ICD-10-CM | POA: Diagnosis not present

## 2016-07-15 DIAGNOSIS — L659 Nonscarring hair loss, unspecified: Secondary | ICD-10-CM

## 2016-07-15 DIAGNOSIS — I10 Essential (primary) hypertension: Secondary | ICD-10-CM

## 2016-07-15 DIAGNOSIS — I480 Paroxysmal atrial fibrillation: Secondary | ICD-10-CM

## 2016-07-15 DIAGNOSIS — R600 Localized edema: Secondary | ICD-10-CM | POA: Diagnosis not present

## 2016-07-15 NOTE — Progress Notes (Signed)
Subjective:    CC: HTN, IFG  HPI:  Hypertension- Pt denies chest pain, SOB, dizziness, or heart palpitations.  Taking meds as directed w/o problems.  Denies medication side effects.  She had noticed some more recent swelling discuss with her cardiologist. Initially she started her on HCTZ but that was causing frequent urination. They decided to switch her to Lasix to take as needed. She wanted to how often she could take it.  Afib - Currently on Eloquist.She is doing well with the medication. Her weight rate is well controlled. She actually recently saw her cardiologist.  Impaired fasting glucose-no increased thirst or urination. No symptoms consistent with hypoglycemia.  She is concerned because she's had some significant hair loss over the last year to she's had problems with her thyroid. She says the texture of the hair itself is changed as well as just thinning in general over the scalp. She's not currently treating it with anything in particular.   Past medical history, Surgical history, Family history not pertinant except as noted below, Social history, Allergies, and medications have been entered into the medical record, reviewed, and corrections made.   Review of Systems: No fevers, chills, night sweats, weight loss, chest pain, or shortness of breath.   Objective:    General: Well Developed, well nourished, and in no acute distress.  Neuro: Alert and oriented x3, extra-ocular muscles intact, sensation grossly intact.  HEENT: Normocephalic, atraumatic  Skin: Warm and dry, no rashes. Cardiac: Regular rate and rhythm, no murmurs rubs or gallops, no lower extremity edema.  Respiratory: Clear to auscultation bilaterally. Not using accessory muscles, speaking in full sentences.   Impression and Recommendations:    HTN - Well controlled. Continue current regimen. Follow up in  6 mohths.   Lower extremity swelling-I think she would benefit from compression stockings especially since  she doesn't like to take the Lasix. If she does use Lasix don't use more than 3 days per week. If she is using more frequently than that and she probably needs to have a potassium checked.   Afib - Stable. Rate is well controlled currently.  IFG - well controlled. Hemoglobin A1c of 5.6 today which is fantastic. Recheck in one year.  Hairloss - recommend a trial of women's Rogaine. Especially since her loss is diffuse and likely because of thyroid issues and medication side effects.

## 2016-07-16 ENCOUNTER — Ambulatory Visit: Payer: Self-pay | Admitting: Family Medicine

## 2016-07-18 ENCOUNTER — Encounter: Payer: Self-pay | Admitting: Cardiovascular Disease

## 2016-07-18 ENCOUNTER — Ambulatory Visit (INDEPENDENT_AMBULATORY_CARE_PROVIDER_SITE_OTHER): Payer: Medicare Other | Admitting: Cardiovascular Disease

## 2016-07-18 VITALS — BP 120/86 | HR 80 | Ht 62.0 in | Wt 200.0 lb

## 2016-07-18 DIAGNOSIS — I4891 Unspecified atrial fibrillation: Secondary | ICD-10-CM | POA: Diagnosis not present

## 2016-07-18 DIAGNOSIS — I481 Persistent atrial fibrillation: Secondary | ICD-10-CM

## 2016-07-18 DIAGNOSIS — I10 Essential (primary) hypertension: Secondary | ICD-10-CM | POA: Diagnosis not present

## 2016-07-18 DIAGNOSIS — I4819 Other persistent atrial fibrillation: Secondary | ICD-10-CM

## 2016-07-18 DIAGNOSIS — E785 Hyperlipidemia, unspecified: Secondary | ICD-10-CM | POA: Diagnosis not present

## 2016-07-18 NOTE — Patient Instructions (Addendum)
Medication Instructions:  Your physician recommends that you continue on your current medications as directed. Please refer to the Current Medication list given to you today.  Labwork: NONE  Testing/Procedures: NONE  Follow-Up: Your physician recommends that you schedule a follow-up appointment in: 2 MONTH OV  Any Other Special Instructions Will Be Listed Below (If Applicable).  Your provider has recommended a cardioversion.   You are scheduled for a cardioversion on 08/05/16 at 40 NOON with Dr. Oval Linsey or associates. Please go to [facility] at 10:30 AM .  Enter through the [place] not have any food or drink after midnight NIGHT BEFORE.  You may take your medicines with a sip of water on the day of your procedure.  You will need someone to drive you home following your procedure.   Call the [place] office at [phone] if you have any questions, problems or concerns.     Electrical Cardioversion Electrical cardioversion is the delivery of a jolt of electricity to change the rhythm of the heart. Sticky patches or metal paddles are placed on the chest to deliver the electricity from a device. This is done to restore a normal rhythm. A rhythm that is too fast or not regular keeps the heart from pumping well. Electrical cardioversion is done in an emergency if:   There is low or no blood pressure as a result of the heart rhythm.   Normal rhythm must be restored as fast as possible to protect the brain and heart from further damage.   It may save a life. Cardioversion may be done for heart rhythms that are not immediately life threatening, such as atrial fibrillation or flutter, in which:   The heart is beating too fast or is not regular.   Medicine to change the rhythm has not worked.   It is safe to wait in order to allow time for preparation.  Symptoms of the abnormal rhythm are bothersome.  The  risk of stroke and other serious problems can be reduced.  LET [facility] CARE PROVIDER KNOW ABOUT:   Any allergies you have.  All medicines you are taking, including vitamins, herbs, eye drops, creams, and over-the-counter medicines.  Previous problems you or members of your family have had with the use of anesthetics.   Any blood disorders you have.   Previous surgeries you have had.   Medical conditions you have.   RISKS AND COMPLICATIONS  Generally, this is a safe procedure. However, problems can occur and include:   Breathing problems related to the anesthetic used.  A blood clot that breaks free and travels to other parts of your body. This could cause a stroke or other problems. The risk of this is lowered by use of blood-thinning medicine (anticoagulant) prior to the procedure.  Cardiac arrest (rare).   BEFORE THE PROCEDURE   You may have tests to detect blood clots in your heart and to evaluate heart function.  You may start taking anticoagulants so your blood does not clot as easily.   Medicines may be given to help stabilize your heart rate and rhythm.   PROCEDURE  You will be given medicine through an IV tube to reduce discomfort and make you sleepy (sedative).   An electrical shock will be delivered.   AFTER THE PROCEDURE Your heart rhythm will be watched to make sure it does not change. You will need someone to drive you home.

## 2016-07-18 NOTE — Progress Notes (Signed)
Cardiology Office Note   Date:  07/18/2016   ID:  Martha Perkins, DOB 1946-01-20, MRN [id number]  PCP:  Hali Marry, MD  Cardiologist:   Skeet Latch, MD   Chief Complaint  Patient presents with  . Follow-up    pt c/o SOB on exertion--no more than usual; swelling in both legs/feet/ankles; no other Sx.     History of Present Illness: Martha Perkins is a 71 y.o. female with paroxysmal atrial fibrillation, Grave's disease, hypertension, hyperlipidemia, and fatty liver disease who presents follow up on atrial fibrillation.  Martha Perkins was seen by her PCP, [name], on 07/13/15 for newly-diagnosed atrial fibrillation.  She was started on Eliquis and a low-dose beta blocker. Lab work revealed that she was hyperthyroid.  She had a thyroid uptake scan that revealed an enlarged, hyperfunctioning left lobe of the thyroid gland without discrete nodule. Findings were thought to be consistent with Graves' disease. She also underwent radioactive iodine therapy. She was seen in clinic 08/09/15 and metoprolol was increased.  Lisinopril was discontinued. She had an echo 08/2015 that revealed LVEF 50-55% with moderate left atrial enlargement, mild MR and TR.    Since her last appointment Martha Perkins has been doing well.  At her last appointment diltiazem was increased.  She occasionally has palpitations, but less frequently.  She denies chest pain or shortness of breath.  She also notes some lower extremity edema and weight gain.  She gained 2 lb in the last two days but denies orthopnea or PND.  She last had labs checked two weeks ago and her free T4 was normal and TSH had increased to 0.26.   Past Medical History:  Diagnosis Date  . Depression   . Hyperlipidemia   . Hypertension   . Hyperthyroidism 08/09/2015  . Kidney stone   . Paroxysmal atrial fibrillation ([place]) 08/09/2015  . Thyroid disease     Past Surgical History:  Procedure Laterality Date  . CATARACT EXTRACTION Left 09/2012    Dr. Bunnie Philips  . LITHOTRIPSY    . REPLACEMENT TOTAL KNEE BILATERAL  2011  . THYROIDECTOMY  1971  . TUBAL LIGATION  1979     Current Outpatient Prescriptions  Medication Sig Dispense Refill  . albuterol (PROAIR HFA) 108 (90 Base) MCG/ACT inhaler Inhale 2 puffs into the lungs every 6 (six) hours as needed for wheezing or shortness of breath. 18 each 3  . atorvastatin (LIPITOR) 40 MG tablet TAKE 1 TABLET BY MOUTH  DAILY 90 tablet 3  . diltiazem (CARDIZEM CD) 240 MG 24 hr capsule Take 1 capsule (240 mg total) by mouth daily. 90 capsule 3  . ELIQUIS 5 MG TABS tablet TAKE ONE TABLET BY MOUTH TWICE DAILY 60 tablet 5  . esomeprazole (NEXIUM) 40 MG capsule Take 1 capsule (40 mg total) by mouth daily. 90 capsule 0  . fluticasone (FLONASE) 50 MCG/ACT nasal spray USE 1 SPRAY IN EACH NOSTRIL TWO TIMES DAILY 48 g 1  . furosemide (LASIX) 20 MG tablet Take 1 tablet (20 mg total) by mouth daily as needed (SWELLING). 90 tablet 1  . ketoconazole (NIZORAL) 2 % cream APPLY TOPICALLY DAILY AS  NEEDED FOR IRRITATION. 60 g 1  . metoprolol succinate (TOPROL-XL) 100 MG 24 hr tablet Take 100 mg by mouth every evening. Take with or immediately following a meal.    . traMADol (ULTRAM) 50 MG tablet Take 1 tablet (50 mg total) by mouth 3 (three) times daily as needed. 270 tablet 0  No current facility-administered medications for this visit.     Allergies:   Demerol; Meperidine hcl; Oxycodone-aspirin; and Percocet [oxycodone-acetaminophen]    Social History:  The patient  reports that she has never smoked. She has never used smokeless tobacco. She reports that she does not drink alcohol or use drugs.   Family History:  The patient's family history includes Coronary artery disease (age of onset: 66) in her mother; Depression in her mother; Diabetes in her paternal grandmother; Hyperlipidemia in her mother; Thyroid disease in her sister.    ROS:  Please see the history of present illness.   Otherwise, review  of systems are positive for none.   All other systems are reviewed and negative.    PHYSICAL EXAM: VS:  BP 120/86 (BP Location: Left Arm, Patient Position: Sitting, Cuff Size: Normal)   Pulse 80   Ht 5\' 2"  (1.575 m)   Wt 90.7 kg (200 lb)   BMI 36.58 kg/m  , BMI Body mass index is 36.58 kg/m. GENERAL:  Well appearing HEENT:  Pupils equal round and reactive, fundi not visualized, oral mucosa unremarkable NECK:  No jugular venous distention, waveform within normal limits, carotid upstroke brisk and symmetric, no bruits LYMPHATICS:  No cervical adenopathy LUNGS:  Clear to auscultation bilaterally HEART:  Irregularly irregular. PMI not displaced or sustained,S1 and S2 within normal limits, no S3, no S4, no clicks, no rubs, I/VI systolic murmur at the LUSB.  ABD:  Flat, positive bowel sounds normal in frequency in pitch, no bruits, no rebound, no guarding, no midline pulsatile mass, no hepatomegaly, no splenomegaly EXT:  2 plus pulses throughout, no edema, no cyanosis no clubbing SKIN:  No rashes no nodules NEURO:  Cranial nerves II through XII grossly intact, motor grossly intact throughout PSYCH:  Cognitively intact, oriented to person place and time   EKG:  EKG is not ordered today. The ekg ordered 12/11/15 demonstatrial fibrillation. Rate 64 bpm. PVC. Prior septal infarct.  Right axis deviation. 07/18/16: Atrial fibrillation.  Rate 80 bpm.    Recent Labs: 10/18/2015: ALT 19; BUN 14; Creat 0.50; Potassium 4.0; Sodium 141 12/10/2015: Hemoglobin 15.2; Platelets 216 06/30/2016: TSH 0.260    Lipid Panel    Component Value Date/Time   CHOL 124 (L) 07/02/2015 0959   TRIG 170 (H) 07/02/2015 0959   HDL 31 (L) 07/02/2015 0959   CHOLHDL 4.0 07/02/2015 0959   VLDL 34 (H) 07/02/2015 0959   LDLCALC 59 07/02/2015 0959      Wt Readings from Last 3 Encounters:  07/18/16 90.7 kg (200 lb)  07/15/16 89.8 kg (198 lb)  06/30/16 89.8 kg (198 lb)      ASSESSMENT AND PLAN:  # Atrial  fibrillation: Martha Perkins remains in atrial fibrillation that is likely attributable to her hyperthyroidism.  Her palpitations are better controlled but persist.  It seems that her thyroid is going in the right direction.  We will attempt DCCV 5/29.  Continue diltiazem and metoprolol.  We discussed the fact that her left atrium is moderately enlarged and it is possible that her atrial fibrillation could recur.  This patients CHA2DS2-VASc Score and unadjusted Ischemic Stroke Rate (% per year) is equal to 3.2 % stroke rate/year from a score of 3  Above score calculated as 1 point each if present [CHF, HTN, DM, Vascular=MI/PAD/Aortic Plaque, Age if 65-74, or Female] Above score calculated as 2 points each if present [Age > 75, or Stroke/TIA/TE]  # Hypertension:  Blood pressure is better controlled.  Continue  diltiazem and metoprolol.  # Hyperlipidemia: Continue atorvastatin 40mg  daily.  LDL 59 06/2015.  Will repeat at follow up. He   Current medicines are reviewed at length with the patient today.  The patient does not have concerns regarding medicines.  The following changes have been made: none  Labs/ tests ordered today include:   Orders Placed This Encounter  Procedures  . EKG 12-Lead     Disposition:   FU with Kamillah Didonato C. Oval Linsey, MD, [facility] in 2 months   This note was written with the assistance of speech recognition software.  Please excuse any transcriptional errors.  Signed, Suzana Sohail C. Oval Linsey, MD, [facility]  07/18/2016 2:17 PM    [facility]

## 2016-07-31 ENCOUNTER — Encounter: Payer: Self-pay | Admitting: Endocrinology

## 2016-07-31 ENCOUNTER — Ambulatory Visit (INDEPENDENT_AMBULATORY_CARE_PROVIDER_SITE_OTHER): Payer: Medicare Other | Admitting: Endocrinology

## 2016-07-31 VITALS — BP 120/82 | HR 63 | Ht 62.0 in | Wt 201.0 lb

## 2016-07-31 DIAGNOSIS — E0590 Thyrotoxicosis, unspecified without thyrotoxic crisis or storm: Secondary | ICD-10-CM | POA: Diagnosis not present

## 2016-07-31 NOTE — Patient Instructions (Signed)
A thyroid blood test is requested for you today.  We'll let you know about the results.   Please come back for a follow-up appointment in 6 weeks.

## 2016-07-31 NOTE — Progress Notes (Signed)
Subjective:    Patient ID: Martha Perkins, female    DOB: [birth year], 71 y.o.   MRN: [id number]  HPI Pt returns for f/u of hyperthyroidism (dx'ed April, 2017, in w/u of AF; she had partial right thyroid lobectomy in 1970; she was rx'ed tapazole and toprol, due to the need for prompt improvement; she had 2 doses of RAI (July of 2017 and Jan of 2018).  pt states she feels well in general., except for weight gain.  She takes biotin.  DC cardioversion is sched for next week.   Past Medical History:  Diagnosis Date  . Depression   . Hyperlipidemia   . Hypertension   . Hyperthyroidism 08/09/2015  . Kidney stone   . Paroxysmal atrial fibrillation ([place]) 08/09/2015  . Thyroid disease     Past Surgical History:  Procedure Laterality Date  . CATARACT EXTRACTION Left 09/2012   Dr. Bunnie Philips  . LITHOTRIPSY    . REPLACEMENT TOTAL KNEE BILATERAL  2011  . THYROIDECTOMY  1971  . TUBAL LIGATION  1979    Social History   Social History  . Marital status: Married    Spouse name: N/A  . Number of children: N/A  . Years of education: N/A   Occupational History  . Retired Therapist, sports.     Social History Main Topics  . Smoking status: Never Smoker  . Smokeless tobacco: Never Used  . Alcohol use No  . Drug use: No  . Sexual activity: Not on file   Other Topics Concern  . Not on file   Social History Narrative  . No narrative on file    Current Outpatient Prescriptions on File Prior to Visit  Medication Sig Dispense Refill  . albuterol (PROAIR HFA) 108 (90 Base) MCG/ACT inhaler Inhale 2 puffs into the lungs every 6 (six) hours as needed for wheezing or shortness of breath. 18 each 3  . atorvastatin (LIPITOR) 40 MG tablet TAKE 1 TABLET BY MOUTH  DAILY 90 tablet 3  . diltiazem (CARDIZEM CD) 240 MG 24 hr capsule Take 1 capsule (240 mg total) by mouth daily. 90 capsule 3  . ELIQUIS 5 MG TABS tablet TAKE ONE TABLET BY MOUTH TWICE DAILY 60 tablet 5  . esomeprazole (NEXIUM) 40 MG capsule Take 1 capsule  (40 mg total) by mouth daily. 90 capsule 0  . fluticasone (FLONASE) 50 MCG/ACT nasal spray USE 1 SPRAY IN EACH NOSTRIL TWO TIMES DAILY 48 g 1  . furosemide (LASIX) 20 MG tablet Take 1 tablet (20 mg total) by mouth daily as needed (SWELLING). 90 tablet 1  . ketoconazole (NIZORAL) 2 % cream APPLY TOPICALLY DAILY AS  NEEDED FOR IRRITATION. 60 g 1  . metoprolol succinate (TOPROL-XL) 100 MG 24 hr tablet Take 100 mg by mouth every evening. Take with or immediately following a meal.    . traMADol (ULTRAM) 50 MG tablet Take 1 tablet (50 mg total) by mouth 3 (three) times daily as needed. 270 tablet 0   No current facility-administered medications on file prior to visit.     Allergies  Allergen Reactions  . Demerol Nausea Only  . Meperidine Hcl Other (See Comments)    Feels loopy  . Oxycodone-Aspirin Other (See Comments)    Feels loopy  . Percocet [Oxycodone-Acetaminophen] Other (See Comments)    Feels loopy    Family History  Problem Relation Age of Onset  . Coronary artery disease Mother 64       deceased  . Depression Mother   .  Hyperlipidemia Mother   . Diabetes Paternal Grandmother   . Thyroid disease Sister     BP 120/82   Pulse 63   Ht 5\' 2"  (1.575 m)   Wt 201 lb (91.2 kg)   SpO2 96%   BMI 36.76 kg/m    Review of Systems No change in chronic mildly dry skin.      Objective:   Physical Exam VITAL SIGNS:  See vs page.  GENERAL: no distress.  NECK: a healed scar is present.  left thyroid mass is again noted.  Neuro: no tremor.  Skin: not diaphoretic.  PSYCH: Does not appear anxious nor depressed.    Lab Results  Component Value Date   TSH 12.150 (H) 07/31/2016   T3TOTAL 319.0 (H) 07/02/2015      Assessment & Plan:  Post-RAI hypothyroidism: new.  I rx'ed synthroid. Biotin intake: We'll just check TSH.  Thyroid mass: clinically unchanged.  We'll follow AF: she is good to go for her cardioversion.  Patient Instructions  A thyroid blood test is requested for  you today.  We'll let you know about the results.   Please come back for a follow-up appointment in 6 weeks.

## 2016-08-01 LAB — TSH: TSH: 12.15 u[IU]/mL — ABNORMAL HIGH (ref 0.450–4.500)

## 2016-08-01 MED ORDER — LEVOTHYROXINE SODIUM 50 MCG PO TABS: 50.0000 ug | ORAL_TABLET | Freq: Every day | ORAL | 5 refills | Status: DC

## 2016-08-05 ENCOUNTER — Ambulatory Visit (HOSPITAL_COMMUNITY): Payer: Medicare Other | Admitting: Certified Registered Nurse Anesthetist

## 2016-08-05 ENCOUNTER — Ambulatory Visit (HOSPITAL_COMMUNITY)
Admission: RE | Admit: 2016-08-05 | Discharge: 2016-08-05 | Disposition: A | Discharge status: Home / Self Care | Payer: Medicare Other | Source: Ambulatory Visit | Attending: Cardiovascular Disease | Admitting: Cardiovascular Disease

## 2016-08-05 ENCOUNTER — Encounter (HOSPITAL_COMMUNITY): Payer: Self-pay | Admitting: *Deleted

## 2016-08-05 ENCOUNTER — Encounter (HOSPITAL_COMMUNITY)
Admission: RE | Disposition: A | Discharge status: Home / Self Care | Payer: Self-pay | Source: Ambulatory Visit | Attending: Cardiovascular Disease

## 2016-08-05 DIAGNOSIS — Z96653 Presence of artificial knee joint, bilateral: Secondary | ICD-10-CM | POA: Insufficient documentation

## 2016-08-05 DIAGNOSIS — K219 Gastro-esophageal reflux disease without esophagitis: Secondary | ICD-10-CM | POA: Insufficient documentation

## 2016-08-05 DIAGNOSIS — I081 Rheumatic disorders of both mitral and tricuspid valves: Secondary | ICD-10-CM | POA: Insufficient documentation

## 2016-08-05 DIAGNOSIS — Z7901 Long term (current) use of anticoagulants: Secondary | ICD-10-CM | POA: Diagnosis not present

## 2016-08-05 DIAGNOSIS — E0590 Thyrotoxicosis, unspecified without thyrotoxic crisis or storm: Secondary | ICD-10-CM | POA: Diagnosis not present

## 2016-08-05 DIAGNOSIS — Z7951 Long term (current) use of inhaled steroids: Secondary | ICD-10-CM | POA: Diagnosis not present

## 2016-08-05 DIAGNOSIS — I480 Paroxysmal atrial fibrillation: Secondary | ICD-10-CM | POA: Diagnosis not present

## 2016-08-05 DIAGNOSIS — I10 Essential (primary) hypertension: Secondary | ICD-10-CM | POA: Diagnosis not present

## 2016-08-05 DIAGNOSIS — I481 Persistent atrial fibrillation: Secondary | ICD-10-CM

## 2016-08-05 DIAGNOSIS — I119 Hypertensive heart disease without heart failure: Secondary | ICD-10-CM | POA: Insufficient documentation

## 2016-08-05 DIAGNOSIS — E785 Hyperlipidemia, unspecified: Secondary | ICD-10-CM | POA: Diagnosis not present

## 2016-08-05 DIAGNOSIS — Z79899 Other long term (current) drug therapy: Secondary | ICD-10-CM | POA: Diagnosis not present

## 2016-08-05 DIAGNOSIS — Z8679 Personal history of other diseases of the circulatory system: Secondary | ICD-10-CM

## 2016-08-05 DIAGNOSIS — I4891 Unspecified atrial fibrillation: Secondary | ICD-10-CM | POA: Diagnosis not present

## 2016-08-05 HISTORY — PX: CARDIOVERSION: SHX1299

## 2016-08-05 LAB — POCT I-STAT 4, (NA,K, GLUC, HGB,HCT)
Glucose, Bld: 104 mg/dL — ABNORMAL HIGH (ref 65–99)
HCT: 40 % (ref 36.0–46.0)
Hemoglobin: 13.6 g/dL (ref 12.0–15.0)
Potassium: 3.7 mmol/L (ref 3.5–5.1)
SODIUM: 142 mmol/L (ref 135–145)

## 2016-08-05 SURGERY — CARDIOVERSION
Anesthesia: General

## 2016-08-05 MED ORDER — LIDOCAINE HCL (CARDIAC) 20 MG/ML IV SOLN
INTRAVENOUS | Status: DC | PRN
  Administered 2016-08-05: 60 mg via INTRATRACHEAL

## 2016-08-05 MED ORDER — SODIUM CHLORIDE 0.9 % IV SOLN
INTRAVENOUS | Status: DC
  Administered 2016-08-05: 12:00:00 via INTRAVENOUS

## 2016-08-05 MED ORDER — PROPOFOL 10 MG/ML IV BOLUS
INTRAVENOUS | Status: DC | PRN
  Administered 2016-08-05: 50 mg via INTRAVENOUS

## 2016-08-05 NOTE — H&P (View-Only) (Signed)
Cardiology Office Note   Date:  07/18/2016   ID:  Martha Perkins, DOB [birth year], MRN [id number]  PCP:  Hali Marry, MD  Cardiologist:   Skeet Latch, MD   Chief Complaint  Patient presents with  . Follow-up    pt c/o SOB on exertion--no more than usual; swelling in both legs/feet/ankles; no other Sx.     History of Present Illness: Martha Perkins is a 71 y.o. female with paroxysmal atrial fibrillation, Grave's disease, hypertension, hyperlipidemia, and fatty liver disease who presents follow up on atrial fibrillation.  Martha Perkins was seen by her PCP, [name], on 07/13/15 for newly-diagnosed atrial fibrillation.  She was started on Eliquis and a low-dose beta blocker. Lab work revealed that she was hyperthyroid.  She had a thyroid uptake scan that revealed an enlarged, hyperfunctioning left lobe of the thyroid gland without discrete nodule. Findings were thought to be consistent with Graves' disease. She also underwent radioactive iodine therapy. She was seen in clinic 08/09/15 and metoprolol was increased.  Lisinopril was discontinued. She had an echo 08/2015 that revealed LVEF 50-55% with moderate left atrial enlargement, mild MR and TR.    Since her last appointment Martha Perkins has been doing well.  At her last appointment diltiazem was increased.  She occasionally has palpitations, but less frequently.  She denies chest pain or shortness of breath.  She also notes some lower extremity edema and weight gain.  She gained 2 lb in the last two days but denies orthopnea or PND.  She last had labs checked two weeks ago and her free T4 was normal and TSH had increased to 0.26.   Past Medical History:  Diagnosis Date  . Depression   . Hyperlipidemia   . Hypertension   . Hyperthyroidism 08/09/2015  . Kidney stone   . Paroxysmal atrial fibrillation ([place]) 08/09/2015  . Thyroid disease     Past Surgical History:  Procedure Laterality Date  . CATARACT EXTRACTION Left 09/2012    Dr. Bunnie Philips  . LITHOTRIPSY    . REPLACEMENT TOTAL KNEE BILATERAL  2011  . THYROIDECTOMY  1971  . TUBAL LIGATION  1979     Current Outpatient Prescriptions  Medication Sig Dispense Refill  . albuterol (PROAIR HFA) 108 (90 Base) MCG/ACT inhaler Inhale 2 puffs into the lungs every 6 (six) hours as needed for wheezing or shortness of breath. 18 each 3  . atorvastatin (LIPITOR) 40 MG tablet TAKE 1 TABLET BY MOUTH  DAILY 90 tablet 3  . diltiazem (CARDIZEM CD) 240 MG 24 hr capsule Take 1 capsule (240 mg total) by mouth daily. 90 capsule 3  . ELIQUIS 5 MG TABS tablet TAKE ONE TABLET BY MOUTH TWICE DAILY 60 tablet 5  . esomeprazole (NEXIUM) 40 MG capsule Take 1 capsule (40 mg total) by mouth daily. 90 capsule 0  . fluticasone (FLONASE) 50 MCG/ACT nasal spray USE 1 SPRAY IN EACH NOSTRIL TWO TIMES DAILY 48 g 1  . furosemide (LASIX) 20 MG tablet Take 1 tablet (20 mg total) by mouth daily as needed (SWELLING). 90 tablet 1  . ketoconazole (NIZORAL) 2 % cream APPLY TOPICALLY DAILY AS  NEEDED FOR IRRITATION. 60 g 1  . metoprolol succinate (TOPROL-XL) 100 MG 24 hr tablet Take 100 mg by mouth every evening. Take with or immediately following a meal.    . traMADol (ULTRAM) 50 MG tablet Take 1 tablet (50 mg total) by mouth 3 (three) times daily as needed. 270 tablet 0  No current facility-administered medications for this visit.     Allergies:   Demerol; Meperidine hcl; Oxycodone-aspirin; and Percocet [oxycodone-acetaminophen]    Social History:  The patient  reports that she has never smoked. She has never used smokeless tobacco. She reports that she does not drink alcohol or use drugs.   Family History:  The patient's family history includes Coronary artery disease (age of onset: 58) in her mother; Depression in her mother; Diabetes in her paternal grandmother; Hyperlipidemia in her mother; Thyroid disease in her sister.    ROS:  Please see the history of present illness.   Otherwise, review  of systems are positive for none.   All other systems are reviewed and negative.    PHYSICAL EXAM: VS:  BP 120/86 (BP Location: Left Arm, Patient Position: Sitting, Cuff Size: Normal)   Pulse 80   Ht 5\' 2"  (1.575 m)   Wt 90.7 kg (200 lb)   BMI 36.58 kg/m  , BMI Body mass index is 36.58 kg/m. GENERAL:  Well appearing HEENT:  Pupils equal round and reactive, fundi not visualized, oral mucosa unremarkable NECK:  No jugular venous distention, waveform within normal limits, carotid upstroke brisk and symmetric, no bruits LYMPHATICS:  No cervical adenopathy LUNGS:  Clear to auscultation bilaterally HEART:  Irregularly irregular. PMI not displaced or sustained,S1 and S2 within normal limits, no S3, no S4, no clicks, no rubs, I/VI systolic murmur at the LUSB.  ABD:  Flat, positive bowel sounds normal in frequency in pitch, no bruits, no rebound, no guarding, no midline pulsatile mass, no hepatomegaly, no splenomegaly EXT:  2 plus pulses throughout, no edema, no cyanosis no clubbing SKIN:  No rashes no nodules NEURO:  Cranial nerves II through XII grossly intact, motor grossly intact throughout PSYCH:  Cognitively intact, oriented to person place and time   EKG:  EKG is not ordered today. The ekg ordered 12/11/15 demonstatrial fibrillation. Rate 64 bpm. PVC. Prior septal infarct.  Right axis deviation. 07/18/16: Atrial fibrillation.  Rate 80 bpm.    Recent Labs: 10/18/2015: ALT 19; BUN 14; Creat 0.50; Potassium 4.0; Sodium 141 12/10/2015: Hemoglobin 15.2; Platelets 216 06/30/2016: TSH 0.260    Lipid Panel    Component Value Date/Time   CHOL 124 (L) 07/02/2015 0959   TRIG 170 (H) 07/02/2015 0959   HDL 31 (L) 07/02/2015 0959   CHOLHDL 4.0 07/02/2015 0959   VLDL 34 (H) 07/02/2015 0959   LDLCALC 59 07/02/2015 0959      Wt Readings from Last 3 Encounters:  07/18/16 90.7 kg (200 lb)  07/15/16 89.8 kg (198 lb)  06/30/16 89.8 kg (198 lb)      ASSESSMENT AND PLAN:  # Atrial  fibrillation: Martha Perkins remains in atrial fibrillation that is likely attributable to her hyperthyroidism.  Her palpitations are better controlled but persist.  It seems that her thyroid is going in the right direction.  We will attempt DCCV 5/29.  Continue diltiazem and metoprolol.  We discussed the fact that her left atrium is moderately enlarged and it is possible that her atrial fibrillation could recur.  This patients CHA2DS2-VASc Score and unadjusted Ischemic Stroke Rate (% per year) is equal to 3.2 % stroke rate/year from a score of 3  Above score calculated as 1 point each if present [CHF, HTN, DM, Vascular=MI/PAD/Aortic Plaque, Age if 65-74, or Female] Above score calculated as 2 points each if present [Age > 75, or Stroke/TIA/TE]  # Hypertension:  Blood pressure is better controlled.  Continue  diltiazem and metoprolol.  # Hyperlipidemia: Continue atorvastatin 40mg  daily.  LDL 59 06/2015.  Will repeat at follow up. He   Current medicines are reviewed at length with the patient today.  The patient does not have concerns regarding medicines.  The following changes have been made: none  Labs/ tests ordered today include:   Orders Placed This Encounter  Procedures  . EKG 12-Lead     Disposition:   FU with Cerenity Goshorn C. Oval Linsey, MD, [facility] in 2 months   This note was written with the assistance of speech recognition software.  Please excuse any transcriptional errors.  Signed, Keanna Tugwell C. Oval Linsey, MD, [facility]  07/18/2016 2:17 PM    [facility]

## 2016-08-05 NOTE — Transfer of Care (Signed)
Immediate Anesthesia Transfer of Care Note  Patient: Martha Perkins  Procedure(s) Performed: Procedure(s): CARDIOVERSION (N/A)  Patient Location: PACU  Anesthesia Type:MAC  Level of Consciousness: drowsy  Airway & Oxygen Therapy: Patient Spontanous Breathing  Post-op Assessment: Report given to RN and Post -op Vital signs reviewed and stable  Post vital signs: Reviewed and stable  Last Vitals:  Vitals:   08/05/16 1032  BP: 134/65  Pulse: 79  Resp: 18  Temp: 36.8 C    Last Pain:  Vitals:   08/05/16 1032  TempSrc: Oral         Complications: No apparent anesthesia complications

## 2016-08-05 NOTE — Anesthesia Preprocedure Evaluation (Addendum)
Anesthesia Evaluation  Patient identified by MRN, date of birth, ID band Patient awake    Reviewed: Allergy & Precautions, NPO status , Patient's Chart, lab work & pertinent test results  Airway Mallampati: III  TM Distance: >3 FB Neck ROM: Full    Dental no notable dental hx.    Pulmonary  Reactive airway disease   Pulmonary exam normal breath sounds clear to auscultation       Cardiovascular hypertension, Pt. on medications and Pt. on home beta blockers Normal cardiovascular exam+ dysrhythmias Atrial Fibrillation  Rhythm:Regular Rate:Normal  ECG: A-fib, rate 80 ECHO: - Left ventricle: There was mild hypertrophy of the posterior wall. Systolic function was normal. The estimated ejection fraction was in the range of 50% to 55%. - Mitral valve: Calcified annulus. There was mild regurgitation. - Left atrium: The atrium was moderately dilated. - Right ventricle: The cavity size was normal. Wall thickness was normal. Systolic function was normal. - Tricuspid valve: There was mild regurgitation.   Neuro/Psych Depression negative neurological ROS     GI/Hepatic Neg liver ROS, GERD  Medicated and Controlled,  Endo/Other  Hyperthyroidism   Renal/GU negative Renal ROS  negative genitourinary   Musculoskeletal negative musculoskeletal ROS (+)   Abdominal   Peds negative pediatric ROS (+)  Hematology negative hematology ROS (+)   Anesthesia Other Findings Obese, BMI 36 Hyperlipidemia  Reproductive/Obstetrics negative OB ROS                            Anesthesia Physical Anesthesia Plan  ASA: III  Anesthesia Plan: General   Post-op Pain Management:    Induction: Intravenous  Airway Management Planned: Simple Face Mask  Additional Equipment:   Intra-op Plan:   Post-operative Plan:   Informed Consent: I have reviewed the patients History and Physical, chart, labs and discussed the  procedure including the risks, benefits and alternatives for the proposed anesthesia with the patient or authorized representative who has indicated his/her understanding and acceptance.   Dental advisory given  Plan Discussed with: CRNA  Anesthesia Plan Comments:         Anesthesia Quick Evaluation

## 2016-08-05 NOTE — CV Procedure (Signed)
Electrical Cardioversion Procedure Note Ruhee Enck [id number] 1945/03/14  Procedure: Electrical Cardioversion Indications:  Atrial Fibrillation  Procedure Details Consent: Risks of procedure as well as the alternatives and risks of each were explained to the (patient/caregiver).  Consent for procedure obtained. Time Out: Verified patient identification, verified procedure, site/side was marked, verified correct patient position, special equipment/implants available, medications/allergies/relevent history reviewed, required imaging and test results available.  Performed  Patient placed on cardiac monitor, pulse oximetry, supplemental oxygen as necessary.  Sedation given: propfol Pacer pads placed anterior and posterior chest.  Cardioverted 2 time(s).  Cardioverted at 150J unsuccessful.  200J successful.  Evaluation Findings: Post procedure EKG shows: NSR Complications: None Patient did tolerate procedure well.   Skeet Latch, MD 08/05/2016, 12:46 PM

## 2016-08-05 NOTE — Anesthesia Postprocedure Evaluation (Signed)
Anesthesia Post Note  Patient: Martha Perkins  Procedure(s) Performed: Procedure(s) (LRB): CARDIOVERSION (N/A)  Patient location during evaluation: PACU Anesthesia Type: General Level of consciousness: awake and alert Pain management: pain level controlled Vital Signs Assessment: post-procedure vital signs reviewed and stable Respiratory status: spontaneous breathing, nonlabored ventilation, respiratory function stable and patient connected to nasal cannula oxygen Cardiovascular status: blood pressure returned to baseline and stable Postop Assessment: no signs of nausea or vomiting Anesthetic complications: no       Last Vitals:  Vitals:   08/05/16 1307 08/05/16 1316  BP: (!) 146/69 (!) 137/48  Pulse: (!) 58 (!) 55  Resp: 16 13  Temp:      Last Pain:  Vitals:   08/05/16 1032  TempSrc: Oral                 Ryan P Ellender

## 2016-08-05 NOTE — Discharge Instructions (Signed)
Electrical Cardioversion, Care After °This sheet gives you information about how to care for yourself after your procedure. Your health care provider may also give you more specific instructions. If you have problems or questions, contact your health care provider. °What can I expect after the procedure? °After the procedure, it is common to have: °· Some redness on the skin where the shocks were given. °Follow these instructions at home: °· Do not drive for 24 hours if you were given a medicine to help you relax (sedative). °· Take over-the-counter and prescription medicines only as told by your health care provider. °· Ask your health care provider how to check your pulse. Check it often. °· Rest for 48 hours after the procedure or as told by your health care provider. °· Avoid or limit your caffeine use as told by your health care provider. °Contact a health care provider if: °· You feel like your heart is beating too quickly or your pulse is not regular. °· You have a serious muscle cramp that does not go away. °Get help right away if: °· You have discomfort in your chest. °· You are dizzy or you feel faint. °· You have trouble breathing or you are short of breath. °· Your speech is slurred. °· You have trouble moving an arm or leg on one side of your body. °· Your fingers or toes turn cold or blue. °This information is not intended to replace advice given to you by your health care provider. Make sure you discuss any questions you have with your health care provider. °Document Released: 12/15/2012 Document Revised: 09/28/2015 Document Reviewed: 08/31/2015 °Elsevier Interactive Patient Education © 2017 [employer] ° °

## 2016-08-05 NOTE — Interval H&P Note (Signed)
History and Physical Interval Note:  08/05/2016 11:06 AM  Martha Perkins  has presented today for surgery, with the diagnosis of afib  The various methods of treatment have been discussed with the patient and family. After consideration of risks, benefits and other options for treatment, the patient has consented to  Procedure(s): CARDIOVERSION (N/A) as a surgical intervention .  The patient's history has been reviewed, patient examined, no change in status, stable for surgery.  I have reviewed the patient's chart and labs.  Questions were answered to the patient's satisfaction.     Skeet Latch, MD

## 2016-08-06 ENCOUNTER — Encounter (HOSPITAL_COMMUNITY): Payer: Self-pay | Admitting: Cardiovascular Disease

## 2016-08-19 ENCOUNTER — Telehealth: Payer: Self-pay | Admitting: Cardiovascular Disease

## 2016-08-19 NOTE — Telephone Encounter (Signed)
Lidocaine patches are fine with her meds.

## 2016-08-19 NOTE — Telephone Encounter (Signed)
New message   Pt c/o medication issue:  1. Name of Medication: lidocaine patches   2. How are you currently taking this medication (dosage and times per day)? 1 patch  3. Are you having a reaction (difficulty breathing--STAT)?   4. What is your medication issue? Pt is having mild back pain and wants to know if she can use those patches without interfere with any of her other medications that she is on.

## 2016-08-19 NOTE — Telephone Encounter (Signed)
Message routed to pharmacy staff for advice on medication/interactions

## 2016-08-19 NOTE — Telephone Encounter (Signed)
Patient called and notified OK to use patches.

## 2016-08-25 ENCOUNTER — Ambulatory Visit (INDEPENDENT_AMBULATORY_CARE_PROVIDER_SITE_OTHER): Payer: Medicare Other

## 2016-08-25 ENCOUNTER — Ambulatory Visit (INDEPENDENT_AMBULATORY_CARE_PROVIDER_SITE_OTHER): Payer: Medicare Other | Admitting: Sports Medicine

## 2016-08-25 ENCOUNTER — Encounter: Payer: Self-pay | Admitting: Sports Medicine

## 2016-08-25 DIAGNOSIS — N200 Calculus of kidney: Secondary | ICD-10-CM

## 2016-08-25 DIAGNOSIS — M5136 Other intervertebral disc degeneration, lumbar region: Secondary | ICD-10-CM

## 2016-08-25 DIAGNOSIS — M51369 Other intervertebral disc degeneration, lumbar region without mention of lumbar back pain or lower extremity pain: Secondary | ICD-10-CM

## 2016-08-25 DIAGNOSIS — I10 Essential (primary) hypertension: Secondary | ICD-10-CM | POA: Diagnosis not present

## 2016-08-25 DIAGNOSIS — M5116 Intervertebral disc disorders with radiculopathy, lumbar region: Secondary | ICD-10-CM | POA: Diagnosis not present

## 2016-08-25 DIAGNOSIS — I480 Paroxysmal atrial fibrillation: Secondary | ICD-10-CM | POA: Diagnosis not present

## 2016-08-25 MED ORDER — PREDNISONE 50 MG PO TABS: ORAL_TABLET | ORAL | 0 refills | Status: DC

## 2016-08-25 MED ORDER — ACETAMINOPHEN ER 650 MG PO TBCR: 650.0000 mg | EXTENDED_RELEASE_TABLET | Freq: Three times a day (TID) | ORAL | 3 refills | Status: DC

## 2016-08-25 NOTE — Progress Notes (Signed)
   Subjective:    I'm seeing this patient as a consultation for:  [name]  CC: Low back pain  HPI: For the past several months this pleasant 71 year old female has had pain that she localizes the left side of her low back with radiation down to the anterolateral knee but not to the feet, no paresthesias. Moderate, persistent, no bowel or bladder dysfunction, saddle numbness, constitutional symptoms, worse with sitting, flexion, Valsalva.  Past medical history:  Negative.  See flowsheet/record as well for more information.  Surgical history: Negative.  See flowsheet/record as well for more information.  Family history: Negative.  See flowsheet/record as well for more information.  Social history: Negative.  See flowsheet/record as well for more information.  Allergies, and medications have been entered into the medical record, reviewed, and no changes needed.   Review of Systems: No headache, visual changes, nausea, vomiting, diarrhea, constipation, dizziness, abdominal pain, skin rash, fevers, chills, night sweats, weight loss, swollen lymph nodes, body aches, joint swelling, muscle aches, chest pain, shortness of breath, mood changes, visual or auditory hallucinations.   Objective:   General: Well Developed, well nourished, and in no acute distress.  Neuro/Psych: Alert and oriented x3, extra-ocular muscles intact, able to move all 4 extremities, sensation grossly intact. Skin: Warm and dry, no rashes noted.  Respiratory: Not using accessory muscles, speaking in full sentences, trachea midline.  Cardiovascular: Pulses palpable, no extremity edema. Abdomen: Does not appear distended. Back Exam:  Inspection: Unremarkable  Motion: Flexion 45 deg, Extension 45 deg, Side Bending to 45 deg bilaterally,  Rotation to 45 deg bilaterally  SLR laying: Negative  XSLR laying: Negative  Palpable tenderness: None. FABER: negative. Sensory change: Gross sensation intact to all lumbar  and sacral dermatomes.  Reflexes: 2+ at both patellar tendons, 2+ at achilles tendons, Babinski's downgoing.  Strength at foot  Plantar-flexion: 5/5 Dorsi-flexion: 5/5 Eversion: 5/5 Inversion: 5/5  Leg strength  Quad: 5/5 Hamstring: 5/5 Hip flexor: 5/5 Hip abductors: 5/5  Gait unremarkable.  Impression and Recommendations:   This case required medical decision making of moderate complexity.  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 Physical therapy, prednisone, Tylenol arthritis strength, she is on Eliquis. Baseline x-rays. Return in one month.

## 2016-08-25 NOTE — Assessment & Plan Note (Signed)
Classic discogenic axial pain. Physical therapy, prednisone, Tylenol arthritis strength, she is on Eliquis. Baseline x-rays. Return in one month.

## 2016-08-26 LAB — LIPID PANEL W/REFLEX DIRECT LDL
Cholesterol: 172 mg/dL (ref <200)
HDL: 50 mg/dL — AB (ref >50)
LDL-Cholesterol: 97 mg/dL
Non-HDL Cholesterol (Calc): 122 mg/dL (ref <130)
Total CHOL/HDL Ratio: 3.4 Ratio (ref <5.0)
Triglycerides: 149 mg/dL (ref <150)

## 2016-08-26 LAB — COMPLETE METABOLIC PANEL WITH GFR
ALBUMIN: 4.4 g/dL (ref 3.6–5.1)
ALK PHOS: 119 U/L (ref 33–130)
ALT: 18 U/L (ref 6–29)
AST: 17 U/L (ref 10–35)
BILIRUBIN TOTAL: 1.2 mg/dL (ref 0.2–1.2)
BUN: 12 mg/dL (ref 7–25)
CALCIUM: 9.1 mg/dL (ref 8.6–10.4)
CO2: 26 mmol/L (ref 20–31)
Chloride: 105 mmol/L (ref 98–110)
Creat: 0.61 mg/dL (ref 0.50–0.99)
GLUCOSE: 95 mg/dL (ref 65–99)
Potassium: 3.9 mmol/L (ref 3.5–5.3)
SODIUM: 139 mmol/L (ref 135–146)
TOTAL PROTEIN: 6.6 g/dL (ref 6.1–8.1)

## 2016-08-28 ENCOUNTER — Telehealth: Payer: Self-pay | Admitting: *Deleted

## 2016-08-28 NOTE — Telephone Encounter (Signed)
Pt called in today stating that her back & left leg are in more pain now.  She is taking Prednisone, Tylenol arthritis, Tramadol, & using a lidocaine patch which does help.  She states that after a while she is able to move around and get things done but it's first thing in the morning that she's aftected the most.  PT can't get her in until next Thursday so I encouraged her to see if they could put her on a cancellation list for an earlier appt.  Also advised pt to continue medication regimen.  She just wanted you to know.

## 2016-08-28 NOTE — Telephone Encounter (Signed)
Stick with the prednisone, this will provide some relief.

## 2016-09-02 ENCOUNTER — Ambulatory Visit: Payer: Medicare Other | Attending: Sports Medicine | Admitting: Physical Therapy

## 2016-09-02 DIAGNOSIS — M545 Low back pain: Secondary | ICD-10-CM

## 2016-09-02 DIAGNOSIS — R293 Abnormal posture: Secondary | ICD-10-CM | POA: Insufficient documentation

## 2016-09-02 NOTE — Therapy (Deleted)
[facility] [place], [place], [address] Phone: [phone]   Fax:  [phone]  Physical Therapy Treatment  Patient Details  Name: Martha Perkins MRN: [id number] Date of Birth: April 12, 1945 Referring Provider: Aundria Mems MD  Encounter Date: 09/02/2016      PT End of Session - 09/02/16 1618    Visit Number 1   Number of Visits 12   Date for PT Re-Evaluation 10/14/16   PT Start Time 0145   PT Stop Time 0233   PT Time Calculation (min) 48 min   Activity Tolerance Patient tolerated treatment well   Behavior During Therapy [facility] for tasks assessed/performed      Past Medical History:  Diagnosis Date  . Depression   . Hyperlipidemia   . Hypertension   . Hyperthyroidism 08/09/2015  . Kidney stone   . Paroxysmal atrial fibrillation ([place]) 08/09/2015  . Thyroid disease     Past Surgical History:  Procedure Laterality Date  . CARDIOVERSION N/A 08/05/2016   Procedure: CARDIOVERSION;  Surgeon: Skeet Latch, MD;  Location: [facility];  Service: Cardiovascular;  Laterality: N/A;  . CATARACT EXTRACTION Left 09/2012   Dr. Bunnie Philips  . LITHOTRIPSY    . REPLACEMENT TOTAL KNEE BILATERAL  2011  . THYROIDECTOMY  1971  . TUBAL LIGATION  1979    There were no vitals filed for this visit.      Subjective Assessment - 09/02/16 1611    Subjective The patient reports a recent flare-up of low back pain one week ago.  She rates her pain at a 6/10 today.  She reports left sided low back pain with some radiationinto the left buttock and lateral thigh region.  Moving increases her pain and pain meds and heat reduce her pain.  An X-ray reveals:   Levoconvex scoliosis of the lumbar spine centered at L2-L3, multi-level DDD and facet arthropathy.             Limitations Standing;Sitting   How long can you sit comfortably? 10-15 minutes.   How long can you stand comfortably? 10 minutes.   Diagnostic tests X-ray.   Patient Stated  Goals Get out of pain.   Currently in Pain? Yes   Pain Score 6    Pain Location Back   Pain Orientation Left   Pain Descriptors / Indicators Aching   Pain Type Acute pain   Pain Onset 1 to 4 weeks ago   Pain Frequency Constant   Aggravating Factors  See above.   Pain Relieving Factors See above.            [facility] PT Assessment - 09/02/16 0001      Assessment   Medical Diagnosis Lumbar DDD.   Referring Provider Aundria Mems MD   Onset Date/Surgical Date --  One week.     Precautions   Precautions --  "Osteopenia."     Restrictions   Weight Bearing Restrictions No     Balance Screen   Has the patient fallen in the past 6 months No   Has the patient had a decrease in activity level because of a fear of falling?  No   Is the patient reluctant to leave their home because of a fear of falling?  No     Home Environment   Living Environment Private residence     Prior Function   Level of Independence Independent     Posture/Postural Control   Posture Comments Lumbar scoliosis observed.     ROM /  Strength   AROM / PROM / Strength AROM;Strength     AROM   Overall AROM Comments Lumbar flexion to 60 degrees and extension= 20 degrees.     Strength   Overall Strength Comments Nornal bilateral LE strength.     Palpation   Palpation comment Increased tone in left lower lumbar musculature and tender to palpation over left iliolumbar ligament.     Special Tests    Special Tests Lumbar;Leg LengthTest  Norm LE DTR's.(-) FABER testing.   Lumbar Tests --  (-)SLR testing but remarkable for tight hamstrings bil.   Leg length test  --  Equal leg lengths.     Ambulation/Gait   Gait Comments Mild gait antalgis upon initial rising from a seated position.                     [facility] Adult PT Treatment/Exercise - 09/02/16 0001      Exercises   Exercises Lumbar     Modalities   Modalities Electrical Stimulation;Moist Heat     Moist Heat Therapy    Number Minutes Moist Heat 20 Minutes   Moist Heat Location Lumbar Spine     Electrical Stimulation   Electrical Stimulation Location Left low back/SIJ region.   Electrical Stimulation Action Constant Pre-mod.   Electrical Stimulation Parameters 80-150 Hz x 20 minutes.   Electrical Stimulation Goals Tone;Pain                  PT Short Term Goals - 09/02/16 1640      PT SHORT TERM GOAL #1   Title STG's=LTG's.           PT Long Term Goals - 09/02/16 1640      PT LONG TERM GOAL #1   Title Independent with a HEP.   Time 6   Period Weeks   Status New     PT LONG TERM GOAL #2   Title Sit 30 minutes with pain not > 3/10.   Time 6   Period Weeks   Status New     PT LONG TERM GOAL #3   Title Stand 20 minutes with pain not > 3/10.   Time 6   Period Weeks   Status New     PT LONG TERM GOAL #4   Title Perform ADL's with pain not > 3/10.   Time 6   Period Weeks   Status New     PT LONG TERM GOAL #5   Title Eliminate left LE symptoms.   Time 6   Period Weeks   Status New               Plan - 09/02/16 1627    Clinical Impression Statement The pateint presents to OPPT with c/o left side low back pain with radiation into her left LE.  She has states when this flare-up occurred a week ago she was in severe pain and had to use a walker as she had little control of her left LE.  She is tender tender to palpation in her left low back/SIJ region.  She has a loss of range of motion.  Her deficits are impairing her functional mobility and ability to perform ADL's.  Patient will benefit from skilled physical to address deficits.   History and Personal Factors relevant to plan of care: H/o lumbar DDD and arthritis of spine.  Episodes of low back pain in past.   Clinical Presentation Evolving   Clinical Decision Making Low  Rehab Potential Good   PT Frequency 2x / week   PT Duration 6 weeks   PT Treatment/Interventions ADLs/Self Care Home Management;Electrical  Stimulation;Moist Heat;Ultrasound;Functional mobility training;Therapeutic activities;Therapeutic exercise;Manual techniques;Patient/family education;Dry needling   PT Next Visit Plan In right sdly position with pillow between knees:  Perform Combo e'stim/U/s to left low back/SIJ region f/b STW/M.  Begin core exercise progression.  Dry needling.   Consulted and Agree with Plan of Care Patient      Patient will benefit from skilled therapeutic intervention in order to improve the following deficits and impairments:  Decreased activity tolerance, Decreased range of motion, Postural dysfunction, Pain  Visit Diagnosis: Acute left-sided low back pain, with sciatica presence unspecified - Plan: PT plan of care cert/re-cert  Abnormal posture - Plan: PT plan of care cert/re-cert       G-Codes - 2016/09/29 1647    Mobility: Walking and Moving Around Goal Status [phone]) --      Problem List Patient Active Problem List   Diagnosis Date Noted  . Persistent atrial fibrillation ([place])   . Hyperthyroidism 02/18/2016  . Left shoulder pain 02/14/2016  . Restrictive lung disease 09/28/2015  . Lumbar degenerative disc disease 08/15/2015  . Paroxysmal atrial fibrillation ([place]) 08/09/2015  . Chronic allergic rhinitis 05/17/2014  . Injury of right rotator cuff 12/29/2013  . Cataract 06/03/2012  . Hyperlipidemia LDL goal <100 12/21/2010  . Hypertension 12/21/2010  . GERD (gastroesophageal reflux disease) 07/18/2010  . Insulin resistance 07/18/2010  . Hyperlipidemia 07/18/2010  . Fatty liver 07/18/2010    Serenity Fortner, [place] 29-Sep-2016, 4:49 PM  [facility] [address] [place], [place], [address] Phone: [phone]   Fax:  [phone]  Name: Thurza Kwiecinski MRN: [id number] Date of Birth: 01-25-1946

## 2016-09-02 NOTE — Therapy (Signed)
[facility] [place], [place], [address] Phone: [phone]   Fax:  [phone]  Physical Therapy Evaluation  Patient Details  Name: Martha Perkins MRN: [id number] Date of Birth: 10-21-1945 Referring Provider: Aundria Mems MD  Encounter Date: 09/02/2016      PT End of Session - 09/02/16 1618    Visit Number 1   Number of Visits 12   Date for PT Re-Evaluation 10/14/16   PT Start Time 0145   PT Stop Time 0233   PT Time Calculation (min) 48 min   Activity Tolerance Patient tolerated treatment well   Behavior During Therapy [facility] for tasks assessed/performed      Past Medical History:  Diagnosis Date  . Depression   . Hyperlipidemia   . Hypertension   . Hyperthyroidism 08/09/2015  . Kidney stone   . Paroxysmal atrial fibrillation ([place]) 08/09/2015  . Thyroid disease     Past Surgical History:  Procedure Laterality Date  . CARDIOVERSION N/A 08/05/2016   Procedure: CARDIOVERSION;  Surgeon: Skeet Latch, MD;  Location: [facility];  Service: Cardiovascular;  Laterality: N/A;  . CATARACT EXTRACTION Left 09/2012   Dr. Bunnie Philips  . LITHOTRIPSY    . REPLACEMENT TOTAL KNEE BILATERAL  2011  . THYROIDECTOMY  1971  . TUBAL LIGATION  1979    There were no vitals filed for this visit.       Subjective Assessment - 09/02/16 1611    Subjective The patient reports a recent flare-up of low back pain one week ago.  She rates her pain at a 6/10 today.  She reports left sided low back pain with some radiationinto the left buttock and lateral thigh region.  Moving increases her pain and pain meds and heat reduce her pain.  An X-ray reveals:   Levoconvex scoliosis of the lumbar spine centered at L2-L3, multi-level DDD and facet arthropathy.             Limitations Standing;Sitting   How long can you sit comfortably? 10-15 minutes.   How long can you stand comfortably? 10 minutes.   Diagnostic tests X-ray.   Patient Stated  Goals Get out of pain.   Currently in Pain? Yes   Pain Score 6    Pain Location Back   Pain Orientation Left   Pain Descriptors / Indicators Aching   Pain Type Acute pain   Pain Onset 1 to 4 weeks ago   Pain Frequency Constant   Aggravating Factors  See above.   Pain Relieving Factors See above.            [facility] PT Assessment - 09/02/16 0001      Assessment   Medical Diagnosis Lumbar DDD.   Referring Provider Aundria Mems MD   Onset Date/Surgical Date --  One week.     Precautions   Precautions --  "Osteopenia."     Restrictions   Weight Bearing Restrictions No     Balance Screen   Has the patient fallen in the past 6 months No   Has the patient had a decrease in activity level because of a fear of falling?  No   Is the patient reluctant to leave their home because of a fear of falling?  No     Home Environment   Living Environment Private residence     Prior Function   Level of Independence Independent     Posture/Postural Control   Posture Comments Lumbar scoliosis observed.  ROM / Strength   AROM / PROM / Strength AROM;Strength     AROM   Overall AROM Comments Lumbar flexion to 60 degrees and extension= 20 degrees.     Strength   Overall Strength Comments Nornal bilateral LE strength.     Palpation   Palpation comment Increased tone in left lower lumbar musculature and tender to palpation over left iliolumbar ligament.     Special Tests    Special Tests Lumbar;Leg LengthTest  Norm LE DTR's.(-) FABER testing.   Lumbar Tests --  (-)SLR testing but remarkable for tight hamstrings bil.   Leg length test  --  Equal leg lengths.     Ambulation/Gait   Gait Comments Mild gait antalgis upon initial rising from a seated position.            Objective measurements completed on examination: See above findings.          [facility] Adult PT Treatment/Exercise - 09/02/16 0001      Exercises   Exercises Lumbar     Modalities    Modalities Electrical Stimulation;Moist Heat     Moist Heat Therapy   Number Minutes Moist Heat 20 Minutes   Moist Heat Location Lumbar Spine     Electrical Stimulation   Electrical Stimulation Location Left low back/SIJ region.   Electrical Stimulation Action Constant Pre-mod.   Electrical Stimulation Parameters 80-150 Hz x 20 minutes.   Electrical Stimulation Goals Tone;Pain                  PT Short Term Goals - 09/02/16 1640      PT SHORT TERM GOAL #1   Title STG's=LTG's.           PT Long Term Goals - 09/02/16 1640      PT LONG TERM GOAL #1   Title Independent with a HEP.   Time 6   Period Weeks   Status New     PT LONG TERM GOAL #2   Title Sit 30 minutes with pain not > 3/10.   Time 6   Period Weeks   Status New     PT LONG TERM GOAL #3   Title Stand 20 minutes with pain not > 3/10.   Time 6   Period Weeks   Status New     PT LONG TERM GOAL #4   Title Perform ADL's with pain not > 3/10.   Time 6   Period Weeks   Status New     PT LONG TERM GOAL #5   Title Eliminate left LE symptoms.   Time 6   Period Weeks   Status New                Plan - 09/02/16 1627    Clinical Impression Statement The pateint presents to OPPT with c/o left side low back pain with radiation into her left LE.  She has states when this flare-up occurred a week ago she was in severe pain and had to use a walker as she had little control of her left LE.  She is tender tender to palpation in her left low back/SIJ region.  She has a loss of range of motion.  Her deficits are impairing her functional mobility and ability to perform ADL's.  Patient will benefit from skilled physical to address deficits.   History and Personal Factors relevant to plan of care: H/o lumbar DDD and arthritis of spine.  Episodes of low back pain in past.  Clinical Presentation Evolving   Clinical Decision Making Low   Rehab Potential Good   PT Frequency 2x / week   PT Duration 6 weeks    PT Treatment/Interventions ADLs/Self Care Home Management;Electrical Stimulation;Moist Heat;Ultrasound;Functional mobility training;Therapeutic activities;Therapeutic exercise;Manual techniques;Patient/family education;Dry needling   PT Next Visit Plan In right sdly position with pillow between knees:  Perform Combo e'stim/U/s to left low back/SIJ region f/b STW/M.  Begin core exercise progression.  Dry needling.   Consulted and Agree with Plan of Care Patient      Patient will benefit from skilled therapeutic intervention in order to improve the following deficits and impairments:  Decreased activity tolerance, Decreased range of motion, Postural dysfunction, Pain  Visit Diagnosis: Acute left-sided low back pain, with sciatica presence unspecified - Plan: PT plan of care cert/re-cert  Abnormal posture - Plan: PT plan of care cert/re-cert      G-Codes - 01/56/15 1647    Mobility: Walking and Moving Around Goal Status [phone]) --       Problem List Patient Active Problem List   Diagnosis Date Noted  . Persistent atrial fibrillation ([place])   . Hyperthyroidism 02/18/2016  . Left shoulder pain 02/14/2016  . Restrictive lung disease 09/28/2015  . Lumbar degenerative disc disease 08/15/2015  . Paroxysmal atrial fibrillation ([place]) 08/09/2015  . Chronic allergic rhinitis 05/17/2014  . Injury of right rotator cuff 12/29/2013  . Cataract 06/03/2012  . Hyperlipidemia LDL goal <100 12/21/2010  . Hypertension 12/21/2010  . GERD (gastroesophageal reflux disease) 07/18/2010  . Insulin resistance 07/18/2010  . Hyperlipidemia 07/18/2010  . Fatty liver 07/18/2010    Doris Mcgilvery, [place] MPT 09/02/2016, 4:50 PM  [facility] [place], [place], [address] Phone: [phone]   Fax:  [phone]  Name: Martha Perkins MRN: [id number] Date of Birth: [birth year]

## 2016-09-03 ENCOUNTER — Ambulatory Visit: Payer: Self-pay | Admitting: Physical Therapy

## 2016-09-04 ENCOUNTER — Encounter: Payer: Self-pay | Admitting: Physical Therapy

## 2016-09-08 ENCOUNTER — Ambulatory Visit: Payer: Medicare Other | Attending: Sports Medicine | Admitting: *Deleted

## 2016-09-08 DIAGNOSIS — M545 Low back pain: Secondary | ICD-10-CM | POA: Diagnosis present

## 2016-09-08 DIAGNOSIS — R293 Abnormal posture: Secondary | ICD-10-CM | POA: Diagnosis present

## 2016-09-08 DIAGNOSIS — G4452 New daily persistent headache (NDPH): Secondary | ICD-10-CM | POA: Diagnosis present

## 2016-09-08 NOTE — Therapy (Signed)
[facility] [place], [place], [address] Phone: [phone]   Fax:  [phone]  Physical Therapy Treatment  Patient Details  Name: Martha Perkins MRN: [id number] Date of Birth: 1945/04/08 Referring Provider: Aundria Mems MD  Encounter Date: 09/08/2016      PT End of Session - 09/08/16 1040    Visit Number 2   Number of Visits 12   Date for PT Re-Evaluation 10/14/16   PT Start Time 1030   PT Stop Time 1120   PT Time Calculation (min) 50 min      Past Medical History:  Diagnosis Date  . Depression   . Hyperlipidemia   . Hypertension   . Hyperthyroidism 08/09/2015  . Kidney stone   . Paroxysmal atrial fibrillation ([place]) 08/09/2015  . Thyroid disease     Past Surgical History:  Procedure Laterality Date  . CARDIOVERSION N/A 08/05/2016   Procedure: CARDIOVERSION;  Surgeon: Skeet Latch, MD;  Location: [facility];  Service: Cardiovascular;  Laterality: N/A;  . CATARACT EXTRACTION Left 09/2012   Dr. Bunnie Philips  . LITHOTRIPSY    . REPLACEMENT TOTAL KNEE BILATERAL  2011  . THYROIDECTOMY  1971  . TUBAL LIGATION  1979    There were no vitals filed for this visit.                       [facility] Adult PT Treatment/Exercise - 09/08/16 0001      Exercises   Exercises Lumbar     Modalities   Modalities Electrical Stimulation;Moist Heat;Ultrasound     Moist Heat Therapy   Number Minutes Moist Heat 15 Minutes   Moist Heat Location Lumbar Spine     Electrical Stimulation   Electrical Stimulation Location Left low back/SIJ region.  IFC  80-150hz  x 15 mins in hooklying   Electrical Stimulation Goals Tone;Pain     Ultrasound   Ultrasound Location Combo 1.5 w/cm2 x 10  mins  to LT SIJ area   Ultrasound Parameters RT sidelying   Ultrasound Goals Pain     Manual Therapy   Manual Therapy Soft tissue mobilization   Soft tissue mobilization STW to LT Lumbar paras, QL  and along SIJ with pt RT  sidelying.                  PT Short Term Goals - 09/02/16 1640      PT SHORT TERM GOAL #1   Title STG's=LTG's.           PT Long Term Goals - 09/02/16 1640      PT LONG TERM GOAL #1   Title Independent with a HEP.   Time 6   Period Weeks   Status New     PT LONG TERM GOAL #2   Title Sit 30 minutes with pain not > 3/10.   Time 6   Period Weeks   Status New     PT LONG TERM GOAL #3   Title Stand 20 minutes with pain not > 3/10.   Time 6   Period Weeks   Status New     PT LONG TERM GOAL #4   Title Perform ADL's with pain not > 3/10.   Time 6   Period Weeks   Status New     PT LONG TERM GOAL #5   Title Eliminate left LE symptoms.   Time 6   Period Weeks   Status New  Plan - 09/08/16 1158    Clinical Impression Statement Pt arrived to clinic  today feeling fairly well with lower pain levels LT side LB. She did well with [place] combo f/b STW to LT LB and SIJ area. She states that she has less soreness and pain today than last rx.   Clinical Presentation Evolving   Clinical Decision Making Low   Rehab Potential Good   PT Frequency 2x / week   PT Duration 6 weeks   PT Treatment/Interventions ADLs/Self Care Home Management;Electrical Stimulation;Moist Heat;Ultrasound;Functional mobility training;Therapeutic activities;Therapeutic exercise;Manual techniques;Patient/family education;Dry needling   PT Next Visit Plan In right sdly position with pillow between knees:  Perform Combo e'stim/U/s to left low back/SIJ region f/b STW/M.  Begin core exercise progression.  Dry needling.      Patient will benefit from skilled therapeutic intervention in order to improve the following deficits and impairments:  Decreased activity tolerance, Decreased range of motion, Postural dysfunction, Pain  Visit Diagnosis: Acute left-sided low back pain, with sciatica presence unspecified  Abnormal posture  New daily persistent headache     Problem  List Patient Active Problem List   Diagnosis Date Noted  . Persistent atrial fibrillation ([place])   . Hyperthyroidism 02/18/2016  . Left shoulder pain 02/14/2016  . Restrictive lung disease 09/28/2015  . Lumbar degenerative disc disease 08/15/2015  . Paroxysmal atrial fibrillation ([place]) 08/09/2015  . Chronic allergic rhinitis 05/17/2014  . Injury of right rotator cuff 12/29/2013  . Cataract 06/03/2012  . Hyperlipidemia LDL goal <100 12/21/2010  . Hypertension 12/21/2010  . GERD (gastroesophageal reflux disease) 07/18/2010  . Insulin resistance 07/18/2010  . Hyperlipidemia 07/18/2010  . Fatty liver 07/18/2010    RAMSEUR,CHRIS, PTA 09/08/2016, 12:01 PM  [facility] [place], [place], [address] Phone: [phone]   Fax:  [phone]  Name: Shaely Gadberry MRN: [id number] Date of Birth: 04-13-1945

## 2016-09-11 ENCOUNTER — Other Ambulatory Visit: Payer: Self-pay

## 2016-09-11 ENCOUNTER — Encounter: Payer: Self-pay | Admitting: Endocrinology

## 2016-09-11 ENCOUNTER — Ambulatory Visit (INDEPENDENT_AMBULATORY_CARE_PROVIDER_SITE_OTHER): Payer: Medicare Other | Admitting: Endocrinology

## 2016-09-11 ENCOUNTER — Encounter: Payer: Self-pay | Admitting: Physical Therapy

## 2016-09-11 ENCOUNTER — Ambulatory Visit: Payer: Medicare Other | Admitting: Physical Therapy

## 2016-09-11 DIAGNOSIS — R293 Abnormal posture: Secondary | ICD-10-CM | POA: Diagnosis not present

## 2016-09-11 DIAGNOSIS — M545 Low back pain: Secondary | ICD-10-CM

## 2016-09-11 DIAGNOSIS — G4452 New daily persistent headache (NDPH): Secondary | ICD-10-CM | POA: Diagnosis not present

## 2016-09-11 DIAGNOSIS — E039 Hypothyroidism, unspecified: Secondary | ICD-10-CM | POA: Insufficient documentation

## 2016-09-11 DIAGNOSIS — E890 Postprocedural hypothyroidism: Secondary | ICD-10-CM

## 2016-09-11 NOTE — Therapy (Signed)
[facility] [place], [place], [address] Phone: [phone]   Fax:  [phone]  Physical Therapy Treatment  Patient Details  Name: Martha Perkins MRN: [id number] Date of Birth: 03/23/1945 Referring Provider: Aundria Mems MD  Encounter Date: 09/11/2016      PT End of Session - 09/11/16 1535    Visit Number 3   Number of Visits 12   Date for PT Re-Evaluation 10/14/16   PT Start Time 0145   PT Stop Time 0240   PT Time Calculation (min) 55 min   Activity Tolerance Patient tolerated treatment well   Behavior During Therapy [facility] for tasks assessed/performed      Past Medical History:  Diagnosis Date  . Depression   . Hyperlipidemia   . Hypertension   . Hyperthyroidism 08/09/2015  . Kidney stone   . Paroxysmal atrial fibrillation ([place]) 08/09/2015  . Thyroid disease     Past Surgical History:  Procedure Laterality Date  . CARDIOVERSION N/A 08/05/2016   Procedure: CARDIOVERSION;  Surgeon: Skeet Latch, MD;  Location: [place];  Service: Cardiovascular;  Laterality: N/A;  . CATARACT EXTRACTION Left 09/2012   Dr. Bunnie Philips  . LITHOTRIPSY    . REPLACEMENT TOTAL KNEE BILATERAL  2011  . THYROIDECTOMY  1971  . TUBAL LIGATION  1979    There were no vitals filed for this visit.      Subjective Assessment - 09/11/16 1532    Subjective The last treatment helped a lot.   Pain Score 2    Pain Location Back   Pain Orientation Left   Pain Descriptors / Indicators Aching   Pain Onset 1 to 4 weeks ago                         [facility] Adult PT Treatment/Exercise - 09/11/16 0001      Modalities   Modalities Electrical Stimulation     Electrical Stimulation   Electrical Stimulation Location Left low back/SIJ region.   Electrical Stimulation Action Constant Pre-mod.   Electrical Stimulation Parameters 80-150 Hz x 20 minutes.   Electrical Stimulation Goals Tone;Pain     Ultrasound   Ultrasound  Location Combo e'stim/U/S at 1.50 W/CM2 x 12 minutes.   Ultrasound Parameters Right SDLY position with 2 pillows between knees for comfort.   Ultrasound Goals Pain     Manual Therapy   Manual Therapy Soft tissue mobilization   Soft tissue mobilization STW/M x 12 minutes to left low back musculature with QL release and SIJ x 12 minutes.                  PT Short Term Goals - 09/02/16 1640      PT SHORT TERM GOAL #1   Title STG's=LTG's.           PT Long Term Goals - 09/02/16 1640      PT LONG TERM GOAL #1   Title Independent with a HEP.   Time 6   Period Weeks   Status New     PT LONG TERM GOAL #2   Title Sit 30 minutes with pain not > 3/10.   Time 6   Period Weeks   Status New     PT LONG TERM GOAL #3   Title Stand 20 minutes with pain not > 3/10.   Time 6   Period Weeks   Status New     PT LONG TERM GOAL #4  Title Perform ADL's with pain not > 3/10.   Time 6   Period Weeks   Status New     PT LONG TERM GOAL #5   Title Eliminate left LE symptoms.   Time 6   Period Weeks   Status New               Plan - 09/11/16 1536    Clinical Impression Statement Excellent response to last 2 treatments.  Patient essentially pain-free after treatment today.      Patient will benefit from skilled therapeutic intervention in order to improve the following deficits and impairments:  Decreased activity tolerance, Decreased range of motion, Postural dysfunction, Pain  Visit Diagnosis: Acute left-sided low back pain, with sciatica presence unspecified  Abnormal posture     Problem List Patient Active Problem List   Diagnosis Date Noted  . Hypothyroidism 09/11/2016  . Persistent atrial fibrillation ([place])   . Left shoulder pain 02/14/2016  . Restrictive lung disease 09/28/2015  . Lumbar degenerative disc disease 08/15/2015  . Paroxysmal atrial fibrillation ([place]) 08/09/2015  . Chronic allergic rhinitis 05/17/2014  . Injury of right rotator cuff  12/29/2013  . Cataract 06/03/2012  . Hyperlipidemia LDL goal <100 12/21/2010  . Hypertension 12/21/2010  . GERD (gastroesophageal reflux disease) 07/18/2010  . Insulin resistance 07/18/2010  . Hyperlipidemia 07/18/2010  . Fatty liver 07/18/2010    Dally Oshel, [place] MPT 09/11/2016, 3:37 PM  [facility] [address] [place], [place], [address] Phone: [phone]   Fax:  [phone]  Name: Martha Perkins MRN: [id number] Date of Birth: Dec 03, 1945

## 2016-09-11 NOTE — Patient Instructions (Signed)
A thyroid blood test is requested for you today.  We'll let you know about the results.   Please come back for a follow-up appointment in 2 months.

## 2016-09-11 NOTE — Progress Notes (Signed)
Subjective:    Patient ID: Martha Perkins, female    DOB: Jan 22, 1946, 70 y.o.   MRN: [id number]  HPI Pt returns for f/u of hyperthyroidism (dx'ed April, 2017, in w/u of AF; she had partial right thyroid lobectomy in 1970; she was rx'ed tapazole and toprol, due to the need for prompt improvement; she had 2 doses of RAI (July of 2017 and Jan of 2018; she started synthroid in May of 2018).  pt states she feels well in general.  She stopped biotin 5 days ago.   Past Medical History:  Diagnosis Date  . Depression   . Hyperlipidemia   . Hypertension   . Hyperthyroidism 08/09/2015  . Kidney stone   . Paroxysmal atrial fibrillation ([place]) 08/09/2015  . Thyroid disease     Past Surgical History:  Procedure Laterality Date  . CARDIOVERSION N/A 08/05/2016   Procedure: CARDIOVERSION;  Surgeon: Skeet Latch, MD;  Location: [place];  Service: Cardiovascular;  Laterality: N/A;  . CATARACT EXTRACTION Left 09/2012   Dr. Bunnie Philips  . LITHOTRIPSY    . REPLACEMENT TOTAL KNEE BILATERAL  2011  . THYROIDECTOMY  1971  . TUBAL LIGATION  1979    Social History   Social History  . Marital status: Married    Spouse name: N/A  . Number of children: N/A  . Years of education: N/A   Occupational History  . Retired Therapist, sports.     Social History Main Topics  . Smoking status: Never Smoker  . Smokeless tobacco: Never Used  . Alcohol use No  . Drug use: No  . Sexual activity: Not on file   Other Topics Concern  . Not on file   Social History Narrative  . No narrative on file    Current Outpatient Prescriptions on File Prior to Visit  Medication Sig Dispense Refill  . acetaminophen (TYLENOL) 650 MG CR tablet Take 1 tablet (650 mg total) by mouth every 8 (eight) hours. 90 tablet 3  . albuterol (PROAIR HFA) 108 (90 Base) MCG/ACT inhaler Inhale 2 puffs into the lungs every 6 (six) hours as needed for wheezing or shortness of breath. 18 each 3  . atorvastatin (LIPITOR) 40 MG tablet TAKE 1 TABLET BY  MOUTH  DAILY 90 tablet 3  . diltiazem (CARDIZEM CD) 240 MG 24 hr capsule Take 1 capsule (240 mg total) by mouth daily. 90 capsule 3  . ELIQUIS 5 MG TABS tablet TAKE ONE TABLET BY MOUTH TWICE DAILY 60 tablet 5  . esomeprazole (NEXIUM) 40 MG capsule Take 1 capsule (40 mg total) by mouth daily. 90 capsule 0  . fluticasone (FLONASE) 50 MCG/ACT nasal spray USE 1 SPRAY IN EACH NOSTRIL TWO TIMES DAILY 48 g 1  . furosemide (LASIX) 20 MG tablet Take 1 tablet (20 mg total) by mouth daily as needed (SWELLING). 90 tablet 1  . ketoconazole (NIZORAL) 2 % cream APPLY TOPICALLY DAILY AS  NEEDED FOR IRRITATION. 60 g 1  . levothyroxine (SYNTHROID, LEVOTHROID) 50 MCG tablet Take 1 tablet (50 mcg total) by mouth daily before breakfast. 30 tablet 5  . metoprolol succinate (TOPROL-XL) 100 MG 24 hr tablet Take 100 mg by mouth every evening. Take with or immediately following a meal.    . traMADol (ULTRAM) 50 MG tablet Take 1 tablet (50 mg total) by mouth 3 (three) times daily as needed. 270 tablet 0   No current facility-administered medications on file prior to visit.     Allergies  Allergen Reactions  . Demerol  Nausea Only  . Meperidine Hcl Other (See Comments)    Feels loopy  . Oxycodone-Aspirin Other (See Comments)    Feels loopy  . Percocet [Oxycodone-Acetaminophen] Other (See Comments)    Feels loopy    Family History  Problem Relation Age of Onset  . Coronary artery disease Mother 80       deceased  . Depression Mother   . Hyperlipidemia Mother   . Diabetes Paternal Grandmother   . Thyroid disease Sister     BP 137/82   Pulse (!) 57   Ht 5\' 2"  (1.575 m)   Wt 199 lb (90.3 kg)   SpO2 95%   BMI 36.40 kg/m    Review of Systems No change in chronic leg edema.      Objective:   Physical Exam VITAL SIGNS:  See vs page GENERAL: no distress NECK: a healed scar is present.  left thyroid mass is again noted.  approx 4 cm diameter.   Neuro: no tremor.  Skin: not diaphoretic.  PSYCH: Does  not appear anxious nor depressed.        Assessment & Plan:  Post-RAI hypothyroidism: due for recheck.    Patient Instructions  A thyroid blood test is requested for you today.  We'll let you know about the results.   Please come back for a follow-up appointment in 2 months.

## 2016-09-12 ENCOUNTER — Other Ambulatory Visit: Payer: Self-pay | Admitting: Endocrinology

## 2016-09-12 LAB — T4, FREE: Free T4: 0.96 ng/dL (ref 0.82–1.77)

## 2016-09-12 LAB — TSH: TSH: 20.36 u[IU]/mL — ABNORMAL HIGH (ref 0.450–4.500)

## 2016-09-12 MED ORDER — LEVOTHYROXINE SODIUM 112 MCG PO TABS: 112.0000 ug | ORAL_TABLET | Freq: Every day | ORAL | 5 refills | Status: DC

## 2016-09-15 ENCOUNTER — Ambulatory Visit: Payer: Medicare Other | Admitting: *Deleted

## 2016-09-15 DIAGNOSIS — R293 Abnormal posture: Secondary | ICD-10-CM

## 2016-09-15 DIAGNOSIS — M545 Low back pain: Secondary | ICD-10-CM

## 2016-09-15 DIAGNOSIS — G4452 New daily persistent headache (NDPH): Secondary | ICD-10-CM | POA: Diagnosis not present

## 2016-09-15 NOTE — Therapy (Signed)
[facility] [place], [place], [address] Phone: [phone]   Fax:  [phone]  Physical Therapy Treatment  Patient Details  Name: Martha Perkins MRN: [id number] Date of Birth: 1946-01-31 Referring Provider: Aundria Mems MD  Encounter Date: 09/15/2016      PT End of Session - 09/15/16 1114    Visit Number 4   Number of Visits 12   Date for PT Re-Evaluation 10/14/16   PT Start Time 1030   PT Stop Time 1122   PT Time Calculation (min) 52 min      Past Medical History:  Diagnosis Date  . Depression   . Hyperlipidemia   . Hypertension   . Hyperthyroidism 08/09/2015  . Kidney stone   . Paroxysmal atrial fibrillation ([place]) 08/09/2015  . Thyroid disease     Past Surgical History:  Procedure Laterality Date  . CARDIOVERSION N/A 08/05/2016   Procedure: CARDIOVERSION;  Surgeon: Skeet Latch, MD;  Location: [place];  Service: Cardiovascular;  Laterality: N/A;  . CATARACT EXTRACTION Left 09/2012   Dr. Bunnie Philips  . LITHOTRIPSY    . REPLACEMENT TOTAL KNEE BILATERAL  2011  . THYROIDECTOMY  1971  . TUBAL LIGATION  1979    There were no vitals filed for this visit.      Subjective Assessment - 09/15/16 1037    Subjective The last treatment helped a lot.   Limitations Standing;Sitting   How long can you sit comfortably? 10-15 minutes.   How long can you stand comfortably? 10 minutes.   Diagnostic tests X-ray.   Patient Stated Goals Get out of pain.   Currently in Pain? Yes   Pain Score 3    Pain Location Back   Pain Orientation Left   Pain Descriptors / Indicators Aching   Pain Type Acute pain   Pain Onset 1 to 4 weeks ago                         [facility] Adult PT Treatment/Exercise - 09/15/16 0001      Modalities   Modalities Electrical Stimulation;Ultrasound     Moist Heat Therapy   Number Minutes Moist Heat 15 Minutes   Moist Heat Location Lumbar Spine     Electrical  Stimulation   Electrical Stimulation Location Left low back/SIJ region.  IFC  80-150hz  x 15 mins in hooklying   Electrical Stimulation Goals Tone;Pain     Ultrasound   Ultrasound Location [place] combo 1.5 w/cm2 x 10 mins to LT SIJ area   Ultrasound Parameters RT sidelying   Ultrasound Goals Pain     Manual Therapy   Manual Therapy Soft tissue mobilization   Soft tissue mobilization STW to LT Lumbar paras, QL  and along SIJ with pt RT sidelying.                  PT Short Term Goals - 09/02/16 1640      PT SHORT TERM GOAL #1   Title STG's=LTG's.           PT Long Term Goals - 09/02/16 1640      PT LONG TERM GOAL #1   Title Independent with a HEP.   Time 6   Period Weeks   Status New     PT LONG TERM GOAL #2   Title Sit 30 minutes with pain not > 3/10.   Time 6   Period Weeks   Status New  PT LONG TERM GOAL #3   Title Stand 20 minutes with pain not > 3/10.   Time 6   Period Weeks   Status New     PT LONG TERM GOAL #4   Title Perform ADL's with pain not > 3/10.   Time 6   Period Weeks   Status New     PT LONG TERM GOAL #5   Title Eliminate left LE symptoms.   Time 6   Period Weeks   Status New               Plan - 09/15/16 1122    Clinical Impression Statement Pt arrived to clinic today doing fairly well with LBP3-4/10 at times.  She is sore the next day after Rx, but then feels better. She had less tenderness along LT iliolumbar ligament today and had a normal response to modalities.. We will start low level core exs next Rx.   Clinical Decision Making Low   Rehab Potential Good   PT Frequency 2x / week   PT Duration 6 weeks   PT Treatment/Interventions ADLs/Self Care Home Management;Electrical Stimulation;Moist Heat;Ultrasound;Functional mobility training;Therapeutic activities;Therapeutic exercise;Manual techniques;Patient/family education;Dry needling   PT Next Visit Plan In right sdly position with pillow between knees:  Perform Combo  e'stim/U/s to left low back/SIJ region f/b STW/M.  Begin core exercise progression.  Dry needling.   Consulted and Agree with Plan of Care Patient      Patient will benefit from skilled therapeutic intervention in order to improve the following deficits and impairments:  Decreased activity tolerance, Decreased range of motion, Postural dysfunction, Pain  Visit Diagnosis: Acute left-sided low back pain, with sciatica presence unspecified  Abnormal posture     Problem List Patient Active Problem List   Diagnosis Date Noted  . Hypothyroidism 09/11/2016  . Persistent atrial fibrillation ([place])   . Left shoulder pain 02/14/2016  . Restrictive lung disease 09/28/2015  . Lumbar degenerative disc disease 08/15/2015  . Paroxysmal atrial fibrillation ([place]) 08/09/2015  . Chronic allergic rhinitis 05/17/2014  . Injury of right rotator cuff 12/29/2013  . Cataract 06/03/2012  . Hyperlipidemia LDL goal <100 12/21/2010  . Hypertension 12/21/2010  . GERD (gastroesophageal reflux disease) 07/18/2010  . Insulin resistance 07/18/2010  . Hyperlipidemia 07/18/2010  . Fatty liver 07/18/2010    Mignonne Afonso,CHRIS, PTA 09/15/2016, 11:32 AM  [facility] [place], [place], [address] Phone: [phone]   Fax:  [phone]  Name: Shatisha Falter MRN: [id number] Date of Birth: 1946/03/05

## 2016-09-18 ENCOUNTER — Ambulatory Visit (INDEPENDENT_AMBULATORY_CARE_PROVIDER_SITE_OTHER): Payer: Medicare Other | Admitting: Family Medicine

## 2016-09-18 ENCOUNTER — Ambulatory Visit: Payer: Medicare Other | Admitting: Physical Therapy

## 2016-09-18 ENCOUNTER — Encounter: Payer: Self-pay | Admitting: Physical Therapy

## 2016-09-18 ENCOUNTER — Encounter: Payer: Self-pay | Admitting: Family Medicine

## 2016-09-18 VITALS — BP 126/32 | HR 66 | Temp 98.0°F | Ht 62.0 in | Wt 197.0 lb

## 2016-09-18 DIAGNOSIS — L03116 Cellulitis of left lower limb: Secondary | ICD-10-CM | POA: Diagnosis not present

## 2016-09-18 DIAGNOSIS — G4452 New daily persistent headache (NDPH): Secondary | ICD-10-CM | POA: Diagnosis not present

## 2016-09-18 DIAGNOSIS — M545 Low back pain: Secondary | ICD-10-CM | POA: Diagnosis not present

## 2016-09-18 DIAGNOSIS — R293 Abnormal posture: Secondary | ICD-10-CM

## 2016-09-18 DIAGNOSIS — R001 Bradycardia, unspecified: Secondary | ICD-10-CM | POA: Diagnosis not present

## 2016-09-18 MED ORDER — CEPHALEXIN 500 MG PO CAPS: 500.0000 mg | ORAL_CAPSULE | Freq: Three times a day (TID) | ORAL | 0 refills | Status: DC

## 2016-09-18 NOTE — Therapy (Signed)
[facility] [place], [place], [address] Phone: [phone]   Fax:  [phone]  Physical Therapy Treatment  Patient Details  Name: Martha Perkins MRN: [id number] Date of Birth: May 22, 1945 Referring Provider: Aundria Mems MD  Encounter Date: 09/18/2016      PT End of Session - 09/18/16 1509    Visit Number 5   Number of Visits 12   Date for PT Re-Evaluation 10/14/16   PT Start Time 0145   PT Stop Time 0233   PT Time Calculation (min) 48 min   Activity Tolerance Patient tolerated treatment well   Behavior During Therapy [facility] for tasks assessed/performed      Past Medical History:  Diagnosis Date  . Depression   . Hyperlipidemia   . Hypertension   . Hyperthyroidism 08/09/2015  . Kidney stone   . Paroxysmal atrial fibrillation ([place]) 08/09/2015  . Thyroid disease     Past Surgical History:  Procedure Laterality Date  . CARDIOVERSION N/A 08/05/2016   Procedure: CARDIOVERSION;  Surgeon: Skeet Latch, MD;  Location: [place];  Service: Cardiovascular;  Laterality: N/A;  . CATARACT EXTRACTION Left 09/2012   Dr. Bunnie Philips  . LITHOTRIPSY    . REPLACEMENT TOTAL KNEE BILATERAL  2011  . THYROIDECTOMY  1971  . TUBAL LIGATION  1979    There were no vitals filed for this visit.      Subjective Assessment - 09/18/16 1354    Subjective I'm doing much better.  Today will be my last visit.   Pain Score 2    Pain Location Back   Pain Orientation Left   Pain Descriptors / Indicators Aching   Pain Type Acute pain   Pain Onset 1 to 4 weeks ago                         [facility] Adult PT Treatment/Exercise - 09/18/16 0001      Modalities   Modalities Electrical Stimulation;Moist Heat     Moist Heat Therapy   Number Minutes Moist Heat 15 Minutes   Moist Heat Location Lumbar Spine     Electrical Stimulation   Electrical Stimulation Location Left low back/SIJ.   Electrical Stimulation Action  Constant Pre-mod.   Electrical Stimulation Parameters 80-150 Hz x 15 minutes.   Electrical Stimulation Goals Tone;Pain     Ultrasound   Ultrasound Location U/S at 1.50 w/CM2 x 12 minutes.   Ultrasound Goals Pain     Manual Therapy   Manual Therapy Soft tissue mobilization   Soft tissue mobilization STW/M and left QL release x 11 minutes.                  PT Short Term Goals - 09/02/16 1640      PT SHORT TERM GOAL #1   Title STG's=LTG's.           PT Long Term Goals - 09/02/16 1640      PT LONG TERM GOAL #1   Title Independent with a HEP.   Time 6   Period Weeks   Status New     PT LONG TERM GOAL #2   Title Sit 30 minutes with pain not > 3/10.   Time 6   Period Weeks   Status New     PT LONG TERM GOAL #3   Title Stand 20 minutes with pain not > 3/10.   Time 6   Period Weeks   Status New  PT LONG TERM GOAL #4   Title Perform ADL's with pain not > 3/10.   Time 6   Period Weeks   Status New     PT LONG TERM GOAL #5   Title Eliminate left LE symptoms.   Time 6   Period Weeks   Status New               Plan - 09/18/16 1512    Clinical Impression Statement No pain after treatment.  Patient requesting to be put on hold to see if her pain stays low.      Patient will benefit from skilled therapeutic intervention in order to improve the following deficits and impairments:  Decreased activity tolerance, Decreased range of motion, Postural dysfunction, Pain  Visit Diagnosis: Acute left-sided low back pain, with sciatica presence unspecified  Abnormal posture     Problem List Patient Active Problem List   Diagnosis Date Noted  . Hypothyroidism 09/11/2016  . Persistent atrial fibrillation ([place])   . Left shoulder pain 02/14/2016  . Restrictive lung disease 09/28/2015  . Lumbar degenerative disc disease 08/15/2015  . Paroxysmal atrial fibrillation ([place]) 08/09/2015  . Chronic allergic rhinitis 05/17/2014  . Injury of right rotator  cuff 12/29/2013  . Cataract 06/03/2012  . Hyperlipidemia LDL goal <100 12/21/2010  . Hypertension 12/21/2010  . GERD (gastroesophageal reflux disease) 07/18/2010  . Insulin resistance 07/18/2010  . Hyperlipidemia 07/18/2010  . Fatty liver 07/18/2010    Ashaki Frosch, [place] MPT 09/18/2016, 3:13 PM  [facility] [address] [place], [place], [address] Phone: [phone]   Fax:  [phone]  Name: Martha Perkins MRN: [id number] Date of Birth: [birth year]

## 2016-09-18 NOTE — Progress Notes (Signed)
   Subjective:    Patient ID: Martha Perkins, female    DOB: Apr 27, 1945, 71 y.o.   MRN: [id number]  HPI  71 year old female comes in today because she's worried about an infection on her left lower leg. She says she often has dry skin on her lower legs. She forgets to moisturize them and will find herself scratching. This time she scratched so hard she actually broke the skin and now over the last 2-3 days it's been red and swollen and irritated. No fevers chills or sweats.  She also complains about a low pulse. She finally had the ablation for her atrial fibrillation. She's done well since then but has noticed that her pulse is been mostly running in the 50s. Today her diastolic pressure is very low. They did not adjust any of her medications.   Review of Systems     Objective:   Physical Exam  Constitutional: She is oriented to person, place, and time. She appears well-developed and well-nourished.  HENT:  Head: Normocephalic and atraumatic.  Eyes: Conjunctivae and EOM are normal.  Cardiovascular: Normal rate.   Pulmonary/Chest: Effort normal.  Neurological: She is alert and oriented to person, place, and time.  Skin: Skin is dry. No pallor.  She has some mild erythema over both anterior lower legs. On the left leg she has about 4 scabs with some mild increased erythema and warmth around this.   Psychiatric: She has a normal mood and affect. Her behavior is normal.  Vitals reviewed.         Assessment & Plan:  Early cellulitis - Encouraged her to moisturize regularly to reduce itching and scratching. Will put her on Keflex for 5 days as I do think [facility] and section/inflammation is mild. If getting worse or not improving or develops a fever them please let me know.  Low diastialic pressure-she does have a follow-up cardiology next week so encouraged her to discuss with them possibly decreasing her beta blocker. Also her diastolic is very low-dose I think backing off on her blood  pressure medication would be helpful. I did encourage her to hydrate well until she sees them next week.

## 2016-09-23 ENCOUNTER — Encounter: Payer: Self-pay | Admitting: Cardiovascular Disease

## 2016-09-23 ENCOUNTER — Ambulatory Visit (INDEPENDENT_AMBULATORY_CARE_PROVIDER_SITE_OTHER): Payer: Medicare Other | Admitting: Cardiovascular Disease

## 2016-09-23 VITALS — BP 134/80 | HR 58 | Ht 62.0 in | Wt 198.0 lb

## 2016-09-23 DIAGNOSIS — E785 Hyperlipidemia, unspecified: Secondary | ICD-10-CM

## 2016-09-23 DIAGNOSIS — I481 Persistent atrial fibrillation: Secondary | ICD-10-CM | POA: Diagnosis not present

## 2016-09-23 DIAGNOSIS — I10 Essential (primary) hypertension: Secondary | ICD-10-CM

## 2016-09-23 DIAGNOSIS — I4819 Other persistent atrial fibrillation: Secondary | ICD-10-CM

## 2016-09-23 NOTE — Addendum Note (Signed)
Addended by: Zebedee Iba on: 09/23/2016 11:53 AM   Modules accepted: Orders

## 2016-09-23 NOTE — Patient Instructions (Signed)
Follow-up in one year.

## 2016-09-23 NOTE — Addendum Note (Signed)
Addended by: Zebedee Iba on: 09/23/2016 11:54 AM   Modules accepted: Orders

## 2016-09-23 NOTE — Progress Notes (Signed)
Cardiology Office Note   Date:  09/23/2016   ID:  Martha Perkins, DOB April 26, 1945, MRN [id number]  PCP:  Hali Marry, MD  Cardiologist:   Skeet Latch, MD   Chief Complaint  Patient presents with  . Edema    pt states both feet      History of Present Illness: Martha Perkins is a 71 y.o. female with paroxysmal atrial fibrillation, Grave's disease, hypertension, hyperlipidemia, and fatty liver disease who presents follow up on atrial fibrillation.  Martha Perkins was seen by her PCP, [name], on 07/13/15 for newly-diagnosed atrial fibrillation.  She was started on Eliquis and a low-dose beta blocker. Lab work revealed that she was hyperthyroid.  She had a thyroid uptake scan that revealed an enlarged, hyperfunctioning left lobe of the thyroid gland without discrete nodule. Findings were thought to be consistent with Graves' disease. She also underwent radioactive iodine therapy. She was seen in clinic 08/09/15 and metoprolol was increased.  Lisinopril was discontinued. She had an echo 08/2015 that revealed LVEF 50-55% with moderate left atrial enlargement, mild MR and TR.    Since her last appointment Martha Perkins underwent cardioversion on 08/05/16.  She converted to attempts.  Her most recent labs show that she is now hypothyroid with a TSH of 20 on 09/11/16.  Martha Perkins  Has been feeling well. She fees a little less  Fatigued since her cardioversion. She's had a couple episodes of brief palpitations but no sustained arrhythmias.  She denies chest pain or shortness of breath.  She has been feeling stressed lately because a friend is in the hospital.  She is otherwise well.   She saw her PCP recently and noted that her heart rate was low. She denies lightheadedness or dizziness. Her blood pressure is always high at the doctor's office but she reports that it is typically low at home.  However, she has not been checking it recently.   Past Medical History:  Diagnosis Date  .  Depression   . Hyperlipidemia   . Hypertension   . Hyperthyroidism 08/09/2015  . Kidney stone   . Paroxysmal atrial fibrillation ([place]) 08/09/2015  . Thyroid disease     Past Surgical History:  Procedure Laterality Date  . CARDIOVERSION N/A 08/05/2016   Procedure: CARDIOVERSION;  Surgeon: Skeet Latch, MD;  Location: [place];  Service: Cardiovascular;  Laterality: N/A;  . CATARACT EXTRACTION Left 09/2012   Dr. Bunnie Philips  . LITHOTRIPSY    . REPLACEMENT TOTAL KNEE BILATERAL  2011  . THYROIDECTOMY  1971  . TUBAL LIGATION  1979     Current Outpatient Prescriptions  Medication Sig Dispense Refill  . acetaminophen (TYLENOL) 650 MG CR tablet Take 1 tablet (650 mg total) by mouth every 8 (eight) hours. 90 tablet 3  . albuterol (PROAIR HFA) 108 (90 Base) MCG/ACT inhaler Inhale 2 puffs into the lungs every 6 (six) hours as needed for wheezing or shortness of breath. 18 each 3  . atorvastatin (LIPITOR) 40 MG tablet TAKE 1 TABLET BY MOUTH  DAILY 90 tablet 3  . cephALEXin (KEFLEX) 500 MG capsule Take 1 capsule (500 mg total) by mouth 3 (three) times daily. 15 capsule 0  . diltiazem (CARDIZEM CD) 240 MG 24 hr capsule Take 1 capsule (240 mg total) by mouth daily. 90 capsule 3  . ELIQUIS 5 MG TABS tablet TAKE ONE TABLET BY MOUTH TWICE DAILY 60 tablet 5  . esomeprazole (NEXIUM) 40 MG capsule Take 1 capsule (40 mg  total) by mouth daily. 90 capsule 0  . fluticasone (FLONASE) 50 MCG/ACT nasal spray USE 1 SPRAY IN EACH NOSTRIL TWO TIMES DAILY 48 g 1  . furosemide (LASIX) 20 MG tablet Take 1 tablet (20 mg total) by mouth daily as needed (SWELLING). 90 tablet 1  . ketoconazole (NIZORAL) 2 % cream APPLY TOPICALLY DAILY AS  NEEDED FOR IRRITATION. 60 g 1  . levothyroxine (SYNTHROID, LEVOTHROID) 112 MCG tablet Take 1 tablet (112 mcg total) by mouth daily before breakfast. 30 tablet 5  . metoprolol succinate (TOPROL-XL) 100 MG 24 hr tablet Take 100 mg by mouth every evening. Take with or immediately  following a meal.    . traMADol (ULTRAM) 50 MG tablet Take 1 tablet (50 mg total) by mouth 3 (three) times daily as needed. 270 tablet 0   No current facility-administered medications for this visit.     Allergies:   Demerol; Meperidine hcl; Oxycodone-aspirin; and Percocet [oxycodone-acetaminophen]    Social History:  The patient  reports that she has never smoked. She has never used smokeless tobacco. She reports that she does not drink alcohol or use drugs.   Family History:  The patient's family history includes Coronary artery disease (age of onset: 75) in her mother; Depression in her mother; Diabetes in her paternal grandmother; Hyperlipidemia in her mother; Thyroid disease in her sister.    ROS:  Please see the history of present illness.   Otherwise, review of systems are positive for none.   All other systems are reviewed and negative.    PHYSICAL EXAM: VS:  BP 134/80   Pulse (!) 58   Ht 5\' 2"  (1.575 m)   Wt 89.8 kg (198 lb)   BMI 36.21 kg/m  , BMI Body mass index is 36.21 kg/m. GENERAL:  Well appearing.  No acute distress HEENT:  Pupils equal round and reactive, fundi not visualized, oral mucosa unremarkable NECK:  No jugular venous distention, waveform within normal limits, carotid upstroke brisk and symmetric, no bruits LUNGS:  Clear to auscultation bilaterally HEART:  RRR.  PMI not displaced or sustained,S1 and S2 within normal limits, no S3, no S4, no clicks, no rubs, I/VI systolic murmur at  ABD:  Flat, positive bowel sounds normal in frequency in pitch, no bruits, no rebound, no guarding, no midline pulsatile mass, no hepatomegaly, no splenomegaly EXT:  2 plus pulses throughout, no edema, no cyanosis no clubbing SKIN:  No rashes no nodules NEURO:  Cranial nerves II through XII grossly intact, motor grossly intact throughout PSYCH:  Cognitively intact, oriented to person place and time   EKG:  EKG is ordered today. The ekg ordered 12/11/15 demonstatrial  fibrillation. Rate 64 bpm. PVC. Prior septal infarct.  Right axis deviation. 07/18/16: Atrial fibrillation.  Rate 80 bpm.    09/23/16:  Sinus bradycardia. Rate 58 bpm. Septal infarct.  Recent Labs: 12/10/2015: Platelets 216 08/05/2016: Hemoglobin 13.6 08/25/2016: ALT 18; BUN 12; Creat 0.61; Potassium 3.9; Sodium 139 09/11/2016: TSH 20.360    Lipid Panel    Component Value Date/Time   CHOL 172 08/25/2016 1020   TRIG 149 08/25/2016 1020   HDL 50 (L) 08/25/2016 1020   CHOLHDL 3.4 08/25/2016 1020   VLDL 34 (H) 07/02/2015 0959   LDLCALC 59 07/02/2015 0959      Wt Readings from Last 3 Encounters:  09/23/16 89.8 kg (198 lb)  09/18/16 89.4 kg (197 lb)  09/11/16 90.3 kg (199 lb)      ASSESSMENT AND PLAN:  # Atrial  fibrillation: Ms. Hettinger  Remains in sinus rhythm after cardioversion.  She has experienced a couple episodes of brief palpitations but no sustained arrhythmias.  We will reduce metoprolol to 50mg  given her bradycardia. Continue diltiazem and Eliquis.  We discussed the fact that her left atrium is moderately enlarged and it is possible that her atrial fibrillation could recur.  This patients CHA2DS2-VASc Score and unadjusted Ischemic Stroke Rate (% per year) is equal to 3.2 % stroke rate/year from a score of 3  Above score calculated as 1 point each if present [CHF, HTN, DM, Vascular=MI/PAD/Aortic Plaque, Age if 65-74, or Female] Above score calculated as 2 points each if present [Age > 75, or Stroke/TIA/TE]  # Hypertension:  Blood pressure is above goal her but she reports it is controlled at home.  She will get a BP cuff to verify. Continue diltiazem and reduce metoprolol as above.   # Hyperlipidemia: Continue atorvastatin 40mg  daily.  LDL 97 08/2016.    Current medicines are reviewed at length with the patient today.  The patient does not have concerns regarding medicines.  The following changes have been made: none  Labs/ tests ordered today include:   No orders of the  defined types were placed in this encounter.    Disposition:   FU with Godric Lavell C. Oval Linsey, MD, [facility] in 1 year.   This note was written with the assistance of speech recognition software.  Please excuse any transcriptional errors.  Signed, Bingham Millette C. Oval Linsey, MD, [facility]  09/23/2016 11:40 AM    [place]

## 2016-09-25 ENCOUNTER — Encounter: Payer: Self-pay | Admitting: Sports Medicine

## 2016-09-25 ENCOUNTER — Ambulatory Visit (INDEPENDENT_AMBULATORY_CARE_PROVIDER_SITE_OTHER): Payer: Medicare Other | Admitting: Sports Medicine

## 2016-09-25 DIAGNOSIS — M5136 Other intervertebral disc degeneration, lumbar region: Secondary | ICD-10-CM | POA: Diagnosis not present

## 2016-09-25 DIAGNOSIS — M51369 Other intervertebral disc degeneration, lumbar region without mention of lumbar back pain or lower extremity pain: Secondary | ICD-10-CM

## 2016-09-25 NOTE — Assessment & Plan Note (Addendum)
Axial discogenic pain is 60-70% better with a session of physical therapy and intermittent home exercises, overall she's doing well, pain relief has not plateaued, I can see her back as needed, if with continuing to do home rehabilitation exercises she doesn't improve significantly we will proceed with MRI and epidural, of note she is on Eliquis so we would have to avoid NSAIDs and stop it before injections.

## 2016-09-25 NOTE — Progress Notes (Signed)
  Subjective:    CC: Follow-up  HPI: Martha Perkins returns, she was having axial, discogenic back pain, after a session of physical therapy and intermittent home exercises, prednisone, meloxicam she tells me she is about 60-70% better and not yet plateaued. She will continue her home exercises for now, no bowel or bladder distention, saddle numbness, constitutional symptoms.  Past medical history:  Negative.  See flowsheet/record as well for more information.  Surgical history: Negative.  See flowsheet/record as well for more information.  Family history: Negative.  See flowsheet/record as well for more information.  Social history: Negative.  See flowsheet/record as well for more information.  Allergies, and medications have been entered into the medical record, reviewed, and no changes needed.   Review of Systems: No fevers, chills, night sweats, weight loss, chest pain, or shortness of breath.   Objective:    General: Well Developed, well nourished, and in no acute distress.  Neuro: Alert and oriented x3, extra-ocular muscles intact, sensation grossly intact.  HEENT: Normocephalic, atraumatic, pupils equal round reactive to light, neck supple, no masses, no lymphadenopathy, thyroid nonpalpable.  Skin: Warm and dry, no rashes. Cardiac: Regular rate and rhythm, no murmurs rubs or gallops, no lower extremity edema.  Respiratory: Clear to auscultation bilaterally. Not using accessory muscles, speaking in full sentences.  Impression and Recommendations:    Lumbar degenerative disc disease Axial discogenic pain is 60-70% better with a session of physical therapy and intermittent home exercises, overall she's doing well, pain relief has not plateaued, I can see her back as needed, if with continuing to do home rehabilitation exercises she doesn't improve significantly we will proceed with MRI and epidural, of note she is on Eliquis so we would have to avoid NSAIDs and stop it before injections.  I  spent 25 minutes with this patient, greater than 50% was face-to-face time counseling regarding the above diagnoses

## 2016-09-26 ENCOUNTER — Encounter: Payer: Self-pay | Admitting: *Deleted

## 2016-09-29 ENCOUNTER — Encounter: Payer: Self-pay | Admitting: Sports Medicine

## 2016-09-29 ENCOUNTER — Ambulatory Visit (INDEPENDENT_AMBULATORY_CARE_PROVIDER_SITE_OTHER): Payer: Medicare Other

## 2016-09-29 ENCOUNTER — Ambulatory Visit (INDEPENDENT_AMBULATORY_CARE_PROVIDER_SITE_OTHER): Payer: Medicare Other | Admitting: Sports Medicine

## 2016-09-29 DIAGNOSIS — M25511 Pain in right shoulder: Secondary | ICD-10-CM

## 2016-09-29 DIAGNOSIS — M25512 Pain in left shoulder: Secondary | ICD-10-CM | POA: Diagnosis not present

## 2016-09-29 DIAGNOSIS — M19011 Primary osteoarthritis, right shoulder: Secondary | ICD-10-CM | POA: Diagnosis not present

## 2016-09-29 DIAGNOSIS — G8929 Other chronic pain: Secondary | ICD-10-CM

## 2016-09-29 DIAGNOSIS — M19012 Primary osteoarthritis, left shoulder: Secondary | ICD-10-CM | POA: Diagnosis not present

## 2016-09-29 NOTE — Assessment & Plan Note (Signed)
Pain is referable to the glenohumeral joints today, does have a bit of impingement type pain but we are going to proceed today with bilateral glenohumeral injections. Return in 4 weeks, we will consider adding subacromial injections if still has any impingement type symptoms.

## 2016-09-29 NOTE — Progress Notes (Signed)
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 Pain is localized over the deltoids, posterior joint line, with significant stiffness. Moderate, persistent without radiation, no mechanical symptoms, no trauma.  Past medical history:  Negative.  See flowsheet/record as well for more information.  Surgical history: Negative.  See flowsheet/record as well for more information.  Family history: Negative.  See flowsheet/record as well for more information.  Social history: Negative.  See flowsheet/record as well for more information.  Allergies, and medications have been entered into the medical record, reviewed, and no changes needed.   Review of Systems: No fevers, chills, night sweats, weight loss, chest pain, or shortness of breath.   Objective:    General: Well Developed, well nourished, and in no acute distress.  Neuro: Alert and oriented x3, extra-ocular muscles intact, sensation grossly intact.  HEENT: Normocephalic, atraumatic, pupils equal round reactive to light, neck supple, no masses, no lymphadenopathy, thyroid nonpalpable.  Skin: Warm and dry, no rashes. Cardiac: Regular rate and rhythm, no murmurs rubs or gallops, no lower extremity edema.  Respiratory: Clear to auscultation bilaterally. Not using accessory muscles, speaking in full sentences. Bilateral shoulders: Inspection reveals no abnormalities, atrophy or asymmetry. Palpation is normal with no tenderness over AC joint or bicipital groove. Some lack of range of motion with approximately 30 of abduction and 40 of external rotation bilaterally Rotator cuff strength normal throughout. Only minimally positive Neer and Hawkin's tests, empty can. Speeds and Yergason's tests  normal. No labral pathology noted with negative Obrien's, negative crank, negative clunk, and good stability. Normal scapular function observed. No painful arc and no drop arm sign. No apprehension sign  Procedure: Real-time Ultrasound Guided Injection of left glenohumeral joint Device: GE Logiq E  Verbal informed consent obtained.  Time-out conducted.  Noted no overlying erythema, induration, or other signs of local infection.  Skin prepped in a sterile fashion.  Local anesthesia: Topical Ethyl chloride.  With sterile technique and under real time ultrasound guidance:  Using a 22-gauge spinal needle advanced into the joint and injected 1 mL kenalog 40, 2 mL lidocaine, 2 mL bupivacaine. Completed without difficulty  Pain immediately resolved suggesting accurate placement of the medication.  Advised to call if fevers/chills, erythema, induration, drainage, or persistent bleeding.  Images permanently stored and available for review in the ultrasound unit.  Impression: Technically successful ultrasound guided injection.  Procedure: Real-time Ultrasound Guided Injection of right glenohumeral joint Device: GE Logiq E  Verbal informed consent obtained.  Time-out conducted.  Noted no overlying erythema, induration, or other signs of local infection.  Skin prepped in a sterile fashion.  Local anesthesia: Topical Ethyl chloride.  With sterile technique and under real time ultrasound guidance:  Using a 22-gauge spinal needle advanced into the joint and injected 1 mL kenalog 40, 2 mL lidocaine, 2 mL bupivacaine. Completed without difficulty  Pain immediately resolved suggesting accurate placement of the medication.  Advised to call if fevers/chills, erythema, induration, drainage, or persistent bleeding.  Images permanently stored and available for review in the ultrasound unit.  Impression: Technically successful ultrasound guided injection.  Impression and Recommendations:    Bilateral  shoulder pain Pain is referable to the glenohumeral joints today, does have a bit of impingement type pain but we are going to proceed today with bilateral  glenohumeral injections. Return in 4 weeks, we will consider adding subacromial injections if still has any impingement type symptoms.  I spent 25 minutes with this patient, greater than 50% was face-to-face time counseling regarding the above diagnoses, this was separate from the time spent performing the above procedures.

## 2016-10-17 ENCOUNTER — Telehealth: Payer: Self-pay

## 2016-10-17 NOTE — Telephone Encounter (Signed)
Okay, we have 2 options. We can try calling the drug rep to see if we might be able to get some samples. Though it will not be enough to cover her through the rest of the year but we might be able to get 1 or 2 months supply. The other option would be to switch her to Coumadin which requires monitoring so she would have to come in and get her levels checked potentially every couple of weeks until we can get her level situated. Please see which she would prefer to do. If she wants samples and please call the drug rep.

## 2016-10-17 NOTE — Telephone Encounter (Signed)
Patient called stated that she does not follow up with her Cardiologist for another year, she wants to know that since you put her on the Eliquis 5 mg should she stay on it because she is in a donut hole at this time and she really cant afford to pay for it. Rhonda Cunningham,CMA

## 2016-10-20 ENCOUNTER — Other Ambulatory Visit: Payer: Self-pay | Admitting: *Deleted

## 2016-10-20 MED ORDER — TRAMADOL HCL 50 MG PO TABS: 50.0000 mg | ORAL_TABLET | Freq: Three times a day (TID) | ORAL | 0 refills | Status: DC | PRN

## 2016-10-21 NOTE — Telephone Encounter (Signed)
Left VM with drug rep Baxter Flattery [phone]) to see if we can get some samples.

## 2016-10-21 NOTE — Telephone Encounter (Signed)
Samples received, left VM for Pt that they are ready to pick up.

## 2016-10-27 ENCOUNTER — Ambulatory Visit (INDEPENDENT_AMBULATORY_CARE_PROVIDER_SITE_OTHER): Payer: Medicare Other | Admitting: Sports Medicine

## 2016-10-27 ENCOUNTER — Encounter: Payer: Self-pay | Admitting: Sports Medicine

## 2016-10-27 DIAGNOSIS — G8929 Other chronic pain: Secondary | ICD-10-CM

## 2016-10-27 DIAGNOSIS — M25512 Pain in left shoulder: Secondary | ICD-10-CM | POA: Diagnosis not present

## 2016-10-27 DIAGNOSIS — M25511 Pain in right shoulder: Secondary | ICD-10-CM

## 2016-10-27 NOTE — Assessment & Plan Note (Signed)
Bilateral glenohumeral osteoarthritis, resolved after bilateral injection. She did have some impingement type symptoms at the last visit that are also resolved now. Return as needed.

## 2016-10-27 NOTE — Progress Notes (Signed)
  Subjective:    CC: Follow-up  HPI: Bilateral glenohumeral osteoarthritis: Nearly pain-free after bilateral glenohumeral injections at the last visit, did have a short-lived steroid flare the day after the injection but otherwise was okay.  Past medical history:  Negative.  See flowsheet/record as well for more information.  Surgical history: Negative.  See flowsheet/record as well for more information.  Family history: Negative.  See flowsheet/record as well for more information.  Social history: Negative.  See flowsheet/record as well for more information.  Allergies, and medications have been entered into the medical record, reviewed, and no changes needed.   Review of Systems: No fevers, chills, night sweats, weight loss, chest pain, or shortness of breath.   Objective:    General: Well Developed, well nourished, and in no acute distress.  Neuro: Alert and oriented x3, extra-ocular muscles intact, sensation grossly intact.  HEENT: Normocephalic, atraumatic, pupils equal round reactive to light, neck supple, no masses, no lymphadenopathy, thyroid nonpalpable.  Skin: Warm and dry, no rashes. Cardiac: Regular rate and rhythm, no murmurs rubs or gallops, no lower extremity edema.  Respiratory: Clear to auscultation bilaterally. Not using accessory muscles, speaking in full sentences.  Impression and Recommendations:    Bilateral shoulder pain Bilateral glenohumeral osteoarthritis, resolved after bilateral injection. She did have some impingement type symptoms at the last visit that are also resolved now. Return as needed.

## 2016-11-05 ENCOUNTER — Other Ambulatory Visit: Payer: Self-pay

## 2016-11-05 ENCOUNTER — Telehealth: Payer: Self-pay | Admitting: Endocrinology

## 2016-11-05 ENCOUNTER — Telehealth: Payer: Self-pay | Admitting: Cardiovascular Disease

## 2016-11-05 MED ORDER — LEVOTHYROXINE SODIUM 112 MCG PO TABS: 112.0000 ug | ORAL_TABLET | Freq: Every day | ORAL | 3 refills | Status: DC

## 2016-11-05 NOTE — Telephone Encounter (Signed)
Received call from patient.Eliquis free sample card left at [facility] office front desk.

## 2016-11-05 NOTE — Telephone Encounter (Signed)
Returned call to patient no answer.LMTC. 

## 2016-11-05 NOTE — Telephone Encounter (Signed)
MEDICATION: levothyroxine 112 mcg  PHARMACY: [facility]   IS THIS A 90 DAY SUPPLY : yes  IS PATIENT OUT OF MEDICATION: no  IF NOT; HOW MUCH IS LEFT: 5 pills left  LAST APPOINTMENT DATE: 07/05  NEXT APPOINTMENT DATE: 09/13  OTHER COMMENTS:    **Let patient know to contact pharmacy at the end of the day to make sure medication is ready. **  ** Please notify patient to allow 48-72 hours to process**  **Encourage patient to contact the pharmacy for refills or they can request refills through [facility]**

## 2016-11-05 NOTE — Telephone Encounter (Signed)
Routing to you °

## 2016-11-05 NOTE — Telephone Encounter (Signed)
New message    Pt is in donut hole  Pt c/o medication issue:  1. Name of Medication:  Eliquis   2. How are you currently taking this medication (dosage and times per day)?  2x a day  3. Are you having a reaction (difficulty breathing--STAT)?  no 4. What is your medication issue? She can not afford to take this medication , someone told her they would send her a discount card the would help with the cost , she never got it

## 2016-11-07 ENCOUNTER — Telehealth: Payer: Self-pay

## 2016-11-07 MED ORDER — LEVOTHYROXINE SODIUM 112 MCG PO TABS: 112.0000 ug | ORAL_TABLET | Freq: Every day | ORAL | 3 refills | Status: DC

## 2016-11-07 NOTE — Telephone Encounter (Signed)
REF# [id number] needs to know why levothryroxine dosage increased?  Ty,  -LL

## 2016-11-11 NOTE — Telephone Encounter (Signed)
Patient called [facility] & clarified issues. They are sending her medication.

## 2016-11-20 ENCOUNTER — Ambulatory Visit: Payer: Self-pay | Admitting: Endocrinology

## 2016-12-05 ENCOUNTER — Telehealth: Payer: Self-pay | Admitting: *Deleted

## 2016-12-05 MED ORDER — AMOXICILLIN 500 MG PO CAPS: 2000.0000 mg | ORAL_CAPSULE | Freq: Once | ORAL | 1 refills | Status: AC

## 2016-12-05 NOTE — Telephone Encounter (Signed)
OK, new rx sent to [facility]

## 2016-12-05 NOTE — Telephone Encounter (Signed)
Left VM advising Pt of Rx.

## 2016-12-05 NOTE — Telephone Encounter (Signed)
Patient called and is requesting an antibiotic to take before a dental procedure. She is going to a new dentist and they will not provide it for her because she is new to them

## 2016-12-08 ENCOUNTER — Encounter: Payer: Self-pay | Admitting: Family Medicine

## 2016-12-08 ENCOUNTER — Ambulatory Visit (INDEPENDENT_AMBULATORY_CARE_PROVIDER_SITE_OTHER): Payer: Medicare Other | Admitting: Family Medicine

## 2016-12-08 VITALS — BP 122/64 | HR 61 | Ht 62.0 in | Wt 197.0 lb

## 2016-12-08 DIAGNOSIS — R42 Dizziness and giddiness: Secondary | ICD-10-CM

## 2016-12-08 DIAGNOSIS — R0989 Other specified symptoms and signs involving the circulatory and respiratory systems: Secondary | ICD-10-CM | POA: Diagnosis not present

## 2016-12-08 LAB — CBC WITH DIFFERENTIAL/PLATELET
BASOS ABS: 52 {cells}/uL (ref 0–200)
Basophils Relative: 0.9 %
Eosinophils Absolute: 151 cells/uL (ref 15–500)
Eosinophils Relative: 2.6 %
HCT: 38.7 % (ref 35.0–45.0)
HEMOGLOBIN: 13.5 g/dL (ref 11.7–15.5)
LYMPHS ABS: 2007 {cells}/uL (ref 850–3900)
MCH: 31.9 pg (ref 27.0–33.0)
MCHC: 34.9 g/dL (ref 32.0–36.0)
MCV: 91.5 fL (ref 80.0–100.0)
MPV: 11.1 fL (ref 7.5–12.5)
Monocytes Relative: 12.7 %
NEUTROS PCT: 49.2 %
Neutro Abs: 2854 cells/uL (ref 1500–7800)
PLATELETS: 203 10*3/uL (ref 140–400)
RBC: 4.23 10*6/uL (ref 3.80–5.10)
RDW: 12.6 % (ref 11.0–15.0)
Total Lymphocyte: 34.6 %
WBC: 5.8 10*3/uL (ref 3.8–10.8)
WBCMIX: 737 {cells}/uL (ref 200–950)

## 2016-12-08 LAB — BASIC METABOLIC PANEL WITH GFR
BUN: 15 mg/dL (ref 7–25)
CALCIUM: 9.4 mg/dL (ref 8.6–10.4)
CO2: 28 mmol/L (ref 20–32)
Chloride: 104 mmol/L (ref 98–110)
Creat: 0.65 mg/dL (ref 0.50–0.99)
GFR, Est African American: 105 mL/min/{1.73_m2} (ref >=60)
GFR, Est Non African American: 91 mL/min/{1.73_m2} (ref >=60)
GLUCOSE: 89 mg/dL (ref 65–99)
POTASSIUM: 3.9 mmol/L (ref 3.5–5.3)
SODIUM: 139 mmol/L (ref 135–146)

## 2016-12-08 LAB — TSH: TSH: 0.71 mIU/L (ref 0.40–4.50)

## 2016-12-08 MED ORDER — METOPROLOL SUCCINATE ER 50 MG PO TB24: 50.0000 mg | ORAL_TABLET | ORAL | 1 refills | Status: DC

## 2016-12-08 NOTE — Progress Notes (Signed)
Subjective:    Patient ID: Martha Perkins, female    DOB: 1945-06-19, 71 y.o.   MRN: [id number]  HPI 71 year old female with a recent history of atrial fibrillation status post cardioversion comes in today with having these episodes of feeling like she is having a sinking feeling in her chest. She's had about 4-5 episodes of the last 2 weeks when it first started. It lasts for approximately 1 minute. She says sometimes when it happened she some is felt like a tingling or numbness over her chest. She feels like she is so weak she is going to actually fall in the floor. She denies any room spinning. She missed feels like everything in her chest is dropping down into her abdomen. Every time it happened she was sitting she was not very active. She has not had any shortness of breath or chest pain with it. She denies any recent palpitations or feeling like she is having a high pulse. She says it almost feels similar to what she felt like when she was first diagnosed with hyperthyroidism except then it felt like it was happening in her head versus in her torso. She is not really on any new medications. She was recently encouraged to decrease her metoprolol to half a tab but says that it was too difficult to split them so just continue to take a whole. She was encouraged to do so because she was having pulses in the 50s at that time.   Review of Systems  BP (!) 145/65   Pulse 61   Ht 5\' 2"  (1.575 m)   Wt 197 lb (89.4 kg)   SpO2 97%   BMI 36.03 kg/m     Allergies  Allergen Reactions  . Demerol Nausea Only  . Meperidine Hcl Other (See Comments)    Feels loopy  . Oxycodone-Aspirin Other (See Comments)    Feels loopy  . Percocet [Oxycodone-Acetaminophen] Other (See Comments)    Feels loopy    Past Medical History:  Diagnosis Date  . Depression   . Hyperlipidemia   . Hypertension   . Hyperthyroidism 08/09/2015  . Kidney stone   . Paroxysmal atrial fibrillation ([place]) 08/09/2015  . Thyroid disease      Past Surgical History:  Procedure Laterality Date  . CARDIOVERSION N/A 08/05/2016   Procedure: CARDIOVERSION;  Surgeon: Skeet Latch, MD;  Location: [facility];  Service: Cardiovascular;  Laterality: N/A;  . CATARACT EXTRACTION Left 09/2012   Dr. Bunnie Philips  . LITHOTRIPSY    . REPLACEMENT TOTAL KNEE BILATERAL  2011  . THYROIDECTOMY  1971  . TUBAL LIGATION  1979    Social History   Social History  . Marital status: Married    Spouse name: N/A  . Number of children: N/A  . Years of education: N/A   Occupational History  . Retired Therapist, sports.     Social History Main Topics  . Smoking status: Never Smoker  . Smokeless tobacco: Never Used  . Alcohol use No  . Drug use: No  . Sexual activity: Not on file   Other Topics Concern  . Not on file   Social History Narrative  . No narrative on file    Family History  Problem Relation Age of Onset  . Coronary artery disease Mother 73       deceased  . Depression Mother   . Hyperlipidemia Mother   . Diabetes Paternal Grandmother   . Thyroid disease Sister     Outpatient Encounter  Prescriptions as of 12/08/2016  Medication Sig  . acetaminophen (TYLENOL) 650 MG CR tablet Take 1 tablet (650 mg total) by mouth every 8 (eight) hours.  Marland Kitchen albuterol (PROAIR HFA) 108 (90 Base) MCG/ACT inhaler Inhale 2 puffs into the lungs every 6 (six) hours as needed for wheezing or shortness of breath.  Marland Kitchen atorvastatin (LIPITOR) 40 MG tablet TAKE 1 TABLET BY MOUTH  DAILY  . diltiazem (CARDIZEM CD) 240 MG 24 hr capsule Take 1 capsule (240 mg total) by mouth daily.  Marland Kitchen ELIQUIS 5 MG TABS tablet TAKE ONE TABLET BY MOUTH TWICE DAILY  . esomeprazole (NEXIUM) 40 MG capsule Take 1 capsule (40 mg total) by mouth daily.  . fluticasone (FLONASE) 50 MCG/ACT nasal spray USE 1 SPRAY IN EACH NOSTRIL TWO TIMES DAILY  . furosemide (LASIX) 20 MG tablet Take 1 tablet (20 mg total) by mouth daily as needed (SWELLING).  Marland Kitchen ketoconazole (NIZORAL) 2 % cream APPLY  TOPICALLY DAILY AS  NEEDED FOR IRRITATION.  Marland Kitchen levothyroxine (SYNTHROID, LEVOTHROID) 112 MCG tablet Take 1 tablet (112 mcg total) by mouth daily before breakfast.  . metoprolol succinate (TOPROL-XL) 50 MG 24 hr tablet Take 1 tablet (50 mg total) by mouth as directed. Take with or immediately following a meal.  . traMADol (ULTRAM) 50 MG tablet Take 1 tablet (50 mg total) by mouth 3 (three) times daily as needed.  . [DISCONTINUED] metoprolol succinate (TOPROL-XL) 100 MG 24 hr tablet Take 100 mg by mouth as directed. Take 1/2 tablet by mouth daily. Take with or immediately following a meal.  . [DISCONTINUED] metoprolol succinate (TOPROL-XL) 50 MG 24 hr tablet Take 1 tablet (50 mg total) by mouth as directed. Take 1/2 tablet by mouth daily. Take with or immediately following a meal.   No facility-administered encounter medications on file as of 12/08/2016.           Objective:   Physical Exam  Constitutional: She is oriented to person, place, and time. She appears well-developed and well-nourished.  HENT:  Head: Normocephalic and atraumatic.  Neck: Neck supple. No thyromegaly present.  Cardiovascular: Normal rate, regular rhythm and normal heart sounds.   No carotid bruit   Pulmonary/Chest: Effort normal and breath sounds normal.  Musculoskeletal: She exhibits no edema.  Lymphadenopathy:    She has no cervical adenopathy.  Neurological: She is alert and oriented to person, place, and time.  Skin: Skin is warm and dry.  Psychiatric: She has a normal mood and affect. Her behavior is normal.       Assessment & Plan:  Sinking feeling in her chest-unclear what this may be. These episodes are very short-lived usually minute or less. Could be that she is having more severe bradycardia that she's actually feeling weak, or it could be that she's actually flipping into atrial fibrillation. Also her TSH was still abnormal back in July. TSH was around 20 and he adjusted her medication. She is  actually due for follow-up in a couple of weeks with her endocrinologist. Burnis Medin go ahead and check that level as well. Check for CBC for anemia. And check for electrolyte disturbance.

## 2016-12-11 ENCOUNTER — Ambulatory Visit: Payer: Self-pay

## 2016-12-15 ENCOUNTER — Other Ambulatory Visit: Payer: Self-pay | Admitting: Family Medicine

## 2016-12-18 ENCOUNTER — Encounter: Payer: Self-pay | Admitting: Endocrinology

## 2016-12-18 ENCOUNTER — Ambulatory Visit (INDEPENDENT_AMBULATORY_CARE_PROVIDER_SITE_OTHER): Payer: Medicare Other | Admitting: Endocrinology

## 2016-12-18 VITALS — BP 118/78 | HR 60 | Temp 99.0°F | Resp 16 | Ht 62.0 in | Wt 198.1 lb

## 2016-12-18 DIAGNOSIS — E890 Postprocedural hypothyroidism: Secondary | ICD-10-CM

## 2016-12-18 NOTE — Patient Instructions (Signed)
Please continue the same thyroid medication. I would be happy to see you back here as needed.   

## 2016-12-18 NOTE — Progress Notes (Signed)
Subjective:    Patient ID: Martha Perkins, female    DOB: 1946-02-27, 71 y.o.   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  pt states she feels well in general, except for ongoing "hot flashes."  Past Medical History:  Diagnosis Date  . Depression   . Hyperlipidemia   . Hypertension   . Hyperthyroidism 08/09/2015  . Kidney stone   . Paroxysmal atrial fibrillation ([place]) 08/09/2015  . Thyroid disease     Past Surgical History:  Procedure Laterality Date  . CARDIOVERSION N/A 08/05/2016   Procedure: CARDIOVERSION;  Surgeon: Skeet Latch, MD;  Location: [facility];  Service: Cardiovascular;  Laterality: N/A;  . CATARACT EXTRACTION Left 09/2012   Dr. Bunnie Philips  . LITHOTRIPSY    . REPLACEMENT TOTAL KNEE BILATERAL  2011  . THYROIDECTOMY  1971  . TUBAL LIGATION  1979    Social History   Social History  . Marital status: Married    Spouse name: N/A  . Number of children: N/A  . Years of education: N/A   Occupational History  . Retired Therapist, sports.     Social History Main Topics  . Smoking status: Never Smoker  . Smokeless tobacco: Never Used  . Alcohol use No  . Drug use: No  . Sexual activity: Not on file   Other Topics Concern  . Not on file   Social History Narrative  . No narrative on file    Current Outpatient Prescriptions on File Prior to Visit  Medication Sig Dispense Refill  . acetaminophen (TYLENOL) 650 MG CR tablet Take 1 tablet (650 mg total) by mouth every 8 (eight) hours. 90 tablet 3  . albuterol (PROAIR HFA) 108 (90 Base) MCG/ACT inhaler Inhale 2 puffs into the lungs every 6 (six) hours as needed for wheezing or shortness of breath. 18 each 3  . atorvastatin (LIPITOR) 40 MG tablet TAKE 1  TABLET BY MOUTH  DAILY 90 tablet 3  . diltiazem (CARDIZEM CD) 240 MG 24 hr capsule Take 1 capsule (240 mg total) by mouth daily. 90 capsule 3  . ELIQUIS 5 MG TABS tablet TAKE 1 TABLET BY MOUTH TWICE DAILY 60 tablet 0  . esomeprazole (NEXIUM) 40 MG capsule Take 1 capsule (40 mg total) by mouth daily. 90 capsule 0  . fluticasone (FLONASE) 50 MCG/ACT nasal spray USE 1 SPRAY IN EACH NOSTRIL TWO TIMES DAILY 48 g 1  . furosemide (LASIX) 20 MG tablet Take 1 tablet (20 mg total) by mouth daily as needed (SWELLING). 90 tablet 1  . ketoconazole (NIZORAL) 2 % cream APPLY TOPICALLY DAILY AS  NEEDED FOR IRRITATION. 60 g 1  . levothyroxine (SYNTHROID, LEVOTHROID) 112 MCG tablet Take 1 tablet (112 mcg total) by mouth daily before breakfast. 90 tablet 3  . traMADol (ULTRAM) 50 MG tablet Take 1 tablet (50 mg total) by mouth 3 (three) times daily as needed. 270 tablet 0   No current facility-administered medications on file prior to visit.     Allergies  Allergen Reactions  . Demerol Nausea Only  . Meperidine Hcl Other (See Comments)    Feels loopy  . Oxycodone-Aspirin Other (See Comments)    Feels loopy  . Percocet [  Oxycodone-Acetaminophen] Other (See Comments)    Feels loopy    Family History  Problem Relation Age of Onset  . Coronary artery disease Mother 14       deceased  . Depression Mother   . Hyperlipidemia Mother   . Diabetes Paternal Grandmother   . Thyroid disease Sister     BP 118/78 (BP Location: Left Arm, Patient Position: Sitting, Cuff Size: Normal)   Pulse 60   Temp 99 F (37.2 C) (Oral)   Resp 16   Ht 5\' 2"  (1.575 m)   Wt 198 lb 2 oz (89.9 kg)   SpO2 96%   BMI 36.24 kg/m    Review of Systems She has intermitt night sweats.      Objective:   Physical Exam VITAL SIGNS:  See vs page GENERAL: no distress NECK: a healed scar is present.  left thyroid lobe is slightly and diffusely enlarged.    Neuro: no tremor.  Skin: not diaphoretic.  PSYCH: Does not appear anxious  nor depressed.      Lab Results  Component Value Date   TSH 0.71 12/08/2016   T3TOTAL 319.0 (H) 07/02/2015      Assessment & Plan:  Thyroid mass: much better Post-RAI hypothyroidism: well-replaced.   Patient Instructions  Please continue the same thyroid medication. I would be happy to see you back here as needed.

## 2016-12-19 ENCOUNTER — Other Ambulatory Visit: Payer: Self-pay | Admitting: *Deleted

## 2016-12-19 MED ORDER — METOPROLOL SUCCINATE ER 50 MG PO TB24: 50.0000 mg | ORAL_TABLET | ORAL | 1 refills | Status: DC

## 2016-12-23 ENCOUNTER — Other Ambulatory Visit: Payer: Self-pay | Admitting: *Deleted

## 2016-12-23 ENCOUNTER — Other Ambulatory Visit: Payer: Self-pay | Admitting: Family Medicine

## 2016-12-23 MED ORDER — METOPROLOL SUCCINATE ER 50 MG PO TB24: 50.0000 mg | ORAL_TABLET | Freq: Every day | ORAL | 1 refills | Status: DC

## 2016-12-26 ENCOUNTER — Other Ambulatory Visit: Payer: Self-pay | Admitting: *Deleted

## 2016-12-26 MED ORDER — METOPROLOL SUCCINATE ER 50 MG PO TB24: 50.0000 mg | ORAL_TABLET | Freq: Every day | ORAL | 1 refills | Status: DC

## 2017-01-01 ENCOUNTER — Ambulatory Visit (INDEPENDENT_AMBULATORY_CARE_PROVIDER_SITE_OTHER): Payer: Medicare Other | Admitting: Sports Medicine

## 2017-01-01 DIAGNOSIS — M19011 Primary osteoarthritis, right shoulder: Secondary | ICD-10-CM | POA: Diagnosis not present

## 2017-01-01 DIAGNOSIS — M19012 Primary osteoarthritis, left shoulder: Secondary | ICD-10-CM | POA: Diagnosis not present

## 2017-01-01 MED ORDER — HYDROCODONE-ACETAMINOPHEN 5-325 MG PO TABS: 1.0000 | ORAL_TABLET | Freq: Three times a day (TID) | ORAL | 0 refills | Status: DC | PRN

## 2017-01-01 NOTE — Assessment & Plan Note (Signed)
Injection of the left side, formal physical therapy. I do think she is approaching shoulder arthroplasty. She did have a fall, only has some buttock contusions, adding a bit of hydrocodone to take at night.

## 2017-01-01 NOTE — Progress Notes (Signed)
  Subjective:    CC: Shoulder pain, fall  HPI: This is a pleasant 71 year old female, she has known bilateral glenohumeral osteoarthritis, had a recent fall, and also has some pain and contusion on her buttock but able to bear weight appropriately.  Last injection was 3-4 months ago.  Pain is only on the right shoulder, desires injection.  Moderate, persistent.  Past medical history:  Negative.  See flowsheet/record as well for more information.  Surgical history: Negative.  See flowsheet/record as well for more information.  Family history: Negative.  See flowsheet/record as well for more information.  Social history: Negative.  See flowsheet/record as well for more information.  Allergies, and medications have been entered into the medical record, reviewed, and no changes needed.   Review of Systems: No fevers, chills, night sweats, weight loss, chest pain, or shortness of breath.   Objective:    General: Well Developed, well nourished, and in no acute distress.  Neuro: Alert and oriented x3, extra-ocular muscles intact, sensation grossly intact.  HEENT: Normocephalic, atraumatic, pupils equal round reactive to light, neck supple, no masses, no lymphadenopathy, thyroid nonpalpable.  Skin: Warm and dry, no rashes. Cardiac: Regular rate and rhythm, no murmurs rubs or gallops, no lower extremity edema.  Respiratory: Clear to auscultation bilaterally. Not using accessory muscles, speaking in full sentences. Left shoulder: Tender to palpation at the joint line, very little motion, external rotation to about 10 degrees, abduction to 20 degrees.  Procedure: Real-time Ultrasound Guided Injection of left glenohumeral joint Device: GE Logiq E  Verbal informed consent obtained.  Time-out conducted.  Noted no overlying erythema, induration, or other signs of local infection.  Skin prepped in a sterile fashion.  Local anesthesia: Topical Ethyl chloride.  With sterile technique and under real  time ultrasound guidance: Using a posterior approach I advanced into the glenohumeral joint and injected 1 cc kenalog 40, 2 cc lidocaine, 2 cc bupivacaine. Completed without difficulty  Pain immediately resolved suggesting accurate placement of the medication.  Advised to call if fevers/chills, erythema, induration, drainage, or persistent bleeding.  Images permanently stored and available for review in the ultrasound unit.  Impression: Technically successful ultrasound guided injection.  Impression and Recommendations:    Primary osteoarthritis of both shoulders Injection of the left side, formal physical therapy. I do think she is approaching shoulder arthroplasty. She did have a fall, only has some buttock contusions, adding a bit of hydrocodone to take at night.  ___________________________________________ Gwen Her. Dianah Field, M.D., ABFM., CAQSM. Primary Care and [place] Instructor of [place] [facility]

## 2017-01-09 ENCOUNTER — Other Ambulatory Visit: Payer: Self-pay | Admitting: *Deleted

## 2017-01-09 MED ORDER — TRAMADOL HCL 50 MG PO TABS: 50.0000 mg | ORAL_TABLET | Freq: Three times a day (TID) | ORAL | 0 refills | Status: DC | PRN

## 2017-01-15 ENCOUNTER — Ambulatory Visit (INDEPENDENT_AMBULATORY_CARE_PROVIDER_SITE_OTHER): Payer: Medicare Other | Admitting: Family Medicine

## 2017-01-15 ENCOUNTER — Ambulatory Visit (INDEPENDENT_AMBULATORY_CARE_PROVIDER_SITE_OTHER): Payer: Medicare Other | Admitting: Physical Therapy

## 2017-01-15 ENCOUNTER — Ambulatory Visit: Payer: Self-pay | Admitting: Family Medicine

## 2017-01-15 ENCOUNTER — Encounter: Payer: Self-pay | Admitting: Family Medicine

## 2017-01-15 ENCOUNTER — Encounter: Payer: Self-pay | Admitting: Physical Therapy

## 2017-01-15 VITALS — BP 143/64 | HR 62 | Ht 62.0 in | Wt 200.0 lb

## 2017-01-15 DIAGNOSIS — M25511 Pain in right shoulder: Secondary | ICD-10-CM | POA: Diagnosis not present

## 2017-01-15 DIAGNOSIS — Z0000 Encounter for general adult medical examination without abnormal findings: Secondary | ICD-10-CM | POA: Diagnosis not present

## 2017-01-15 DIAGNOSIS — R293 Abnormal posture: Secondary | ICD-10-CM | POA: Diagnosis not present

## 2017-01-15 DIAGNOSIS — M25512 Pain in left shoulder: Secondary | ICD-10-CM

## 2017-01-15 DIAGNOSIS — G8929 Other chronic pain: Secondary | ICD-10-CM | POA: Diagnosis not present

## 2017-01-15 NOTE — Progress Notes (Signed)
Subjective:   Martha Perkins is a 71 y.o. female who presents for Medicare Annual (Subsequent) preventive examination.  She is having some problems getting her medication.  She just had her Medicare And so she is having to pay a significant cost of her Eliquis and her albuterol.  She wanted to know if she will be able to come off her Eliquis at some point.  She has been out of A. fib at least since July.  She really is wanting to come off of the Eliquis for a couple of reasons.  One is she is bruising frequently, the cost, and she is unable to take NSAIDs for her muscular skeletal pain that she is using tramadol.  When she uses tramadol she just feels like it makes her more groggy and has fallen got getting out of bed a couple times on the medication.    Review of Systems:  Alfonse Spruce of review of systems is negative.       Objective:     Vitals: BP (!) 148/58   Pulse 68   Ht 5\' 2"  (1.575 m)   Wt 200 lb (90.7 kg)   BMI 36.58 kg/m   Body mass index is 36.58 kg/m.  Physical Exam  Constitutional: She is oriented to person, place, and time. She appears well-developed and well-nourished.  HENT:  Head: Normocephalic and atraumatic.  Right Ear: External ear normal.  Left Ear: External ear normal.  Nose: Nose normal.  Mouth/Throat: Oropharynx is clear and moist.  Left TM and canal blocked by cerumen.  Right TM and canals clear.  Eyes: Conjunctivae and EOM are normal. Pupils are equal, round, and reactive to light.  Neck: Neck supple. No thyromegaly present.  Cardiovascular: Normal rate, regular rhythm and normal heart sounds.  Pulmonary/Chest: Effort normal and breath sounds normal. She has no wheezes.  Abdominal: Soft. Bowel sounds are normal. She exhibits no distension and no mass. There is no tenderness. There is no rebound and no guarding. No hernia.  Lymphadenopathy:    She has no cervical adenopathy.  Neurological: She is alert and oriented to person, place, and time.  Skin: Skin  is warm and dry.  Psychiatric: She has a normal mood and affect.     Tobacco Social History   Tobacco Use  Smoking Status Never Smoker  Smokeless Tobacco Never Used     Counseling given: Not Answered   Past Medical History:  Diagnosis Date  . Depression   . Hyperlipidemia   . Hypertension   . Hyperthyroidism 08/09/2015  . Kidney stone   . Paroxysmal atrial fibrillation ([place]) 08/09/2015  . Thyroid disease    Past Surgical History:  Procedure Laterality Date  . CATARACT EXTRACTION Left 09/2012   Dr. Bunnie Philips  . LITHOTRIPSY    . REPLACEMENT TOTAL KNEE BILATERAL  2011  . THYROIDECTOMY  1971  . TUBAL LIGATION  1979   Family History  Problem Relation Age of Onset  . Coronary artery disease Mother 67       deceased  . Depression Mother   . Hyperlipidemia Mother   . Diabetes Paternal Grandmother   . Thyroid disease Sister    Social History   Substance and Sexual Activity  Sexual Activity Not on file    Outpatient Encounter Medications as of 01/15/2017  Medication Sig  . acetaminophen (TYLENOL) 650 MG CR tablet Take 1 tablet (650 mg total) by mouth every 8 (eight) hours.  Marland Kitchen albuterol (PROAIR HFA) 108 (90 Base) MCG/ACT inhaler  Inhale 2 puffs into the lungs every 6 (six) hours as needed for wheezing or shortness of breath.  Marland Kitchen atorvastatin (LIPITOR) 40 MG tablet TAKE 1 TABLET BY MOUTH  DAILY  . diltiazem (CARDIZEM CD) 240 MG 24 hr capsule Take 1 capsule (240 mg total) by mouth daily.  Marland Kitchen ELIQUIS 5 MG TABS tablet TAKE 1 TABLET BY MOUTH TWICE DAILY  . esomeprazole (NEXIUM) 40 MG capsule Take 1 capsule (40 mg total) by mouth daily.  . fluticasone (FLONASE) 50 MCG/ACT nasal spray USE 1 SPRAY IN EACH NOSTRIL TWO TIMES DAILY  . furosemide (LASIX) 20 MG tablet Take 1 tablet (20 mg total) by mouth daily as needed (SWELLING).  Marland Kitchen HYDROcodone-acetaminophen (NORCO/VICODIN) 5-325 MG tablet Take 1 tablet by mouth every 8 (eight) hours as needed for moderate pain.  Marland Kitchen ketoconazole  (NIZORAL) 2 % cream APPLY TOPICALLY DAILY AS  NEEDED FOR IRRITATION.  Marland Kitchen levothyroxine (SYNTHROID, LEVOTHROID) 112 MCG tablet Take 1 tablet (112 mcg total) by mouth daily before breakfast.  . metoprolol succinate (TOPROL-XL) 50 MG 24 hr tablet Take 1 tablet (50 mg total) by mouth daily.  . traMADol (ULTRAM) 50 MG tablet Take 1 tablet (50 mg total) by mouth 3 (three) times daily as needed.   No facility-administered encounter medications on file as of 01/15/2017.     Activities of Daily Living In your present state of health, do you have any difficulty performing the following activities: 01/15/2017  Hearing? Y  Comment ear ringing  Vision? N  Difficulty concentrating or making decisions? N  Walking or climbing stairs? N  Dressing or bathing? N  Doing errands, shopping? N  Some recent data might be hidden    Patient Care Team: Hali Marry, MD as PCP - General (Family Medicine) Renato Shin, MD as Consulting Physician (Endocrinology)    Assessment:    Medicare Wellness Exam   Exercise Activities and Dietary recommendations Current Exercise Habits: The patient does not participate in regular exercise at present, Exercise limited by: orthopedic condition(s)  Goals    None     Fall Risk Fall Risk  01/15/2017 12/08/2016 07/13/2015 07/03/2014 05/31/2013  Falls in the past year? No No No No No   Depression Screen PHQ 2/9 Scores 01/15/2017 12/08/2016 07/15/2016 07/13/2015  PHQ - 2 Score 0 0 0 0     Cognitive Function     6CIT Screen 01/15/2017 01/11/2016  What Year? 0 points 0 points  What month? 0 points 0 points  What time? 0 points 0 points  Count back from 20 0 points 0 points  Months in reverse 0 points 0 points  Repeat phrase 2 points 0 points  Total Score 2 0    Immunization History  Administered Date(s) Administered  . Influenza Split 01/27/2012, 01/01/2017  . Influenza,inj,Quad PF,6+ Mos 11/26/2012, 12/29/2013, 12/14/2014, 11/13/2015  . Pneumococcal Conjugate-13  12/29/2013  . Pneumococcal Polysaccharide-23 11/26/2012  . Tdap 07/02/2015   Screening Tests Health Maintenance  Topic Date Due  . INFLUENZA VACCINE  10/08/2016  . MAMMOGRAM  02/25/2018  . COLONOSCOPY  02/19/2025  . TETANUS/TDAP  07/01/2025  . DEXA SCAN  Completed  . Hepatitis C Screening  Completed  . PNA vac Low Risk Adult  Completed      Plan:      I have personally reviewed and noted the following in the patient's chart:   . Medical and social history . Use of alcohol, tobacco or illicit drugs  . Current medications and supplements . Functional ability  and status  . Nutritional status  -she is obese with a BMI of 36. Marland Kitchen Physical activity . Advanced directives . List of other physicians -care team updated. Marland Kitchen Hospitalizations, surgeries, and ER visits in previous 12 months . Vitals . Screenings to include cognitive, depression, and falls - updated.   Passed her 6 CIT.  Marland Kitchen Referrals and appointments . [facility] -she wants to check with insurance to see if she be able to get in the doctor's office versus using her Medicare part D.  Information provided. . Ringing in ears.  . Left cerumen impaction - recommend debrox OTC.   Marland Kitchen History of atrial fibrillation-we will contact Dr. Oval Linsey to see me in what/if she might be able to come off of Eliquis.  In addition, I have reviewed and discussed with patient certain preventive protocols, quality metrics, and best practice recommendations. A written personalized care plan for preventive services as well as general preventive health recommendations were provided to patient.     Beatrice Lecher, MD  01/15/2017

## 2017-01-15 NOTE — Therapy (Signed)
[place], [place], [address] Phone: [phone]   Fax:  [phone]  Physical Therapy Evaluation  Patient Details  Name: Martha Perkins MRN: [id number] Date of Birth: 03-16-1945 Referring Provider: Dr Dianah Field   Encounter Date: 01/15/2017  PT End of Session - 01/15/17 1151    Visit Number  1    Number of Visits  4    Date for PT Re-Evaluation  02/12/17    PT Start Time  1151    PT Stop Time  1245    PT Time Calculation (min)  54 min    Activity Tolerance  Patient tolerated treatment well       Past Medical History:  Diagnosis Date  . Depression   . Hyperlipidemia   . Hypertension   . Hyperthyroidism 08/09/2015  . Kidney stone   . Paroxysmal atrial fibrillation ([place]) 08/09/2015  . Thyroid disease     Past Surgical History:  Procedure Laterality Date  . CATARACT EXTRACTION Left 09/2012   Dr. Bunnie Philips  . LITHOTRIPSY    . REPLACEMENT TOTAL KNEE BILATERAL  2011  . THYROIDECTOMY  1971  . TUBAL LIGATION  1979    There were no vitals filed for this visit.   Subjective Assessment - 01/15/17 1155    Subjective  Pt having pain  in bilat shoulders for a long time, the most recent flare up was about a couple weeks ago, she had an injection in the Lt shoulder and this helped.  She had prior injections and the Rt hsoulder has longer.     Diagnostic tests  x-rays showed arthritis    Patient Stated Goals  increase her ROM, get rid of some pain    Currently in Pain?  Yes    Pain Score  4     Pain Location  Shoulder    Pain Orientation  Left Rt side is good today   Rt side is good today   Pain Descriptors / Indicators  Aching;Dull    Pain Type  Chronic pain    Pain Onset  More than a month ago    Pain Frequency  Intermittent    Aggravating Factors   reaching, lifting things, lying on her sides is difficult due to pain, unable to sleep on Lt side now, dressing    Pain Relieving Factors   nothing         [facility] PT Assessment - 01/15/17 0001      Assessment   Medical Diagnosis  bilat shoulder OA    Referring Provider  Dr Dianah Field    Onset Date/Surgical Date  12/18/16    Hand Dominance  Right    Next MD Visit  02/12/17    Prior Therapy  yes, a couple years ago      Precautions   Precautions  None      Balance Screen   Has the patient fallen in the past 6 months  Yes    How many times?  2 fell while getting out of bed, thinks it is from the painmed   fell while getting out of bed, thinks it is from the painmed   Has the patient had a decrease in activity level because of a fear of falling?   No    Is the patient reluctant to leave their home because of a fear of falling?   No      Home Film/video editor residence  Living Arrangements  Spouse/significant other      Prior Function   Level of Independence  Independent difficulty washing her back, donning coats   difficulty washing her back, donning coats   Vocation  Retired    Leisure  sing in choir, sign language in church       Observation/Other Assessments   Focus on Therapeutic Outcomes (FOTO)   50% limited      Posture/Postural Control   Posture/Postural Control  Postural limitations    Postural Limitations  Forward head;Rounded Shoulders dowagers hump   dowagers hump     ROM / Strength   AROM / PROM / Strength  AROM;Strength      AROM   AROM Assessment Site  Shoulder;Cervical    Right/Left Shoulder  Left;Right    Right Shoulder Extension  62 Degrees    Right Shoulder Flexion  121 Degrees    Right Shoulder ABduction  109 Degrees    Right Shoulder Internal Rotation  32 Degrees sitting arm 90 degrees abduction   sitting arm 90 degrees abduction   Right Shoulder External Rotation  66 Degrees sitting arm abducted  90 degrees   sitting arm abducted  90 degrees   Left Shoulder Extension  52 Degrees    Left Shoulder Flexion  87 Degrees    Left Shoulder ABduction  78 Degrees     Left Shoulder Internal Rotation  7 Degrees seated arm abducted to 60 degrees   seated arm abducted to 60 degrees   Left Shoulder External Rotation  42 Degrees seated arm abducted to 60 degreees.    seated arm abducted to 60 degreees.    Cervical Flexion  WNL    Cervical Extension  32    Cervical - Right Rotation  38    Cervical - Left Rotation  54      Strength   Strength Assessment Site  Shoulder;Elbow    Right/Left Shoulder  -- 5/5 in available ROM   5/5 in available ROM   Right/Left Elbow  -- WNL   WNL     Flexibility   Soft Tissue Assessment /Muscle Length  -- tight pecs bilat   tight pecs bilat     Palpation   Palpation comment  trigger points in bilat deltoids, tightness in upper traps              Objective measurements completed on examination: See above findings.      [facility] PT Treatment/Exercise - 01/15/17 0001      Exercises   Exercises  Shoulder      Shoulder Exercises: ROM/Strengthening   Other ROM/Strengthening Exercises  AAROM in supine with wand, flexion, ER arms at sides and with arms abducted, standing extension    Other ROM/Strengthening Exercises  10x10sec thoracic lifts      Shoulder Exercises: Stretch   Other Shoulder Stretches  pec stretch with arm straight in the doorway.              PT Education - 01/15/17 1237    Education provided  Yes    Education Details  HEP and shoulder mechanics    Person(s) Educated  Patient    Methods  Explanation;Demonstration;Handout    Comprehension  Returned demonstration;Verbalized understanding          PT Long Term Goals - 01/15/17 1149      PT LONG TERM GOAL #1   Title  Independent with a HEP (02/12/17)    Time  4  Period  Weeks    Status  New      PT LONG TERM GOAL #2   Title  improve bilat shoulder ROM to allow her to reach overhead shelf with no more than 2/10 pain ( 02/12/17)     Time  4    Period  Weeks    Status  New      PT LONG TERM GOAL #3   Title   increase  shoulder ROM to be able to shave her arm pits with minimal difficulty ( 02/12/17)in      Time  4    Period  Weeks    Status  New      PT LONG TERM GOAL #4   Title  report =/> 50% reduction in pain in her shoulders with bathing and dressing ( 02/12/17)     Time  4    Period  Weeks    Status  New      PT LONG TERM GOAL #5   Title  improve FOTO =/< 39% limited, CJ level ( 02/12/17)     Time  4    Period  Weeks    Status  New             Plan - 02/10/2017 1248    Clinical Impression Statement  71 yo female with bilat shoulder pain due to severe OA.  She wishes to improve her ROM and possibly decrease her pain to avoid having shoulder replacements.  She has limited ROM in both shoulders, postural changes and pain with ADLs.  Her UE strength is good in her available ROM.  [place] also has trigger points and tightness in her deltoids and upper shoulder/neck muscles.     Clinical Presentation  Stable    Clinical Decision Making  Low    Rehab Potential  Excellent for goals set   for goals set   PT Frequency  1x / week    PT Duration  4 weeks    PT Treatment/Interventions  Dry needling;Manual techniques;Moist Heat;Ultrasound;Therapeutic activities;Patient/family education;Taping;Cryotherapy;Electrical Stimulation;Passive range of motion    PT Next Visit Plan  ROM bilat shoulders, postural re-ed ther ex, DN deltoids, upper traps, pecs PRN     Consulted and Agree with Plan of Care  Patient       Patient will benefit from skilled therapeutic intervention in order to improve the following deficits and impairments:  Pain, Postural dysfunction, Decreased range of motion, Impaired UE functional use  Visit Diagnosis: Chronic left shoulder pain - Plan: PT plan of care cert/re-cert  Chronic right shoulder pain - Plan: PT plan of care cert/re-cert  Abnormal posture - Plan: PT plan of care cert/re-cert  [facility] PT PB G-CODES - 02/10/17 1247    Functional Assessment Tool Used   FOTO and professional  judgement    Functional Limitations  Carrying, moving and handling objects    Carrying, Moving and Handling Objects Current Status [phone])  At least 40 percent but less than 60 percent impaired, limited or restricted    Carrying, Moving and Handling Objects Goal Status (G9562)  At least 20 percent but less than 40 percent impaired, limited or restricted        Problem List Patient Active Problem List   Diagnosis Date Noted  . Hypothyroidism 09/11/2016  . Persistent atrial fibrillation ([place])   . 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/14/2016  . Restrictive lung disease 09/28/2015  . Lumbar degenerative disc disease 08/15/2015  . Chronic allergic rhinitis 05/17/2014  .  Cataract 06/03/2012  . Hyperlipidemia LDL goal <100 12/21/2010  . Hypertension 12/21/2010  . GERD (gastroesophageal reflux disease) 07/18/2010  . Insulin resistance 07/18/2010  . Fatty liver 07/18/2010    Jeral Pinch PT  01/15/2017, 1:00 PM  [facility] [place], [place], [address] Phone: [phone]   Fax:  [phone]  Name: Martha Perkins MRN: [id number] Date of Birth: 1945/08/16

## 2017-01-15 NOTE — Patient Instructions (Addendum)
Trigger Point Dry Needling  . What is Trigger Point Dry Needling (DN)? o DN is a physical therapy technique used to treat muscle pain and dysfunction. Specifically, DN helps deactivate muscle trigger points (muscle knots).  o A thin filiform needle is used to penetrate the skin and stimulate the underlying trigger point. The goal is for a local twitch response (LTR) to occur and for the trigger point to relax. No medication of any kind is injected during the procedure.   . What Does Trigger Point Dry Needling Feel Like?  o The procedure feels different for each individual patient. Some patients report that they do not actually feel the needle enter the skin and overall the process is not painful. Very mild bleeding may occur. However, many patients feel a deep cramping in the muscle in which the needle was inserted. This is the local twitch response.   Marland Kitchen How Will I feel after the treatment? o Soreness is normal, and the onset of soreness may not occur for a few hours. Typically this soreness does not last longer than two days.  o Bruising is uncommon, however; ice can be used to decrease any possible bruising.  o In rare cases feeling tired or nauseous after the treatment is normal. In addition, your symptoms may get worse before they get better, this period will typically not last longer than 24 hours.   . What Can I do After My Treatment? o Increase your hydration by drinking more water for the next 24 hours. o You may place ice or heat on the areas treated that have become sore, however, do not use heat on inflamed or bruised areas. Heat often brings more relief post needling. o You can continue your regular activities, but vigorous activity is not recommended initially after the treatment for 24 hours. o DN is best combined with other physical therapy such as strengthening, stretching, and other therapies.   ROM: Flexion - Wand (Supine)    Lie on back holding wand. Raise arms over head.    Repeat __15__ times per set. Do __1__ sets per session. Do __1__ sessions per day.   ROM: External / Internal Rotation - Wand    Holding wand with left hand palm up, push out from body with other hand, palm down. Keep both elbows bent. When stretch is felt, hold __1__ seconds. Repeat to other side, leading with same hand. Keep elbows bent. Repeat ___15_ times per set. Do _1___ sets per session. Do __1__ sessions per day.  ROM: External / Internal Rotation - Wand    Bring wand up over head, then down toward waistline. Hold each position __1__ seconds. Repeat __15__ times per set. Do __1__ sets per session. Do __1__ sessions per day.  ROM: Extension - Wand (Standing)    Stand holding wand behind back. Raise arms as far as possible. Keep chest lifted, don't lean forward.  Repeat __15__ times per set. Do __1__ sets per session. Do __1__ sessions per day.  ELBOW: Biceps - Standing    Standing in doorway, place one hand on wall, elbow straight. Lean forward. Hold __30-45_ seconds. __2_ reps per set, _1__ sets per day, _1__ days per week. Repeat on the other side  Thoracic Lift    Press shoulders down. Then lift mid-thoracic spine (area between the shoulder blades). Lift the breastbone slightly. Hold _10__ seconds. Relax. Repeat _10__ times. Repeat once a day.

## 2017-01-15 NOTE — Patient Instructions (Signed)
Get some over-the-counter Debrox drops for your left ear to help remove the wax. Call about the Shingrix vaccine and see if your insurance would like to get it here at the doctor's office. We will try to call Dr. Oval Linsey and see when you can come off of your Eliquis. Just encourage you to stay active and try to get as much exercise as you are able to with your shoulder problems.

## 2017-01-16 ENCOUNTER — Telehealth: Payer: Self-pay | Admitting: Family Medicine

## 2017-01-16 NOTE — Telephone Encounter (Signed)
Called patient: Discussed with her that I heard back from Dr. Oval Linsey, her cardiologist.  I think it would be reasonable if she is able to remain euthyroid and asymptomatic in regards to her A. fib for 6 months or that at that point we can order a 30-day monitor just to make sure that she is not expensing any paroxysmal atrial fibrillation and if that is normal then we can consider taking her off.  Knowing though that overall she is at higher risk because of her enlarged right atrium.  She is aware of this and she is also aware of Dr. Blenda Mounts preference to keep her on the Eliquis long-term.  In April we can order the 30-day event monitor if she remains euthyroid and she does not experience any A. fib symptoms.  Beatrice Lecher, MD

## 2017-01-22 ENCOUNTER — Encounter: Payer: Self-pay | Admitting: Physical Therapy

## 2017-01-22 ENCOUNTER — Ambulatory Visit: Payer: Medicare Other | Admitting: Physical Therapy

## 2017-01-22 DIAGNOSIS — M25512 Pain in left shoulder: Secondary | ICD-10-CM

## 2017-01-22 DIAGNOSIS — R293 Abnormal posture: Secondary | ICD-10-CM | POA: Diagnosis not present

## 2017-01-22 DIAGNOSIS — M25511 Pain in right shoulder: Secondary | ICD-10-CM | POA: Diagnosis not present

## 2017-01-22 DIAGNOSIS — G8929 Other chronic pain: Secondary | ICD-10-CM

## 2017-01-22 NOTE — Therapy (Signed)
[place], [place], [address] Phone: [phone]   Fax:  [phone]  Physical Therapy Treatment  Patient Details  Name: Martha Perkins MRN: [id number] Date of Birth: 01-14-1946 Referring Provider: Dr Dianah Field   Encounter Date: 01/22/2017  PT End of Session - 01/22/17 1446    Visit Number  2    Number of Visits  4    Date for PT Re-Evaluation  02/12/17    PT Start Time  4604    PT Stop Time  7998    PT Time Calculation (min)  58 min    Activity Tolerance  Patient tolerated treatment well       Past Medical History:  Diagnosis Date  . Depression   . Hyperlipidemia   . Hypertension   . Hyperthyroidism 08/09/2015  . Kidney stone   . Paroxysmal atrial fibrillation ([place]) 08/09/2015  . Thyroid disease     Past Surgical History:  Procedure Laterality Date  . CARDIOVERSION N/A 08/05/2016   Procedure: CARDIOVERSION;  Surgeon: Skeet Latch, MD;  Location: [facility];  Service: Cardiovascular;  Laterality: N/A;  . CATARACT EXTRACTION Left 09/2012   Dr. Bunnie Philips  . LITHOTRIPSY    . REPLACEMENT TOTAL KNEE BILATERAL  2011  . THYROIDECTOMY  1971  . TUBAL LIGATION  1979    There were no vitals filed for this visit.  Subjective Assessment - 01/22/17 1446    Subjective  Pt reports she has been sore, keeps having some back issues with low back pain after doing her exercises,  it lasts until the next day.     Patient Stated Goals  increase her ROM, get rid of some pain    Currently in Pain?  Yes    Pain Score  2  up to 5/10 with doffing her coat    Pain Location  Shoulder    Pain Orientation  Left;Right Lt worse     Pain Descriptors / Indicators  Aching;Dull    Pain Type  Chronic pain    Pain Onset  More than a month ago    Pain Frequency  Intermittent    Aggravating Factors   reaching and moving the arms    Pain Relieving Factors  nothing                      [facility]  Adult PT Treatment/Exercise - 01/22/17 0001      Self-Care   Self-Care  Other Self-Care Comments    Other Self-Care Comments   reviewed HEP, recommend she keep her knees bent during supine ex to protect back, for doorway stetch will turn body away from hand instead of stepping forward      Shoulder Exercises: ROM/Strengthening   UBE (Upper Arm Bike)  L1 x4' alt FWD/BWD      Modalities   Modalities  Electrical Stimulation;Moist Heat      Moist Heat Therapy   Number Minutes Moist Heat  20 Minutes    Moist Heat Location  -- bilat upper arms      Electrical Stimulation   Electrical Stimulation Location  bilat upper arms    Electrical Stimulation Action  premod    Electrical Stimulation Parameters  to tolerance    Electrical Stimulation Goals  Pain;Tone      Manual Therapy   Manual Therapy  Soft tissue mobilization    Soft tissue mobilization  STM to bilat upper arms with trigger point release .  Trigger Point Dry Needling - 01/22/17 1455    Consent Given?  Yes    Education Handout Provided  Yes    Muscles Treated Upper Body  -- bilat deltoid and Rt biceps good ressponse, Lt tighter                 PT Long Term Goals - 01/22/17 1528      PT LONG TERM GOAL #1   Title  Independent with a HEP (02/12/17)    Status  On-going      PT LONG TERM GOAL #2   Title  improve bilat shoulder ROM to allow her to reach overhead shelf with no more than 2/10 pain ( 02/12/17)     Status  On-going      PT LONG TERM GOAL #3   Title   increase shoulder ROM to be able to shave her arm pits with minimal difficulty ( 02/12/17)in      Status  On-going      PT LONG TERM GOAL #4   Title  report =/> 50% reduction in pain in her shoulders with bathing and dressing ( 02/12/17)     Status  On-going      PT LONG TERM GOAL #5   Title  improve FOTO =/< 39% limited, CJ level ( 02/12/17)     Status  On-going            Plan - 01/22/17 1527    Clinical Impression Statement  this is  Martha Perkins's second visit , no goals met. Tolerated treatment well and had to make modifications to her HEP as it was irritating her low back.     Rehab Potential  Excellent    PT Frequency  1x / week    PT Duration  4 weeks    PT Treatment/Interventions  Dry needling;Manual techniques;Moist Heat;Ultrasound;Therapeutic activities;Patient/family education;Taping;Cryotherapy;Electrical Stimulation;Passive range of motion    PT Next Visit Plan  assess response to DN and manual work, perform on cervical area next       Patient will benefit from skilled therapeutic intervention in order to improve the following deficits and impairments:  Pain, Postural dysfunction, Decreased range of motion, Impaired UE functional use  Visit Diagnosis: Chronic left shoulder pain  Chronic right shoulder pain  Abnormal posture     Problem List Patient Active Problem List   Diagnosis Date Noted  . Hypothyroidism 09/11/2016  . Persistent atrial fibrillation ([place])   . 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/14/2016  . Restrictive lung disease 09/28/2015  . Lumbar degenerative disc disease 08/15/2015  . Chronic allergic rhinitis 05/17/2014  . Cataract 06/03/2012  . Hyperlipidemia LDL goal <100 12/21/2010  . Hypertension 12/21/2010  . GERD (gastroesophageal reflux disease) 07/18/2010  . Insulin resistance 07/18/2010  . Fatty liver 07/18/2010    Jeral Pinch PT  01/22/2017, 3:29 PM  [facility] [place], [place], [address] Phone: [phone]   Fax:  [phone]  Name: Martha Perkins MRN: [id number] Date of Birth: 03/14/1945

## 2017-01-27 ENCOUNTER — Ambulatory Visit: Payer: Medicare Other | Admitting: Rehabilitative and Restorative Service Providers"

## 2017-01-27 ENCOUNTER — Encounter: Payer: Self-pay | Admitting: Rehabilitative and Restorative Service Providers"

## 2017-01-27 DIAGNOSIS — M25512 Pain in left shoulder: Secondary | ICD-10-CM | POA: Diagnosis not present

## 2017-01-27 DIAGNOSIS — M25511 Pain in right shoulder: Secondary | ICD-10-CM | POA: Diagnosis not present

## 2017-01-27 DIAGNOSIS — M545 Low back pain: Secondary | ICD-10-CM | POA: Diagnosis not present

## 2017-01-27 DIAGNOSIS — R29898 Other symptoms and signs involving the musculoskeletal system: Secondary | ICD-10-CM

## 2017-01-27 DIAGNOSIS — G4452 New daily persistent headache (NDPH): Secondary | ICD-10-CM

## 2017-01-27 DIAGNOSIS — R293 Abnormal posture: Secondary | ICD-10-CM | POA: Diagnosis not present

## 2017-01-27 DIAGNOSIS — M542 Cervicalgia: Secondary | ICD-10-CM | POA: Diagnosis not present

## 2017-01-27 DIAGNOSIS — G8929 Other chronic pain: Secondary | ICD-10-CM | POA: Diagnosis not present

## 2017-01-27 NOTE — Therapy (Addendum)
[place], [place], [address] Phone: [phone]   Fax:  [phone]  Physical Therapy Treatment  Patient Details  Name: Martha Perkins MRN: [id number] Date of Birth: 01/31/1946 Referring Provider: Dr Dianah Field   Encounter Date: 01/27/2017  PT End of Session - 01/27/17 1658    Visit Number  3    Number of Visits  4    Date for PT Re-Evaluation  02/12/17    PT Start Time  1410    PT Stop Time  1458    PT Time Calculation (min)  48 min    Activity Tolerance  Patient tolerated treatment well       Past Medical History:  Diagnosis Date  . Depression   . Hyperlipidemia   . Hypertension   . Hyperthyroidism 08/09/2015  . Kidney stone   . Paroxysmal atrial fibrillation ([place]) 08/09/2015  . Thyroid disease     Past Surgical History:  Procedure Laterality Date  . CARDIOVERSION N/A 08/05/2016   Procedure: CARDIOVERSION;  Surgeon: Skeet Latch, MD;  Location: [facility];  Service: Cardiovascular;  Laterality: N/A;  . CATARACT EXTRACTION Left 09/2012   Dr. Bunnie Philips  . LITHOTRIPSY    . REPLACEMENT TOTAL KNEE BILATERAL  2011  . THYROIDECTOMY  1971  . TUBAL LIGATION  1979    There were no vitals filed for this visit.  Subjective Assessment - 01/27/17 1408    Subjective  Some increased soreness following the DN but is feeling better now - still sore. Notices pain when she moves a certain way. Rt shoulder hurts worse after the needling but she feeels the needling helps. She notices that both arms arms are not as tight. Feels she needs the needling in the neck area as well.     Currently in Pain?  Yes    Pain Score  3     Pain Location  Shoulder    Pain Orientation  Right;Left    Pain Descriptors / Indicators  Aching;Dull    Pain Radiating Towards  into neck area and down into arms to elbows                       [facility] Adult PT Treatment/Exercise - 01/27/17 0001       Modalities   Modalities  Electrical Stimulation;Moist Heat      Moist Heat Therapy   Number Minutes Moist Heat  20 Minutes    Moist Heat Location  -- bilat upper arms      Electrical Stimulation   Electrical Stimulation Location  bilat cervical to upper traps    Electrical Stimulation Action  IFC    Electrical Stimulation Parameters  to tolerance    Electrical Stimulation Goals  Pain;Tone      Manual Therapy   Manual Therapy  Soft tissue mobilization;Myofascial release    Manual therapy comments  pt sidelying for DN and manual work     Soft tissue mobilization  working through the lateral and posterior cervical musculature into the upper traps bilat        Trigger Point Dry Needling - 01/27/17 1652    Consent Given?  Yes    Muscles Treated Upper Body  -- bilat - pt sidelying Rt/Lt     Upper Trapezius Response  Palpable increased muscle length    Levator Scapulae Response  Palpable increased muscle length    Longissimus Response  Palpable increased muscle length  PT Long Term Goals - 01/22/17 1528      PT LONG TERM GOAL #1   Title  Independent with a HEP (02/12/17)    Status  On-going      PT LONG TERM GOAL #2   Title  improve bilat shoulder ROM to allow her to reach overhead shelf with no more than 2/10 pain ( 02/12/17)     Status  On-going      PT LONG TERM GOAL #3   Title   increase shoulder ROM to be able to shave her arm pits with minimal difficulty ( 02/12/17)in      Status  On-going      PT LONG TERM GOAL #4   Title  report =/> 50% reduction in pain in her shoulders with bathing and dressing ( 02/12/17)     Status  On-going      PT LONG TERM GOAL #5   Title  improve FOTO =/< 39% limited, CJ level ( 02/12/17)     Status  On-going            Plan - 01/27/17 1653    Clinical Impression Statement  Soreness reported following DN but patient feelt the DN helped loosen tissue. She tolerated DN through bilat upper traps and posterior cervical  musculature without difficulty. Persistent tightness noted through cervical and shoulder girdle musculature - Rt > Lt deltoid and biceps area.     Rehab Potential  Excellent    PT Frequency  1x / week    PT Duration  4 weeks    PT Treatment/Interventions  Dry needling;Manual techniques;Moist Heat;Ultrasound;Therapeutic activities;Patient/family education;Taping;Cryotherapy;Electrical Stimulation;Passive range of motion    PT Next Visit Plan  continue to assess response to DN and manual work; progress exercise as indicated (only 30 min appt today)     Consulted and Agree with Plan of Care  Patient       Patient will benefit from skilled therapeutic intervention in order to improve the following deficits and impairments:  Pain, Postural dysfunction, Decreased range of motion, Impaired UE functional use  Visit Diagnosis: Chronic left shoulder pain  Chronic right shoulder pain  Abnormal posture  Acute left-sided low back pain, with sciatica presence unspecified  New daily persistent headache  Cervicalgia  Other symptoms and signs involving the musculoskeletal system     Problem List Patient Active Problem List   Diagnosis Date Noted  . Hypothyroidism 09/11/2016  . Persistent atrial fibrillation ([place])   . 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/14/2016  . Restrictive lung disease 09/28/2015  . Lumbar degenerative disc disease 08/15/2015  . Chronic allergic rhinitis 05/17/2014  . Cataract 06/03/2012  . Hyperlipidemia LDL goal <100 12/21/2010  . Hypertension 12/21/2010  . GERD (gastroesophageal reflux disease) 07/18/2010  . Insulin resistance 07/18/2010  . Fatty liver 07/18/2010    Martha Perkins Nilda Simmer PT, MPH  01/27/2017, 4:59 PM  [facility] [place], [place], [address] Phone: [phone]   Fax:  [phone]  Name: Martha Perkins MRN: [id number] Date of Birth: [birth year]  PHYSICAL THERAPY DISCHARGE  SUMMARY  Visits from Start of Care: 3  Current functional level related to goals / functional outcomes: See last progress note for discharge status. Patient progressing well with PT but has not scheduled additional visits since 01/27/17 visit. POC has expired.    Remaining deficits: Some persistent tightness through the neck and shoulders    Education / Equipment: HEP  Plan: Patient agrees to discharge.  Patient goals were partially met. Patient is being discharged due to not returning since the last visit.  ?????     Martha Perkins P. Helene Kelp PT, MPH 02/16/17 2:29 PM

## 2017-02-03 ENCOUNTER — Encounter: Payer: Self-pay | Admitting: Physical Therapy

## 2017-02-09 ENCOUNTER — Encounter: Payer: Self-pay | Admitting: Family Medicine

## 2017-02-09 ENCOUNTER — Ambulatory Visit (INDEPENDENT_AMBULATORY_CARE_PROVIDER_SITE_OTHER): Payer: Medicare Other | Admitting: Family Medicine

## 2017-02-09 VITALS — BP 126/56 | HR 61 | Temp 98.4°F | Ht 62.0 in

## 2017-02-09 DIAGNOSIS — R300 Dysuria: Secondary | ICD-10-CM | POA: Diagnosis not present

## 2017-02-09 DIAGNOSIS — E8881 Metabolic syndrome: Secondary | ICD-10-CM | POA: Diagnosis not present

## 2017-02-09 DIAGNOSIS — N3000 Acute cystitis without hematuria: Secondary | ICD-10-CM | POA: Diagnosis not present

## 2017-02-09 LAB — POCT URINALYSIS DIPSTICK
Bilirubin, UA: NEGATIVE
Glucose, UA: 100
KETONES UA: NEGATIVE
Nitrite, UA: POSITIVE
PH UA: 5.5 (ref 5.0–8.0)
Urobilinogen, UA: 2 E.U./dL — AB

## 2017-02-09 LAB — POCT GLYCOSYLATED HEMOGLOBIN (HGB A1C): Hemoglobin A1C: 5.5

## 2017-02-09 MED ORDER — SULFAMETHOXAZOLE-TRIMETHOPRIM 800-160 MG PO TABS: 1.0000 | ORAL_TABLET | Freq: Two times a day (BID) | ORAL | 0 refills | Status: DC

## 2017-02-09 MED ORDER — APIXABAN 5 MG PO TABS: 5.0000 mg | ORAL_TABLET | Freq: Two times a day (BID) | ORAL | 0 refills | Status: DC

## 2017-02-09 MED ORDER — APIXABAN 5 MG PO TABS: 5.0000 mg | ORAL_TABLET | Freq: Two times a day (BID) | ORAL | 6 refills | Status: DC

## 2017-02-09 NOTE — Progress Notes (Signed)
   Subjective:    Patient ID: Martha Perkins, female    DOB: 21-Sep-1945, 71 y.o.   MRN: [id number]  HPI 71 year old female comes in today complaining of urinary symptoms that started yesterday.  She is having some suprapubic pain and discomfort as well as some cramps and some spasms and severe burning with urination.  No fevers chills or sweats.  She has been trying some Tucks pads and cortisone cream to try to get a little relief externally.  No blood in the urine. No worsening or alleviating factors.   Impaired fasting glucose-no increased thirst or urination. No symptoms consistent with hypoglycemia.    Review of Systems     Objective:   Physical Exam  Constitutional: She appears well-developed.  Cardiovascular: Normal rate and regular rhythm.  Pulmonary/Chest: Effort normal and breath sounds normal.  Musculoskeletal:  No CVA tenderness  Psychiatric: She has a normal mood and affect.          Assessment & Plan:  Urinary tract infection-we will treat with Bactrim DS times 3 days.  Drink plenty of water and call if not improved after 3 days.  Glucosuria - A1c was 5.5 today which is awesome.  Which is continue to monitor every 6-12 months.

## 2017-02-12 ENCOUNTER — Ambulatory Visit: Payer: Self-pay | Admitting: Sports Medicine

## 2017-02-19 ENCOUNTER — Ambulatory Visit: Payer: Self-pay | Admitting: Sports Medicine

## 2017-03-10 DIAGNOSIS — C801 Malignant (primary) neoplasm, unspecified: Secondary | ICD-10-CM

## 2017-03-10 HISTORY — DX: Malignant (primary) neoplasm, unspecified: C80.1

## 2017-03-18 ENCOUNTER — Encounter: Payer: Self-pay | Admitting: Physician Assistant

## 2017-03-18 ENCOUNTER — Ambulatory Visit (INDEPENDENT_AMBULATORY_CARE_PROVIDER_SITE_OTHER): Payer: Medicare Other | Admitting: Physician Assistant

## 2017-03-18 VITALS — BP 140/56 | HR 64 | Ht 62.0 in | Wt 199.0 lb

## 2017-03-18 DIAGNOSIS — J209 Acute bronchitis, unspecified: Secondary | ICD-10-CM

## 2017-03-18 DIAGNOSIS — J4521 Mild intermittent asthma with (acute) exacerbation: Secondary | ICD-10-CM

## 2017-03-18 DIAGNOSIS — J984 Other disorders of lung: Secondary | ICD-10-CM

## 2017-03-18 MED ORDER — PREDNISONE 20 MG PO TABS: ORAL_TABLET | ORAL | 0 refills | Status: DC

## 2017-03-18 MED ORDER — ALBUTEROL SULFATE HFA 108 (90 BASE) MCG/ACT IN AERS: 2.0000 | INHALATION_SPRAY | Freq: Four times a day (QID) | RESPIRATORY_TRACT | 2 refills | Status: DC | PRN

## 2017-03-18 NOTE — Progress Notes (Addendum)
Subjective:    Patient ID: Martha Perkins, female    DOB: 1946/02/02, 72 y.o.   MRN: [id number]  HPI Pt is a 72 year old female with restrictive lung disease and  presenting to the clinic with wheezing, coughing, and some shortness of breath. She reports these symptoms began yesterday evening after she woke up from a nap with coughing, wheezing, and a "raw" feeling that goes from the her throat to her chest. She has had some hoarseness and feels that her chest is a little heavy due to the difficulty breathing. She has used albuterol two puffs last night and two this morning To relieve her symptoms. She feels like this worked great and got her back to 6 percent. She reports that this has happened before with her past and usually these symptoms lead to bronchitis.no fever, chills, body aches.   She denies productive cough or difficulty swallowing.   .. Active Ambulatory Problems    Diagnosis Date Noted  . GERD (gastroesophageal reflux disease) 07/18/2010  . Insulin resistance 07/18/2010  . Fatty liver 07/18/2010  . Hyperlipidemia LDL goal <100 12/21/2010  . Hypertension 12/21/2010  . Cataract 06/03/2012  . Chronic allergic rhinitis 05/17/2014  . Lumbar degenerative disc disease 08/15/2015  . Restrictive lung disease 09/28/2015  . 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/14/2016  . Persistent atrial fibrillation ([place])   . Hypothyroidism 09/11/2016   Resolved Ambulatory Problems    Diagnosis Date Noted  . Hypertension 07/18/2010  . Hyperlipidemia 07/18/2010  . Acute maxillary sinusitis 03/09/2012  . Injury of right rotator cuff 12/29/2013  . Paroxysmal atrial fibrillation ([place]) 08/09/2015   Past Medical History:  Diagnosis Date  . Depression   . Hyperlipidemia   . Hypertension   . Hyperthyroidism 08/09/2015  . Kidney stone   . Paroxysmal atrial fibrillation ([place]) 08/09/2015  . Thyroid disease       Review of Systems  Constitutional: Negative for chills, fatigue and fever.   HENT: Positive for postnasal drip. Negative for congestion, ear pain, rhinorrhea, sinus pressure and sinus pain.   Respiratory: Positive for cough, shortness of breath and wheezing.        Objective:   Physical Exam  Constitutional: She is oriented to person, place, and time. She appears well-developed and well-nourished. No distress.  HENT:  Head: Normocephalic and atraumatic.  Right Ear: External ear normal.  Left Ear: External ear normal.  Some cerumen build up in the left ear.   Eyes: Conjunctivae are normal. Right eye exhibits no discharge. Left eye exhibits no discharge.  Cardiovascular: Normal rate and regular rhythm. Exam reveals no gallop and no friction rub.  No murmur heard. Pulmonary/Chest: Effort normal. She has wheezes.  Bilateral expiratory lung wheezing.   Neurological: She is alert and oriented to person, place, and time.  Psychiatric: She has a normal mood and affect. Her behavior is normal.   Vitals:   03/18/17 1130  BP: (!) 140/56  Pulse: 64  SpO2: 98%          Assessment & Plan:   Martha Perkins was seen today for cough.  Diagnoses and all orders for this visit:  Mild intermittent reactive airway disease with acute exacerbation -     albuterol (PROAIR HFA) 108 (90 Base) MCG/ACT inhaler; Inhale 2 puffs into the lungs every 6 (six) hours as needed for wheezing or shortness of breath. -     predniSONE (DELTASONE) 20 MG tablet; Take 3 tablets for 3 days, take 2 tablets for 3 days,  take 1 tablet for 3 days, take 1/2 tablet for 4 days.  Restrictive lung disease  Acute bronchitis, unspecified organism   Martha Perkins was given an educational handout on bronchitis and has been instructed treatment with prednisone and albuterol. Pt should call or return to the office if symptoms do not improve or get worse.

## 2017-03-18 NOTE — Patient Instructions (Signed)

## 2017-03-27 ENCOUNTER — Other Ambulatory Visit: Payer: Self-pay | Admitting: Physician Assistant

## 2017-03-27 ENCOUNTER — Telehealth: Payer: Self-pay

## 2017-03-27 MED ORDER — HYDROCODONE-HOMATROPINE 5-1.5 MG/5ML PO SYRP: 5.0000 mL | ORAL_SOLUTION | Freq: Two times a day (BID) | ORAL | 0 refills | Status: DC | PRN

## 2017-03-27 MED ORDER — AZITHROMYCIN 250 MG PO TABS: ORAL_TABLET | ORAL | 0 refills | Status: DC

## 2017-03-27 NOTE — Telephone Encounter (Signed)
Ok I did talk to her about abx if she didn't improve. Sent zpak and hycodan. Follow up if not improving.

## 2017-03-27 NOTE — Telephone Encounter (Addendum)
Patient calling in requesting cough medicine be electronically sent to [facility] in [facility]. Patient saw on 03/18/17 - stating she was advised that if she needed a cough syrup/medicine to just call. Patient did request antibiotics as well but was advised that she would need an office visit for Rx Antibiotics.

## 2017-03-27 NOTE — Telephone Encounter (Deleted)
Patient was seen on 03/18/17 for cough. Patient states she was advised if she needed cough syrup/medicine to just call.   Patient did request antibiotics as well but I advised her she would need to be seen for Rx Antibiotics.

## 2017-04-01 NOTE — Telephone Encounter (Signed)
Left message asking how she is feeling.

## 2017-04-02 NOTE — Telephone Encounter (Signed)
Called patient - advised of provider's notation. Patient states she is taking abx and cough syrup - feeling much better.

## 2017-04-16 ENCOUNTER — Other Ambulatory Visit: Payer: Self-pay | Admitting: Family Medicine

## 2017-04-27 ENCOUNTER — Other Ambulatory Visit: Payer: Self-pay | Admitting: Family Medicine

## 2017-05-21 ENCOUNTER — Encounter: Payer: Self-pay | Admitting: Family Medicine

## 2017-05-21 ENCOUNTER — Ambulatory Visit (INDEPENDENT_AMBULATORY_CARE_PROVIDER_SITE_OTHER): Payer: Medicare Other | Admitting: Family Medicine

## 2017-05-21 VITALS — BP 134/58 | HR 58 | Temp 97.8°F | Ht 62.0 in | Wt 199.0 lb

## 2017-05-21 DIAGNOSIS — J019 Acute sinusitis, unspecified: Secondary | ICD-10-CM | POA: Diagnosis not present

## 2017-05-21 MED ORDER — CEFDINIR 300 MG PO CAPS: 300.0000 mg | ORAL_CAPSULE | Freq: Two times a day (BID) | ORAL | 0 refills | Status: DC

## 2017-05-21 NOTE — Progress Notes (Signed)
   Subjective:    Patient ID: Martha Perkins, female    DOB: April 18, 1945, 72 y.o.   MRN: [id number]  HPI  72 year old female comes in today complaining of right sided head pain that she feels is over the face and ear area.  It started about 10 days ago.  No fever, chills or sweats.  No cough.  She has chronic post nasal drip.  That she feels like the pains moving around.  Sometimes it radiates down into her teeth.  Sometimes it is more in her ear and sometimes it is in her right side of her head or near her eye.  Is just been persistent and she really has not gotten any significant relief.  She is on Flonase year round for her allergies.  She denies any significant nasal congestion but does have chronic postnasal drip.      Review of Systems     Objective:   Physical Exam  Constitutional: She is oriented to person, place, and time. She appears well-developed and well-nourished.  HENT:  Head: Normocephalic and atraumatic.  Right Ear: External ear normal.  Left Ear: External ear normal.  Nose: Nose normal.  Mouth/Throat: Oropharynx is clear and moist.  TMs and canals are clear. Mild swelling over the right facial cheek.  Possible retraction of the right TM but no erythema or lumen.  He does have a tooth with a large feeling in her right lower jaw but do not see any gum edema.  Eyes: Conjunctivae and EOM are normal. Pupils are equal, round, and reactive to light.  Neck: Neck supple. No thyromegaly present.  Cardiovascular: Normal rate, regular rhythm and normal heart sounds.  Pulmonary/Chest: Effort normal and breath sounds normal. She has no wheezes.  Lymphadenopathy:    She has no cervical adenopathy.  Neurological: She is alert and oriented to person, place, and time.  Skin: Skin is warm and dry.  Psychiatric: She has a normal mood and affect.         Assessment & Plan:  Right-sided facial and head pain-most consistent with sinusitis-we will treat with Omnicef since she was on  Augmentin last month.  If not significantly better in 1 week then please call and refer her to ENT for further evaluation.

## 2017-05-26 ENCOUNTER — Other Ambulatory Visit: Payer: Self-pay | Admitting: *Deleted

## 2017-05-26 MED ORDER — KETOCONAZOLE 2 % EX CREA: TOPICAL_CREAM | Freq: Every day | CUTANEOUS | 3 refills | Status: DC | PRN

## 2017-05-29 ENCOUNTER — Telehealth: Payer: Self-pay

## 2017-05-29 MED ORDER — AMOXICILLIN 500 MG PO CAPS: 2000.0000 mg | ORAL_CAPSULE | Freq: Once | ORAL | 0 refills | Status: AC

## 2017-05-29 NOTE — Telephone Encounter (Signed)
Rx sent. Sent enoughfor 2 rounds

## 2017-05-29 NOTE — Telephone Encounter (Signed)
Martha Perkins did see a Dentist about her jaw pain. She needs a root canal. She will have to go twice for dental procedures. She requests pretreatment antibiotic. Please advise. [facility] [place].

## 2017-05-29 NOTE — Telephone Encounter (Signed)
Left message advising of new prescription sent to pharmacy.

## 2017-06-04 ENCOUNTER — Other Ambulatory Visit: Payer: Self-pay | Admitting: Cardiovascular Disease

## 2017-06-04 NOTE — Telephone Encounter (Signed)
Please review for refill, thanks ! 

## 2017-07-16 ENCOUNTER — Ambulatory Visit (INDEPENDENT_AMBULATORY_CARE_PROVIDER_SITE_OTHER): Payer: Medicare Other | Admitting: Family Medicine

## 2017-07-16 ENCOUNTER — Encounter: Payer: Self-pay | Admitting: Family Medicine

## 2017-07-16 VITALS — BP 135/53 | HR 77 | Ht 62.0 in | Wt 198.0 lb

## 2017-07-16 DIAGNOSIS — E890 Postprocedural hypothyroidism: Secondary | ICD-10-CM | POA: Diagnosis not present

## 2017-07-16 DIAGNOSIS — I10 Essential (primary) hypertension: Secondary | ICD-10-CM

## 2017-07-16 DIAGNOSIS — I481 Persistent atrial fibrillation: Secondary | ICD-10-CM

## 2017-07-16 DIAGNOSIS — E88819 Insulin resistance, unspecified: Secondary | ICD-10-CM

## 2017-07-16 DIAGNOSIS — E8881 Metabolic syndrome: Secondary | ICD-10-CM

## 2017-07-16 DIAGNOSIS — I4819 Other persistent atrial fibrillation: Secondary | ICD-10-CM

## 2017-07-16 DIAGNOSIS — K760 Fatty (change of) liver, not elsewhere classified: Secondary | ICD-10-CM

## 2017-07-16 LAB — COMPLETE METABOLIC PANEL WITH GFR
AG Ratio: 1.9 (calc) (ref 1.0–2.5)
ALKALINE PHOSPHATASE (APISO): 85 U/L (ref 33–130)
ALT: 22 U/L (ref 6–29)
AST: 18 U/L (ref 10–35)
Albumin: 4.7 g/dL (ref 3.6–5.1)
BUN: 13 mg/dL (ref 7–25)
CALCIUM: 10.1 mg/dL (ref 8.6–10.4)
CHLORIDE: 107 mmol/L (ref 98–110)
CO2: 22 mmol/L (ref 20–32)
CREATININE: 0.64 mg/dL (ref 0.50–0.99)
GFR, Est African American: 106 mL/min/{1.73_m2} (ref >=60)
GFR, Est Non African American: 91 mL/min/{1.73_m2} (ref >=60)
Globulin: 2.5 g/dL (calc) (ref 1.9–3.7)
Glucose, Bld: 152 mg/dL — ABNORMAL HIGH (ref 65–99)
Potassium: 3.8 mmol/L (ref 3.5–5.3)
SODIUM: 139 mmol/L (ref 135–146)
Total Bilirubin: 0.9 mg/dL (ref 0.2–1.2)
Total Protein: 7.2 g/dL (ref 6.1–8.1)

## 2017-07-16 LAB — POCT GLYCOSYLATED HEMOGLOBIN (HGB A1C): HEMOGLOBIN A1C: 5.4

## 2017-07-16 LAB — TSH: TSH: 1.95 m[IU]/L (ref 0.40–4.50)

## 2017-07-16 NOTE — Patient Instructions (Signed)
Call in June for labs for cholesterol.

## 2017-07-16 NOTE — Progress Notes (Signed)
Subjective:    CC: BP, afib   HPI:  Hypertension- Pt denies chest pain, SOB, dizziness, or heart palpitations.  Taking meds as directed w/o problems.  Denies medication side effects.    F/U Afib -overall she is doing well.  She says she still occasionally has episodes where she feels a little fluttering in her chest but is not really heart racing or palpitations.  She says it usually just lasts a few seconds here and there more so when she is stressed.  She does not really feel like she is in atrial fibrillation.  She denies any chest pain or shortness of breath.  She is very interested in coming off the Eliquis because of cost and also because she wants to be able to take NSAIDs for her arthritis and joint pain.  Right now she is mostly using Tylenol for her joint pain but she is very cautious about that as well because she also has fatty liver disease.  Fatty liver disease-  Right now she is mostly using Tylenol for her joint pain but she is very cautious about that as well because she also has fatty liver disease.  Impaired fasting glucose-no increased thirst or urination. No symptoms consistent with hypoglycemia.  Had a root canal yesterday and is on steroids.  Hypothyroidism - doing well overall. Taking medication as prescribed daily in the morning away from vitamins and supplement.s   Past medical history, Surgical history, Family history not pertinant except as noted below, Social history, Allergies, and medications have been entered into the medical record, reviewed, and corrections made.   Review of Systems: No fevers, chills, night sweats, weight loss, chest pain, or shortness of breath.   Objective:    General: Well Developed, well nourished, and in no acute distress.  Neuro: Alert and oriented x3, extra-ocular muscles intact, sensation grossly intact.  HEENT: Normocephalic, atraumatic  Skin: Warm and dry, no rashes. Cardiac: Regular rate and rhythm, no murmurs rubs or gallops,  no lower extremity edema.  Respiratory: Clear to auscultation bilaterally. Not using accessory muscles, speaking in full sentences.   Impression and Recommendations:    HTN - Well controlled. Continue current regimen. Follow up in  6 months.    AFIB - STAble.  We will look to see if we might be able to get her off of the Coumadin.  It may be worth doing a heart monitor for a month to see if she goes into A. fib during that timeframe.  She could also be a candidate for aspirin therapy based on her chads fast score.  We will calculate that out.  IFG - Well controlled. Continue current regimen. Follow up in  12 months.    Thyroidism-last TSH was okay but she wants to go ahead and recheck it today.  Fatty liver disease-due to recheck liver enzymes.

## 2017-07-20 ENCOUNTER — Ambulatory Visit (INDEPENDENT_AMBULATORY_CARE_PROVIDER_SITE_OTHER): Payer: Medicare Other | Admitting: Family Medicine

## 2017-07-20 ENCOUNTER — Encounter: Payer: Self-pay | Admitting: Family Medicine

## 2017-07-20 ENCOUNTER — Ambulatory Visit (INDEPENDENT_AMBULATORY_CARE_PROVIDER_SITE_OTHER): Payer: Medicare Other

## 2017-07-20 VITALS — BP 135/82 | HR 68 | Ht 62.0 in | Wt 197.0 lb

## 2017-07-20 DIAGNOSIS — M25561 Pain in right knee: Secondary | ICD-10-CM

## 2017-07-20 DIAGNOSIS — R6884 Jaw pain: Secondary | ICD-10-CM | POA: Diagnosis not present

## 2017-07-20 DIAGNOSIS — M25562 Pain in left knee: Secondary | ICD-10-CM

## 2017-07-20 DIAGNOSIS — S8991XA Unspecified injury of right lower leg, initial encounter: Secondary | ICD-10-CM | POA: Diagnosis not present

## 2017-07-20 DIAGNOSIS — R51 Headache: Secondary | ICD-10-CM

## 2017-07-20 DIAGNOSIS — S8992XA Unspecified injury of left lower leg, initial encounter: Secondary | ICD-10-CM | POA: Diagnosis not present

## 2017-07-20 DIAGNOSIS — R519 Headache, unspecified: Secondary | ICD-10-CM

## 2017-07-20 NOTE — Progress Notes (Signed)
Subjective:    Patient ID: Martha Perkins, female    DOB: May 27, 1945, 72 y.o.   MRN: [id number]  HPI 72 year old female comes in today after recent fall 2 days ago on Friday night.  It actually just seen her about a week ago for regular follow-up.  She does have atrial fibrillation and is on blood thinners, Eliquis for this.  Pt reports that she fell on friday night she fell and injured the Lside of her jaw, and both knees, she c/o L sided hip pain, and headaches. she has been using tylenol and ice.  She actually missed a step and fell forward.  Most of the impact was on her left knee but some on her right knee as well.  She has a little soreness in the left hip.  She had a glass of milk in her hand and so the glass actually hit her left lower jaw although the glass did not break.  She does not remember actually hitting her head but she has had some headaches since the injury on Friday night.  Has bilateral knee replacements.  She denies any neck pain.   .Review of Systems BP 135/82   Pulse 68   Ht 5\' 2"  (1.575 m)   Wt 197 lb (89.4 kg)   SpO2 98%   BMI 36.03 kg/m     Allergies  Allergen Reactions  . Demerol Nausea Only  . Meperidine Hcl Other (See Comments)    Feels loopy  . Oxycodone-Aspirin Other (See Comments)    Feels loopy  . Percocet [Oxycodone-Acetaminophen] Other (See Comments)    Feels loopy    Past Medical History:  Diagnosis Date  . Depression   . Hyperlipidemia   . Hypertension   . Hyperthyroidism 08/09/2015  . Kidney stone   . Paroxysmal atrial fibrillation ([place]) 08/09/2015  . Thyroid disease     Past Surgical History:  Procedure Laterality Date  . CARDIOVERSION N/A 08/05/2016   Procedure: CARDIOVERSION;  Surgeon: Skeet Latch, MD;  Location: [facility];  Service: Cardiovascular;  Laterality: N/A;  . CATARACT EXTRACTION Left 09/2012   Dr. Bunnie Philips  . LITHOTRIPSY    . REPLACEMENT TOTAL KNEE BILATERAL  2011  . THYROIDECTOMY  1971  . TUBAL LIGATION   1979    Social History   Socioeconomic History  . Marital status: Married    Spouse name: Not on file  . Number of children: Not on file  . Years of education: Not on file  . Highest education level: Not on file  Occupational History  . Occupation: Retired Therapist, sports.   Social Needs  . Financial resource strain: Not on file  . Food insecurity:    Worry: Not on file    Inability: Not on file  . Transportation needs:    Medical: Not on file    Non-medical: Not on file  Tobacco Use  . Smoking status: Never Smoker  . Smokeless tobacco: Never Used  Substance and Sexual Activity  . Alcohol use: No  . Drug use: No  . Sexual activity: Not on file  Lifestyle  . Physical activity:    Days per week: Not on file    Minutes per session: Not on file  . Stress: Not on file  Relationships  . Social connections:    Talks on phone: Not on file    Gets together: Not on file    Attends religious service: Not on file    Active member of club or organization: Not  on file    Attends meetings of clubs or organizations: Not on file    Relationship status: Not on file  . Intimate partner violence:    Fear of current or ex partner: Not on file    Emotionally abused: Not on file    Physically abused: Not on file    Forced sexual activity: Not on file  Other Topics Concern  . Not on file  Social History Narrative  . Not on file    Family History  Problem Relation Age of Onset  . Coronary artery disease Mother 51       deceased  . Depression Mother   . Hyperlipidemia Mother   . Diabetes Paternal Grandmother   . Thyroid disease Sister     Outpatient Encounter Medications as of 07/20/2017  Medication Sig  . acetaminophen (TYLENOL) 650 MG CR tablet Take 1 tablet (650 mg total) by mouth every 8 (eight) hours.  Marland Kitchen albuterol (PROAIR HFA) 108 (90 Base) MCG/ACT inhaler Inhale 2 puffs into the lungs every 6 (six) hours as needed for wheezing or shortness of breath.  Marland Kitchen apixaban (ELIQUIS) 5 MG TABS  tablet Take 1 tablet (5 mg total) by mouth 2 (two) times daily.  Marland Kitchen atorvastatin (LIPITOR) 40 MG tablet TAKE 1 TABLET BY MOUTH  DAILY  . diltiazem (CARDIZEM CD) 240 MG 24 hr capsule TAKE 1 CAPSULE BY MOUTH  DAILY  . esomeprazole (NEXIUM) 40 MG capsule Take 1 capsule (40 mg total) by mouth daily.  . fluticasone (FLONASE) 50 MCG/ACT nasal spray USE 1 SPRAY IN EACH NOSTRIL TWO TIMES DAILY  . furosemide (LASIX) 20 MG tablet Take 1 tablet (20 mg total) by mouth daily as needed (SWELLING).  Marland Kitchen HYDROcodone-acetaminophen (NORCO/VICODIN) 5-325 MG tablet Take 1 tablet by mouth every 8 (eight) hours as needed for moderate pain.  Marland Kitchen ketoconazole (NIZORAL) 2 % cream Apply topically daily as needed for irritation.  Marland Kitchen levothyroxine (SYNTHROID, LEVOTHROID) 112 MCG tablet Take 1 tablet (112 mcg total) by mouth daily before breakfast.  . metoprolol succinate (TOPROL-XL) 50 MG 24 hr tablet TAKE 1 TABLET BY MOUTH  EVERY DAY WITH OR  IMMEDIATELY FOLLOWING A  MEAL  . traMADol (ULTRAM) 50 MG tablet TAKE 1 TABLET BY MOUTH 3  TIMES DAILY AS NEEDED   No facility-administered encounter medications on file as of 07/20/2017.          Objective:   Physical Exam  Constitutional: She appears well-developed and well-nourished.  HENT:  Head: Normocephalic and atraumatic.  Nose: Nose normal.  Mouth/Throat: Oropharynx is clear and moist.  Right TM canal is clear.  Left canal is blocked by cerumen.  She does have a couple of red marks underneath her left eye where she thinks her glasses actually hit her face that her glasses did not break.  She is not expensing any bruising.  Nontender around the orbit over the bone.  Eyes: Pupils are equal, round, and reactive to light. Conjunctivae and EOM are normal.  Musculoskeletal:  Right knee with normal flexion and extension.  Left knee with normal flexion and just slightly decreased extension but she says that this is more chronic.  She has significant swelling and edema and increased  warmth over the left knee with a huge bruise pretty much over the front of the entire knee and going down her shin.  She has a small abrasion just below the knee on the left side.  She has just a little bit of swelling over the right  knee and bruising as well.  Ankles with normal flexion and extension strength in her knees and ankles is 5 out of 5 bilaterally.  Skin:  Pretty large blue bruise over her left lower jaw just lateral to the chin.  Jawbone itself feels intact.  No popping or cracking over the TMJ joints bilaterally.          Assessment & Plan:  Bilat knee pain  -her left knee is much worse compared to her right.  She has had bilateral complete knee replacements I do want to get x-rays today just to rule out any damage to the hardware.  In the meantime just continue with elevation and icing.  I think she would probably benefit from an Ace wrap particularly over that left knee.  Okay to use Tylenol for pain relief as needed.  Left jaw pain-I think it was just a external contusion.  She does not remember biting her tongue and does not have any tooth pain today.  She actually has a regular follow-up with her dentist at the end of the week.  Headaches-she does not remember actually hitting her head just the jaw on the glass.  She does not have any goose eggs or anything like that.  I think traumatic brain injury or bleed on the brain would be diet very unlikely.  Suspect more of a concussion type injury.  Just continue to monitor.  If she feels that the headaches are lasting longer or getting worse or she is noticing any new neurologic symptoms then please let me know.

## 2017-07-22 ENCOUNTER — Telehealth: Payer: Self-pay | Admitting: Emergency Medicine

## 2017-07-22 NOTE — Telephone Encounter (Signed)
Patient calling for results of knee xray; informed her nml results. Patient also asked for advise; her jaw still sore from her fall and scheduled for crown placement tomorrow; suggested she postpone until jaw feeling better.

## 2017-07-28 ENCOUNTER — Ambulatory Visit (INDEPENDENT_AMBULATORY_CARE_PROVIDER_SITE_OTHER): Payer: Medicare Other

## 2017-07-28 ENCOUNTER — Ambulatory Visit (INDEPENDENT_AMBULATORY_CARE_PROVIDER_SITE_OTHER): Payer: Medicare Other | Admitting: Family Medicine

## 2017-07-28 ENCOUNTER — Encounter: Payer: Self-pay | Admitting: Family Medicine

## 2017-07-28 VITALS — BP 123/70 | HR 70 | Temp 98.3°F | Ht 62.0 in | Wt 198.0 lb

## 2017-07-28 DIAGNOSIS — M25561 Pain in right knee: Secondary | ICD-10-CM

## 2017-07-28 DIAGNOSIS — R51 Headache: Secondary | ICD-10-CM | POA: Diagnosis not present

## 2017-07-28 DIAGNOSIS — M7989 Other specified soft tissue disorders: Secondary | ICD-10-CM | POA: Diagnosis not present

## 2017-07-28 DIAGNOSIS — T148XXA Other injury of unspecified body region, initial encounter: Secondary | ICD-10-CM | POA: Diagnosis not present

## 2017-07-28 DIAGNOSIS — M25572 Pain in left ankle and joints of left foot: Secondary | ICD-10-CM

## 2017-07-28 DIAGNOSIS — M25562 Pain in left knee: Secondary | ICD-10-CM | POA: Diagnosis not present

## 2017-07-28 DIAGNOSIS — R519 Headache, unspecified: Secondary | ICD-10-CM

## 2017-07-28 DIAGNOSIS — S99912A Unspecified injury of left ankle, initial encounter: Secondary | ICD-10-CM | POA: Diagnosis not present

## 2017-07-28 NOTE — Progress Notes (Signed)
Subjective:    Patient ID: Martha Perkins, female    DOB: October 07, 1945, 72 y.o.   MRN: [id number]  HPI 72 year old female is here today to follow-up on her left knee pain in particular.  Please see previous note.  She had a pretty significant fall and landed on both knees and had big contusions on both.  The left was definitely worse than the right.  She still has a lot of swelling over the knee and has been icing it pretty aggressively.  She still has significant swelling.  Now she is noticing more swelling in her left ankle and pain in her left ankle when she walks near the medial and lateral malleoli.  She is able to walk on it but says it just feels really sore.  She still has some swelling over the left lower jaw where she had some bruising from her fall.  She notices it more when she just reaches up to touch her face.  Particularly on her left knee the skin is just very sensitive and tender.  She is also had some increased redness in her lower legs.  She says ever since her knee replacement she is always had some redness but it does seem more dramatic.  No fevers chills or sweats. Still having some headaches but no lightheadedness or dizziness or speech changes.   Review of Systems  BP 123/70   Pulse 70   Temp 98.3 F (36.8 C)   Ht 5\' 2"  (1.575 m)   Wt 198 lb (89.8 kg)   SpO2 99%   BMI 36.21 kg/m     Allergies  Allergen Reactions  . Demerol Nausea Only  . Meperidine Hcl Other (See Comments)    Feels loopy  . Oxycodone-Aspirin Other (See Comments)    Feels loopy  . Percocet [Oxycodone-Acetaminophen] Other (See Comments)    Feels loopy    Past Medical History:  Diagnosis Date  . Depression   . Hyperlipidemia   . Hypertension   . Hyperthyroidism 08/09/2015  . Kidney stone   . Paroxysmal atrial fibrillation ([place]) 08/09/2015  . Thyroid disease     Past Surgical History:  Procedure Laterality Date  . CARDIOVERSION N/A 08/05/2016   Procedure: CARDIOVERSION;  Surgeon: Skeet Latch, MD;  Location: [facility];  Service: Cardiovascular;  Laterality: N/A;  . CATARACT EXTRACTION Left 09/2012   Dr. Bunnie Philips  . LITHOTRIPSY    . REPLACEMENT TOTAL KNEE BILATERAL  2011  . THYROIDECTOMY  1971  . TUBAL LIGATION  1979    Social History   Socioeconomic History  . Marital status: Married    Spouse name: Not on file  . Number of children: Not on file  . Years of education: Not on file  . Highest education level: Not on file  Occupational History  . Occupation: Retired Therapist, sports.   Social Needs  . Financial resource strain: Not on file  . Food insecurity:    Worry: Not on file    Inability: Not on file  . Transportation needs:    Medical: Not on file    Non-medical: Not on file  Tobacco Use  . Smoking status: Never Smoker  . Smokeless tobacco: Never Used  Substance and Sexual Activity  . Alcohol use: No  . Drug use: No  . Sexual activity: Not on file  Lifestyle  . Physical activity:    Days per week: Not on file    Minutes per session: Not on file  . Stress: Not  on file  Relationships  . Social connections:    Talks on phone: Not on file    Gets together: Not on file    Attends religious service: Not on file    Active member of club or organization: Not on file    Attends meetings of clubs or organizations: Not on file    Relationship status: Not on file  . Intimate partner violence:    Fear of current or ex partner: Not on file    Emotionally abused: Not on file    Physically abused: Not on file    Forced sexual activity: Not on file  Other Topics Concern  . Not on file  Social History Narrative  . Not on file    Family History  Problem Relation Age of Onset  . Coronary artery disease Mother 51       deceased  . Depression Mother   . Hyperlipidemia Mother   . Diabetes Paternal Grandmother   . Thyroid disease Sister     Outpatient Encounter Medications as of 07/28/2017  Medication Sig  . acetaminophen (TYLENOL) 650 MG CR tablet Take 1  tablet (650 mg total) by mouth every 8 (eight) hours.  Marland Kitchen albuterol (PROAIR HFA) 108 (90 Base) MCG/ACT inhaler Inhale 2 puffs into the lungs every 6 (six) hours as needed for wheezing or shortness of breath.  Marland Kitchen apixaban (ELIQUIS) 5 MG TABS tablet Take 1 tablet (5 mg total) by mouth 2 (two) times daily.  Marland Kitchen atorvastatin (LIPITOR) 40 MG tablet TAKE 1 TABLET BY MOUTH  DAILY  . diltiazem (CARDIZEM CD) 240 MG 24 hr capsule TAKE 1 CAPSULE BY MOUTH  DAILY  . esomeprazole (NEXIUM) 40 MG capsule Take 1 capsule (40 mg total) by mouth daily.  . fluticasone (FLONASE) 50 MCG/ACT nasal spray USE 1 SPRAY IN EACH NOSTRIL TWO TIMES DAILY  . furosemide (LASIX) 20 MG tablet Take 1 tablet (20 mg total) by mouth daily as needed (SWELLING).  Marland Kitchen HYDROcodone-acetaminophen (NORCO/VICODIN) 5-325 MG tablet Take 1 tablet by mouth every 8 (eight) hours as needed for moderate pain.  Marland Kitchen ketoconazole (NIZORAL) 2 % cream Apply topically daily as needed for irritation.  Marland Kitchen levothyroxine (SYNTHROID, LEVOTHROID) 112 MCG tablet Take 1 tablet (112 mcg total) by mouth daily before breakfast.  . metoprolol succinate (TOPROL-XL) 50 MG 24 hr tablet TAKE 1 TABLET BY MOUTH  EVERY DAY WITH OR  IMMEDIATELY FOLLOWING A  MEAL  . traMADol (ULTRAM) 50 MG tablet TAKE 1 TABLET BY MOUTH 3  TIMES DAILY AS NEEDED   No facility-administered encounter medications on file as of 07/28/2017.          Objective:   Physical Exam  Constitutional: She is oriented to person, place, and time. She appears well-developed and well-nourished.  HENT:  Head: Normocephalic and atraumatic.  Eyes: Conjunctivae and EOM are normal.  Cardiovascular: Normal rate.  Pulmonary/Chest: Effort normal.  Musculoskeletal:  Left knee with a large amount of bruising and hematoma over the anterior patella.  She has normal flexion and extension.  Left ankle with 1+ pitting edema.  Normal range of motion and strength of the ankle.  Just a little tender over the medial and lateral  malleoli.  He still has significant bruising over her right knee as well.  And she has some erythema and increased warmth over the shin on the left side as well is on her right shin.  It is much larger area on the left side.  It is not well  demarcated though.  In fact the borders faded.  Neurological: She is alert and oriented to person, place, and time.  Skin: Skin is dry. No pallor.  She still has a swollen knot area over the left jaw with some bruising.  Is tender to touch.  Psychiatric: She has a normal mood and affect. Her behavior is normal.  Vitals reviewed.        Assessment & Plan:  Left knee pain status post impact on the knee after a fall.  She still has significant bruising and swelling.  Is continue to elevate and ice.  If it starts to feel little better and she can actually start wearing an Ace wrap that it would be helpful.  This is probably cannot take another 4 to 6 weeks to heal and even at that point she will probably still have some residual swelling.  I also encouraged her to try to massage the area that does not as it does look like she has a hematoma and to keep it from calcifying massage is very helpful.  Hematoma over the left lower jaw-again recommend gentle massage to help break up the hematoma.  Left ankle pain-we will get an x-ray since she is having persistent pain.  It may just be from the onset of swelling causing some stretching and discomfort of the skin and soft tissues but it may also be that she actually injured it.  Her knees were hurting so bad that she may not have recognized the pain in the ankles initially.  Will call with results once available.  Headaches-I think the risk for a cranial bleed is still low.  But I do want her to keep an eye on this.  If they become worse or she develops any new neurologic symptoms then please let us know.

## 2017-07-31 ENCOUNTER — Other Ambulatory Visit: Payer: Self-pay | Admitting: Family Medicine

## 2017-08-10 ENCOUNTER — Telehealth: Payer: Self-pay

## 2017-08-10 MED ORDER — DOXYCYCLINE HYCLATE 100 MG PO TABS: 100.0000 mg | ORAL_TABLET | Freq: Two times a day (BID) | ORAL | 0 refills | Status: DC

## 2017-08-10 NOTE — Telephone Encounter (Signed)
Tomorrow is the first appointment.

## 2017-08-10 NOTE — Telephone Encounter (Signed)
Script sent for doxy for possible skin infection.  When is her dental procedure. I would rather not combine antibiotics.

## 2017-08-10 NOTE — Telephone Encounter (Signed)
Martha Perkins called and reports her left leg swelling and redness is worse. Denies fever, chills or sweats. She has appointment on Thursday but worries that appointment is not soon enough.   She also is having dental procedure twice for a crown. She wants pretreatment for both visits. Please advise.

## 2017-08-11 ENCOUNTER — Telehealth: Payer: Self-pay

## 2017-08-11 MED ORDER — AMOXICILLIN 500 MG PO CAPS: 2000.0000 mg | ORAL_CAPSULE | Freq: Once | ORAL | 1 refills | Status: DC | PRN

## 2017-08-11 MED ORDER — AMOXICILLIN 500 MG PO CAPS: 2000.0000 mg | ORAL_CAPSULE | Freq: Three times a day (TID) | ORAL | 1 refills | Status: DC

## 2017-08-11 NOTE — Telephone Encounter (Signed)
Sent Rx for Amox 2 grams with RF.

## 2017-08-11 NOTE — Telephone Encounter (Signed)
I apologize.  Please let them know that they can discard the prescription and I sent a new with him with a corrected sig on it.

## 2017-08-11 NOTE — Telephone Encounter (Signed)
Called pt to inform her of medication but no voicemail is set up. Will call back later. W.Rudell Ortman, CCMA

## 2017-08-11 NOTE — Telephone Encounter (Signed)
Thank you! Pharmacy contacted. [facility], [place]

## 2017-08-12 NOTE — Telephone Encounter (Signed)
Patient did pick up prescription.

## 2017-08-13 ENCOUNTER — Ambulatory Visit: Payer: Self-pay | Admitting: Family Medicine

## 2017-08-20 ENCOUNTER — Ambulatory Visit (INDEPENDENT_AMBULATORY_CARE_PROVIDER_SITE_OTHER): Payer: Medicare Other | Admitting: Family Medicine

## 2017-08-20 ENCOUNTER — Encounter: Payer: Self-pay | Admitting: Family Medicine

## 2017-08-20 ENCOUNTER — Telehealth: Payer: Self-pay | Admitting: Family Medicine

## 2017-08-20 VITALS — BP 138/63 | HR 71 | Ht 62.0 in | Wt 199.0 lb

## 2017-08-20 DIAGNOSIS — T148XXA Other injury of unspecified body region, initial encounter: Secondary | ICD-10-CM

## 2017-08-20 DIAGNOSIS — L309 Dermatitis, unspecified: Secondary | ICD-10-CM

## 2017-08-20 DIAGNOSIS — M79644 Pain in right finger(s): Secondary | ICD-10-CM | POA: Diagnosis not present

## 2017-08-20 DIAGNOSIS — Z8679 Personal history of other diseases of the circulatory system: Secondary | ICD-10-CM

## 2017-08-20 DIAGNOSIS — M7989 Other specified soft tissue disorders: Secondary | ICD-10-CM

## 2017-08-20 MED ORDER — CLOTRIMAZOLE-BETAMETHASONE 1-0.05 % EX CREA: 1.0000 "application " | TOPICAL_CREAM | Freq: Every day | CUTANEOUS | 0 refills | Status: DC

## 2017-08-20 NOTE — Telephone Encounter (Signed)
Please call patient: It has been a year since her ablation and it was done in May 2018 and since she has not had any recurrence of A. fib since then then okay to come off the Eliquis.  She has any problems at all please let us know and we can always get a cardiac monitor placed if needed.  Since we do feel like the thyroid is what caused the A. fib and we have a good explanation, now that her thyroid is under good control she is much less likely to go back into A. fib.  So again, okay to discontinue Eliquis.

## 2017-08-20 NOTE — Telephone Encounter (Signed)
Patient advised of recommendations. She has agreed to stop the Eliquis.

## 2017-08-20 NOTE — Progress Notes (Signed)
Subjective:    Patient ID: Martha Perkins, female    DOB: 1945-12-21, 72 y.o.   MRN: [id number]  HPI 72 year old female comes in today to follow-up for increased swelling in her left ankle that occurred after a recent fall.  I had recommended elevation and icing and Ace wrap.  He then called back around March 3 about a week later, reporting that the left leg swelling was worse and that it was now red.  She was supposed to have a dental procedure done that week and was concerned.  Was sent over prescription for doxycycline for skin infection as well as amoxicillin for prophylaxis for her dental procedure.  Overall her legs do look better but still a little bit erythematous and still some blanching and swelling.  She has 1 more day of the doxycycline.  Unfortunately she did have some difficulty after her root canal and had a lot of pain and tenderness.  She went back to see the endodontist and they felt like it was coming from a different tooth.  Overall her pain is better than it was.  Fibrillation-she has not been in A. fib for months at this point and wants to know when she might be able to come off of the Eliquis.  She actually had a cardioversion for the A. fib in May 2018 so she has actually been on the Eliquis for a little over a year since the cardioversion and has not had any recurrent episodes.  She said occasionally she will feel a flip-flop in her heart but it literally just last 1 or 2 seconds.  Is not persistent in any way and she said she was experiencing that intermittently before having A. fib.  The A. fib was felt to be triggered by her hyperthyroidism which is now under control.  He still having some soreness particularly in the third and fourth fingers in her right hand.  When she fell a month ago she actually jammed those fingers.  There is still a little sore and tight but feels like they are getting a little better.  Like me to recheck the hematoma on her left inner knee as well.  Is  also been experiencing some peeling particularly of the skin around the edges of her feet.  Some occasional itching but not very bothersome.  She is been trying some over-the-counter antifungal cream.  Review of Systems     BP 138/63   Pulse 71   Ht 5\' 2"  (1.575 m)   Wt 199 lb (90.3 kg)   BMI 36.40 kg/m     Allergies  Allergen Reactions  . Demerol Nausea Only  . Meperidine Hcl Other (See Comments)    Feels loopy  . Oxycodone-Aspirin Other (See Comments)    Feels loopy  . Percocet [Oxycodone-Acetaminophen] Other (See Comments)    Feels loopy    Past Medical History:  Diagnosis Date  . Depression   . Hyperlipidemia   . Hypertension   . Hyperthyroidism 08/09/2015  . Kidney stone   . Paroxysmal atrial fibrillation ([place]) 08/09/2015  . Thyroid disease     Past Surgical History:  Procedure Laterality Date  . CARDIOVERSION N/A 08/05/2016   Procedure: CARDIOVERSION;  Surgeon: Skeet Latch, MD;  Location: [facility];  Service: Cardiovascular;  Laterality: N/A;  . CATARACT EXTRACTION Left 09/2012   Dr. Bunnie Philips  . LITHOTRIPSY    . REPLACEMENT TOTAL KNEE BILATERAL  2011  . THYROIDECTOMY  1971  . [place]  Social History   Socioeconomic History  . Marital status: Married    Spouse name: Not on file  . Number of children: Not on file  . Years of education: Not on file  . Highest education level: Not on file  Occupational History  . Occupation: Retired Therapist, sports.   Social Needs  . Financial resource strain: Not on file  . Food insecurity:    Worry: Not on file    Inability: Not on file  . Transportation needs:    Medical: Not on file    Non-medical: Not on file  Tobacco Use  . Smoking status: Never Smoker  . Smokeless tobacco: Never Used  Substance and Sexual Activity  . Alcohol use: No  . Drug use: No  . Sexual activity: Not on file  Lifestyle  . Physical activity:    Days per week: Not on file    Minutes per session: Not on file  . Stress:  Not on file  Relationships  . Social connections:    Talks on phone: Not on file    Gets together: Not on file    Attends religious service: Not on file    Active member of club or organization: Not on file    Attends meetings of clubs or organizations: Not on file    Relationship status: Not on file  . Intimate partner violence:    Fear of current or ex partner: Not on file    Emotionally abused: Not on file    Physically abused: Not on file    Forced sexual activity: Not on file  Other Topics Concern  . Not on file  Social History Narrative  . Not on file    Family History  Problem Relation Age of Onset  . Coronary artery disease Mother 44       deceased  . Depression Mother   . Hyperlipidemia Mother   . Diabetes Paternal Grandmother   . Thyroid disease Sister     Outpatient Encounter Medications as of 08/20/2017  Medication Sig  . acetaminophen (TYLENOL) 650 MG CR tablet Take 1 tablet (650 mg total) by mouth every 8 (eight) hours.  Marland Kitchen albuterol (PROAIR HFA) 108 (90 Base) MCG/ACT inhaler Inhale 2 puffs into the lungs every 6 (six) hours as needed for wheezing or shortness of breath.  Marland Kitchen atorvastatin (LIPITOR) 40 MG tablet TAKE 1 TABLET BY MOUTH  DAILY  . diltiazem (CARDIZEM CD) 240 MG 24 hr capsule TAKE 1 CAPSULE BY MOUTH  DAILY  . esomeprazole (NEXIUM) 40 MG capsule Take 1 capsule (40 mg total) by mouth daily.  . fluticasone (FLONASE) 50 MCG/ACT nasal spray USE 1 SPRAY IN EACH NOSTRIL TWO TIMES DAILY  . furosemide (LASIX) 20 MG tablet Take 1 tablet (20 mg total) by mouth daily as needed (SWELLING).  Marland Kitchen ketoconazole (NIZORAL) 2 % cream Apply topically daily as needed for irritation.  Marland Kitchen levothyroxine (SYNTHROID, LEVOTHROID) 112 MCG tablet Take 1 tablet (112 mcg total) by mouth daily before breakfast.  . metoprolol succinate (TOPROL-XL) 50 MG 24 hr tablet TAKE 1 TABLET BY MOUTH  EVERY DAY WITH OR  IMMEDIATELY FOLLOWING A  MEAL  . traMADol (ULTRAM) 50 MG tablet TAKE 1 TABLET BY  MOUTH 3  TIMES DAILY AS NEEDED  . [DISCONTINUED] apixaban (ELIQUIS) 5 MG TABS tablet Take 1 tablet (5 mg total) by mouth 2 (two) times daily.  . clotrimazole-betamethasone (LOTRISONE) cream Apply 1 application topically daily.  . [DISCONTINUED] amoxicillin (AMOXIL) 500 MG capsule Take 4  capsules (2,000 mg total) by mouth once as needed for up to 1 dose. For procedure  . [DISCONTINUED] doxycycline (VIBRA-TABS) 100 MG tablet Take 1 tablet (100 mg total) by mouth 2 (two) times daily.  . [DISCONTINUED] HYDROcodone-acetaminophen (NORCO/VICODIN) 5-325 MG tablet Take 1 tablet by mouth every 8 (eight) hours as needed for moderate pain.   No facility-administered encounter medications on file as of 08/20/2017.       Objective:   Physical Exam  Constitutional: She is oriented to person, place, and time. She appears well-developed and well-nourished.  HENT:  Head: Normocephalic and atraumatic.  Cardiovascular: Normal rate, regular rhythm and normal heart sounds.  Pulmonary/Chest: Effort normal and breath sounds normal.  Musculoskeletal:  1+ pitting edema of both legs to her knees with some erythema  Neurological: She is alert and oriented to person, place, and time.  Skin: Skin is warm and dry.  Hematoma on left jawline and left inner knee actually look like they are healing great.  They are not completely resolved but they are improving.  Just continue to massage the areas gently.  She still having some sensitivity over the one on her knee.  Psychiatric: She has a normal mood and affect. Her behavior is normal.        Assessment & Plan:  Lateral lower extremity edema-recommend that she get some compression stockings.  She said she is to wear them years ago when she worked.  Also encouraged her to start her Lasix daily through the weekend and see if that helps as well.  She does have a prescription that she just uses occasionally.  I want to see if that will help.  If she needs to take it more  consistently then we will need to check a BMP and make sure she does not need to be on potassium as well.  History of atrial Fibrillation secondary to hyperthyroidism-we will check to see if she might be able to come off of Eliquis that she has not been in A. fib for quite some time.  After review of her chart she has been A. fib free for 1 year status post cardiac ablation in May 2018.  Okay to discontinue Eliquis.  Foot dermatitis-unclear if tinea pedis versus eczema.  It is not very red which is more typical of tinea pedis we will treat with Lotrisone.  If not improving then please let me know.  Pain in fourth and third fingers and right hand after jamming them.  We can always get an x-ray to rule out fracture.  She says it is getting a little better and will hold off but will call me if pain persists.

## 2017-08-21 MED ORDER — MELOXICAM 15 MG PO TABS: 15.0000 mg | ORAL_TABLET | Freq: Every day | ORAL | 2 refills | Status: DC

## 2017-08-21 NOTE — Telephone Encounter (Signed)
I apologize for the confusion.  The notes that post ablation and I did not realize it was referring to palpate versus an ablation for the A. fib.  I apologize and thank you for letting me know.  I still think it is okay to come off the Eliquis.  It does not have to be tapered she can just stop it.  And we can certainly restart the meloxicam.  Please look on historical med list and please send refill.

## 2017-08-21 NOTE — Telephone Encounter (Signed)
Patient wanted to know if she should just stop the Eliquis. She also states she didn't have an ablation. She had radioactive iodine for the thyroid twice. Also she wanted to know if she can go back on meloxicam for arthritis. If so she would like a refill sent to [facility]. Please advise.

## 2017-08-21 NOTE — Telephone Encounter (Signed)
Patient advised and refill sent.

## 2017-08-21 NOTE — Addendum Note (Signed)
Addended by: Narda Rutherford on: 08/21/2017 11:06 AM   Modules accepted: Orders

## 2017-09-04 ENCOUNTER — Other Ambulatory Visit: Payer: Self-pay | Admitting: *Deleted

## 2017-09-04 MED ORDER — MELOXICAM 15 MG PO TABS: 15.0000 mg | ORAL_TABLET | Freq: Every day | ORAL | 2 refills | Status: DC

## 2017-10-15 ENCOUNTER — Other Ambulatory Visit: Payer: Self-pay | Admitting: Family Medicine

## 2017-11-02 ENCOUNTER — Ambulatory Visit (INDEPENDENT_AMBULATORY_CARE_PROVIDER_SITE_OTHER): Payer: Medicare Other | Admitting: Osteopathic Medicine

## 2017-11-02 ENCOUNTER — Encounter: Payer: Self-pay | Admitting: Osteopathic Medicine

## 2017-11-02 ENCOUNTER — Ambulatory Visit: Payer: Self-pay | Admitting: Physician Assistant

## 2017-11-02 VITALS — BP 142/55 | HR 62 | Temp 97.9°F | Wt 201.2 lb

## 2017-11-02 DIAGNOSIS — L989 Disorder of the skin and subcutaneous tissue, unspecified: Secondary | ICD-10-CM | POA: Diagnosis not present

## 2017-11-02 NOTE — Progress Notes (Signed)
HPI: Martha Perkins is a 72 y.o. female who  has a past medical history of Depression, Hyperlipidemia, Hypertension, Hyperthyroidism (08/09/2015), Kidney stone, Paroxysmal atrial fibrillation ([place]) (08/09/2015), and Thyroid disease.  she presents to [facility] today, 11/02/17,  for chief complaint of:  Scalp concern  Concern for scalp irritation/bump. Noticed about 8 weeks ago. Not too bothersome for her but occasinally with catch it when she's doing her hair.   HTN: forgot BP meds today, BP a little high. No CP/SOB.     Past medical history, surgical history, and family history reviewed.  Current medication list and allergy/intolerance information reviewed.   (See remainder of HPI, ROS, Phys Exam below)    ASSESSMENT/PLAN:  Lesion of skin of scalp - uncertain benign seb-ker/warty growth vs squamous cell cancer or unusual actinic change - given location wil lrefer to derm ?excision vs shave vs cryo?     Patient Instructions  Will send to dermatology to determine if this is something benign we can freeze off, or if something more suspicious that will require removal either with complete skin excision or shaving the lesion off.    Follow-up plan: Return for routine care as directed by Dr Madilyn Fireman .               ############################################ ############################################ ############################################ ############################################    Outpatient Encounter Medications as of 11/02/2017  Medication Sig  . acetaminophen (TYLENOL) 650 MG CR tablet Take 1 tablet (650 mg total) by mouth every 8 (eight) hours.  Marland Kitchen albuterol (PROAIR HFA) 108 (90 Base) MCG/ACT inhaler Inhale 2 puffs into the lungs every 6 (six) hours as needed for wheezing or shortness of breath.  Marland Kitchen atorvastatin (LIPITOR) 40 MG tablet TAKE 1 TABLET BY MOUTH  DAILY  . clotrimazole-betamethasone (LOTRISONE) cream Apply 1  application topically daily.  Marland Kitchen diltiazem (CARDIZEM CD) 240 MG 24 hr capsule TAKE 1 CAPSULE BY MOUTH  DAILY  . esomeprazole (NEXIUM) 40 MG capsule Take 1 capsule (40 mg total) by mouth daily.  . fluticasone (FLONASE) 50 MCG/ACT nasal spray USE 1 SPRAY IN EACH NOSTRIL TWO TIMES DAILY  . furosemide (LASIX) 20 MG tablet Take 1 tablet (20 mg total) by mouth daily as needed (SWELLING).  Marland Kitchen ketoconazole (NIZORAL) 2 % cream Apply topically daily as needed for irritation.  Marland Kitchen levothyroxine (SYNTHROID, LEVOTHROID) 112 MCG tablet Take 1 tablet (112 mcg total) by mouth daily before breakfast.  . meloxicam (MOBIC) 15 MG tablet Take 1 tablet (15 mg total) by mouth daily.  . metoprolol succinate (TOPROL-XL) 50 MG 24 hr tablet TAKE 1 TABLET BY MOUTH  EVERY DAY WITH OR  IMMEDIATELY FOLLOWING A  MEAL  . traMADol (ULTRAM) 50 MG tablet TAKE 1 TABLET BY MOUTH 3  TIMES DAILY AS NEEDED   No facility-administered encounter medications on file as of 11/02/2017.    Allergies  Allergen Reactions  . Demerol Nausea Only  . Meperidine Hcl Other (See Comments)    Feels loopy  . Oxycodone-Aspirin Other (See Comments)    Feels loopy  . Percocet [Oxycodone-Acetaminophen] Other (See Comments)    Feels loopy      Review of Systems:  Constitutional: No recent illness, no fever/chills  HEENT: No  headache, no vision change  Cardiac: No  chest pain, No  pressure, No palpitations  Respiratory:  No  shortness of breath.\  Musculoskeletal: No new myalgia/arthralgia  Skin: +Rash  Neurologic: No  weakness, No  Dizziness   Exam:  BP (!) 142/55 (BP  Location: Left Arm, Patient Position: Sitting, Cuff Size: Large)   Pulse 62   Temp 97.9 F (36.6 C) (Oral)   Wt 201 lb 3.2 oz (91.3 kg)   BMI 36.80 kg/m   Constitutional: VS see above. General Appearance: alert, well-developed, well-nourished, NAD  Eyes: Normal lids and conjunctive, non-icteric sclera  Ears, Nose, Mouth, Throat: MMM, Normal external inspection  ears/nares/mouth/lips/gums.  Neck: No masses, trachea midline.   Respiratory: Normal respiratory effort.   Musculoskeletal: Gait normal. Symmetric and independent movement of all extremities  Neurological: Normal balance/coordination. No tremor.  Skin: warm, dry, intact. See photo of scalp. Lesion is rough.   Psychiatric: Normal judgment/insight. Normal mood and affect. Oriented x3.       Visit summary with medication list and pertinent instructions was printed for patient to review, advised to alert [place] if any changes needed. All questions at time of visit were answered - patient instructed to contact office with any additional concerns. ER/RTC precautions were reviewed with the patient and understanding verbalized.   Follow-up plan: Return for routine care as directed by Dr Madilyn Fireman .  Note: Total time spent 15 minutes, greater than 50% of the visit was spent face-to-face counseling and coordinating care for the following: The encounter diagnosis was Lesion of skin of scalp..  Please note: voice recognition software was used to produce this document, and typos may escape review. Please contact Dr. Sheppard Coil for any needed clarifications.

## 2017-11-02 NOTE — Patient Instructions (Signed)
Will send to dermatology to determine if this is something benign we can freeze off, or if something more suspicious that will require removal either with complete skin excision or shaving the lesion off.

## 2017-11-03 ENCOUNTER — Other Ambulatory Visit: Payer: Self-pay | Admitting: Family Medicine

## 2017-11-03 NOTE — Telephone Encounter (Signed)
Routed to pcp for signature.Martha Perkins, Martha Perkins

## 2017-11-05 ENCOUNTER — Other Ambulatory Visit: Payer: Self-pay | Admitting: Family Medicine

## 2017-11-05 ENCOUNTER — Other Ambulatory Visit: Payer: Self-pay

## 2017-11-05 MED ORDER — LEVOTHYROXINE SODIUM 112 MCG PO TABS: 112.0000 ug | ORAL_TABLET | Freq: Every day | ORAL | 3 refills | Status: DC

## 2017-11-05 NOTE — Telephone Encounter (Signed)
Martha Perkins left a message asking for a refill on levothyroxine 112 mcg once daily. Never prescribed by Dr Madilyn Fireman.

## 2017-11-05 NOTE — Telephone Encounter (Signed)
Ok, new rx sent to [facility].

## 2017-12-03 ENCOUNTER — Other Ambulatory Visit: Payer: Self-pay

## 2017-12-03 DIAGNOSIS — C4491 Basal cell carcinoma of skin, unspecified: Secondary | ICD-10-CM

## 2017-12-03 DIAGNOSIS — D2262 Melanocytic nevi of left upper limb, including shoulder: Secondary | ICD-10-CM | POA: Diagnosis not present

## 2017-12-03 DIAGNOSIS — D485 Neoplasm of uncertain behavior of skin: Secondary | ICD-10-CM | POA: Diagnosis not present

## 2017-12-03 DIAGNOSIS — D229 Melanocytic nevi, unspecified: Secondary | ICD-10-CM

## 2017-12-03 DIAGNOSIS — D2272 Melanocytic nevi of left lower limb, including hip: Secondary | ICD-10-CM | POA: Diagnosis not present

## 2017-12-03 DIAGNOSIS — L820 Inflamed seborrheic keratosis: Secondary | ICD-10-CM | POA: Diagnosis not present

## 2017-12-03 DIAGNOSIS — C44519 Basal cell carcinoma of skin of other part of trunk: Secondary | ICD-10-CM | POA: Diagnosis not present

## 2017-12-03 HISTORY — DX: Melanocytic nevi, unspecified: D22.9

## 2017-12-03 HISTORY — DX: Basal cell carcinoma of skin, unspecified: C44.91

## 2018-01-14 DIAGNOSIS — C44519 Basal cell carcinoma of skin of other part of trunk: Secondary | ICD-10-CM | POA: Diagnosis not present

## 2018-01-19 ENCOUNTER — Ambulatory Visit: Payer: Medicare Other | Admitting: Family Medicine

## 2018-01-29 ENCOUNTER — Encounter: Payer: Self-pay | Admitting: Family Medicine

## 2018-01-29 ENCOUNTER — Ambulatory Visit (INDEPENDENT_AMBULATORY_CARE_PROVIDER_SITE_OTHER): Payer: Medicare Other | Admitting: Family Medicine

## 2018-01-29 VITALS — BP 134/64 | HR 61 | Ht 61.42 in | Wt 200.0 lb

## 2018-01-29 DIAGNOSIS — Z23 Encounter for immunization: Secondary | ICD-10-CM | POA: Diagnosis not present

## 2018-01-29 DIAGNOSIS — E890 Postprocedural hypothyroidism: Secondary | ICD-10-CM | POA: Diagnosis not present

## 2018-01-29 DIAGNOSIS — T8189XA Other complications of procedures, not elsewhere classified, initial encounter: Secondary | ICD-10-CM | POA: Diagnosis not present

## 2018-01-29 DIAGNOSIS — E8881 Metabolic syndrome: Secondary | ICD-10-CM | POA: Diagnosis not present

## 2018-01-29 DIAGNOSIS — I10 Essential (primary) hypertension: Secondary | ICD-10-CM | POA: Diagnosis not present

## 2018-01-29 LAB — LIPID PANEL
CHOL/HDL RATIO: 4.7 (calc) (ref <5.0)
Cholesterol: 205 mg/dL — ABNORMAL HIGH (ref <200)
HDL: 44 mg/dL — ABNORMAL LOW (ref >50)
LDL CHOLESTEROL (CALC): 123 mg/dL — AB
NON-HDL CHOLESTEROL (CALC): 161 mg/dL — AB (ref <130)
Triglycerides: 248 mg/dL — ABNORMAL HIGH (ref <150)

## 2018-01-29 LAB — COMPLETE METABOLIC PANEL WITH GFR
AG RATIO: 2 (calc) (ref 1.0–2.5)
ALT: 23 U/L (ref 6–29)
AST: 19 U/L (ref 10–35)
Albumin: 4.5 g/dL (ref 3.6–5.1)
Alkaline phosphatase (APISO): 61 U/L (ref 33–130)
BUN: 17 mg/dL (ref 7–25)
CO2: 28 mmol/L (ref 20–32)
Calcium: 9.6 mg/dL (ref 8.6–10.4)
Chloride: 106 mmol/L (ref 98–110)
Creat: 0.77 mg/dL (ref 0.60–0.93)
GFR, Est African American: 91 mL/min/{1.73_m2} (ref >=60)
GFR, Est Non African American: 78 mL/min/{1.73_m2} (ref >=60)
GLOBULIN: 2.2 g/dL (ref 1.9–3.7)
Glucose, Bld: 85 mg/dL (ref 65–99)
POTASSIUM: 4.3 mmol/L (ref 3.5–5.3)
SODIUM: 141 mmol/L (ref 135–146)
Total Bilirubin: 0.9 mg/dL (ref 0.2–1.2)
Total Protein: 6.7 g/dL (ref 6.1–8.1)

## 2018-01-29 LAB — POCT GLYCOSYLATED HEMOGLOBIN (HGB A1C): Hemoglobin A1C: 5.4 % (ref 4.0–5.6)

## 2018-01-29 LAB — TSH: TSH: 10.34 mIU/L — ABNORMAL HIGH (ref 0.40–4.50)

## 2018-01-29 MED ORDER — SULFAMETHOXAZOLE-TRIMETHOPRIM 800-160 MG PO TABS: 1.0000 | ORAL_TABLET | Freq: Two times a day (BID) | ORAL | 0 refills | Status: DC

## 2018-01-29 NOTE — Progress Notes (Signed)
Subjective:    CC: HTN   HPI:  Hypertension- Pt denies chest pain, SOB, dizziness, or heart palpitations.  Taking meds as directed w/o problems.  Denies medication side effects.    Impaired fasting glucose-no increased thirst or urination. No symptoms consistent with hypoglycemia.  Hypothyroidism - Taking medication regularly in the AM away from food and vitamins, etc. No recent change to skin, hair, or energy levels.  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  She has been back for curettage of the lesion.Using mupiricin on it for almost 2 weeks.  She says it still getting a little bit of yellow serous drainage but it has started bleeding a little bit this week which it was not doing previously.  But being on her abdomen she says she has accidentally bumped it a couple of times.  She is been just placing some loose fitting gauze over it, to absorb the drainage.  Fatty liver-due to recheck liver enzymes.  Past medical history, Surgical history, Family history not pertinant except as noted below, Social history, Allergies, and medications have been entered into the medical record, reviewed, and corrections made.   Review of Systems: No fevers, chills, night sweats, weight loss, chest pain, or shortness of breath.   Objective:    General: Well Developed, well nourished, and in no acute distress.  Neuro: Alert and oriented x3, extra-ocular muscles intact, sensation grossly intact.  HEENT: Normocephalic, atraumatic  Skin: Warm and dry, no rashes. Biopsy wound-measuring a proximally 1.2 x 1.0 cm in size.  It actually really looks like a very clean wound there is definitely some serous drainage on the bandage no active bleeding right now.  There is a little bit of erythema right around the edge.  W Cardiac: Regular rate and rhythm, no murmurs rubs or gallops, no lower extremity edema.  Respiratory: Clear to auscultation bilaterally. Not using accessory muscles, speaking in  full sentences.   Impression and Recommendations:    HTN - BP borderline today.    IFG - Well controlled. Continue current regimen. Follow up in  6-12 months.    Fatty liver - due to recheck the liver.    Biopsy wound-measuring a proximally 1.2 x 1.0 cm in size.  It actually really looks like a very clean wound there is definitely some serous drainage on the bandage no active bleeding right now.  There is a little bit of erythema right around the edge.  We discussed discontinuing the mupirocin and switching to just plain Vaseline to go ahead and place her on Bactrim over the weekend to see if it improves.  I did encourage her to call her dermatologist just to give them an FYI and if not healing that she probably needs to get back in with them.  Hypothyroidism - due to recheck TSH.

## 2018-02-01 ENCOUNTER — Other Ambulatory Visit: Payer: Self-pay | Admitting: *Deleted

## 2018-02-01 MED ORDER — TRAMADOL HCL 50 MG PO TABS: 50.0000 mg | ORAL_TABLET | Freq: Three times a day (TID) | ORAL | 1 refills | Status: DC | PRN

## 2018-02-01 NOTE — Telephone Encounter (Signed)
Routing to pcp for signature.[name], CMA  

## 2018-02-02 MED ORDER — TRAMADOL HCL 50 MG PO TABS: 50.0000 mg | ORAL_TABLET | Freq: Three times a day (TID) | ORAL | 1 refills | Status: DC | PRN

## 2018-03-15 ENCOUNTER — Encounter: Payer: Self-pay | Admitting: Family Medicine

## 2018-03-15 ENCOUNTER — Ambulatory Visit (INDEPENDENT_AMBULATORY_CARE_PROVIDER_SITE_OTHER): Payer: Medicare Other | Admitting: Family Medicine

## 2018-03-15 VITALS — BP 138/59 | HR 69 | Ht 61.0 in | Wt 200.0 lb

## 2018-03-15 DIAGNOSIS — J329 Chronic sinusitis, unspecified: Secondary | ICD-10-CM | POA: Diagnosis not present

## 2018-03-15 DIAGNOSIS — J40 Bronchitis, not specified as acute or chronic: Secondary | ICD-10-CM

## 2018-03-15 MED ORDER — AMOXICILLIN-POT CLAVULANATE 875-125 MG PO TABS: 1.0000 | ORAL_TABLET | Freq: Two times a day (BID) | ORAL | 0 refills | Status: DC

## 2018-03-15 MED ORDER — HYDROCODONE-HOMATROPINE 5-1.5 MG/5ML PO SYRP: 5.0000 mL | ORAL_SOLUTION | Freq: Every evening | ORAL | 0 refills | Status: DC | PRN

## 2018-03-15 MED ORDER — PREDNISONE 20 MG PO TABS: 40.0000 mg | ORAL_TABLET | Freq: Every day | ORAL | 0 refills | Status: DC

## 2018-03-15 NOTE — Progress Notes (Signed)
Acute Office Visit sick x 2 weeks.    Subjective:    Patient ID: Martha Perkins, female    DOB: 1946/01/19, 73 y.o.   MRN: [id number]  Chief Complaint  Patient presents with  . Cough    pt reports that her sxs started ~ 2 wks ago w/sinus drainage she has had to use her inhaler more and has been using sudafed and and nasal spray. she has been coughing/blowing out green/thick white mucous denies f/s/c  . Wheezing    HPI Patient is in today for cough and wheezing x 2 weeks before Christmas.   She says that initially started with sinus congestion with green nasal congestion and drainage.  No fevers chills or sweats.  Gradually developed into a sore throat and then let yesterday started coughing and feeling short of breath.  She said she was using her albuterol every 2-3 hours last night.  Is like she is getting worse.no GI sxs.    Past Medical History:  Diagnosis Date  . Depression   . Hyperlipidemia   . Hypertension   . Hyperthyroidism 08/09/2015  . Kidney stone   . Paroxysmal atrial fibrillation ([place]) 08/09/2015  . Thyroid disease     Past Surgical History:  Procedure Laterality Date  . CARDIOVERSION N/A 08/05/2016   Procedure: CARDIOVERSION;  Surgeon: Skeet Latch, MD;  Location: [facility];  Service: Cardiovascular;  Laterality: N/A;  . CATARACT EXTRACTION Left 09/2012   Dr. Bunnie Philips  . LITHOTRIPSY    . REPLACEMENT TOTAL KNEE BILATERAL  2011  . THYROIDECTOMY  1971  . TUBAL LIGATION  1979    Family History  Problem Relation Age of Onset  . Coronary artery disease Mother 75       deceased  . Depression Mother   . Hyperlipidemia Mother   . Diabetes Paternal Grandmother   . Thyroid disease Sister     Social History   Socioeconomic History  . Marital status: Married    Spouse name: Not on file  . Number of children: Not on file  . Years of education: Not on file  . Highest education level: Not on file  Occupational History  . Occupation: Retired Therapist, sports.   Social  Needs  . Financial resource strain: Not on file  . Food insecurity:    Worry: Not on file    Inability: Not on file  . Transportation needs:    Medical: Not on file    Non-medical: Not on file  Tobacco Use  . Smoking status: Never Smoker  . Smokeless tobacco: Never Used  Substance and Sexual Activity  . Alcohol use: No  . Drug use: No  . Sexual activity: Not on file  Lifestyle  . Physical activity:    Days per week: Not on file    Minutes per session: Not on file  . Stress: Not on file  Relationships  . Social connections:    Talks on phone: Not on file    Gets together: Not on file    Attends religious service: Not on file    Active member of club or organization: Not on file    Attends meetings of clubs or organizations: Not on file    Relationship status: Not on file  . Intimate partner violence:    Fear of current or ex partner: Not on file    Emotionally abused: Not on file    Physically abused: Not on file    Forced sexual activity: Not on file  Other Topics Concern  . Not on file  Social History Narrative  . Not on file    Outpatient Medications Prior to Visit  Medication Sig Dispense Refill  . acetaminophen (TYLENOL) 650 MG CR tablet Take 1 tablet (650 mg total) by mouth every 8 (eight) hours. 90 tablet 3  . albuterol (PROAIR HFA) 108 (90 Base) MCG/ACT inhaler Inhale 2 puffs into the lungs every 6 (six) hours as needed for wheezing or shortness of breath. 1 Inhaler 2  . atorvastatin (LIPITOR) 40 MG tablet TAKE 1 TABLET BY MOUTH  DAILY 90 tablet 3  . diltiazem (CARDIZEM CD) 240 MG 24 hr capsule TAKE 1 CAPSULE BY MOUTH  DAILY 90 capsule 3  . esomeprazole (NEXIUM) 40 MG capsule Take 1 capsule (40 mg total) by mouth daily. 90 capsule 0  . fluticasone (FLONASE) 50 MCG/ACT nasal spray USE 1 SPRAY IN EACH NOSTRIL TWO TIMES DAILY 48 g 3  . furosemide (LASIX) 20 MG tablet Take 1 tablet (20 mg total) by mouth daily as needed (SWELLING). 90 tablet 1  . ketoconazole  (NIZORAL) 2 % cream Apply topically daily as needed for irritation. 60 g 3  . levothyroxine (SYNTHROID, LEVOTHROID) 112 MCG tablet Take 1 tablet (112 mcg total) by mouth daily before breakfast. 90 tablet 3  . meloxicam (MOBIC) 15 MG tablet Take 1 tablet (15 mg total) by mouth daily. 90 tablet 2  . metoprolol succinate (TOPROL-XL) 50 MG 24 hr tablet TAKE 1 TABLET BY MOUTH  EVERY DAY WITH OR  IMMEDIATELY FOLLOWING A  MEAL 90 tablet 1  . traMADol (ULTRAM) 50 MG tablet Take 1 tablet (50 mg total) by mouth 3 (three) times daily as needed. 90 tablet 1  . clotrimazole-betamethasone (LOTRISONE) cream Apply 1 application topically daily. 45 g 0  . sulfamethoxazole-trimethoprim (BACTRIM DS,SEPTRA DS) 800-160 MG tablet Take 1 tablet by mouth 2 (two) times daily. 20 tablet 0   No facility-administered medications prior to visit.     Allergies  Allergen Reactions  . Demerol Nausea Only  . Meperidine Hcl Other (See Comments)    Feels loopy  . Oxycodone-Aspirin Other (See Comments)    Feels loopy  . Percocet [Oxycodone-Acetaminophen] Other (See Comments)    Feels loopy    ROS     Objective:    Physical Exam  Constitutional: She is oriented to person, place, and time. She appears well-developed and well-nourished.  HENT:  Head: Normocephalic and atraumatic.  Right Ear: External ear normal.  Left Ear: External ear normal.  Nose: Nose normal.  Mouth/Throat: Oropharynx is clear and moist.  TMs and canals are clear.   Eyes: Pupils are equal, round, and reactive to light. Conjunctivae and EOM are normal.  Neck: Neck supple. No thyromegaly present.  Cardiovascular: Normal rate, regular rhythm and normal heart sounds.  Pulmonary/Chest: Effort normal and breath sounds normal. She has no wheezes.  Lymphadenopathy:    She has no cervical adenopathy.  Neurological: She is alert and oriented to person, place, and time.  Skin: Skin is warm and dry.  Psychiatric: She has a normal mood and affect.     BP (!) 138/59   Pulse 69   Ht 5\' 1"  (1.549 m)   Wt 200 lb (90.7 kg)   SpO2 99%   BMI 37.79 kg/m  Wt Readings from Last 3 Encounters:  03/15/18 200 lb (90.7 kg)  01/29/18 200 lb (90.7 kg)  11/02/17 201 lb 3.2 oz (91.3 kg)    Health Maintenance Due  Topic Date Due  . MAMMOGRAM  02/25/2018    There are no preventive care reminders to display for this patient.   Lab Results  Component Value Date   TSH 10.34 (H) 01/29/2018   Lab Results  Component Value Date   WBC 5.8 12/08/2016   HGB 13.5 12/08/2016   HCT 38.7 12/08/2016   MCV 91.5 12/08/2016   PLT 203 12/08/2016   Lab Results  Component Value Date   NA 141 01/29/2018   K 4.3 01/29/2018   CO2 28 01/29/2018   GLUCOSE 85 01/29/2018   BUN 17 01/29/2018   CREATININE 0.77 01/29/2018   BILITOT 0.9 01/29/2018   ALKPHOS 119 08/25/2016   AST 19 01/29/2018   ALT 23 01/29/2018   PROT 6.7 01/29/2018   ALBUMIN 4.4 08/25/2016   CALCIUM 9.6 01/29/2018   Lab Results  Component Value Date   CHOL 205 (H) 01/29/2018   Lab Results  Component Value Date   HDL 44 (L) 01/29/2018   Lab Results  Component Value Date   LDLCALC 123 (H) 01/29/2018   Lab Results  Component Value Date   TRIG 248 (H) 01/29/2018   Lab Results  Component Value Date   CHOLHDL 4.7 01/29/2018   Lab Results  Component Value Date   HGBA1C 5.4 01/29/2018       Assessment & Plan:   Problem List Items Addressed This Visit    None    Visit Diagnoses    Sinobronchitis    -  Primary   Relevant Medications   amoxicillin-clavulanate (AUGMENTIN) 875-125 MG tablet   HYDROcodone-homatropine (HYCODAN) 5-1.5 MG/5ML syrup   predniSONE (DELTASONE) 20 MG tablet     Will treat with Augmentin.  Given cough syrup for bedtime as well.  Okay to fill the prednisone the next day or 2 if she still having to grab her inhaler frequently.  Meds ordered this encounter  Medications  . amoxicillin-clavulanate (AUGMENTIN) 875-125 MG tablet    Sig: Take 1  tablet by mouth 2 (two) times daily.    Dispense:  20 tablet    Refill:  0  . DISCONTD: HYDROcodone-homatropine (HYCODAN) 5-1.5 MG/5ML syrup    Sig: Take 5 mLs by mouth at bedtime as needed for cough.    Dispense:  120 mL    Refill:  0  . HYDROcodone-homatropine (HYCODAN) 5-1.5 MG/5ML syrup    Sig: Take 5 mLs by mouth at bedtime as needed for cough.    Dispense:  120 mL    Refill:  0  . predniSONE (DELTASONE) 20 MG tablet    Sig: Take 2 tablets (40 mg total) by mouth daily with breakfast.    Dispense:  10 tablet    Refill:  0     Beatrice Lecher, MD

## 2018-03-24 ENCOUNTER — Telehealth: Payer: Self-pay | Admitting: Family Medicine

## 2018-03-24 NOTE — Telephone Encounter (Signed)
If they are doing a cleaning then she should be fine on the Augmentin.

## 2018-03-24 NOTE — Telephone Encounter (Signed)
Pt took last dose this am, will route back to make sure nothing additional is needed.

## 2018-03-24 NOTE — Telephone Encounter (Signed)
Pt request Rx for antibiotic prior to dental appointment tomorrow. Routing. Local [facility] on file correct.

## 2018-03-25 NOTE — Telephone Encounter (Signed)
Pt advised.

## 2018-03-25 NOTE — Telephone Encounter (Signed)
Should be fine if just cleaning.

## 2018-05-10 ENCOUNTER — Other Ambulatory Visit: Payer: Self-pay | Admitting: Family Medicine

## 2018-05-14 ENCOUNTER — Other Ambulatory Visit: Payer: Self-pay | Admitting: Physician Assistant

## 2018-05-14 DIAGNOSIS — J4521 Mild intermittent asthma with (acute) exacerbation: Secondary | ICD-10-CM

## 2018-05-14 MED ORDER — TRAMADOL HCL 50 MG PO TABS: 50.0000 mg | ORAL_TABLET | Freq: Three times a day (TID) | ORAL | 1 refills | Status: DC | PRN

## 2018-05-18 ENCOUNTER — Ambulatory Visit (INDEPENDENT_AMBULATORY_CARE_PROVIDER_SITE_OTHER): Payer: Medicare Other | Admitting: Family Medicine

## 2018-05-18 ENCOUNTER — Other Ambulatory Visit: Payer: Self-pay | Admitting: Cardiovascular Disease

## 2018-05-18 VITALS — BP 155/58 | HR 57

## 2018-05-18 DIAGNOSIS — Z111 Encounter for screening for respiratory tuberculosis: Secondary | ICD-10-CM | POA: Diagnosis not present

## 2018-05-18 NOTE — Progress Notes (Signed)
Agree with documentation as above.   Latrail Pounders, MD  

## 2018-05-18 NOTE — Progress Notes (Signed)
Pt came into clinic today for PPD placement. 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 Pt has had a PPD test before, never tested positive. Tolerated placement on left forearm well, no immediate complications. PPD read appt scheduled.   Pt's BP was elevated today but she was asymptomatic, per PCP we will reevaluate at PPD read in 2 days.

## 2018-05-20 ENCOUNTER — Other Ambulatory Visit: Payer: Self-pay

## 2018-05-20 ENCOUNTER — Ambulatory Visit (INDEPENDENT_AMBULATORY_CARE_PROVIDER_SITE_OTHER): Payer: Medicare Other | Admitting: Family Medicine

## 2018-05-20 VITALS — BP 127/59 | HR 68 | Temp 98.4°F | Wt 207.0 lb

## 2018-05-20 DIAGNOSIS — Z111 Encounter for screening for respiratory tuberculosis: Secondary | ICD-10-CM

## 2018-05-20 LAB — TB SKIN TEST
Induration: 0 mm
TB Skin Test: NEGATIVE

## 2018-05-20 NOTE — Progress Notes (Signed)
Pt in today for PPD reading. Reading was negative . 0 mm induration. Note given to patient for employement. Patients BP today was 127/59. Will forward note to provider.

## 2018-05-20 NOTE — Progress Notes (Signed)
Agree with documentation as above.   Catherine Metheney, MD  

## 2018-05-21 ENCOUNTER — Other Ambulatory Visit: Payer: Self-pay

## 2018-05-21 DIAGNOSIS — D2262 Melanocytic nevi of left upper limb, including shoulder: Secondary | ICD-10-CM | POA: Diagnosis not present

## 2018-05-21 DIAGNOSIS — D2362 Other benign neoplasm of skin of left upper limb, including shoulder: Secondary | ICD-10-CM | POA: Diagnosis not present

## 2018-06-10 ENCOUNTER — Other Ambulatory Visit: Payer: Self-pay | Admitting: *Deleted

## 2018-06-10 MED ORDER — DILTIAZEM HCL ER COATED BEADS 240 MG PO CP24: 240.0000 mg | ORAL_CAPSULE | Freq: Every day | ORAL | 1 refills | Status: DC

## 2018-07-16 ENCOUNTER — Other Ambulatory Visit: Payer: Self-pay

## 2018-07-16 MED ORDER — TRAMADOL HCL 50 MG PO TABS: 50.0000 mg | ORAL_TABLET | Freq: Three times a day (TID) | ORAL | 0 refills | Status: DC | PRN

## 2018-07-16 NOTE — Telephone Encounter (Signed)
Refill for 90 day supply of Tramadol. Please check amount for approval.

## 2018-07-20 ENCOUNTER — Other Ambulatory Visit: Payer: Self-pay | Admitting: *Deleted

## 2018-07-20 MED ORDER — KETOCONAZOLE 2 % EX CREA: TOPICAL_CREAM | Freq: Every day | CUTANEOUS | 3 refills | Status: DC | PRN

## 2018-08-03 ENCOUNTER — Ambulatory Visit (INDEPENDENT_AMBULATORY_CARE_PROVIDER_SITE_OTHER): Payer: Medicare Other | Admitting: Family Medicine

## 2018-08-03 ENCOUNTER — Encounter: Payer: Self-pay | Admitting: Family Medicine

## 2018-08-03 VITALS — BP 163/82 | HR 68 | Temp 97.9°F | Ht 61.0 in | Wt 205.0 lb

## 2018-08-03 DIAGNOSIS — I10 Essential (primary) hypertension: Secondary | ICD-10-CM

## 2018-08-03 DIAGNOSIS — E8881 Metabolic syndrome: Secondary | ICD-10-CM | POA: Diagnosis not present

## 2018-08-03 DIAGNOSIS — M7989 Other specified soft tissue disorders: Secondary | ICD-10-CM | POA: Diagnosis not present

## 2018-08-03 DIAGNOSIS — E890 Postprocedural hypothyroidism: Secondary | ICD-10-CM | POA: Diagnosis not present

## 2018-08-03 MED ORDER — FUROSEMIDE 20 MG PO TABS: 20.0000 mg | ORAL_TABLET | Freq: Every day | ORAL | 1 refills | Status: DC | PRN

## 2018-08-03 NOTE — Progress Notes (Signed)
Virtual Visit via Video Note  I connected with Martha Perkins on 08/03/18 at 10:30 AM EDT by a video enabled telemedicine application and verified that I am speaking with the correct person using two identifiers.   I discussed the limitations of evaluation and management by telemedicine and the availability of in person appointments. The patient expressed understanding and agreed to proceed.  Pt was at home and I was in my office for the virtual visit.     Subjective:    CC:   HPI: Hypertension- Pt denies chest pain, SOB, dizziness, or heart palpitations.  Taking meds as directed w/o problems.  Denies medication side effects.    Impaired fasting glucose-no increased thirst or urination. No symptoms consistent with hypoglycemia.  Hypothyroidism - Taking medication regularly in the AM away from food and vitamins, etc. No recent change to skin, hair, or energy levels.  Her last level 6 months ago was elevated at 10.  She had just refilled a 90-day supply and so did not want to reorder extra medication so I had her take an extra half of a tab 1 day a week.  We are due to recheck that level.  Pt reports that she has had some leg ankle swelling however the prescription for her lasix (MAIL ORDER) has ran out and she was unsure if Dr. Madilyn Fireman wanted her to restart that or if she wants her to do the HCTZ instead.   Past medical history, Surgical history, Family history not pertinant except as noted below, Social history, Allergies, and medications have been entered into the medical record, reviewed, and corrections made.   Review of Systems: No fevers, chills, night sweats, weight loss, chest pain, or shortness of breath.   Objective:    General: Speaking clearly in complete sentences without any shortness of breath.  Alert and oriented x3.  Normal judgment. No apparent acute distress. Well groomed.    Impression and Recommendations:    HTN - BP very high today.  Encourage some exercise.  We  will start the furosemide as needed as that may help as well encouraged her to follow her blood pressure carefully at home.  It looked great when she was here 2 months ago it was 127/59 some hesitant to make any significant changes to her blood pressure regimen today.  IFG - due for A1C.  No major changes. WEight is stable.   Hypothyroidism - due to recheck thyroid levels after last adjustment.  She is taking an extra half of a tab 1 day a week.  Lower extremity edema-likely from venous stasis.  It is not new but she has been out of her medication for about a year and just feels like it is been getting worse lately.  She has not been wearing her compression stockings.  We discussed restarting furosemide and taking it as needed and just keeping track of how often she is using it.  If she finds she is using it more than twice a week then we may need to recheck potassium and renal function and consider starting potassium supplementation.    I discussed the assessment and treatment plan with the patient. The patient was provided an opportunity to ask questions and all were answered. The patient agreed with the plan and demonstrated an understanding of the instructions.   The patient was advised to call back or seek an in-person evaluation if the symptoms worsen or if the condition fails to improve as anticipated.   Beatrice Lecher, MD

## 2018-08-03 NOTE — Progress Notes (Signed)
Pt reports that she has had some leg ankle swelling however the prescription for her lasix (MAIL ORDER) has ran out and she was unsure if Dr. Madilyn Fireman wanted her to restart that or if she wants her to do the HCTZ instead.Elouise Munroe, [place]

## 2018-08-23 ENCOUNTER — Telehealth: Payer: Self-pay

## 2018-08-23 DIAGNOSIS — M7989 Other specified soft tissue disorders: Secondary | ICD-10-CM

## 2018-08-23 MED ORDER — POTASSIUM CHLORIDE CRYS ER 10 MEQ PO TBCR: 10.0000 meq | EXTENDED_RELEASE_TABLET | Freq: Every day | ORAL | 1 refills | Status: DC | PRN

## 2018-08-23 NOTE — Telephone Encounter (Signed)
Martha Perkins states she was advised to call if she had to increase furosemide. She is now taking 1 tablet 4 days a week due to the swelling in her feet. She is concerned about the losing potassium. Please advise.

## 2018-08-23 NOTE — Telephone Encounter (Signed)
Called pt and lvm advising her of recommendations. Lab ordered and faxed.Martha Perkins, Lahoma Crocker, CMA

## 2018-08-23 NOTE — Telephone Encounter (Signed)
Yes, we need to start potassium you will take it on the days you take the furosemide only.  Then we will need to recheck your potassium level with a BMP in about 1 to 2 weeks.

## 2018-08-27 ENCOUNTER — Ambulatory Visit (INDEPENDENT_AMBULATORY_CARE_PROVIDER_SITE_OTHER): Payer: Medicare Other | Admitting: Physician Assistant

## 2018-08-27 VITALS — BP 145/78 | HR 67 | Temp 97.9°F | Ht 61.0 in | Wt 209.0 lb

## 2018-08-27 DIAGNOSIS — R300 Dysuria: Secondary | ICD-10-CM | POA: Diagnosis not present

## 2018-08-27 DIAGNOSIS — B961 Klebsiella pneumoniae [K. pneumoniae] as the cause of diseases classified elsewhere: Secondary | ICD-10-CM | POA: Diagnosis not present

## 2018-08-27 DIAGNOSIS — N390 Urinary tract infection, site not specified: Secondary | ICD-10-CM

## 2018-08-27 DIAGNOSIS — R82998 Other abnormal findings in urine: Secondary | ICD-10-CM

## 2018-08-27 LAB — POCT URINALYSIS DIPSTICK
Bilirubin, UA: NEGATIVE
Glucose, UA: NEGATIVE
Ketones, UA: NEGATIVE
Nitrite, UA: NEGATIVE
Protein, UA: NEGATIVE
Spec Grav, UA: 1.01 (ref 1.010–1.025)
Urobilinogen, UA: 0.2 E.U./dL
pH, UA: 7 (ref 5.0–8.0)

## 2018-08-27 MED ORDER — CEFUROXIME AXETIL 250 MG PO TABS: 250.0000 mg | ORAL_TABLET | Freq: Two times a day (BID) | ORAL | 0 refills | Status: DC

## 2018-08-27 NOTE — Progress Notes (Signed)
Pt complains of polyuria and dysuria. Started this morning. Denied blood in urine nor fevers.

## 2018-08-27 NOTE — Progress Notes (Signed)
HPI:                                                                Martha Perkins is a 73 y.o. female who presents to [facility] today for dysuria  New onset burning with urination and frequency beginning today.   Past Medical History:  Diagnosis Date  . Depression   . Hyperlipidemia   . Hypertension   . Hyperthyroidism 08/09/2015  . Kidney stone   . Paroxysmal atrial fibrillation ([place]) 08/09/2015  . Thyroid disease    Past Surgical History:  Procedure Laterality Date  . CARDIOVERSION N/A 08/05/2016   Procedure: CARDIOVERSION;  Surgeon: Skeet Latch, MD;  Location: [facility];  Service: Cardiovascular;  Laterality: N/A;  . CATARACT EXTRACTION Left 09/2012   Dr. Bunnie Philips  . LITHOTRIPSY    . REPLACEMENT TOTAL KNEE BILATERAL  2011  . THYROIDECTOMY  1971  . TUBAL LIGATION  1979   Social History   Tobacco Use  . Smoking status: Never Smoker  . Smokeless tobacco: Never Used  Substance Use Topics  . Alcohol use: No   family history includes Coronary artery disease (age of onset: 41) in her mother; Depression in her mother; Diabetes in her paternal grandmother; Hyperlipidemia in her mother; Thyroid disease in her sister.    ROS: negative except as noted in the HPI  Medications: Current Outpatient Medications  Medication Sig Dispense Refill  . acetaminophen (TYLENOL) 650 MG CR tablet Take 1 tablet (650 mg total) by mouth every 8 (eight) hours. 90 tablet 3  . albuterol (PROVENTIL HFA;VENTOLIN HFA) 108 (90 Base) MCG/ACT inhaler INHALE 2 PUFFS BY MOUTH EVERY 6 HOURS AS NEEDED FOR WHEEZING OR SHORTNESS OF BREATH 9 g 0  . atorvastatin (LIPITOR) 40 MG tablet TAKE 1 TABLET BY MOUTH  DAILY 90 tablet 3  . diltiazem (CARDIZEM CD) 240 MG 24 hr capsule Take 1 capsule (240 mg total) by mouth daily. 90 capsule 1  . esomeprazole (NEXIUM) 40 MG capsule Take 1 capsule (40 mg total) by mouth daily. 90 capsule 0  . fluticasone (FLONASE)  50 MCG/ACT nasal spray USE 1 SPRAY IN EACH NOSTRIL TWO TIMES DAILY 48 g 3  . furosemide (LASIX) 20 MG tablet Take 1 tablet (20 mg total) by mouth daily as needed (SWELLING). 90 tablet 1  . ketoconazole (NIZORAL) 2 % cream Apply topically daily as needed for irritation. 60 g 3  . levothyroxine (SYNTHROID, LEVOTHROID) 112 MCG tablet Take 1 tablet (112 mcg total) by mouth daily before breakfast. 90 tablet 3  . meloxicam (MOBIC) 15 MG tablet TAKE 1 TABLET BY MOUTH  DAILY 90 tablet 2  . metoprolol succinate (TOPROL-XL) 50 MG 24 hr tablet TAKE 1 TABLET BY MOUTH  EVERY DAY WITH OR  IMMEDIATELY FOLLOWING A  MEAL 90 tablet 1  . potassium chloride (K-DUR) 10 MEQ tablet Take 1 tablet (10 mEq total) by mouth daily as needed. 30 tablet 1  . traMADol (ULTRAM) 50 MG tablet Take 1 tablet (50 mg total) by mouth 3 (three) times daily as needed. 270 tablet 0  . cefUROXime (CEFTIN) 250 MG tablet Take 1 tablet (250 mg total) by mouth 2 (two) times daily with a meal for 7 days. 14 tablet 0  No current facility-administered medications for this visit.    Allergies  Allergen Reactions  . Demerol Nausea Only  . Meperidine Hcl Other (See Comments)    Feels loopy  . Oxycodone-Aspirin Other (See Comments)    Feels loopy  . Percocet [Oxycodone-Acetaminophen] Other (See Comments)    Feels loopy       Objective:  BP (!) 145/78   Pulse 67   Temp 97.9 F (36.6 C) (Oral)   Ht 5\' 1"  (1.549 m)   Wt 209 lb (94.8 kg)   SpO2 98%   BMI 39.49 kg/m     Results for orders placed or performed in visit on 08/27/18 (from the past 72 hour(s))  POCT Urinalysis Dipstick     Status: Abnormal   Collection Time: 08/27/18  2:37 PM  Result Value Ref Range   Color, UA Yellow    Clarity, UA Clear    Glucose, UA Negative Negative   Bilirubin, UA Negative    Ketones, UA Negative    Spec Grav, UA 1.010 1.010 - 1.025   Blood, UA Small    pH, UA 7.0 5.0 - 8.0   Protein, UA Negative Negative   Urobilinogen, UA 0.2 0.2 or 1.0  E.U./dL   Nitrite, UA Negative    Leukocytes, UA Large (3+) (A) Negative   Appearance     Odor     No results found.    Assessment and Plan: 73 y.o. female with   .Martha Perkins was seen today for urinary frequency.  Diagnoses and all orders for this visit:  Dysuria -     Cancel: Urinalysis -     Urine Culture -     POCT Urinalysis Dipstick -     cefUROXime (CEFTIN) 250 MG tablet; Take 1 tablet (250 mg total) by mouth 2 (two) times daily with a meal for 7 days.  Urine leukocytes increased -     cefUROXime (CEFTIN) 250 MG tablet; Take 1 tablet (250 mg total) by mouth 2 (two) times daily with a meal for 7 days.   History and vital signs obtained by CMA and reviewed by me POC UA personally reviewed by me and positive for small blood and large leuks Treating empirically for uncomplicated UTI with Ceftin Counseled patient to hold Lasix for 7 days    Patient education and anticipatory guidance given Patient agrees with treatment plan Follow-up as needed if symptoms worsen or fail to improve  Darlyne Russian PA-C

## 2018-08-29 LAB — URINE CULTURE
MICRO NUMBER:: 588529
SPECIMEN QUALITY:: ADEQUATE

## 2018-08-29 LAB — URINALYSIS

## 2018-08-30 MED ORDER — SULFAMETHOXAZOLE-TRIMETHOPRIM 800-160 MG PO TABS: 1.0000 | ORAL_TABLET | Freq: Two times a day (BID) | ORAL | 0 refills | Status: AC

## 2018-08-30 NOTE — Addendum Note (Signed)
Addended by: Darlyne Russian on: 08/30/2018 09:57 AM   Modules accepted: Orders

## 2018-09-01 ENCOUNTER — Other Ambulatory Visit: Payer: Self-pay | Admitting: *Deleted

## 2018-09-01 MED ORDER — ATORVASTATIN CALCIUM 40 MG PO TABS: 40.0000 mg | ORAL_TABLET | Freq: Every day | ORAL | 3 refills | Status: DC

## 2018-09-07 ENCOUNTER — Telehealth: Payer: Self-pay

## 2018-09-07 DIAGNOSIS — L609 Nail disorder, unspecified: Secondary | ICD-10-CM

## 2018-09-07 NOTE — Telephone Encounter (Signed)
Zoraida called and left a message asking for a recommendations of a Podiatrist. She is having an ongoing problem with her toe nails. Please advise.

## 2018-09-07 NOTE — Telephone Encounter (Signed)
Referral placed. Left message on patient's voicemail advising of recommendations.

## 2018-09-07 NOTE — Telephone Encounter (Signed)
Ok to refer to [facility] office

## 2018-09-08 ENCOUNTER — Telehealth: Payer: Self-pay | Admitting: *Deleted

## 2018-09-08 NOTE — Telephone Encounter (Signed)
Pt advised that her form was emailed .Martha Perkins, [place]

## 2018-09-15 ENCOUNTER — Ambulatory Visit (INDEPENDENT_AMBULATORY_CARE_PROVIDER_SITE_OTHER): Payer: Medicare Other | Admitting: Podiatry

## 2018-09-15 ENCOUNTER — Other Ambulatory Visit: Payer: Self-pay

## 2018-09-15 ENCOUNTER — Encounter: Payer: Self-pay | Admitting: Podiatry

## 2018-09-15 VITALS — Temp 97.4°F

## 2018-09-15 DIAGNOSIS — M79676 Pain in unspecified toe(s): Secondary | ICD-10-CM | POA: Diagnosis not present

## 2018-09-15 DIAGNOSIS — B351 Tinea unguium: Secondary | ICD-10-CM

## 2018-09-15 DIAGNOSIS — Z79899 Other long term (current) drug therapy: Secondary | ICD-10-CM | POA: Diagnosis not present

## 2018-09-15 MED ORDER — CLOTRIMAZOLE-BETAMETHASONE 1-0.05 % EX CREA: 1.0000 "application " | TOPICAL_CREAM | Freq: Two times a day (BID) | CUTANEOUS | 2 refills | Status: DC

## 2018-09-20 ENCOUNTER — Other Ambulatory Visit: Payer: Self-pay

## 2018-09-20 ENCOUNTER — Ambulatory Visit (INDEPENDENT_AMBULATORY_CARE_PROVIDER_SITE_OTHER): Payer: Medicare Other | Admitting: Family Medicine

## 2018-09-20 ENCOUNTER — Telehealth: Payer: Self-pay

## 2018-09-20 ENCOUNTER — Encounter: Payer: Self-pay | Admitting: Family Medicine

## 2018-09-20 VITALS — BP 132/72 | HR 57 | Ht 61.0 in | Wt 209.0 lb

## 2018-09-20 DIAGNOSIS — M79662 Pain in left lower leg: Secondary | ICD-10-CM

## 2018-09-20 DIAGNOSIS — I10 Essential (primary) hypertension: Secondary | ICD-10-CM | POA: Diagnosis not present

## 2018-09-20 DIAGNOSIS — R7309 Other abnormal glucose: Secondary | ICD-10-CM | POA: Diagnosis not present

## 2018-09-20 DIAGNOSIS — R0789 Other chest pain: Secondary | ICD-10-CM

## 2018-09-20 DIAGNOSIS — R7989 Other specified abnormal findings of blood chemistry: Secondary | ICD-10-CM | POA: Diagnosis not present

## 2018-09-20 DIAGNOSIS — E890 Postprocedural hypothyroidism: Secondary | ICD-10-CM

## 2018-09-20 DIAGNOSIS — Z79899 Other long term (current) drug therapy: Secondary | ICD-10-CM | POA: Diagnosis not present

## 2018-09-20 DIAGNOSIS — M7989 Other specified soft tissue disorders: Secondary | ICD-10-CM | POA: Diagnosis not present

## 2018-09-20 DIAGNOSIS — I200 Unstable angina: Secondary | ICD-10-CM | POA: Diagnosis not present

## 2018-09-20 LAB — HEPATIC FUNCTION PANEL
AG Ratio: 1.7 (calc) (ref 1.0–2.5)
ALT: 53 U/L — ABNORMAL HIGH (ref 6–29)
AST: 35 U/L (ref 10–35)
Albumin: 4.3 g/dL (ref 3.6–5.1)
Alkaline phosphatase (APISO): 66 U/L (ref 37–153)
Bilirubin, Direct: 0.1 mg/dL (ref 0.0–0.2)
Globulin: 2.6 g/dL (calc) (ref 1.9–3.7)
Indirect Bilirubin: 0.9 mg/dL (calc) (ref 0.2–1.2)
Total Bilirubin: 1 mg/dL (ref 0.2–1.2)
Total Protein: 6.9 g/dL (ref 6.1–8.1)

## 2018-09-20 LAB — BASIC METABOLIC PANEL WITH GFR
BUN: 16 mg/dL (ref 7–25)
CO2: 27 mmol/L (ref 20–32)
Calcium: 9.4 mg/dL (ref 8.6–10.4)
Chloride: 106 mmol/L (ref 98–110)
Creat: 0.72 mg/dL (ref 0.60–0.93)
GFR, Est African American: 98 mL/min/{1.73_m2} (ref >=60)
GFR, Est Non African American: 84 mL/min/{1.73_m2} (ref >=60)
Glucose, Bld: 105 mg/dL — ABNORMAL HIGH (ref 65–99)
Potassium: 4.2 mmol/L (ref 3.5–5.3)
Sodium: 140 mmol/L (ref 135–146)

## 2018-09-20 LAB — BRAIN NATRIURETIC PEPTIDE: Brain Natriuretic Peptide: 30 pg/mL (ref <100)

## 2018-09-20 NOTE — Assessment & Plan Note (Signed)
Swelling of both lower extremities-we discussed getting compression stockings she still has not had a chance to purchase those.  I explained that the diuretic is only getting help so much she is really got have to also use the compression stockings to get better control and is not going to cure it but it will help control it.  Continue to work on [employer] as well as weight loss.  We discussed that some weight loss could make a big difference in her chronic swelling.

## 2018-09-20 NOTE — Assessment & Plan Note (Signed)
Due to recheck thyroid level.  

## 2018-09-20 NOTE — Telephone Encounter (Signed)
Per request of Dr Madilyn Fireman, ordered a US venous left due to swelling, pain and elevated D-dimer. Order placed and left message on Malcolm's voicemail advising she will get a call from the imaging department to schedule.

## 2018-09-20 NOTE — Progress Notes (Signed)
   SUBJECTIVE Patient presents to office today complaining of elongated, thickened nails that cause pain while ambulating in shoes. She is unable to trim her own nails. Patient is here for further evaluation and treatment.  Past Medical History:  Diagnosis Date  . Depression   . Hyperlipidemia   . Hypertension   . Hyperthyroidism 08/09/2015  . Kidney stone   . Paroxysmal atrial fibrillation ([place]) 08/09/2015  . Thyroid disease     OBJECTIVE General Patient is awake, alert, and oriented x 3 and in no acute distress. Derm Skin is dry and supple bilateral. Negative open lesions or macerations. Remaining integument unremarkable. Nails are tender, long, thickened and dystrophic with subungual debris, consistent with onychomycosis, 1-5 bilateral. No signs of infection noted. Pruritus noted to the bilateral feet with hyperkeratosis. Vasc  DP and PT pedal pulses palpable bilaterally. Temperature gradient within normal limits.  Neuro Epicritic and protective threshold sensation grossly intact bilaterally.  Musculoskeletal Exam No symptomatic pedal deformities noted bilateral. Muscular strength within normal limits.  ASSESSMENT 1. Onychodystrophic nails 1-5 bilateral with hyperkeratosis of nails.  2. Onychomycosis of nail due to dermatophyte bilateral 3. Tinea pedis bilateral   PLAN OF CARE 1. Patient evaluated today.  2. Instructed to maintain good pedal hygiene and foot care.  3. Mechanical debridement of nails 1-5 bilaterally performed using a nail nipper. Filed with dremel without incident.  4. Prescription for lotrisone provided to patient.  5. Order for LFT placed today. If normal, our office will call in Lamisil 250 mg #90.  6. Return to clinic as needed.    Edrick Kins, DPM [facility]  Dr. Edrick Kins, [place]                                        [place], [place] [address]                Office [phone]  Fax [phone]

## 2018-09-20 NOTE — Telephone Encounter (Signed)
Patient called back. She has been scheduled for tomorrow. Per Dr Madilyn Fireman, advised patient to elevated her feet 10-15 minutes a couple times a day to help with the fluid/swelling.

## 2018-09-20 NOTE — Progress Notes (Signed)
Established Patient Office Visit  Subjective:  Patient ID: Martha Perkins, female    DOB: [birth year]  Age: 73 y.o. MRN: [id number]  CC:  Chief Complaint  Patient presents with  . Leg Pain    HPI Martha Perkins presents for follow-up lower extremity swelling.  Last time she had actually called in June and said that she was taking her Lasix about 4 days/week so decided to go ahead and add potassium to her regimen.  She continues to struggle with the chronic swelling even though she is taking her Lasix and potassium.  She says it is worse in the left leg compared to the right.  She is actually started to notice some seepage from the left leg.  She has not gotten the compression stockings yet.  He also complains of some left calf pain that started yesterday.  She said initially it just felt almost more like a cramp and she thought maybe her potassium was low but as the day went on it became more of a burning sensation.  She says it feels a little better today but it is not gone.  She denies any recent changes.  He did try applying some heat and took an over-the-counter pain reliever and that actually did seem to help.  Also complains that she had a little bit of an odd sensation in her chest yesterday as well.  Hypothyroidism - Taking medication regularly in the AM away from food and vitamins, etc. No recent change to skin, hair, or energy levels.  He is taking 1 extra whole tab a week.  She also followed with podiatry and they recommended that she start oral antifungal for onychomycosis.  She was concerned because of the potential liver harm especially with her history of fatty liver.   Past Medical History:  Diagnosis Date  . Depression   . Hyperlipidemia   . Hypertension   . Hyperthyroidism 08/09/2015  . Kidney stone   . Paroxysmal atrial fibrillation ([place]) 08/09/2015  . Thyroid disease     Past Surgical History:  Procedure Laterality Date  . CARDIOVERSION N/A 08/05/2016   Procedure:  CARDIOVERSION;  Surgeon: Skeet Latch, MD;  Location: [place];  Service: Cardiovascular;  Laterality: N/A;  . CATARACT EXTRACTION Left 09/2012   Dr. Bunnie Philips  . LITHOTRIPSY    . REPLACEMENT TOTAL KNEE BILATERAL  2011  . THYROIDECTOMY  1971  . TUBAL LIGATION  1979    Family History  Problem Relation Age of Onset  . Coronary artery disease Mother 63       deceased  . Depression Mother   . Hyperlipidemia Mother   . Diabetes Paternal Grandmother   . Thyroid disease Sister     Social History   Socioeconomic History  . Marital status: Married    Spouse name: Not on file  . Number of children: Not on file  . Years of education: Not on file  . Highest education level: Not on file  Occupational History  . Occupation: Retired Therapist, sports.   Social Needs  . Financial resource strain: Not on file  . Food insecurity    Worry: Not on file    Inability: Not on file  . Transportation needs    Medical: Not on file    Non-medical: Not on file  Tobacco Use  . Smoking status: Never Smoker  . Smokeless tobacco: Never Used  Substance and Sexual Activity  . Alcohol use: No  . Drug use: No  . Sexual activity: Not  on file  Lifestyle  . Physical activity    Days per week: Not on file    Minutes per session: Not on file  . Stress: Not on file  Relationships  . Social Herbalist on phone: Not on file    Gets together: Not on file    Attends religious service: Not on file    Active member of club or organization: Not on file    Attends meetings of clubs or organizations: Not on file    Relationship status: Not on file  . Intimate partner violence    Fear of current or ex partner: Not on file    Emotionally abused: Not on file    Physically abused: Not on file    Forced sexual activity: Not on file  Other Topics Concern  . Not on file  Social History Narrative  . Not on file    Outpatient Medications Prior to Visit  Medication Sig Dispense Refill  .  acetaminophen (TYLENOL) 650 MG CR tablet Take 1 tablet (650 mg total) by mouth every 8 (eight) hours. 90 tablet 3  . albuterol (PROVENTIL HFA;VENTOLIN HFA) 108 (90 Base) MCG/ACT inhaler INHALE 2 PUFFS BY MOUTH EVERY 6 HOURS AS NEEDED FOR WHEEZING OR SHORTNESS OF BREATH 9 g 0  . atorvastatin (LIPITOR) 40 MG tablet Take 1 tablet (40 mg total) by mouth daily. 90 tablet 3  . clotrimazole-betamethasone (LOTRISONE) cream Apply 1 application topically 2 (two) times daily. 45 g 2  . diltiazem (CARDIZEM CD) 240 MG 24 hr capsule Take 1 capsule (240 mg total) by mouth daily. 90 capsule 1  . esomeprazole (NEXIUM) 40 MG capsule Take 1 capsule (40 mg total) by mouth daily. 90 capsule 0  . fluticasone (FLONASE) 50 MCG/ACT nasal spray USE 1 SPRAY IN EACH NOSTRIL TWO TIMES DAILY 48 g 3  . furosemide (LASIX) 20 MG tablet Take 1 tablet (20 mg total) by mouth daily as needed (SWELLING). 90 tablet 1  . ketoconazole (NIZORAL) 2 % cream Apply topically daily as needed for irritation. 60 g 3  . levothyroxine (SYNTHROID, LEVOTHROID) 112 MCG tablet Take 1 tablet (112 mcg total) by mouth daily before breakfast. 90 tablet 3  . meloxicam (MOBIC) 15 MG tablet TAKE 1 TABLET BY MOUTH  DAILY 90 tablet 2  . metoprolol succinate (TOPROL-XL) 50 MG 24 hr tablet TAKE 1 TABLET BY MOUTH  EVERY DAY WITH OR  IMMEDIATELY FOLLOWING A  MEAL 90 tablet 1  . mupirocin ointment (BACTROBAN) 2 % APPLY OINTMENT TOPICALLY TO AFFECTED AREA TWICE DAILY AS DIRECTED    . potassium chloride (K-DUR) 10 MEQ tablet Take 1 tablet (10 mEq total) by mouth daily as needed. 30 tablet 1  . traMADol (ULTRAM) 50 MG tablet Take 1 tablet (50 mg total) by mouth 3 (three) times daily as needed. 270 tablet 0   No facility-administered medications prior to visit.     Allergies  Allergen Reactions  . Demerol Nausea Only  . Meperidine Hcl Other (See Comments)    Feels loopy  . Oxycodone-Aspirin Other (See Comments)    Feels loopy  . Percocet  [Oxycodone-Acetaminophen] Other (See Comments)    Feels loopy    ROS Review of Systems    Objective:    Physical Exam  Constitutional: She is oriented to person, place, and time. She appears well-developed and well-nourished.  HENT:  Head: Normocephalic and atraumatic.  Cardiovascular: Normal rate, regular rhythm and normal heart sounds.  Pulmonary/Chest: Effort normal  and breath sounds normal.  Musculoskeletal:     Comments: Trace pitting edema from the knees down bilaterally a little worse on the left compared to the right.  Mild tenderness of the left calf.  No significant erythema.  Leg is a little bit more swollen compared to her right.  Neurological: She is alert and oriented to person, place, and time.  Skin: Skin is warm and dry.  Psychiatric: She has a normal mood and affect. Her behavior is normal.    BP 132/72   Pulse (!) 57   Ht 5\' 1"  (1.549 m)   Wt 209 lb (94.8 kg)   SpO2 97%   BMI 39.49 kg/m  Wt Readings from Last 3 Encounters:  09/20/18 209 lb (94.8 kg)  08/27/18 209 lb (94.8 kg)  08/03/18 205 lb (93 kg)     Health Maintenance Due  Topic Date Due  . MAMMOGRAM  02/25/2018    There are no preventive care reminders to display for this patient.  Lab Results  Component Value Date   TSH 10.34 (H) 01/29/2018   Lab Results  Component Value Date   WBC 5.8 12/08/2016   HGB 13.5 12/08/2016   HCT 38.7 12/08/2016   MCV 91.5 12/08/2016   PLT 203 12/08/2016   Lab Results  Component Value Date   NA 141 01/29/2018   K 4.3 01/29/2018   CO2 28 01/29/2018   GLUCOSE 85 01/29/2018   BUN 17 01/29/2018   CREATININE 0.77 01/29/2018   BILITOT 0.9 01/29/2018   ALKPHOS 119 08/25/2016   AST 19 01/29/2018   ALT 23 01/29/2018   PROT 6.7 01/29/2018   ALBUMIN 4.4 08/25/2016   CALCIUM 9.6 01/29/2018   Lab Results  Component Value Date   CHOL 205 (H) 01/29/2018   Lab Results  Component Value Date   HDL 44 (L) 01/29/2018   Lab Results  Component Value Date    LDLCALC 123 (H) 01/29/2018   Lab Results  Component Value Date   TRIG 248 (H) 01/29/2018   Lab Results  Component Value Date   CHOLHDL 4.7 01/29/2018   Lab Results  Component Value Date   HGBA1C 5.4 01/29/2018      Assessment & Plan:   Problem List Items Addressed This Visit      Cardiovascular and Mediastinum   Hypertension   Relevant Orders   Ambulatory referral to Cardiology   BASIC METABOLIC PANEL WITH GFR     Endocrine   Hypothyroidism    Due to recheck thyroid level.        Other   Localized swelling of both lower extremities - Primary    Swelling of both lower extremities-we discussed getting compression stockings she still has not had a chance to purchase those.  I explained that the diuretic is only getting help so much she is really got have to also use the compression stockings to get better control and is not going to cure it but it will help control it.  Continue to work on [employer] as well as weight loss.  We discussed that some weight loss could make a big difference in her chronic swelling.      Relevant Orders   TSH + free T4 (Completed)   Hemoglobin A1c   D-dimer, quantitative (not at [facility]) (Completed)   B Nat Peptide (Completed)   BASIC METABOLIC PANEL WITH GFR    Other Visit Diagnoses    Pain of left calf  Relevant Orders   D-dimer, quantitative (not at [facility]) (Completed)   BASIC METABOLIC PANEL WITH GFR   US Venous Img Lower Unilateral Left   Atypical chest pain       Relevant Orders   Hemoglobin A1c   Ambulatory referral to Cardiology   BASIC METABOLIC PANEL WITH GFR   Positive D dimer       Relevant Orders   US Venous Img Lower Unilateral Left     Left calf pain-she is a little bit tender with calf squeeze.  She has a history of A. fib but she did not sound like she was in A. fib today.  She is not currently on any anticoagulation.  We will go ahead and get a stat d-dimer if it is elevated then will get Doppler for further  work-up.  And that she does have fatty liver I think as long as liver enzymes are being checked it is okay for her to take the antifungal medication, Lamisil.  No orders of the defined types were placed in this encounter.   Follow-up: Return in about 3 months (around 12/21/2018), or if symptoms worsen or fail to improve, for ifg,bp.    Beatrice Lecher, MD

## 2018-09-21 ENCOUNTER — Ambulatory Visit: Payer: Medicare Other

## 2018-09-21 DIAGNOSIS — M79662 Pain in left lower leg: Secondary | ICD-10-CM

## 2018-09-21 DIAGNOSIS — R7989 Other specified abnormal findings of blood chemistry: Secondary | ICD-10-CM

## 2018-09-21 LAB — D-DIMER, QUANTITATIVE: D-Dimer, Quant: 0.73 mcg/mL FEU — ABNORMAL HIGH (ref <0.50)

## 2018-09-21 LAB — TSH+FREE T4: TSH W/REFLEX TO FT4: 3.16 mIU/L (ref 0.40–4.50)

## 2018-09-21 LAB — HEMOGLOBIN A1C
Hgb A1c MFr Bld: 5.6 % of total Hgb (ref <5.7)
Mean Plasma Glucose: 114 (calc)
eAG (mmol/L): 6.3 (calc)

## 2018-10-01 ENCOUNTER — Other Ambulatory Visit: Payer: Self-pay | Admitting: Podiatry

## 2018-10-01 MED ORDER — TERBINAFINE HCL 250 MG PO TABS: 250.0000 mg | ORAL_TABLET | Freq: Every day | ORAL | 0 refills | Status: DC

## 2018-10-01 NOTE — Progress Notes (Signed)
Rx PRN onychomycosis

## 2018-10-20 ENCOUNTER — Telehealth: Payer: Self-pay

## 2018-10-20 DIAGNOSIS — Z1239 Encounter for other screening for malignant neoplasm of breast: Secondary | ICD-10-CM

## 2018-10-20 DIAGNOSIS — N644 Mastodynia: Secondary | ICD-10-CM

## 2018-10-20 DIAGNOSIS — N6452 Nipple discharge: Secondary | ICD-10-CM

## 2018-10-20 NOTE — Telephone Encounter (Signed)
Patient advised. She states she mentioned needing a mammogram during her last appointment.

## 2018-10-20 NOTE — Telephone Encounter (Signed)
Order signed.  I was not aware that she had contacted [place] about the issues and that she needed imaging.  Hopefully they can get this expedited for her.

## 2018-10-20 NOTE — Telephone Encounter (Signed)
Martha Perkins states she was waiting on an order for a Mammogram. She left a message stating she has drainage from her right breast and pain in both. Pended orders.

## 2018-10-26 ENCOUNTER — Other Ambulatory Visit: Payer: Self-pay | Admitting: *Deleted

## 2018-10-26 DIAGNOSIS — E890 Postprocedural hypothyroidism: Secondary | ICD-10-CM

## 2018-10-26 DIAGNOSIS — M5136 Other intervertebral disc degeneration, lumbar region: Secondary | ICD-10-CM

## 2018-10-26 DIAGNOSIS — I10 Essential (primary) hypertension: Secondary | ICD-10-CM

## 2018-10-26 MED ORDER — LEVOTHYROXINE SODIUM 112 MCG PO TABS: 112.0000 ug | ORAL_TABLET | Freq: Every day | ORAL | 3 refills | Status: DC

## 2018-10-26 MED ORDER — METOPROLOL SUCCINATE ER 50 MG PO TB24: ORAL_TABLET | ORAL | 2 refills | Status: DC

## 2018-10-26 MED ORDER — DILTIAZEM HCL ER COATED BEADS 240 MG PO CP24: 240.0000 mg | ORAL_CAPSULE | Freq: Every day | ORAL | 3 refills | Status: DC

## 2018-10-27 ENCOUNTER — Other Ambulatory Visit: Payer: Self-pay | Admitting: Family Medicine

## 2018-10-29 ENCOUNTER — Other Ambulatory Visit: Payer: Medicare Other

## 2018-11-08 ENCOUNTER — Ambulatory Visit
Admission: RE | Admit: 2018-11-08 | Discharge: 2018-11-08 | Disposition: A | Discharge status: Home / Self Care | Payer: Medicare Other | Source: Ambulatory Visit | Attending: Family Medicine | Admitting: Family Medicine

## 2018-11-08 ENCOUNTER — Ambulatory Visit: Payer: Medicare Other

## 2018-11-08 ENCOUNTER — Other Ambulatory Visit: Payer: Self-pay

## 2018-11-08 DIAGNOSIS — N6452 Nipple discharge: Secondary | ICD-10-CM

## 2018-11-08 DIAGNOSIS — R922 Inconclusive mammogram: Secondary | ICD-10-CM | POA: Diagnosis not present

## 2018-11-08 DIAGNOSIS — Z1239 Encounter for other screening for malignant neoplasm of breast: Secondary | ICD-10-CM

## 2018-11-08 DIAGNOSIS — N644 Mastodynia: Secondary | ICD-10-CM

## 2018-12-14 ENCOUNTER — Telehealth: Payer: Self-pay

## 2018-12-15 ENCOUNTER — Encounter: Payer: Self-pay | Admitting: Family Medicine

## 2018-12-15 ENCOUNTER — Ambulatory Visit (INDEPENDENT_AMBULATORY_CARE_PROVIDER_SITE_OTHER): Payer: Medicare Other | Admitting: Family Medicine

## 2018-12-15 ENCOUNTER — Other Ambulatory Visit: Payer: Self-pay

## 2018-12-15 VITALS — BP 146/78 | HR 64 | Wt 212.0 lb

## 2018-12-15 DIAGNOSIS — I872 Venous insufficiency (chronic) (peripheral): Secondary | ICD-10-CM

## 2018-12-15 MED ORDER — DOXYCYCLINE HYCLATE 100 MG PO TABS: 100.0000 mg | ORAL_TABLET | Freq: Two times a day (BID) | ORAL | 0 refills | Status: DC

## 2018-12-15 NOTE — Progress Notes (Signed)
Martha Perkins is a 73 y.o. female who presents to [place] [place] today for right leg weeping. Martha Perkins notes a 2-day history of slight redness at the anterior right lower leg associated with clear fluid.  She denies any pain fevers or chills.  She had a similar episode to her left leg in the past.  She does have a prescription of Lasix that she can take intermittently but has not been taking it.  She has trouble getting compression stockings on so has not been using them.  She feels well otherwise with no fevers chills nausea vomiting or diarrhea.  She has a follow-up appointment scheduled with her primary care provider on October 19.    ROS as above:  Exam:  BP (!) 146/78    Pulse 64    Wt 212 lb (96.2 kg)    BMI 40.06 kg/m  Wt Readings from Last 5 Encounters:  12/15/18 212 lb (96.2 kg)  09/20/18 209 lb (94.8 kg)  08/27/18 209 lb (94.8 kg)  08/03/18 205 lb (93 kg)  05/20/18 207 lb (93.9 kg)    Gen: Well NAD nontoxic appearing HEENT: EOMI,  MMM Lungs: Normal work of breathing. CTABL Heart: RRR no MRG Abd: NABS, Soft. Nondistended, Nontender Exts: Brisk capillary refill, warm and well perfused.  2+ nonpitting edema bilateral lower extremities.  Slight clear fluid at right anterior lower leg.  No significant erythema.  Nontender.  Not hot to touch.  Calf diameters are equal.  No palpable cords.  Normal foot motion.  Lab and Radiology Results Creatinine last checked about 3 months ago was normal.   Assessment and Plan: 73 y.o. female with  Bilateral lower extremity edema with right leg weeping.  Likely venous stasis dermatitis developing in right leg.  Plan for compression stockings if able.  Provided patient with a few other ways to apply or use compression stockings.  Next Also recommend restarting Lasix.  Start at 20 mg daily okay to increase to 40 mg daily if needed.   Plan to recheck with PCP as scheduled on the 19th.  I have provided a printed prescription of doxycycline that she can start taking if the leg becomes more red or more painful signs of developing cellulitis.  Right now I do not think that she has cellulitis but that certainly a risk at this point.  Watchful waiting. Patient elects to defer flu vaccine until her follow-up with Dr. Charise Carwin.  Meds ordered this encounter  Medications   doxycycline (VIBRA-TABS) 100 MG tablet    Sig: Take 1 tablet (100 mg total) by mouth 2 (two) times daily.    Dispense:  14 tablet    Refill:  0     Historical information moved to improve visibility of documentation.  Past Medical History:  Diagnosis Date   Depression    Hyperlipidemia    Hypertension    Hyperthyroidism 08/09/2015   Kidney stone    Paroxysmal atrial fibrillation ([place]) 08/09/2015   Thyroid disease    Past Surgical History:  Procedure Laterality Date   CARDIOVERSION N/A 08/05/2016   Procedure: CARDIOVERSION;  Surgeon: Skeet Latch, MD;  Location: [place];  Service: Cardiovascular;  Laterality: N/A;   CATARACT EXTRACTION Left 09/2012   Dr. Bunnie Philips   LITHOTRIPSY     REPLACEMENT TOTAL KNEE BILATERAL  2011   THYROIDECTOMY  1971   TUBAL LIGATION  1979   Social History   Tobacco Use  Smoking status: Never Smoker   Smokeless tobacco: Never Used  Substance Use Topics   Alcohol use: No   family history includes Coronary artery disease (age of onset: 32) in her mother; Depression in her mother; Diabetes in her paternal grandmother; Hyperlipidemia in her mother; Thyroid disease in her sister.  Medications: Current Outpatient Medications  Medication Sig Dispense Refill   acetaminophen (TYLENOL) 650 MG CR tablet Take 1 tablet (650 mg total) by mouth every 8 (eight) hours. 90 tablet 3   albuterol (PROVENTIL HFA;VENTOLIN HFA) 108 (90 Base) MCG/ACT inhaler INHALE 2 PUFFS BY MOUTH EVERY 6 HOURS AS NEEDED FOR  WHEEZING OR SHORTNESS OF BREATH 9 g 0   atorvastatin (LIPITOR) 40 MG tablet Take 1 tablet (40 mg total) by mouth daily. 90 tablet 3   clotrimazole-betamethasone (LOTRISONE) cream Apply 1 application topically 2 (two) times daily. 45 g 2   diltiazem (CARDIZEM CD) 240 MG 24 hr capsule Take 1 capsule (240 mg total) by mouth daily. 90 capsule 3   doxycycline (VIBRA-TABS) 100 MG tablet Take 1 tablet (100 mg total) by mouth 2 (two) times daily. 14 tablet 0   esomeprazole (NEXIUM) 40 MG capsule Take 1 capsule (40 mg total) by mouth daily. 90 capsule 0   fluticasone (FLONASE) 50 MCG/ACT nasal spray USE 1 SPRAY IN EACH NOSTRIL TWO TIMES DAILY 48 g 3   furosemide (LASIX) 20 MG tablet Take 1 tablet (20 mg total) by mouth daily as needed (SWELLING). 90 tablet 1   ketoconazole (NIZORAL) 2 % cream Apply topically daily as needed for irritation. 60 g 3   levothyroxine (SYNTHROID) 112 MCG tablet Take 1 tablet (112 mcg total) by mouth daily before breakfast. 90 tablet 3   meloxicam (MOBIC) 15 MG tablet TAKE 1 TABLET BY MOUTH  DAILY 90 tablet 2   metoprolol succinate (TOPROL-XL) 50 MG 24 hr tablet TAKE 1 TABLET BY MOUTH  EVERY DAY WITH OR  IMMEDIATELY FOLLOWING A  MEAL 90 tablet 2   potassium chloride (K-DUR) 10 MEQ tablet Take 1 tablet (10 mEq total) by mouth daily as needed. 30 tablet 1   terbinafine (LAMISIL) 250 MG tablet Take 1 tablet (250 mg total) by mouth daily. 90 tablet 0   traMADol (ULTRAM) 50 MG tablet Take 1 tablet by mouth three times daily as needed 270 tablet 0   No current facility-administered medications for this visit.    Allergies  Allergen Reactions   Demerol Nausea Only   Meperidine Hcl Other (See Comments)    Feels loopy   Oxycodone-Aspirin Other (See Comments)    Feels loopy   Percocet [Oxycodone-Acetaminophen] Other (See Comments)    Feels loopy     Discussed warning signs or symptoms. Please see discharge instructions. Patient expresses understanding.

## 2018-12-15 NOTE — Patient Instructions (Signed)
Thank you for coming in today. I think this is the beginning of Martha Perkins and not infection Re-start Lasix. Take 20mg  daily.  IF you do not pee a lot with that dose ok to increase to 40mg  daily (2 pills). Try to use compression stockings if you can.  If the redness gets worse and painful fill and start taking doxycycline antibiotic.   Follow up with Dr Madilyn Fireman as scheduled on the 19th.    If this great idea of mine is not working out let me know.

## 2018-12-21 ENCOUNTER — Ambulatory Visit: Payer: Medicare Other | Admitting: Family Medicine

## 2018-12-21 ENCOUNTER — Encounter: Payer: Self-pay | Admitting: Cardiovascular Disease

## 2018-12-21 ENCOUNTER — Ambulatory Visit: Payer: Medicare Other | Admitting: Cardiovascular Disease

## 2018-12-21 ENCOUNTER — Other Ambulatory Visit: Payer: Self-pay

## 2018-12-21 VITALS — BP 134/81 | HR 65 | Ht 62.0 in | Wt 211.0 lb

## 2018-12-21 DIAGNOSIS — I10 Essential (primary) hypertension: Secondary | ICD-10-CM

## 2018-12-21 DIAGNOSIS — I480 Paroxysmal atrial fibrillation: Secondary | ICD-10-CM | POA: Diagnosis not present

## 2018-12-21 DIAGNOSIS — E7800 Pure hypercholesterolemia, unspecified: Secondary | ICD-10-CM

## 2018-12-21 DIAGNOSIS — R0602 Shortness of breath: Secondary | ICD-10-CM | POA: Diagnosis not present

## 2018-12-21 NOTE — Patient Instructions (Addendum)
Medication Instructions:  Your physician recommends that you continue on your current medications as directed. Please refer to the Current Medication list given to you today.  If you need a refill on your cardiac medications before your next appointment, please call your pharmacy.   Lab work: NONE  Testing/Procedures: Your physician has requested that you have an echocardiogram. Echocardiography is a painless test that uses sound waves to create images of your heart. It provides your doctor with information about the size and shape of your heart and how well your heart's chambers and valves are working. This procedure takes approximately one hour. There are no restrictions for this procedure.  Follow-Up: At [facility], you and your health needs are our priority.  As part of our continuing mission to provide you with exceptional heart care, we have created designated Provider Care Teams.  These Care Teams include your primary Cardiologist (physician) and Advanced Practice Providers (APPs -  Physician Assistants and Nurse Practitioners) who all work together to provide you with the care you need, when you need it. You will need a follow up appointment in 3 months.  Please call our office 2 months in advance to schedule this appointment.  You may see [facility] or one of the following Advanced Practice Providers on your designated Care Team:   [name], PA-C Roby Lofts, [place] . Sande Rives, PA-C  You have been referred to  [place]. IF YOU DO NOT HEAR FROM THEM YOU CAN CALL THEM DIRECTLY AT [phone]

## 2018-12-21 NOTE — Progress Notes (Signed)
Cardiology Office Note   Date:  12/21/2018   ID:  Martha Perkins, DOB 1945-10-11, MRN [id number]  PCP:  Martha Marry, MD  Cardiologist:   Martha Latch, MD   No chief complaint on file.    History of Present Illness: Martha Perkins is a 73 y.o. female with paroxysmal atrial fibrillation, Grave's disease, hypertension, white coat hypertension, hyperlipidemia, and fatty liver disease who presents follow up on atrial fibrillation.  [name] was seen by her PCP, Dr. Beatrice Perkins, on 07/13/15 for newly-diagnosed atrial fibrillation.  She was started on Eliquis and a low-dose beta blocker. Lab work revealed that she was hyperthyroid.  She had a thyroid uptake scan that revealed an enlarged, hyperfunctioning left lobe of the thyroid gland without discrete nodule. Findings were thought to be consistent with Graves' disease. She also underwent radioactive iodine therapy. She was seen in clinic 08/09/15 and metoprolol was increased.  Lisinopril was discontinued. She had an echo 08/2015 that revealed LVEF 50-55% with moderate left atrial enlargement, mild MR and TR.  [name] underwent cardioversion on 08/05/16.    Since her last appointment she has been struggling wight LE edema bilaterally.  This has been ongoing for a few months.  She has compression socks but hasn't used them.  She was concerned that it may be cellulitis but denies any pain or warmth.  She denies orthopnea or PND.  She has noted some exertional dyspnea but no chest pain.  She occasionally notes fluttering in her chest but it only last for a few seconds.  She has not experienced any sustained atrial fibrillation.  She notes that she has been gaining weight and has not been exercising much lately.  She has been eating a lot more lately because she is feeling depressed due to being at home to avoid the coronavirus.  She thinks that this is most likely the cause of her shortness of breath.  She struggles with arthritis in both  shoulders.  Since her last appointment her husband was diagnosed with non-Hodgkin's lymphoma.  He has completed treatment but still has to stay home due to neutropenia.    Past Medical History:  Diagnosis Date   Depression    Hyperlipidemia    Hypertension    Hyperthyroidism 08/09/2015   Kidney stone    Paroxysmal atrial fibrillation ([place]) 08/09/2015   Thyroid disease     Past Surgical History:  Procedure Laterality Date   CARDIOVERSION N/A 08/05/2016   Procedure: CARDIOVERSION;  Surgeon: Martha Latch, MD;  Location: [place];  Service: Cardiovascular;  Laterality: N/A;   CATARACT EXTRACTION Left 09/2012   Dr. Bunnie Philips   LITHOTRIPSY     REPLACEMENT TOTAL KNEE BILATERAL  2011   THYROIDECTOMY  1971   TUBAL LIGATION  1979     Current Outpatient Medications  Medication Sig Dispense Refill   acetaminophen (TYLENOL) 650 MG CR tablet Take 1 tablet (650 mg total) by mouth every 8 (eight) hours. 90 tablet 3   albuterol (PROVENTIL HFA;VENTOLIN HFA) 108 (90 Base) MCG/ACT inhaler INHALE 2 PUFFS BY MOUTH EVERY 6 HOURS AS NEEDED FOR WHEEZING OR SHORTNESS OF BREATH 9 g 0   atorvastatin (LIPITOR) 40 MG tablet Take 1 tablet (40 mg total) by mouth daily. 90 tablet 3   clotrimazole-betamethasone (LOTRISONE) cream Apply 1 application topically 2 (two) times daily. 45 g 2   diltiazem (CARDIZEM CD) 240 MG 24 hr capsule Take 1 capsule (240 mg total) by mouth daily. 90 capsule 3  doxycycline (VIBRA-TABS) 100 MG tablet Take 1 tablet (100 mg total) by mouth 2 (two) times daily. 14 tablet 0   fluticasone (FLONASE) 50 MCG/ACT nasal spray USE 1 SPRAY IN EACH NOSTRIL TWO TIMES DAILY 48 g 3   furosemide (LASIX) 20 MG tablet Take 1 tablet (20 mg total) by mouth daily as needed (SWELLING). 90 tablet 1   ketoconazole (NIZORAL) 2 % cream Apply topically daily as needed for irritation. 60 g 3   levothyroxine (SYNTHROID) 112 MCG tablet Take 1 tablet (112 mcg total) by mouth daily  before breakfast. 90 tablet 3   meloxicam (MOBIC) 15 MG tablet TAKE 1 TABLET BY MOUTH  DAILY 90 tablet 2   metoprolol succinate (TOPROL-XL) 50 MG 24 hr tablet TAKE 1 TABLET BY MOUTH  EVERY DAY WITH OR  IMMEDIATELY FOLLOWING A  MEAL 90 tablet 2   potassium chloride (K-DUR) 10 MEQ tablet Take 1 tablet (10 mEq total) by mouth daily as needed. 30 tablet 1   terbinafine (LAMISIL) 250 MG tablet Take 1 tablet (250 mg total) by mouth daily. 90 tablet 0   traMADol (ULTRAM) 50 MG tablet Take 1 tablet by mouth three times daily as needed 270 tablet 0   No current facility-administered medications for this visit.     Allergies:   Demerol, Meperidine hcl, Oxycodone-aspirin, and Percocet [oxycodone-acetaminophen]    Social History:  The patient  reports that she has never smoked. She has never used smokeless tobacco. She reports that she does not drink alcohol or use drugs.   Family History:  The patient's family history includes Coronary artery disease (age of onset: 52) in her mother; Depression in her mother; Diabetes in her paternal grandmother; Hyperlipidemia in her mother; Thyroid disease in her sister.    ROS:  Please see the history of present illness.   Otherwise, review of systems are positive for none.   All other systems are reviewed and negative.    PHYSICAL EXAM: VS:  BP 134/81    Pulse 65    Ht 5\' 2"  (1.575 m)    Wt 211 lb (95.7 kg)    SpO2 98%    BMI 38.59 kg/m  , BMI Body mass index is 38.59 kg/m. GENERAL:  Well appearing.  No acute distress HEENT:  Pupils equal round and reactive, fundi not visualized, oral mucosa unremarkable NECK:  No jugular venous distention, waveform within normal limits, carotid upstroke brisk and symmetric, no bruits LUNGS:  Clear to auscultation bilaterally HEART:  RRR.  PMI not displaced or sustained,S1 and S2 within normal limits, no S3, no S4, no clicks, no rubs, I/VI systolic murmur at  ABD:  Flat, positive bowel sounds normal in frequency in pitch,  no bruits, no rebound, no guarding, no midline pulsatile mass, no hepatomegaly, no splenomegaly EXT:  2 plus pulses throughout, 2+ tense LE edema bilatearlly, no cyanosis no clubbing SKIN:  Erythematous stasis dermatitis on the anterior tibia bilaterally NEURO:  Cranial nerves II through XII grossly intact, motor grossly intact throughout PSYCH:  Cognitively intact, oriented to person place and time  EKG:  EKG is ordered today. The ekg ordered 12/11/15 demonstatrial fibrillation. Rate 64 bpm. PVC. Prior septal infarct.  Right axis deviation. 07/18/16: Atrial fibrillation.  Rate 80 bpm.    09/23/16:  Sinus bradycardia. Rate 58 bpm. Septal infarct. 12/21/18: Sinus rhythm.  Rate 65 bpm.  Poor R wave progression.  Recent Labs: 01/29/2018: TSH 10.34 09/20/2018: ALT 53; Brain Natriuretic Peptide 30; BUN 16; Creat 0.72; Potassium  4.2; Sodium 140    Lipid Panel    Component Value Date/Time   CHOL 205 (H) 01/29/2018 1052   TRIG 248 (H) 01/29/2018 1052   HDL 44 (L) 01/29/2018 1052   CHOLHDL 4.7 01/29/2018 1052   VLDL 34 (H) 07/02/2015 0959   LDLCALC 123 (H) 01/29/2018 1052      Wt Readings from Last 3 Encounters:  12/21/18 211 lb (95.7 kg)  12/15/18 212 lb (96.2 kg)  09/20/18 209 lb (94.8 kg)      ASSESSMENT AND PLAN:  # Shortness of breath: Ms. Schwaiger reports increased dyspnea on exertion.  She attributes this to weight gain, and it very well may be.  However she also has some lower extremity edema.  I suspect the edema is related to venous stasis.  However given this combination of symptoms we will get an echocardiogram.  BNP was within normal limits 09/2018.  # Atrial fibrillation: Ms. Deruyter remains in sinus rhythm.  She has had some short fluttering episodes but no sustained arrhythmia. Continue diltiazem and metoprolol.  It is unclear when she stopped Eliquis.  This patients CHA2DS2-VASc Score and unadjusted Ischemic Stroke Rate (% per year) is equal to 3.2 % stroke rate/year from a  score of 3  Above score calculated as 1 point each if present [CHF, HTN, DM, Vascular=MI/PAD/Aortic Plaque, Age if 65-74, or Female] Above score calculated as 2 points each if present [Age > 75, or Stroke/TIA/TE]  # Hypertension:  Blood pressure is nearly controlled.  Will work on diet and exercise rather than increasing her antihypertensive regimen at this time.  # Hyperlipidemia: Continue atorvastatin 40mg  daily.  Okay okay  # Morbid obseity: Healthy Weight and Wellness   Current medicines are reviewed at length with the patient today.  The patient does not have concerns regarding medicines.  The following changes have been made: none  Labs/ tests ordered today include:   Orders Placed This Encounter  Procedures   Ambulatory referral to Family Practice   EKG 12-Lead   ECHOCARDIOGRAM COMPLETE     Disposition:   FU with Reinaldo Helt C. Oval Linsey, MD, [facility] in 3 months   This note was written with the assistance of speech recognition software.  Please excuse any transcriptional errors.  Signed, Caswell Alvillar C. Oval Linsey, MD, [facility]  12/21/2018 6:08 PM    [place]

## 2018-12-22 ENCOUNTER — Ambulatory Visit: Payer: Medicare Other | Admitting: Family Medicine

## 2018-12-27 ENCOUNTER — Ambulatory Visit: Payer: Medicare Other | Admitting: Family Medicine

## 2018-12-29 ENCOUNTER — Other Ambulatory Visit: Payer: Self-pay

## 2018-12-29 ENCOUNTER — Telehealth: Payer: Self-pay | Admitting: *Deleted

## 2018-12-29 ENCOUNTER — Encounter: Payer: Self-pay | Admitting: Family Medicine

## 2018-12-29 ENCOUNTER — Ambulatory Visit (INDEPENDENT_AMBULATORY_CARE_PROVIDER_SITE_OTHER): Payer: Medicare Other | Admitting: Family Medicine

## 2018-12-29 ENCOUNTER — Other Ambulatory Visit: Payer: Self-pay | Admitting: Family Medicine

## 2018-12-29 VITALS — BP 139/57 | HR 64 | Ht 62.0 in | Wt 211.0 lb

## 2018-12-29 DIAGNOSIS — E8881 Metabolic syndrome: Secondary | ICD-10-CM | POA: Diagnosis not present

## 2018-12-29 DIAGNOSIS — E890 Postprocedural hypothyroidism: Secondary | ICD-10-CM | POA: Diagnosis not present

## 2018-12-29 DIAGNOSIS — Z23 Encounter for immunization: Secondary | ICD-10-CM | POA: Diagnosis not present

## 2018-12-29 DIAGNOSIS — I10 Essential (primary) hypertension: Secondary | ICD-10-CM

## 2018-12-29 DIAGNOSIS — Z6838 Body mass index (BMI) 38.0-38.9, adult: Secondary | ICD-10-CM

## 2018-12-29 DIAGNOSIS — N6452 Nipple discharge: Secondary | ICD-10-CM | POA: Insufficient documentation

## 2018-12-29 LAB — POCT GLYCOSYLATED HEMOGLOBIN (HGB A1C): Hemoglobin A1C: 5.5 % (ref 4.0–5.6)

## 2018-12-29 NOTE — Telephone Encounter (Signed)
Call to follow up on Martha Perkins appointment with [facility], mailbox was full.

## 2018-12-29 NOTE — Progress Notes (Signed)
Established Patient Office Visit  Subjective:  Patient ID: Martha Perkins, female    DOB: Aug 01, 1945  Age: 73 y.o. MRN: [id number]  CC:  Chief Complaint  Patient presents with  . Hypertension  . ifg    HPI Martha Perkins presents for   Hypertension- Pt denies chest pain, SOB, dizziness, or heart palpitations.  Taking meds as directed w/o problems.  Denies medication side effects.    Impaired fasting glucose-no increased thirst or urination. No symptoms consistent with hypoglycemia.   Past Medical History:  Diagnosis Date  . Depression   . Hyperlipidemia   . Hypertension   . Hyperthyroidism 08/09/2015  . Kidney stone   . Paroxysmal atrial fibrillation ([place]) 08/09/2015  . Thyroid disease     Past Surgical History:  Procedure Laterality Date  . CARDIOVERSION N/A 08/05/2016   Procedure: CARDIOVERSION;  Surgeon: Skeet Latch, MD;  Location: [place];  Service: Cardiovascular;  Laterality: N/A;  . CATARACT EXTRACTION Left 09/2012   Dr. Bunnie Philips  . LITHOTRIPSY    . REPLACEMENT TOTAL KNEE BILATERAL  2011  . THYROIDECTOMY  1971  . TUBAL LIGATION  1979    Family History  Problem Relation Age of Onset  . Coronary artery disease Mother 39       deceased  . Depression Mother   . Hyperlipidemia Mother   . Diabetes Paternal Grandmother   . Thyroid disease Sister     Social History   Socioeconomic History  . Marital status: Married    Spouse name: Not on file  . Number of children: Not on file  . Years of education: Not on file  . Highest education level: Not on file  Occupational History  . Occupation: Retired Therapist, sports.   Social Needs  . Financial resource strain: Not on file  . Food insecurity    Worry: Not on file    Inability: Not on file  . Transportation needs    Medical: Not on file    Non-medical: Not on file  Tobacco Use  . Smoking status: Never Smoker  . Smokeless tobacco: Never Used  Substance and Sexual Activity  . Alcohol use: No  . Drug use: No   . Sexual activity: Not on file  Lifestyle  . Physical activity    Days per week: Not on file    Minutes per session: Not on file  . Stress: Not on file  Relationships  . Social Herbalist on phone: Not on file    Gets together: Not on file    Attends religious service: Not on file    Active member of club or organization: Not on file    Attends meetings of clubs or organizations: Not on file    Relationship status: Not on file  . Intimate partner violence    Fear of current or ex partner: Not on file    Emotionally abused: Not on file    Physically abused: Not on file    Forced sexual activity: Not on file  Other Topics Concern  . Not on file  Social History Narrative  . Not on file    Outpatient Medications Prior to Visit  Medication Sig Dispense Refill  . acetaminophen (TYLENOL) 650 MG CR tablet Take 1 tablet (650 mg total) by mouth every 8 (eight) hours. 90 tablet 3  . albuterol (PROVENTIL HFA;VENTOLIN HFA) 108 (90 Base) MCG/ACT inhaler INHALE 2 PUFFS BY MOUTH EVERY 6 HOURS AS NEEDED FOR WHEEZING OR SHORTNESS OF BREATH  9 g 0  . atorvastatin (LIPITOR) 40 MG tablet Take 1 tablet (40 mg total) by mouth daily. 90 tablet 3  . clotrimazole-betamethasone (LOTRISONE) cream Apply 1 application topically 2 (two) times daily. 45 g 2  . diltiazem (CARDIZEM CD) 240 MG 24 hr capsule Take 1 capsule (240 mg total) by mouth daily. 90 capsule 3  . fluticasone (FLONASE) 50 MCG/ACT nasal spray USE 1 SPRAY IN EACH NOSTRIL TWO TIMES DAILY 48 g 3  . furosemide (LASIX) 20 MG tablet Take 1 tablet (20 mg total) by mouth daily as needed (SWELLING). 90 tablet 1  . ketoconazole (NIZORAL) 2 % cream Apply topically daily as needed for irritation. 60 g 3  . levothyroxine (SYNTHROID) 112 MCG tablet Take 1 tablet (112 mcg total) by mouth daily before breakfast. 90 tablet 3  . meloxicam (MOBIC) 15 MG tablet TAKE 1 TABLET BY MOUTH  DAILY 90 tablet 2  . metoprolol succinate (TOPROL-XL) 50 MG 24 hr  tablet TAKE 1 TABLET BY MOUTH  EVERY DAY WITH OR  IMMEDIATELY FOLLOWING A  MEAL 90 tablet 2  . potassium chloride (K-DUR) 10 MEQ tablet Take 1 tablet (10 mEq total) by mouth daily as needed. 30 tablet 1  . traMADol (ULTRAM) 50 MG tablet Take 1 tablet by mouth three times daily as needed 270 tablet 0  . doxycycline (VIBRA-TABS) 100 MG tablet Take 1 tablet (100 mg total) by mouth 2 (two) times daily. 14 tablet 0  . terbinafine (LAMISIL) 250 MG tablet Take 1 tablet (250 mg total) by mouth daily. 90 tablet 0   No facility-administered medications prior to visit.     Allergies  Allergen Reactions  . Demerol Nausea Only  . Meperidine Hcl Other (See Comments)    Feels loopy  . Oxycodone-Aspirin Other (See Comments)    Feels loopy  . Percocet [Oxycodone-Acetaminophen] Other (See Comments)    Feels loopy    ROS Review of Systems    Objective:    Physical Exam  Constitutional: She is oriented to person, place, and time. She appears well-developed and well-nourished.  HENT:  Head: Normocephalic and atraumatic.  Cardiovascular: Normal rate, regular rhythm and normal heart sounds.  Pulmonary/Chest: Effort normal and breath sounds normal.  Neurological: She is alert and oriented to person, place, and time.  Skin: Skin is warm and dry.  Psychiatric: She has a normal mood and affect. Her behavior is normal.    BP (!) 139/57   Pulse 64   Ht 5\' 2"  (1.575 m)   Wt 211 lb (95.7 kg)   SpO2 99%   BMI 38.59 kg/m  Wt Readings from Last 3 Encounters:  12/29/18 211 lb (95.7 kg)  12/21/18 211 lb (95.7 kg)  12/15/18 212 lb (96.2 kg)     Health Maintenance Due  Topic Date Due  . INFLUENZA VACCINE  10/09/2018    There are no preventive care reminders to display for this patient.  Lab Results  Component Value Date   TSH 10.34 (H) 01/29/2018   Lab Results  Component Value Date   WBC 5.8 12/08/2016   HGB 13.5 12/08/2016   HCT 38.7 12/08/2016   MCV 91.5 12/08/2016   PLT 203  12/08/2016   Lab Results  Component Value Date   NA 140 09/20/2018   K 4.2 09/20/2018   CO2 27 09/20/2018   GLUCOSE 105 (H) 09/20/2018   BUN 16 09/20/2018   CREATININE 0.72 09/20/2018   BILITOT 1.0 09/20/2018   ALKPHOS 119 08/25/2016  AST 35 09/20/2018   ALT 53 (H) 09/20/2018   PROT 6.9 09/20/2018   ALBUMIN 4.4 08/25/2016   CALCIUM 9.4 09/20/2018   Lab Results  Component Value Date   CHOL 205 (H) 01/29/2018   Lab Results  Component Value Date   HDL 44 (L) 01/29/2018   Lab Results  Component Value Date   LDLCALC 123 (H) 01/29/2018   Lab Results  Component Value Date   TRIG 248 (H) 01/29/2018   Lab Results  Component Value Date   CHOLHDL 4.7 01/29/2018   Lab Results  Component Value Date   HGBA1C 5.5 12/29/2018      Assessment & Plan:   Problem List Items Addressed This Visit      Cardiovascular and Mediastinum   Hypertension    BP borderline today. Discussed work on diet and exercise.          Endocrine   Insulin resistance - Primary    A1c looks phenomenal today at 5.5.  Continue current regimen.  Continue to work on healthy diet and regular exercise.  I would really like for her to lose about 20 pounds I think it would make a big difference in her blood pressure etc.      Relevant Orders   POCT glycosylated hemoglobin (Hb A1C) (Completed)   Hypothyroidism    Last TSH was not at goal.  Plan to recheck that today and make any adjustments needed.  This may help in her attempts for weight loss if we make sure that her thyroid is well regulated.  I also had a long discussion today about trying to start walking and getting more active in general.      Relevant Orders   TSH     Other   Bloody discharge from right nipple    Other Visit Diagnoses    Need for immunization against influenza       Relevant Orders   Flu Vaccine QUAD High Dose(Fluad) (Completed)   BMI 38.0-38.9,adult         BMI 38-we will recheck her thyroid.  No orders of the  defined types were placed in this encounter.   Follow-up: Return in about 6 months (around 06/29/2019) for Hypertension.    Beatrice Lecher, MD

## 2018-12-29 NOTE — Assessment & Plan Note (Signed)
A1c looks phenomenal today at 5.5.  Continue current regimen.  Continue to work on healthy diet and regular exercise.  I would really like for her to lose about 20 pounds I think it would make a big difference in her blood pressure etc.

## 2018-12-29 NOTE — Assessment & Plan Note (Signed)
BP borderline today. Discussed work on diet and exercise.

## 2018-12-29 NOTE — Assessment & Plan Note (Signed)
Last TSH was not at goal.  Plan to recheck that today and make any adjustments needed.  This may help in her attempts for weight loss if we make sure that her thyroid is well regulated.  I also had a long discussion today about trying to start walking and getting more active in general.

## 2018-12-30 ENCOUNTER — Ambulatory Visit (HOSPITAL_COMMUNITY): Payer: Medicare Other | Attending: Cardiology

## 2018-12-30 ENCOUNTER — Other Ambulatory Visit: Payer: Self-pay

## 2018-12-30 DIAGNOSIS — R0602 Shortness of breath: Secondary | ICD-10-CM | POA: Diagnosis not present

## 2018-12-30 LAB — TSH: TSH: 4.92 mIU/L — ABNORMAL HIGH (ref 0.40–4.50)

## 2019-01-06 ENCOUNTER — Other Ambulatory Visit: Payer: Self-pay

## 2019-01-06 DIAGNOSIS — D2362 Other benign neoplasm of skin of left upper limb, including shoulder: Secondary | ICD-10-CM | POA: Diagnosis not present

## 2019-01-06 DIAGNOSIS — L988 Other specified disorders of the skin and subcutaneous tissue: Secondary | ICD-10-CM | POA: Diagnosis not present

## 2019-01-11 NOTE — Telephone Encounter (Signed)
Opened in Error.

## 2019-01-14 ENCOUNTER — Ambulatory Visit (INDEPENDENT_AMBULATORY_CARE_PROVIDER_SITE_OTHER): Payer: Medicare Other | Admitting: Family Medicine

## 2019-01-14 ENCOUNTER — Encounter: Payer: Self-pay | Admitting: Family Medicine

## 2019-01-14 DIAGNOSIS — N6452 Nipple discharge: Secondary | ICD-10-CM | POA: Diagnosis not present

## 2019-01-14 MED ORDER — AZITHROMYCIN 250 MG PO TABS: ORAL_TABLET | ORAL | 0 refills | Status: AC

## 2019-01-14 NOTE — Progress Notes (Signed)
Virtual Visit via Video Note  I connected with Martha Perkins on 01/14/19 at  4:00 PM EST by a video enabled telemedicine application and verified that I am speaking with the correct person using two identifiers.   I discussed the limitations of evaluation and management by telemedicine and the availability of in person appointments. The patient expressed understanding and agreed to proceed.  Subjective:    CC: sinus issues  HPI:   73 yo female c/o of sinus symptoms for about 2 days.  She says this fall she has been battling her typical allergy and sinus congestion symptoms.  She takes Claritin at bedtime and uses Flonase daily but in the last couple of days she has had a significant increase in postnasal drip and discharge and this morning woke up with roaring in her left ear and her right ear has been popping.  She has had significant increase in pressure in both maxillary cheeks as well as behind her eyes.  She has not had a sore throat.  No cough or shortness of breath.  No sick contacts.  No GI symptoms such as diarrhea or nausea.  She denies any loss of taste or smell.  She feels like it is her typical sinus infection.  She also reports pain in her left foot.  She says when she woke up either on Saturday or Sunday and first put her foot on the ground it was quite painful.  She did not remember any type of trauma or injury the day before.  She says is been painful ever since then today when she tried to go to the grocery store she said she could even get her usual shoe on.  She always has some swelling in her feet but not usually this much.  She says it does not look red and there is no rash.  She says it is painful to walk on it is mostly on the top of her foot.  Is not over the toes.  She has been using Tylenol and just trying to stay off of it she says it does feel better when she is not on it.  Past medical history, Surgical history, Family history not pertinant except as noted below, Social  history, Allergies, and medications have been entered into the medical record, reviewed, and corrections made.   Review of Systems: No fevers, chills, night sweats, weight loss, chest pain, or shortness of breath.   Objective:    General: Speaking clearly in complete sentences without any shortness of breath.  Alert and oriented x3.  Normal judgment. No apparent acute distress.    Impression and Recommendations:    Acute sinusitis-we will treat with azithromycin.  If not better in 1 week and please give [place] a call back.  Did encourage her to maybe give it a couple days to see if it may just be viral.  Ear pain-likely related to her sinus pressure.  Certainly if she develops increasing pain or drainage let us know.  Rhinitis-continue with Claritin and Flonase.  Left foot pain-unclear etiology difficult to assess without doing exam.  If pain continues over the weekend after resting, elevating and icing and taking anti-inflammatories as needed then please come in for further evaluation.  Consider x-rays and possible testing for uric acid levels.      I discussed the assessment and treatment plan with the patient. The patient was provided an opportunity to ask questions and all were answered. The patient agreed with the plan and demonstrated  an understanding of the instructions.   The patient was advised to call back or seek an in-person evaluation if the symptoms worsen or if the condition fails to improve as anticipated.   Beatrice Lecher, MD

## 2019-02-01 ENCOUNTER — Ambulatory Visit (INDEPENDENT_AMBULATORY_CARE_PROVIDER_SITE_OTHER): Payer: Medicare Other

## 2019-02-01 ENCOUNTER — Ambulatory Visit (INDEPENDENT_AMBULATORY_CARE_PROVIDER_SITE_OTHER): Payer: Medicare Other | Admitting: Family Medicine

## 2019-02-01 ENCOUNTER — Other Ambulatory Visit: Payer: Self-pay

## 2019-02-01 ENCOUNTER — Encounter: Payer: Self-pay | Admitting: Family Medicine

## 2019-02-01 VITALS — BP 137/71 | HR 65 | Ht 62.0 in | Wt 211.0 lb

## 2019-02-01 DIAGNOSIS — M79672 Pain in left foot: Secondary | ICD-10-CM | POA: Diagnosis not present

## 2019-02-01 DIAGNOSIS — Z6838 Body mass index (BMI) 38.0-38.9, adult: Secondary | ICD-10-CM | POA: Diagnosis not present

## 2019-02-01 NOTE — Patient Instructions (Addendum)
Posterior Tibial Tendinitis Posterior tibial tendinitis is irritation of a tendon called the posterior tibial tendon. Your posterior tibial tendon is a cord-like tissue that connects bones of your lower leg and foot to a muscle that:  Supports your arch.  Helps you raise up on your toes.  Helps you turn your foot down and in. This condition causes foot and ankle pain. It can also lead to a flat foot. What are the causes? This condition is most often caused by repeated stress to the tendon (overuse injury). It can also be caused by a sudden injury that stresses the tendon, such as landing on your foot after jumping or falling. What increases the risk? This condition is more likely to develop in:  People who play a sport that involves putting a lot of pressure on the feet, such as: ? Basketball. ? Tennis. ? Soccer. ? Hockey.  Runners.  Females who are older than 70 years of age and are overweight.  People with diabetes.  People with decreased foot stability.  People with flat feet. What are the signs or symptoms? Symptoms include:  Pain in the inner ankle.  Pain at the arch of your foot.  Pain that gets worse with running, walking, or standing.  Swelling on the inside of your ankle and foot.  Weakness in your ankle or foot.  Inability to stand up on tiptoe.  Flattening of the arch of your foot. How is this diagnosed? This condition may be diagnosed based on:  Your symptoms.  Your medical history.  A physical exam.  Tests, such as: ? X-ray. ? MRI. ? Ultrasound. How is this treated? This condition may be treated by:  Putting ice to the injured area.  Taking NSAIDs, such as ibuprofen, to reduce pain and swelling.  Wearing a special shoe or shoe insert to support your arch (orthotic).  Having physical therapy.  Replacing high-impact exercise with low-impact exercise, such as swimming or cycling. If your symptoms do not improve with these treatments, you  may need to wear a splint, removable walking boot, or short leg cast for 6-8 weeks to keep your foot and ankle still (immobilized). Follow these instructions at home: If you have a cast, splint, or boot:  Keep it clean and dry.  Check the skin around it every day. Tell your health care provider about any concerns. If you have a cast:  Do not stick anything inside it to scratch your skin. Doing that increases your risk of infection.  You may put lotion on dry skin around the edges of the cast. Do not put lotion on the skin underneath the cast. If you have a splint or boot:  Wear it as told by your health care provider. Remove it only as told by your health care provider.  Loosen it if your toes tingle, become numb, or turn cold and blue. Bathing  Do not take baths, swim, or use a hot tub until your health care provider approves. Ask your health care provider if you may take showers.  If your cast, splint, or boot is not waterproof: ? Do not let it get wet. ? Cover it with a waterproof covering while you take a bath or a shower. Managing pain and swelling   If directed, put ice on the injured area. ? If you have a removable splint or boot, remove it as told by your health care provider. ? Put ice in a plastic bag. ? Place a towel between your skin and  the bag or between your cast and the bag. ? Leave the ice on for 20 minutes, 2-3 times a day.  Move your toes often to reduce stiffness and swelling.  Raise (elevate) the injured area above the level of your heart while you are sitting or lying down. Activity  Do not use the injured foot to support your body weight until your health care provider says that you can. Use crutches as told by your health care provider.  Do not do activities that make pain or swelling worse.  Ask your health care provider when it is safe to drive if you have a cast, splint, or boot on your foot.  Return to your normal activities as told by your  health care provider. Ask your health care provider what activities are safe for you.  Do exercises as told by your health care provider. General instructions  Take over-the-counter and prescription medicines only as told by your health care provider.  If you have an orthotic, use it as told by your health care provider.  Keep all follow-up visits as told by your health care provider. This is important. How is this prevented?  Wear footwear that is appropriate to your athletic activity.  Avoid athletic activities that cause pain or swelling in your ankle or foot.  Before being active, do range-of-motion and stretching exercises.  If you develop pain or swelling while training, stop training.  If you have pain or swelling that does not improve after a few days of rest, see your health care provider.  If you start a new athletic activity, start gradually so you can build up your strength and flexibility. Contact a health care provider if:  Your symptoms get worse.  Your symptoms do not improve in 6-8 weeks.  You develop new, unexplained symptoms.  Your splint, boot, or cast gets damaged. Summary  Posterior tibial tendinitis is irritation of a tendon called the posterior tibial tendon.  This condition is most often caused by repeated stress to the tendon (overuse injury).  This condition causes foot pain and ankle pain. It can also lead to a flat foot.  This condition may be treated by not doing high-impact activities, applying ice, having physical therapy, wearing orthotics, and wearing a cast, splint, or boot if needed. This information is not intended to replace advice given to you by your health care provider. Make sure you discuss any questions you have with your health care provider. Document Released: 02/24/2005 Document Revised: 06/22/2018 Document Reviewed: 04/29/2018 Elsevier Patient Education  [place].     Posterior Tibial Tendinitis Rehab Ask your  health care provider which exercises are safe for you. Do exercises exactly as told by your health care provider and adjust them as directed. It is normal to feel mild stretching, pulling, tightness, or discomfort as you do these exercises. Stop right away if you feel sudden pain or your pain gets worse. Do not begin these exercises until told by your health care provider. Stretching and range-of-motion exercises These exercises warm up your muscles and joints and improve the movement and flexibility in your ankle and foot. These exercises may also help to relieve pain. Standing wall calf stretch, knee straight  1. Stand with your hands against a wall. 2. Extend your left / right leg behind you, and bend your front knee slightly. If directed, place a folded washcloth under the arch of your foot for support. 3. Point the toes of your back foot slightly inward. 4.  Keeping your heels on the floor and your back knee straight, shift your weight toward the wall. Do not allow your back to arch. You should feel a gentle stretch in your upper left / right calf. 5. Hold this position for __________ seconds. Repeat __________ times. Complete this exercise __________ times a day. Standing wall calf stretch, knee bent 1. Stand with your hands against a wall. 2. Extend your left / right leg behind you, and bend your front knee slightly. If directed, place a folded washcloth under the arch of your foot for support. 3. Point the toes of your back foot slightly inward. 4. Unlock your back knee so it is bent. Keep your heels on the floor. You should feel a gentle stretch deep in your lower left / right calf. 5. Hold this position for __________ seconds. Repeat __________ times. Complete this exercise __________ times a day. Strengthening exercises These exercises build strength and endurance in your ankle and foot. Endurance is the ability to use your muscles for a long time, even after they get tired. Ankle  inversion with band 1. Secure one end of a rubber exercise band or tubing to a fixed object, such as a table leg or a pole, that will stay still when the band is pulled. 2. Loop the other end of the band around the middle of your left / right foot. 3. Sit on the floor facing the object with your left / right leg extended. The band or tube should be slightly tense when your foot is relaxed. 4. Leading with your big toe, slowly bring your left / right foot and ankle inward, toward your other foot (inversion). 5. Hold this position for __________ seconds. 6. Slowly return your foot to the starting position. Repeat __________ times. Complete this exercise __________ times a day. Towel curls  1. Sit in a chair on a non-carpeted surface, and put your feet on the floor. 2. Place a towel in front of your feet. If told by your health care provider, add a __________ weight to the end of the towel. 3. Keeping your heel on the floor, put your left / right foot on the towel. 4. Pull the towel toward you by grabbing the towel with your toes and curling them under. Keep your heel on the floor while you do this. 5. Let your toes relax. 6. Grab the towel with your toes again. Keep going until the towel is completely underneath your foot. Repeat __________ times. Complete this exercise __________ times a day. Balance exercise This exercise improves or maintains your balance. Balance is important in preventing falls. Single leg stand 1. Without wearing shoes, stand near a railing or in a doorway. You may hold on to the railing or door frame as needed for balance. 2. Stand on your left / right foot. Keep your big toe down on the floor and try to keep your arch lifted. ? If balancing in this position is too easy, try the exercise with your eyes closed or while standing on a pillow. 3. Hold this position for __________ seconds. Repeat __________ times. Complete this exercise __________ times a day. This  information is not intended to replace advice given to you by your health care provider. Make sure you discuss any questions you have with your health care provider. Document Released: 02/24/2005 Document Revised: 06/22/2018 Document Reviewed: 04/28/2018 Elsevier Patient Education  2020 [employer].

## 2019-02-01 NOTE — Progress Notes (Signed)
Acute Office Visit  Subjective:    Patient ID: Martha Perkins, female    DOB: 1945/06/01, 73 y.o.   MRN: [id number]  Chief Complaint  Patient presents with  . Foot Pain    HPI Patient is in today for left foot pain.  Please see note from July 6 when we did a virtual appointment:  "She also reports pain in her left foot.  She says when she woke up either on Saturday or Sunday and first put her foot on the ground it was quite painful.  She did not remember any type of trauma or injury the day before.  She says is been painful ever since then today when she tried to go to the grocery store she said she could even get her usual shoe on.  She always has some swelling in her feet but not usually this much.  She says it does not look red and there is no rash.  She says it is painful to walk on it is mostly on the top of her foot.  Is not over the toes.  She has been using Tylenol and just trying to stay off of it she says it does feel better when she is not on it"  At the time we just discussed elevating, icing, resting and taking an anti-inflammatory as needed and if not improving to consider further work-up with an in person evaluation with possible x-rays and maybe even uric acid levels.  Says her pain is mostly on the top of the foot towards the proximal end of the first metatarsal but occasionally pain radiates underneath the foot as well.  She says it will turn red in that area sometimes its not red today.  Noticed some new spots on her ankle a couple of days ago.  Thinks could be a mosquito bite.     He also had some questions about recent echocardiogram which showed that she had some mild diastolic dysfunction .   Also wanted to discuss weight loss today.  She knows she needs to really start working on losing weight.  BMI is 38.  She says she knows what she needs to do which is getting motivated.  She really cannot exercise consistently right now because of her back and her foot.  Past  Medical History:  Diagnosis Date  . Depression   . Hyperlipidemia   . Hypertension   . Hyperthyroidism 08/09/2015  . Kidney stone   . Paroxysmal atrial fibrillation ([place]) 08/09/2015  . Thyroid disease     Past Surgical History:  Procedure Laterality Date  . CARDIOVERSION N/A 08/05/2016   Procedure: CARDIOVERSION;  Surgeon: Skeet Latch, MD;  Location: [facility];  Service: Cardiovascular;  Laterality: N/A;  . CATARACT EXTRACTION Left 09/2012   Dr. Bunnie Philips  . LITHOTRIPSY    . REPLACEMENT TOTAL KNEE BILATERAL  2011  . THYROIDECTOMY  1971  . TUBAL LIGATION  1979    Family History  Problem Relation Age of Onset  . Coronary artery disease Mother 60       deceased  . Depression Mother   . Hyperlipidemia Mother   . Diabetes Paternal Grandmother   . Thyroid disease Sister     Social History   Socioeconomic History  . Marital status: Married    Spouse name: Not on file  . Number of children: Not on file  . Years of education: Not on file  . Highest education level: Not on file  Occupational History  .  Occupation: Retired Therapist, sports.   Social Needs  . Financial resource strain: Not on file  . Food insecurity    Worry: Not on file    Inability: Not on file  . Transportation needs    Medical: Not on file    Non-medical: Not on file  Tobacco Use  . Smoking status: Never Smoker  . Smokeless tobacco: Never Used  Substance and Sexual Activity  . Alcohol use: No  . Drug use: No  . Sexual activity: Not on file  Lifestyle  . Physical activity    Days per week: Not on file    Minutes per session: Not on file  . Stress: Not on file  Relationships  . Social Herbalist on phone: Not on file    Gets together: Not on file    Attends religious service: Not on file    Active member of club or organization: Not on file    Attends meetings of clubs or organizations: Not on file    Relationship status: Not on file  . Intimate partner violence    Fear of current or  ex partner: Not on file    Emotionally abused: Not on file    Physically abused: Not on file    Forced sexual activity: Not on file  Other Topics Concern  . Not on file  Social History Narrative  . Not on file    Outpatient Medications Prior to Visit  Medication Sig Dispense Refill  . acetaminophen (TYLENOL) 650 MG CR tablet Take 1 tablet (650 mg total) by mouth every 8 (eight) hours. 90 tablet 3  . albuterol (PROVENTIL HFA;VENTOLIN HFA) 108 (90 Base) MCG/ACT inhaler INHALE 2 PUFFS BY MOUTH EVERY 6 HOURS AS NEEDED FOR WHEEZING OR SHORTNESS OF BREATH 9 g 0  . atorvastatin (LIPITOR) 40 MG tablet Take 1 tablet (40 mg total) by mouth daily. 90 tablet 3  . clotrimazole-betamethasone (LOTRISONE) cream Apply 1 application topically 2 (two) times daily. 45 g 2  . diltiazem (CARDIZEM CD) 240 MG 24 hr capsule Take 1 capsule (240 mg total) by mouth daily. 90 capsule 3  . fluticasone (FLONASE) 50 MCG/ACT nasal spray USE 1 SPRAY IN EACH NOSTRIL TWO TIMES DAILY 48 g 3  . furosemide (LASIX) 20 MG tablet Take 1 tablet (20 mg total) by mouth daily as needed (SWELLING). 90 tablet 1  . ketoconazole (NIZORAL) 2 % cream Apply topically daily as needed for irritation. 60 g 3  . levothyroxine (SYNTHROID) 112 MCG tablet Take 1 tablet (112 mcg total) by mouth daily before breakfast. 90 tablet 3  . meloxicam (MOBIC) 15 MG tablet TAKE 1 TABLET BY MOUTH  DAILY 90 tablet 3  . metoprolol succinate (TOPROL-XL) 50 MG 24 hr tablet TAKE 1 TABLET BY MOUTH  EVERY DAY WITH OR  IMMEDIATELY FOLLOWING A  MEAL 90 tablet 2  . potassium chloride (K-DUR) 10 MEQ tablet Take 1 tablet (10 mEq total) by mouth daily as needed. 30 tablet 1  . traMADol (ULTRAM) 50 MG tablet Take 1 tablet by mouth three times daily as needed 270 tablet 0  . mupirocin ointment (BACTROBAN) 2 % APPLY TO AFFECTED AREA 1 2 TIMES A DAY     No facility-administered medications prior to visit.     Allergies  Allergen Reactions  . Demerol Nausea Only  .  Meperidine Hcl Other (See Comments)    Feels loopy  . Oxycodone-Aspirin Other (See Comments)    Feels loopy  . Percocet [  Oxycodone-Acetaminophen] Other (See Comments)    Feels loopy    ROS     Objective:    Physical Exam  Constitutional: She is oriented to person, place, and time. She appears well-developed and well-nourished.  HENT:  Head: Normocephalic and atraumatic.  Eyes: Conjunctivae and EOM are normal.  Cardiovascular: Normal rate.  Pulmonary/Chest: Effort normal.  Musculoskeletal:     Comments: Left ankle with chronic edema.  She has some thickening rough skin around the area she has 2 erythematous papules on the anterior ankle.  Dorsal pedal pulse 1+.  She is tender over the distal fifth metatarsal and tender over almost the entire fourth metatarsal.  She is nontender over the proximal head of the first metatarsal which is where she is complaining of most of her pain and discomfort and where she notices some occasional redness.  She has no redness on the foot today.  Neurological: She is alert and oriented to person, place, and time.  Skin: Skin is dry. No pallor.  Psychiatric: She has a normal mood and affect. Her behavior is normal.  Vitals reviewed.   BP 137/71   Pulse 65   Ht 5\' 2"  (1.575 m)   Wt 211 lb (95.7 kg)   SpO2 98%   BMI 38.59 kg/m  Wt Readings from Last 3 Encounters:  02/01/19 211 lb (95.7 kg)  12/29/18 211 lb (95.7 kg)  12/21/18 211 lb (95.7 kg)    There are no preventive care reminders to display for this patient.  There are no preventive care reminders to display for this patient.   Lab Results  Component Value Date   TSH 4.92 (H) 12/29/2018   Lab Results  Component Value Date   WBC 5.8 12/08/2016   HGB 13.5 12/08/2016   HCT 38.7 12/08/2016   MCV 91.5 12/08/2016   PLT 203 12/08/2016   Lab Results  Component Value Date   NA 140 09/20/2018   K 4.2 09/20/2018   CO2 27 09/20/2018   GLUCOSE 105 (H) 09/20/2018   BUN 16 09/20/2018    CREATININE 0.72 09/20/2018   BILITOT 1.0 09/20/2018   ALKPHOS 119 08/25/2016   AST 35 09/20/2018   ALT 53 (H) 09/20/2018   PROT 6.9 09/20/2018   ALBUMIN 4.4 08/25/2016   CALCIUM 9.4 09/20/2018   Lab Results  Component Value Date   CHOL 205 (H) 01/29/2018   Lab Results  Component Value Date   HDL 44 (L) 01/29/2018   Lab Results  Component Value Date   LDLCALC 123 (H) 01/29/2018   Lab Results  Component Value Date   TRIG 248 (H) 01/29/2018   Lab Results  Component Value Date   CHOLHDL 4.7 01/29/2018   Lab Results  Component Value Date   HGBA1C 5.5 12/29/2018       Assessment & Plan:   Problem List Items Addressed This Visit    None    Visit Diagnoses    Left foot pain    -  Primary   Relevant Orders   DG Foot Complete Left   BMI 38.0-38.9,adult         Left midfoot pain-most of her pain is over the proximal end of the first metatarsal.  But on exam she is actually mostly tender over the distal fourth and fifth metatarsals.  X-ray was negative for any type of fracture or dislocation.  Recommend exercises for posterior tibial tendinitis.  Patient prefers to do home exercises initially and then if not improving consider formal  PT.  We also discussed custom orthotics to help support the midfoot.  She would let us know if she is okay with referral for that.  BMI 38-discussed strategies around weight loss including meal planning for the week.  Really getting things like crackers and chips out of the house which she does tend to snack on.  We also discussed maybe even doing weight watchers or Noom.    No orders of the defined types were placed in this encounter.    Beatrice Lecher, MD

## 2019-02-07 ENCOUNTER — Other Ambulatory Visit: Payer: Self-pay

## 2019-02-07 ENCOUNTER — Telehealth: Payer: Self-pay | Admitting: Family Medicine

## 2019-02-07 DIAGNOSIS — N6452 Nipple discharge: Secondary | ICD-10-CM

## 2019-02-07 NOTE — Patient Outreach (Signed)
[facility]) Care Management  02/07/2019  Martha Perkins [birth year] [id number]   Medication Adherence call to Martha Perkins [facility] Identifiers Verify spoke with patient she is past due on Atorvastatin 40 mg,patient explain she takes 1 tablet daily,patient has medication at this time,patient explain at some point she end up with extras,reason why she has another month worth of medication. Martha Perkins is showing past due under [place].   [facility] Management Direct Dial [phone]  Fax [phone] [email or web address]

## 2019-02-07 NOTE — Telephone Encounter (Signed)
Patient called and left a message that she had went to get her MRI and was told that she would have to sit still for 40 minutes and she has bilateral shoulder pain as well that prohibits her from staying still that long. So she cancelled the appointment and did not get the MRI completed. She is wanting to know if you have any other recommendations. Please advise.

## 2019-02-09 NOTE — Telephone Encounter (Signed)
Left patient msg to call back to discuss options

## 2019-02-09 NOTE — Telephone Encounter (Signed)
Would it be helpful to give her something to help her relax such as the Xanax beforehand to make her a little relaxed and sleepy and that might help her be able to sit.  Unfortunately the MRI really is going to be the best test.  The other option is we can get her in with one of our sports med docs and have them do an evaluation to see what they would recommend.

## 2019-02-09 NOTE — Telephone Encounter (Signed)
Martha Perkins states the Breast MRI requires her to lay on her stomach for 40 minutes. She does want the test done but she states she will not be able to lay on her stomach for 40 minutes even with Xanax. She states she might be able to do it if she was sedated.

## 2019-02-09 NOTE — Telephone Encounter (Signed)
Okay, will have to call the breast center to troubleshoot this.  I am not sure what to recommend and I do not know what their sedation protocols are, that they might have in place.

## 2019-02-09 NOTE — Telephone Encounter (Signed)
[facility], they are unable to sedate there. Needs to be changed to [facility].   Jenny Reichmann, no Josem Kaufmann was needed since she has [employer].  Can you please change location and get her set up for this?

## 2019-02-10 ENCOUNTER — Other Ambulatory Visit: Payer: Medicare Other

## 2019-02-10 NOTE — Telephone Encounter (Signed)
I  Printed order out and will call and schedule patient with Zacarias Pontes. - CF

## 2019-02-10 NOTE — Addendum Note (Signed)
Addended by: Beatrice Lecher D on: 02/10/2019 10:19 AM   Modules accepted: Orders

## 2019-02-10 NOTE — Telephone Encounter (Signed)
New order placed. I think I did it correctly

## 2019-02-10 NOTE — Telephone Encounter (Signed)
Apolonio Schneiders    this is not a referral it is an order and I can not change these either you or the doctor will have to fix it to say [place]. - CF

## 2019-02-11 ENCOUNTER — Other Ambulatory Visit: Payer: Self-pay

## 2019-02-11 NOTE — Patient Outreach (Signed)
[facility]) Care Management  02/11/2019  Lucretia Chatten [birth year] [id number]   Medication Adherence call to Mrs. Blunt Compliant Voice message left with a call back number. Mrs. Spohr is showing past due on Atorvastatin 40 mg under [place].   [place] [facility] Management Direct Dial [phone]  Fax [phone] [email or web address]

## 2019-02-21 ENCOUNTER — Other Ambulatory Visit: Payer: Self-pay | Admitting: Family Medicine

## 2019-02-23 ENCOUNTER — Telehealth: Payer: Self-pay

## 2019-02-23 NOTE — Telephone Encounter (Signed)
FYI: Pt was scheduled for a breast biopsy on 03/08/2019 and she was told that she was going to be sedated for the procedure. Pt was unsure about how they were going to sedate her and was worried if she was going to be intubated for the procedure. She called the central surgery scheduling and they told her that they did not know for sure how she would be sedated. Pt states that because of having to be sedated, because it was going to be done at the hospital, and because of COVID, she is going to cancel the biopsy and reschedule it for next year, after she has had the COVID vaccine.

## 2019-02-24 NOTE — Telephone Encounter (Signed)
Okay, I am confused I think we need some clarification.  I had written and completed orders for her to have sedation for an MRI not a biopsy.  She already have the MRI done?  And now they are recommending biopsy.  The sedation for an MRI would not be intubation.  It would just be IV medication to put her to sleep.  But if she is truly talking about a biopsy then I would need to get additional information for what location and what surgeon etc.

## 2019-02-24 NOTE — Telephone Encounter (Signed)
Called patient. She states that she never mentioned biopsy, she only said MRI.  Patient is worried with sedation that she may have some kind of respiratory issues and need to be intubated. Per patient, she feels her chances of catching COVID at the hospital during MRI are higher than her having breast cancer. Patient has cancelled MRI and wants to wait to have it done after she has been vaccinated.   Patient has [facility] Medicare, no auth was required for imaging so she can reschedule at her convenience. I advised patient to call centralized scheduling when she is ready to reschedule.   FYI to PCP

## 2019-02-26 ENCOUNTER — Other Ambulatory Visit: Payer: Self-pay | Admitting: Family Medicine

## 2019-03-05 ENCOUNTER — Ambulatory Visit (HOSPITAL_COMMUNITY): Payer: Medicare Other

## 2019-03-08 ENCOUNTER — Ambulatory Visit (HOSPITAL_COMMUNITY): Payer: Medicare Other

## 2019-03-08 ENCOUNTER — Ambulatory Visit: Admit: 2019-03-08 | Payer: Medicare Other

## 2019-03-08 ENCOUNTER — Encounter (HOSPITAL_COMMUNITY): Payer: Self-pay

## 2019-03-08 SURGERY — MRI WITH ANESTHESIA
Anesthesia: General | Laterality: Bilateral

## 2019-03-17 ENCOUNTER — Encounter: Payer: Self-pay | Admitting: Family Medicine

## 2019-03-17 ENCOUNTER — Ambulatory Visit (INDEPENDENT_AMBULATORY_CARE_PROVIDER_SITE_OTHER): Payer: Medicare Other

## 2019-03-17 ENCOUNTER — Other Ambulatory Visit: Payer: Self-pay

## 2019-03-17 ENCOUNTER — Ambulatory Visit (INDEPENDENT_AMBULATORY_CARE_PROVIDER_SITE_OTHER): Payer: Medicare Other | Admitting: Family Medicine

## 2019-03-17 VITALS — BP 133/59 | HR 60 | Ht 62.0 in | Wt 213.0 lb

## 2019-03-17 DIAGNOSIS — E8881 Metabolic syndrome: Secondary | ICD-10-CM

## 2019-03-17 DIAGNOSIS — J984 Other disorders of lung: Secondary | ICD-10-CM | POA: Diagnosis not present

## 2019-03-17 DIAGNOSIS — M25562 Pain in left knee: Secondary | ICD-10-CM | POA: Diagnosis not present

## 2019-03-17 DIAGNOSIS — J4521 Mild intermittent asthma with (acute) exacerbation: Secondary | ICD-10-CM | POA: Diagnosis not present

## 2019-03-17 DIAGNOSIS — I10 Essential (primary) hypertension: Secondary | ICD-10-CM

## 2019-03-17 DIAGNOSIS — M7989 Other specified soft tissue disorders: Secondary | ICD-10-CM | POA: Diagnosis not present

## 2019-03-17 DIAGNOSIS — E88819 Insulin resistance, unspecified: Secondary | ICD-10-CM

## 2019-03-17 LAB — POCT GLYCOSYLATED HEMOGLOBIN (HGB A1C): Hemoglobin A1C: 5.6 % (ref 4.0–5.6)

## 2019-03-17 MED ORDER — ALBUTEROL SULFATE HFA 108 (90 BASE) MCG/ACT IN AERS: INHALATION_SPRAY | RESPIRATORY_TRACT | 0 refills | Status: DC

## 2019-03-17 MED ORDER — FLOVENT HFA 110 MCG/ACT IN AERO: 2.0000 | INHALATION_SPRAY | Freq: Two times a day (BID) | RESPIRATORY_TRACT | 2 refills | Status: DC

## 2019-03-17 NOTE — Progress Notes (Signed)
Established Patient Office Visit  Subjective:  Patient ID: Martha Perkins, female    DOB: Aug 09, 1945  Age: 74 y.o. MRN: [id number]  CC:  Chief Complaint  Patient presents with  . Edema    HPI Martha Perkins presents for   Hypertension- Pt denies chest pain, SOB, dizziness, or heart palpitations.  Taking meds as directed w/o problems.  Denies medication side effects.      F/U Asthma -she is been having to use her inhaler more frequently in fact she has been using it almost every night and occasionally when she first gets up in the morning.  It does seem to help when she uses it.  She is not currently on a controller.  She did speak with one of the local pharmacies where she is at and they did recommend getting the generic Ventolin to save her money as she was previously pain $45 for it.  Lower extremity swelling has gotten worse over the holidays.  In fact right after Christmas it got so bad that she was weeping from multiple places on her legs.  She is had difficulty getting the compression stockings on so really has not been wearing them.  But she actually did go to DME supplier yesterday and got a new device that might help her be able to get them on more effectively.did take her lasix and potassium and helpsed some. Has been using it more often.    She is very concerned because she has had some pain in both knees and she has had replacements in both knees.  She says she is more concerned about the left knee because it is actually been swollen for the last 2 weeks it is a little bit more medial.  She denies any recent injury or trauma.  She did fall and injure that knee sometime back after her knee replacement.  Past Medical History:  Diagnosis Date  . Depression   . Hyperlipidemia   . Hypertension   . Hyperthyroidism 08/09/2015  . Kidney stone   . Paroxysmal atrial fibrillation ([place]) 08/09/2015  . Thyroid disease     Past Surgical History:  Procedure Laterality Date  . CARDIOVERSION  N/A 08/05/2016   Procedure: CARDIOVERSION;  Surgeon: Skeet Latch, MD;  Location: [facility];  Service: Cardiovascular;  Laterality: N/A;  . CATARACT EXTRACTION Left 09/2012   Dr. Bunnie Philips  . LITHOTRIPSY    . REPLACEMENT TOTAL KNEE BILATERAL  2011  . THYROIDECTOMY  1971  . TUBAL LIGATION  1979    Family History  Problem Relation Age of Onset  . Coronary artery disease Mother 58       deceased  . Depression Mother   . Hyperlipidemia Mother   . Diabetes Paternal Grandmother   . Thyroid disease Sister     Social History   Socioeconomic History  . Marital status: Married    Spouse name: Not on file  . Number of children: Not on file  . Years of education: Not on file  . Highest education level: Not on file  Occupational History  . Occupation: Retired Therapist, sports.   Tobacco Use  . Smoking status: Never Smoker  . Smokeless tobacco: Never Used  Substance and Sexual Activity  . Alcohol use: No  . Drug use: No  . Sexual activity: Not on file  Other Topics Concern  . Not on file  Social History Narrative  . Not on file   Social Determinants of Health   Financial Resource Strain:   .  Difficulty of Paying Living Expenses: Not on file  Food Insecurity:   . Worried About Charity fundraiser in the Last Year: Not on file  . Ran Out of Food in the Last Year: Not on file  Transportation Needs:   . Lack of Transportation (Medical): Not on file  . Lack of Transportation (Non-Medical): Not on file  Physical Activity:   . Days of Exercise per Week: Not on file  . Minutes of Exercise per Session: Not on file  Stress:   . Feeling of Stress : Not on file  Social Connections:   . Frequency of Communication with Friends and Family: Not on file  . Frequency of Social Gatherings with Friends and Family: Not on file  . Attends Religious Services: Not on file  . Active Member of Clubs or Organizations: Not on file  . Attends Archivist Meetings: Not on file  . Marital  Status: Not on file  Intimate Partner Violence:   . Fear of Current or Ex-Partner: Not on file  . Emotionally Abused: Not on file  . Physically Abused: Not on file  . Sexually Abused: Not on file    Outpatient Medications Prior to Visit  Medication Sig Dispense Refill  . traMADol (ULTRAM) 50 MG tablet Take 1 tablet by mouth three times daily as needed 270 tablet 0  . acetaminophen (TYLENOL) 650 MG CR tablet Take 1 tablet (650 mg total) by mouth every 8 (eight) hours. 90 tablet 3  . atorvastatin (LIPITOR) 40 MG tablet Take 1 tablet (40 mg total) by mouth daily. 90 tablet 3  . clotrimazole-betamethasone (LOTRISONE) cream Apply 1 application topically 2 (two) times daily. 45 g 2  . diltiazem (CARDIZEM CD) 240 MG 24 hr capsule Take 1 capsule (240 mg total) by mouth daily. 90 capsule 3  . fluticasone (FLONASE) 50 MCG/ACT nasal spray USE 1 SPRAY IN EACH NOSTRIL TWO TIMES DAILY 48 g 3  . furosemide (LASIX) 20 MG tablet Take 1 tablet (20 mg total) by mouth daily as needed (SWELLING). 90 tablet 1  . ketoconazole (NIZORAL) 2 % cream Apply topically daily as needed for irritation. 60 g 3  . levothyroxine (SYNTHROID) 112 MCG tablet Take 1 tablet (112 mcg total) by mouth daily before breakfast. 90 tablet 3  . meloxicam (MOBIC) 15 MG tablet TAKE 1 TABLET BY MOUTH  DAILY 90 tablet 3  . metoprolol succinate (TOPROL-XL) 50 MG 24 hr tablet TAKE 1 TABLET BY MOUTH  EVERY DAY WITH OR  IMMEDIATELY FOLLOWING A  MEAL 90 tablet 2  . potassium chloride (K-DUR) 10 MEQ tablet Take 1 tablet (10 mEq total) by mouth daily as needed. 30 tablet 1  . albuterol (PROVENTIL HFA;VENTOLIN HFA) 108 (90 Base) MCG/ACT inhaler INHALE 2 PUFFS BY MOUTH EVERY 6 HOURS AS NEEDED FOR WHEEZING OR SHORTNESS OF BREATH 9 g 0   No facility-administered medications prior to visit.    Allergies  Allergen Reactions  . Demerol Nausea Only  . Meperidine Hcl Other (See Comments)    Feels loopy  . Oxycodone-Aspirin Other (See Comments)     Feels loopy  . Percocet [Oxycodone-Acetaminophen] Other (See Comments)    Feels loopy    ROS Review of Systems    Objective:    Physical Exam  Constitutional: She is oriented to person, place, and time. She appears well-developed and well-nourished.  HENT:  Head: Normocephalic and atraumatic.  Cardiovascular: Normal rate, regular rhythm and normal heart sounds.  Pulmonary/Chest: Effort normal and  breath sounds normal.  Musculoskeletal:     Comments: 1_ pitting edema bilaterally but worse on the left.  Some mild dry scaling skin.  Some thickening of the skin on her left shin.some medial swelling over the left knee.    Neurological: She is alert and oriented to person, place, and time.  Skin: Skin is warm and dry.  Psychiatric: She has a normal mood and affect. Her behavior is normal.    BP (!) 133/59   Pulse 60   Ht 5\' 2"  (1.575 m)   Wt 213 lb (96.6 kg)   SpO2 99%   BMI 38.96 kg/m  Wt Readings from Last 3 Encounters:  03/17/19 213 lb (96.6 kg)  02/01/19 211 lb (95.7 kg)  12/29/18 211 lb (95.7 kg)     There are no preventive care reminders to display for this patient.  There are no preventive care reminders to display for this patient.  Lab Results  Component Value Date   TSH 4.92 (H) 12/29/2018   Lab Results  Component Value Date   WBC 5.8 12/08/2016   HGB 13.5 12/08/2016   HCT 38.7 12/08/2016   MCV 91.5 12/08/2016   PLT 203 12/08/2016   Lab Results  Component Value Date   NA 142 03/17/2019   K 4.2 03/17/2019   CO2 27 03/17/2019   GLUCOSE 95 03/17/2019   BUN 17 03/17/2019   CREATININE 0.76 03/17/2019   BILITOT 1.0 09/20/2018   ALKPHOS 119 08/25/2016   AST 35 09/20/2018   ALT 53 (H) 09/20/2018   PROT 6.9 09/20/2018   ALBUMIN 4.4 08/25/2016   CALCIUM 9.7 03/17/2019   Lab Results  Component Value Date   CHOL 205 (H) 01/29/2018   Lab Results  Component Value Date   HDL 44 (L) 01/29/2018   Lab Results  Component Value Date   LDLCALC 123 (H)  01/29/2018   Lab Results  Component Value Date   TRIG 248 (H) 01/29/2018   Lab Results  Component Value Date   CHOLHDL 4.7 01/29/2018   Lab Results  Component Value Date   HGBA1C 5.6 03/17/2019      Assessment & Plan:   Problem List Items Addressed This Visit      Cardiovascular and Mediastinum   Hypertension    Well controlled. Continue current regimen. Follow up in  6 mo. Check BMP        Respiratory   Restrictive lung disease    She has been using her albuterol more frequently.  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 Will try adding inhaled  Corticosteroid controller. Start Flovent daily x 2 mo        Endocrine   Insulin resistance    Well controlled. Continue current regimen. Follow up in  6 mo.   Lab Results  Component Value Date   HGBA1C 5.6 03/17/2019         Relevant Orders   POCT HgB A1C (Completed)     Other   Localized swelling of both lower extremities - Primary    Recommend use the lasix daily for the next few days. Try to wear the compression stocking. Elevated feet for 15-20 TID. Work on low salt diet and weight loss.       Relevant Orders   BMP w/ GFR (Completed)    Other Visit Diagnoses    Acute pain of left knee       Relevant Orders   DG Knee Complete 4 Views Left (Completed)   Mild  intermittent reactive airway disease with acute exacerbation       Relevant Medications   albuterol (VENTOLIN HFA) 108 (90 Base) MCG/ACT inhaler   fluticasone (FLOVENT HFA) 110 MCG/ACT inhaler     Left knee pain - will get xray to rule out any problems with the hardware. If normal consider further evaluation with Dr. Darene Lamer.    Meds ordered this encounter  Medications  . albuterol (VENTOLIN HFA) 108 (90 Base) MCG/ACT inhaler    Sig: INHALE 2 PUFFS BY MOUTH EVERY 6 HOURS AS NEEDED FOR WHEEZING OR SHORTNESS OF BREATH    Dispense:  9 g    Refill:  0    Generic please  . fluticasone (FLOVENT HFA) 110 MCG/ACT inhaler    Sig: Inhale 2 puffs into the lungs 2  (two) times daily.    Dispense:  1 Inhaler    Refill:  2    Follow-up: Return if symptoms worsen or fail to improve.    Beatrice Lecher, MD

## 2019-03-18 ENCOUNTER — Encounter: Payer: Self-pay | Admitting: Family Medicine

## 2019-03-18 LAB — BASIC METABOLIC PANEL WITH GFR
BUN: 17 mg/dL (ref 7–25)
CO2: 27 mmol/L (ref 20–32)
Calcium: 9.7 mg/dL (ref 8.6–10.4)
Chloride: 106 mmol/L (ref 98–110)
Creat: 0.76 mg/dL (ref 0.60–0.93)
GFR, Est African American: 91 mL/min/{1.73_m2} (ref >=60)
GFR, Est Non African American: 79 mL/min/{1.73_m2} (ref >=60)
Glucose, Bld: 95 mg/dL (ref 65–99)
Potassium: 4.2 mmol/L (ref 3.5–5.3)
Sodium: 142 mmol/L (ref 135–146)

## 2019-03-18 NOTE — Assessment & Plan Note (Addendum)
She has been using her albuterol more frequently.  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 Will try adding inhaled  Corticosteroid controller. Start Flovent daily x 2 mo

## 2019-03-18 NOTE — Assessment & Plan Note (Signed)
Well controlled. Continue current regimen. Follow up in  6 mo.   Lab Results  Component Value Date   HGBA1C 5.6 03/17/2019

## 2019-03-18 NOTE — Progress Notes (Signed)
All labs are normal. 

## 2019-03-18 NOTE — Assessment & Plan Note (Signed)
Well controlled. Continue current regimen. Follow up in  6 mo. Check BMP

## 2019-03-18 NOTE — Assessment & Plan Note (Signed)
Recommend use the lasix daily for the next few days. Try to wear the compression stocking. Elevated feet for 15-20 TID. Work on low salt diet and weight loss.

## 2019-04-12 ENCOUNTER — Encounter: Payer: Self-pay | Admitting: *Deleted

## 2019-04-15 ENCOUNTER — Ambulatory Visit: Payer: Medicare Other | Admitting: Cardiovascular Disease

## 2019-04-19 ENCOUNTER — Ambulatory Visit (INDEPENDENT_AMBULATORY_CARE_PROVIDER_SITE_OTHER): Payer: Medicare Other | Admitting: *Deleted

## 2019-04-19 VITALS — BP 136/86 | HR 64 | Ht 62.0 in | Wt 213.0 lb

## 2019-04-19 DIAGNOSIS — Z0000 Encounter for general adult medical examination without abnormal findings: Secondary | ICD-10-CM | POA: Diagnosis not present

## 2019-04-19 NOTE — Progress Notes (Signed)
Subjective:   Martha Perkins is a 74 y.o. female who presents for Medicare Annual (Subsequent) preventive examination.  Review of Systems:  No ROS.  Medicare Wellness Virtual Visit.  Visual/audio telehealth visit, UTA vital signs.   See social history for additional risk factors.    Cardiac Risk Factors include: advanced age (>44men, >79 women);hypertension;sedentary lifestyle Sleep patterns: Getting 5-6 hours of sleep a night. Wakes up 1 time a night to void. Wakes up and feels sluggish   Home Safety/Smoke Alarms: Feels safe in home. Smoke alarms in place.  Living environment;Lives with husband in 1 story home and no stairs in the home. Shower is a walk in shower and no grab bars in place. Bench is in place. Seat Belt Safety/Bike Helmet: Wears seat belt.   Female:   Pap- Aged out      Mammo- UTD      Dexa scan- declined       CCS- UTD     Objective:     Vitals: BP 136/86   Pulse 64   Ht 5\' 2"  (1.575 m)   Wt 213 lb (96.6 kg)   BMI 38.96 kg/m   Body mass index is 38.96 kg/m.  Advanced Directives 04/19/2019 01/15/2017 09/02/2016 08/05/2016 12/14/2015 07/03/2014  Does Patient Have a Medical Advance Directive? No No No No No No  Would patient like information on creating a medical advance directive? No - Patient declined No - Patient declined - No - Patient declined No - patient declined information No - patient declined information    Tobacco Social History   Tobacco Use  Smoking Status Never Smoker  Smokeless Tobacco Never Used     Counseling given: No   Clinical Intake:  Pre-visit preparation completed: Yes  Pain : No/denies pain     Nutritional Risks: None Diabetes: No  How often do you need to have someone help you when you read instructions, pamphlets, or other written materials from your doctor or pharmacy?: 1 - Never What is the last grade level you completed in school?: 16  Interpreter Needed?: No  Information entered by :: Orlie Dakin, LPN  Past  Medical History:  Diagnosis Date  . Depression   . Hyperlipidemia   . Hypertension   . Hyperthyroidism 08/09/2015  . Kidney stone   . Paroxysmal atrial fibrillation ([place]) 08/09/2015  . Thyroid disease    Past Surgical History:  Procedure Laterality Date  . CARDIOVERSION N/A 08/05/2016   Procedure: CARDIOVERSION;  Surgeon: Skeet Latch, MD;  Location: [facility];  Service: Cardiovascular;  Laterality: N/A;  . CATARACT EXTRACTION Left 09/2012   Dr. Bunnie Philips  . LITHOTRIPSY    . REPLACEMENT TOTAL KNEE BILATERAL  2011  . THYROIDECTOMY  1971  . TUBAL LIGATION  1979   Family History  Problem Relation Age of Onset  . Coronary artery disease Mother 27       deceased  . Depression Mother   . Hyperlipidemia Mother   . Diabetes Paternal Grandmother   . Thyroid disease Sister   . COPD Father    Social History   Socioeconomic History  . Marital status: Married    Spouse name: Remo Lipps  . Number of children: 2  . Years of education: 16  . Highest education level: Bachelor's degree (e.g., BA, AB, BS)  Occupational History  . Occupation: Retired Therapist, sports.   Tobacco Use  . Smoking status: Never Smoker  . Smokeless tobacco: Never Used  Substance and Sexual Activity  . Alcohol use:  No  . Drug use: No  . Sexual activity: Not Currently  Other Topics Concern  . Not on file  Social History Narrative   Stay around the house. Go to grocery store.    Social Determinants of Health   Financial Resource Strain:   . Difficulty of Paying Living Expenses: Not on file  Food Insecurity:   . Worried About Charity fundraiser in the Last Year: Not on file  . Ran Out of Food in the Last Year: Not on file  Transportation Needs:   . Lack of Transportation (Medical): Not on file  . Lack of Transportation (Non-Medical): Not on file  Physical Activity:   . Days of Exercise per Week: Not on file  . Minutes of Exercise per Session: Not on file  Stress:   . Feeling of Stress : Not on file  Social  Connections:   . Frequency of Communication with Friends and Family: Not on file  . Frequency of Social Gatherings with Friends and Family: Not on file  . Attends Religious Services: Not on file  . Active Member of Clubs or Organizations: Not on file  . Attends Archivist Meetings: Not on file  . Marital Status: Not on file    Outpatient Encounter Medications as of 04/19/2019  Medication Sig  . acetaminophen (TYLENOL) 650 MG CR tablet Take 1 tablet (650 mg total) by mouth every 8 (eight) hours.  Marland Kitchen albuterol (VENTOLIN HFA) 108 (90 Base) MCG/ACT inhaler INHALE 2 PUFFS BY MOUTH EVERY 6 HOURS AS NEEDED FOR WHEEZING OR SHORTNESS OF BREATH  . atorvastatin (LIPITOR) 40 MG tablet Take 1 tablet (40 mg total) by mouth daily.  . clotrimazole-betamethasone (LOTRISONE) cream Apply 1 application topically 2 (two) times daily.  Marland Kitchen diltiazem (CARDIZEM CD) 240 MG 24 hr capsule Take 1 capsule (240 mg total) by mouth daily.  . fluticasone (FLONASE) 50 MCG/ACT nasal spray USE 1 SPRAY IN EACH NOSTRIL TWO TIMES DAILY  . fluticasone (FLOVENT HFA) 110 MCG/ACT inhaler Inhale 2 puffs into the lungs 2 (two) times daily.  . furosemide (LASIX) 20 MG tablet Take 1 tablet (20 mg total) by mouth daily as needed (SWELLING).  Marland Kitchen ketoconazole (NIZORAL) 2 % cream Apply topically daily as needed for irritation.  Marland Kitchen levothyroxine (SYNTHROID) 112 MCG tablet Take 1 tablet (112 mcg total) by mouth daily before breakfast.  . meloxicam (MOBIC) 15 MG tablet TAKE 1 TABLET BY MOUTH  DAILY  . metoprolol succinate (TOPROL-XL) 50 MG 24 hr tablet TAKE 1 TABLET BY MOUTH  EVERY DAY WITH OR  IMMEDIATELY FOLLOWING A  MEAL  . potassium chloride (K-DUR) 10 MEQ tablet Take 1 tablet (10 mEq total) by mouth daily as needed.  . traMADol (ULTRAM) 50 MG tablet Take 1 tablet by mouth three times daily as needed   No facility-administered encounter medications on file as of 04/19/2019.    Activities of Daily Living In your present state of  health, do you have any difficulty performing the following activities: 04/19/2019  Hearing? N  Vision? N  Difficulty concentrating or making decisions? Y  Comment difficulty remembering and concentrating  Walking or climbing stairs? N  Dressing or bathing? N  Doing errands, shopping? N  Preparing Food and eating ? N  Using the Toilet? N  In the past six months, have you accidently leaked urine? Y  Comment when coughs, sneezes  Do you have problems with loss of bowel control? N  Comment chronic constipation  Managing your Medications?  N  Managing your Finances? N  Housekeeping or managing your Housekeeping? N  Some recent data might be hidden    Patient Care Team: Hali Marry, MD as PCP - General (Family Medicine) Renato Shin, MD as Consulting Physician (Endocrinology)    Assessment:   This is a routine wellness examination for Christene.Physical assessment deferred to PCP.   Exercise Activities and Dietary recommendations Current Exercise Habits: The patient does not participate in regular exercise at present, Exercise limited by: orthopedic condition(s)(shoulder and knee pain) Diet  Eats a fairly healthy diet of vegetables, fruits and proteins. Breakfast: Eggs, toast Lunch: skips Dinner: Meat and vegetables, beans Drinks coffee and diet soda. Encouraged to drink more water.      Goals    . Weight (lb) < 200 lb (90.7 kg)     Patient states would like to loose weight 25lbs       Fall Risk Fall Risk  04/19/2019 12/29/2018 07/20/2017 07/16/2017 01/15/2017  Falls in the past year? 0 0 Yes No No  Number falls in past yr: - 0 1 - -  Injury with Fall? - 0 Yes - -  Risk for fall due to : No Fall Risks - Other (Comment) - -  Risk for fall due to: Comment - - pt had her hands full, unfamilar surroundings - -  Follow up Falls prevention discussed - Falls prevention discussed - -   Is the patient's home free of loose throw rugs in walkways, pet beds, electrical cords, etc?    yes      Grab bars in the bathroom? no      Handrails on the stairs?   yes      Adequate lighting?   yes   Depression Screen PHQ 2/9 Scores 04/19/2019 12/29/2018 08/03/2018 03/15/2018  PHQ - 2 Score 0 0 0 0  PHQ- 9 Score - - - -     Cognitive Function     6CIT Screen 04/19/2019 01/15/2017 01/11/2016  What Year? 0 points 0 points 0 points  What month? 0 points 0 points 0 points  What time? 0 points 0 points 0 points  Count back from 20 0 points 0 points 0 points  Months in reverse 0 points 0 points 0 points  Repeat phrase 0 points 2 points 0 points  Total Score 0 2 0    Immunization History  Administered Date(s) Administered  . Fluad Quad(high Dose 65+) 12/29/2018  . Influenza Split 01/27/2012, 01/01/2017  . Influenza, High Dose Seasonal PF 01/29/2018  . Influenza,inj,Quad PF,6+ Mos 11/26/2012, 12/29/2013, 12/14/2014, 11/13/2015  . PPD Test 05/18/2018  . Pneumococcal Conjugate-13 12/29/2013  . Pneumococcal Polysaccharide-23 11/26/2012  . Tdap 07/02/2015    Screening Tests Health Maintenance  Topic Date Due  . MAMMOGRAM  11/07/2020  . COLONOSCOPY  02/19/2025  . TETANUS/TDAP  07/01/2025  . INFLUENZA VACCINE  Completed  . DEXA SCAN  Completed  . Hepatitis C Screening  Completed  . PNA vac Low Risk Adult  Completed       Plan:    Please schedule your next medicare wellness visit with me in 1 yr.  Ms. Garrette , Thank you for taking time to come for your Medicare Wellness Visit. I appreciate your ongoing commitment to your health goals. Please review the following plan we discussed and let me know if I can assist you in the future.   These are the goals we discussed: Goals    . Weight (lb) < 200 lb (90.7  kg)     Patient states would like to loose weight 25lbs       This is a list of the screening recommended for you and due dates:  Health Maintenance  Topic Date Due  . Mammogram  11/07/2020  . Colon Cancer Screening  02/19/2025  . Tetanus Vaccine  07/01/2025  . Flu  Shot  Completed  . DEXA scan (bone density measurement)  Completed  .  Hepatitis C: One time screening is recommended by Center for Disease Control  (CDC) for  adults born from [birth year] through 1965.   Completed  . Pneumonia vaccines  Completed      I have personally reviewed and noted the following in the patient's chart:   . Medical and social history . Use of alcohol, tobacco or illicit drugs  . Current medications and supplements . Functional ability and status . Nutritional status . Physical activity . Advanced directives . List of other physicians . Hospitalizations, surgeries, and ER visits in previous 12 months . Vitals . Screenings to include cognitive, depression, and falls . Referrals and appointments  In addition, I have reviewed and discussed with patient certain preventive protocols, quality metrics, and best practice recommendations. A written personalized care plan for preventive services as well as general preventive health recommendations were provided to patient.     Joanne Chars, LPN  [phone]

## 2019-04-19 NOTE — Patient Instructions (Addendum)
Please schedule your next medicare wellness visit with me in 1 yr.  Martha Perkins , Thank you for taking time to come for your Medicare Wellness Visit. I appreciate your ongoing commitment to your health goals. Please review the following plan we discussed and let me know if I can assist you in the future. These are the goals we discussed: Goals    . Weight (lb) < 200 lb (90.7 kg)     Patient states would like to loose weight 25lbs

## 2019-05-23 ENCOUNTER — Ambulatory Visit: Payer: Medicare Other | Admitting: Physician Assistant

## 2019-05-23 NOTE — Progress Notes (Deleted)
   Follow-Up Visit   Subjective  Martha Perkins is a 74 y.o. female who presents for the following: No chief complaint on file..  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  {Review of Systems:34166::"No other skin or systemic complaints."}  Objective  Well appearing patient in no apparent distress; mood and affect are within normal limits.  [id number] full examination was performed including scalp, head, eyes, ears, nose, lips, neck, chest, axillae, abdomen, back, buttocks, bilateral upper extremities, bilateral lower extremities, hands, feet, fingers, toes, fingernails, and toenails. All findings within normal limits unless otherwise noted below."}  No skin findings found.  Assessment & Plan

## 2019-06-03 ENCOUNTER — Ambulatory Visit: Payer: Medicare Other | Admitting: Cardiovascular Disease

## 2019-06-03 ENCOUNTER — Encounter: Payer: Self-pay | Admitting: Cardiovascular Disease

## 2019-06-03 ENCOUNTER — Other Ambulatory Visit: Payer: Self-pay

## 2019-06-03 VITALS — BP 114/70 | HR 65 | Ht 62.0 in | Wt 213.0 lb

## 2019-06-03 DIAGNOSIS — M7989 Other specified soft tissue disorders: Secondary | ICD-10-CM | POA: Diagnosis not present

## 2019-06-03 DIAGNOSIS — Z8679 Personal history of other diseases of the circulatory system: Secondary | ICD-10-CM | POA: Diagnosis not present

## 2019-06-03 DIAGNOSIS — I10 Essential (primary) hypertension: Secondary | ICD-10-CM | POA: Diagnosis not present

## 2019-06-03 DIAGNOSIS — Z5181 Encounter for therapeutic drug level monitoring: Secondary | ICD-10-CM | POA: Diagnosis not present

## 2019-06-03 DIAGNOSIS — I480 Paroxysmal atrial fibrillation: Secondary | ICD-10-CM

## 2019-06-03 DIAGNOSIS — E785 Hyperlipidemia, unspecified: Secondary | ICD-10-CM

## 2019-06-03 MED ORDER — FUROSEMIDE 20 MG PO TABS: 20.0000 mg | ORAL_TABLET | Freq: Every day | ORAL | 3 refills | Status: DC

## 2019-06-03 NOTE — Progress Notes (Signed)
Cardiology Office Note   Date:  06/04/2019   ID:  Martha Perkins, DOB 02-16-1946, MRN [id number]  PCP:  Hali Marry, MD  Cardiologist:   Skeet Latch, MD   No chief complaint on file.    History of Present Illness: Martha Perkins is a 74 y.o. female with paroxysmal atrial fibrillation, Grave's disease, hypertension, white coat hypertension, hyperlipidemia, and fatty liver disease who presents follow up on atrial fibrillation.  Martha Perkins was seen by her PCP, [name], on 07/13/15 for newly-diagnosed atrial fibrillation.  She was started on Eliquis and a low-dose beta blocker. Lab work revealed that she was hyperthyroid.  She had a thyroid uptake scan that revealed an enlarged, hyperfunctioning left lobe of the thyroid gland without discrete nodule. Findings were thought to be consistent with Graves' disease. She also underwent radioactive iodine therapy. She was seen in clinic 08/09/15 and metoprolol was increased.  Lisinopril was discontinued. She had an echo 08/2015 that revealed LVEF 50-55% with moderate left atrial enlargement, mild MR and TR.  Martha Perkins underwent cardioversion on 08/05/16.    Since her last appointment Martha Perkins has been doing well.  She has noticed a few palpitations recently.  The episodes occur 2 or 3 times per week and last for couple seconds at a time.  There hasn't been any sustained arrhythmia.  She denies any chest pain.  She was started on Flovent inhaler because she was having to use her albuterol more frequently.  She thinks that this is helping her breathing has improved.  She is not getting much exercise lately with plans to start walking more now that the weather is improving.  She does have some lower extremity edema that seems to be worse by the end of the day.  She denies orthopnea or PND.  Currently she uses her Lasix very rarely.  She has not been able to wear compression stockings.    Past Medical History:  Diagnosis Date  .  Depression   . Hyperlipidemia   . Hypertension   . Hyperthyroidism 08/09/2015  . Kidney stone   . Paroxysmal atrial fibrillation ([place]) 08/09/2015  . Thyroid disease     Past Surgical History:  Procedure Laterality Date  . CARDIOVERSION N/A 08/05/2016   Procedure: CARDIOVERSION;  Surgeon: Skeet Latch, MD;  Location: [facility];  Service: Cardiovascular;  Laterality: N/A;  . CATARACT EXTRACTION Left 09/2012   Dr. Bunnie Philips  . LITHOTRIPSY    . REPLACEMENT TOTAL KNEE BILATERAL  2011  . THYROIDECTOMY  1971  . TUBAL LIGATION  1979     Current Outpatient Medications  Medication Sig Dispense Refill  . acetaminophen (TYLENOL) 650 MG CR tablet Take 1 tablet (650 mg total) by mouth every 8 (eight) hours. 90 tablet 3  . albuterol (VENTOLIN HFA) 108 (90 Base) MCG/ACT inhaler INHALE 2 PUFFS BY MOUTH EVERY 6 HOURS AS NEEDED FOR WHEEZING OR SHORTNESS OF BREATH 9 g 0  . atorvastatin (LIPITOR) 40 MG tablet Take 1 tablet (40 mg total) by mouth daily. 90 tablet 3  . clotrimazole-betamethasone (LOTRISONE) cream Apply 1 application topically 2 (two) times daily. 45 g 2  . diltiazem (CARDIZEM CD) 240 MG 24 hr capsule Take 1 capsule (240 mg total) by mouth daily. 90 capsule 3  . fluticasone (FLONASE) 50 MCG/ACT nasal spray USE 1 SPRAY IN EACH NOSTRIL TWO TIMES DAILY 48 g 3  . fluticasone (FLOVENT HFA) 110 MCG/ACT inhaler Inhale 2 puffs into the lungs 2 (two) times  daily. 1 Inhaler 2  . furosemide (LASIX) 20 MG tablet Take 1 tablet (20 mg total) by mouth daily. 90 tablet 3  . ketoconazole (NIZORAL) 2 % cream Apply topically daily as needed for irritation. 60 g 3  . levothyroxine (SYNTHROID) 112 MCG tablet Take 1 tablet (112 mcg total) by mouth daily before breakfast. 90 tablet 3  . meloxicam (MOBIC) 15 MG tablet TAKE 1 TABLET BY MOUTH  DAILY 90 tablet 3  . metoprolol succinate (TOPROL-XL) 50 MG 24 hr tablet TAKE 1 TABLET BY MOUTH  EVERY DAY WITH OR  IMMEDIATELY FOLLOWING A  MEAL 90 tablet 2  .  potassium chloride (K-DUR) 10 MEQ tablet Take 1 tablet (10 mEq total) by mouth daily as needed. 30 tablet 1  . traMADol (ULTRAM) 50 MG tablet Take 1 tablet by mouth three times daily as needed 270 tablet 0   No current facility-administered medications for this visit.    Allergies:   Demerol, Meperidine hcl, Oxycodone-aspirin, and Percocet [oxycodone-acetaminophen]    Social History:  The patient  reports that she has never smoked. She has never used smokeless tobacco. She reports that she does not drink alcohol or use drugs.   Family History:  The patient's family history includes COPD in her father; Coronary artery disease (age of onset: 56) in her mother; Depression in her mother; Diabetes in her paternal grandmother; Hyperlipidemia in her mother; Thyroid disease in her sister.    ROS:  Please see the history of present illness.   Otherwise, review of systems are positive for none.   All other systems are reviewed and negative.    PHYSICAL EXAM: VS:  BP 114/70   Pulse 65   Ht 5\' 2"  (1.575 m)   Wt 213 lb (96.6 kg)   SpO2 97%   BMI 38.96 kg/m  , BMI Body mass index is 38.96 kg/m. GENERAL:  Well appearing.  No acute distress HEENT:  Pupils equal round and reactive, fundi not visualized, oral mucosa unremarkable NECK:  No jugular venous distention, waveform within normal limits, carotid upstroke brisk and symmetric, no bruits LUNGS:  Clear to auscultation bilaterally HEART:  RRR.  PMI not displaced or sustained,S1 and S2 within normal limits, no S3, no S4, no clicks, no rubs, I/VI systolic murmur at  ABD:  Flat, positive bowel sounds normal in frequency in pitch, no bruits, no rebound, no guarding, no midline pulsatile mass, no hepatomegaly, no splenomegaly EXT:  2 plus pulses throughout, 2+ tense LE edema bilatearlly, no cyanosis no clubbing SKIN:  Erythematous stasis dermatitis on the anterior tibia bilaterally NEURO:  Cranial nerves II through XII grossly intact, motor grossly  intact throughout PSYCH:  Cognitively intact, oriented to person place and time  EKG:  EKG is ordered today. The ekg ordered 12/11/15 demonstatrial fibrillation. Rate 64 bpm. PVC. Prior septal infarct.  Right axis deviation. 07/18/16: Atrial fibrillation.  Rate 80 bpm.    09/23/16:  Sinus bradycardia. Rate 58 bpm. Septal infarct. 12/21/18: Sinus rhythm.  Rate 65 bpm.  Poor R wave progression. 06/03/2019: Sinus rhythm.  Rate 65 bpm.  First-degree AV block.  Recent Labs: 09/20/2018: ALT 53; Brain Natriuretic Peptide 30 12/29/2018: TSH 4.92 03/17/2019: BUN 17; Creat 0.76; Potassium 4.2; Sodium 142    Lipid Panel    Component Value Date/Time   CHOL 205 (H) 01/29/2018 1052   TRIG 248 (H) 01/29/2018 1052   HDL 44 (L) 01/29/2018 1052   CHOLHDL 4.7 01/29/2018 1052   VLDL 34 (H) 07/02/2015 ID:[id number]  LDLCALC 123 (H) 01/29/2018 1052      Wt Readings from Last 3 Encounters:  06/03/19 213 lb (96.6 kg)  04/19/19 213 lb (96.6 kg)  03/17/19 213 lb (96.6 kg)      ASSESSMENT AND PLAN:  # Shortness of breath: Symptoms have improved since starting the Flovent inhaler.  # Atrial fibrillation: Martha Perkins remains in sinus rhythm.  She has had some short fluttering episodes but no sustained arrhythmia. Continue diltiazem and metoprolol.  It is unclear when she stopped Eliquis.  This patients CHA2DS2-VASc Score and unadjusted Ischemic Stroke Rate (% per year) is equal to 3.2 % stroke rate/year from a score of 3  Above score calculated as 1 point each if present [CHF, HTN, DM, Vascular=MI/PAD/Aortic Plaque, Age if 65-74, or Female] Above score calculated as 2 points each if present [Age > 75, or Stroke/TIA/TE]  # Hypertension:  Blood pressure is well have been controlled.  Continue diltiazem, metoprolol, and add furosemide daily.  # LE edema: She will start taking Lasix 20 mg daily.  We will check a basic metabolic panel in 1 to 2 weeks.  If her potassium is elevated she will need to take daily potassium  supplementation.  # Hyperlipidemia: Continue atorvastatin 40mg  daily.  She will see her PCP next month and have lipids and a CMP checked.  # Morbid obseity: Healthy Weight and Wellness   Current medicines are reviewed at length with the patient today.  The patient does not have concerns regarding medicines.  The following changes have been made: none  Labs/ tests ordered today include:   Orders Placed This Encounter  Procedures  . Basic metabolic panel  . EKG 12-Lead     Disposition:   FU with Kelwin Gibler C. Oval Linsey, MD, [facility] in 6 months   This note was written with the assistance of speech recognition software.  Please excuse any transcriptional errors.  Signed, Jaheem Hedgepath C. Oval Linsey, MD, [facility]  06/04/2019 1:53 PM    [facility]

## 2019-06-03 NOTE — Patient Instructions (Signed)
Medication Instructions:  START FUROSEMIDE 20 MG DAILY   *If you need a refill on your cardiac medications before your next appointment, please call your pharmacy*  Lab Work: BMET IN 1-2 WEEKS  If you have labs (blood work) drawn today and your tests are completely normal, you will receive your results only by: Marland Kitchen MyChart Message (if you have MyChart) OR . A paper copy in the mail If you have any lab test that is abnormal or we need to change your treatment, we will call you to review the results.  Testing/Procedures: NONE   Follow-Up: At [facility], you and your health needs are our priority.  As part of our continuing mission to provide you with exceptional heart care, we have created designated Provider Care Teams.  These Care Teams include your primary Cardiologist (physician) and Advanced Practice Providers (APPs -  Physician Assistants and Nurse Practitioners) who all work together to provide you with the care you need, when you need it.  We recommend signing up for the patient portal called "MyChart".  Sign up information is provided on this After Visit Summary.  MyChart is used to connect with patients for Virtual Visits (Telemedicine).  Patients are able to view lab/test results, encounter notes, upcoming appointments, etc.  Non-urgent messages can be sent to your provider as well.   To learn more about what you can do with MyChart, go to [email or web address].    Your next appointment:   6 month(s)  You will receive a reminder letter in the mail two months in advance. If you don't receive a letter, please call our office to schedule the follow-up appointment.  The format for your next appointment:   Either In Person or Virtual  Provider:   You may see DR [facility]  or one of the following Advanced Practice Providers on your designated Care Team:    [name], PA-C  [place], [place]  Coletta Memos, [place]

## 2019-06-04 ENCOUNTER — Encounter: Payer: Self-pay | Admitting: Cardiovascular Disease

## 2019-06-17 ENCOUNTER — Other Ambulatory Visit: Payer: Self-pay | Admitting: Family Medicine

## 2019-06-17 NOTE — Telephone Encounter (Signed)
Follow up visit 06/30/2019.

## 2019-06-20 ENCOUNTER — Other Ambulatory Visit: Payer: Self-pay | Admitting: Family Medicine

## 2019-06-20 DIAGNOSIS — I10 Essential (primary) hypertension: Secondary | ICD-10-CM

## 2019-06-30 ENCOUNTER — Other Ambulatory Visit: Payer: Self-pay

## 2019-06-30 ENCOUNTER — Encounter: Payer: Self-pay | Admitting: Family Medicine

## 2019-06-30 ENCOUNTER — Ambulatory Visit (INDEPENDENT_AMBULATORY_CARE_PROVIDER_SITE_OTHER): Payer: Medicare Other | Admitting: Family Medicine

## 2019-06-30 VITALS — BP 138/74 | HR 68 | Wt 215.0 lb

## 2019-06-30 DIAGNOSIS — R1012 Left upper quadrant pain: Secondary | ICD-10-CM | POA: Diagnosis not present

## 2019-06-30 DIAGNOSIS — I10 Essential (primary) hypertension: Secondary | ICD-10-CM | POA: Diagnosis not present

## 2019-06-30 DIAGNOSIS — G8929 Other chronic pain: Secondary | ICD-10-CM | POA: Diagnosis not present

## 2019-06-30 DIAGNOSIS — M7989 Other specified soft tissue disorders: Secondary | ICD-10-CM

## 2019-06-30 DIAGNOSIS — E890 Postprocedural hypothyroidism: Secondary | ICD-10-CM

## 2019-06-30 DIAGNOSIS — E8881 Metabolic syndrome: Secondary | ICD-10-CM

## 2019-06-30 DIAGNOSIS — M5136 Other intervertebral disc degeneration, lumbar region: Secondary | ICD-10-CM

## 2019-06-30 LAB — POCT GLYCOSYLATED HEMOGLOBIN (HGB A1C): Hemoglobin A1C: 5.6 % (ref 4.0–5.6)

## 2019-06-30 MED ORDER — TRAMADOL HCL 50 MG PO TABS: 50.0000 mg | ORAL_TABLET | Freq: Three times a day (TID) | ORAL | 0 refills | Status: DC | PRN

## 2019-06-30 NOTE — Assessment & Plan Note (Signed)
She is actually taking 2 tabs 2 days a week on Sunday and Thursdays and 1 tab the other 5 days a week.  She was having difficulty splitting so she just went to a whole extra tab.  We will recheck levels today and see how she is doing she really has not noticed much difference in any symptoms.  And she is currently asymptomatic in regards to her thyroid.

## 2019-06-30 NOTE — Assessment & Plan Note (Addendum)
Up slightly today but normally well controlled. Continue current regimen. Follow up in  6 months.

## 2019-06-30 NOTE — Assessment & Plan Note (Signed)
Well controlled. Continue current regimen. Follow up in  6 mon  Lab Results  Component Value Date   HGBA1C 5.6 06/30/2019

## 2019-06-30 NOTE — Progress Notes (Signed)
Established Patient Office Visit  Subjective:  Patient ID: Martha Perkins, female    DOB: 01-Feb-1946  Age: 74 y.o. MRN: [id number]  CC: No chief complaint on file.   HPI Martha Perkins presents for   Hypertension- Pt denies chest pain, SOB, dizziness, or heart palpitations.  Taking meds as directed w/o problems.  Denies medication side effects.    Impaired fasting glucose-no increased thirst or urination. No symptoms consistent with hypoglycemia.  Hypothyroidism - Taking medication regularly in the AM away from food and vitamins, etc. No recent change to skin, hair, or energy levels.  Last thyroid level was in October and it was slightly elevated at 4.9.  So I encouraged her to take an extra half a tab a day 2 days a week.  We are due to recheck those levels.  Last week had some LUQ pain that lasted about 4-5 days and then has been better the last 4 days.  She says the pain was intermittent during that timeframe.  She does have a history chronic constipation but says her bowels were moving like they normally do nothing seem different or changed.  She was not having any significant nausea she said she had maybe 1 day where she felt a little queasy.  No blood in the stool.  No loss of appetite.  No fevers chills or sweats.   Past Medical History:  Diagnosis Date  . Depression   . Hyperlipidemia   . Hypertension   . Hyperthyroidism 08/09/2015  . Kidney stone   . Paroxysmal atrial fibrillation ([place]) 08/09/2015  . Thyroid disease     Past Surgical History:  Procedure Laterality Date  . CARDIOVERSION N/A 08/05/2016   Procedure: CARDIOVERSION;  Surgeon: Skeet Latch, MD;  Location: [facility];  Service: Cardiovascular;  Laterality: N/A;  . CATARACT EXTRACTION Left 09/2012   Dr. Bunnie Philips  . LITHOTRIPSY    . REPLACEMENT TOTAL KNEE BILATERAL  2011  . THYROIDECTOMY  1971  . TUBAL LIGATION  1979    Family History  Problem Relation Age of Onset  . Coronary artery disease Mother 44        deceased  . Depression Mother   . Hyperlipidemia Mother   . Diabetes Paternal Grandmother   . Thyroid disease Sister   . COPD Father     Social History   Socioeconomic History  . Marital status: Married    Spouse name: Remo Lipps  . Number of children: 2  . Years of education: 16  . Highest education level: Bachelor's degree (e.g., BA, AB, BS)  Occupational History  . Occupation: Retired Therapist, sports.   Tobacco Use  . Smoking status: Never Smoker  . Smokeless tobacco: Never Used  Substance and Sexual Activity  . Alcohol use: No  . Drug use: No  . Sexual activity: Not Currently  Other Topics Concern  . Not on file  Social History Narrative   Stay around the house. Go to grocery store.    Social Determinants of Health   Financial Resource Strain:   . Difficulty of Paying Living Expenses:   Food Insecurity:   . Worried About Charity fundraiser in the Last Year:   . Arboriculturist in the Last Year:   Transportation Needs:   . Film/video editor (Medical):   Marland Kitchen Lack of Transportation (Non-Medical):   Physical Activity:   . Days of Exercise per Week:   . Minutes of Exercise per Session:   Stress:   . Feeling  of Stress :   Social Connections:   . Frequency of Communication with Friends and Family:   . Frequency of Social Gatherings with Friends and Family:   . Attends Religious Services:   . Active Member of Clubs or Organizations:   . Attends Archivist Meetings:   Marland Kitchen Marital Status:   Intimate Partner Violence:   . Fear of Current or Ex-Partner:   . Emotionally Abused:   Marland Kitchen Physically Abused:   . Sexually Abused:     Outpatient Medications Prior to Visit  Medication Sig Dispense Refill  . acetaminophen (TYLENOL) 650 MG CR tablet Take 1 tablet (650 mg total) by mouth every 8 (eight) hours. 90 tablet 3  . albuterol (VENTOLIN HFA) 108 (90 Base) MCG/ACT inhaler INHALE 2 PUFFS BY MOUTH EVERY 6 HOURS AS NEEDED FOR WHEEZING OR SHORTNESS OF BREATH 9 g 0  .  atorvastatin (LIPITOR) 40 MG tablet Take 1 tablet (40 mg total) by mouth daily. 90 tablet 3  . clotrimazole-betamethasone (LOTRISONE) cream Apply 1 application topically 2 (two) times daily. 45 g 2  . diltiazem (CARDIZEM CD) 240 MG 24 hr capsule Take 1 capsule (240 mg total) by mouth daily. 90 capsule 3  . fluticasone (FLONASE) 50 MCG/ACT nasal spray USE 1 SPRAY IN EACH NOSTRIL TWO TIMES DAILY 48 g 3  . fluticasone (FLOVENT HFA) 110 MCG/ACT inhaler Inhale 2 puffs into the lungs 2 (two) times daily. 1 Inhaler 2  . furosemide (LASIX) 20 MG tablet Take 1 tablet (20 mg total) by mouth daily. 90 tablet 3  . ketoconazole (NIZORAL) 2 % cream Apply topically daily as needed for irritation. 60 g 3  . levothyroxine (SYNTHROID) 112 MCG tablet Take 1 tablet (112 mcg total) by mouth daily before breakfast. 90 tablet 3  . meloxicam (MOBIC) 15 MG tablet TAKE 1 TABLET BY MOUTH  DAILY 90 tablet 3  . metoprolol succinate (TOPROL-XL) 50 MG 24 hr tablet TAKE 1 TABLET BY MOUTH  DAILY WITH OR IMMEDIATELY  FOLLOWING A MEAL 90 tablet 3  . potassium chloride (K-DUR) 10 MEQ tablet Take 1 tablet (10 mEq total) by mouth daily as needed. 30 tablet 1  . traMADol (ULTRAM) 50 MG tablet Take 1 tablet by mouth three times daily as needed 270 tablet 0   No facility-administered medications prior to visit.    Allergies  Allergen Reactions  . Demerol Nausea Only  . Meperidine Hcl Other (See Comments)    Feels loopy  . Oxycodone-Aspirin Other (See Comments)    Feels loopy  . Percocet [Oxycodone-Acetaminophen] Other (See Comments)    Feels loopy    ROS Review of Systems    Objective:    Physical Exam  Constitutional: She is oriented to person, place, and time. She appears well-developed and well-nourished.  HENT:  Head: Normocephalic and atraumatic.  Cardiovascular: Normal rate, regular rhythm and normal heart sounds.  Pulmonary/Chest: Effort normal and breath sounds normal.  Abdominal: Soft. Bowel sounds are  normal. She exhibits no distension and no mass. There is no abdominal tenderness. There is no rebound and no guarding.  Neurological: She is alert and oriented to person, place, and time.  Skin: Skin is warm and dry.  Psychiatric: She has a normal mood and affect. Her behavior is normal.    BP 138/74 (BP Location: Left Arm, Patient Position: Sitting, Cuff Size: Large)   Pulse 68   Wt 215 lb (97.5 kg)   SpO2 97%   BMI 39.32 kg/m  Wt Readings  from Last 3 Encounters:  06/30/19 215 lb (97.5 kg)  06/03/19 213 lb (96.6 kg)  04/19/19 213 lb (96.6 kg)     There are no preventive care reminders to display for this patient.  There are no preventive care reminders to display for this patient.  Lab Results  Component Value Date   TSH 4.92 (H) 12/29/2018   Lab Results  Component Value Date   WBC 5.8 12/08/2016   HGB 13.5 12/08/2016   HCT 38.7 12/08/2016   MCV 91.5 12/08/2016   PLT 203 12/08/2016   Lab Results  Component Value Date   NA 142 03/17/2019   K 4.2 03/17/2019   CO2 27 03/17/2019   GLUCOSE 95 03/17/2019   BUN 17 03/17/2019   CREATININE 0.76 03/17/2019   BILITOT 1.0 09/20/2018   ALKPHOS 119 08/25/2016   AST 35 09/20/2018   ALT 53 (H) 09/20/2018   PROT 6.9 09/20/2018   ALBUMIN 4.4 08/25/2016   CALCIUM 9.7 03/17/2019   Lab Results  Component Value Date   CHOL 205 (H) 01/29/2018   Lab Results  Component Value Date   HDL 44 (L) 01/29/2018   Lab Results  Component Value Date   LDLCALC 123 (H) 01/29/2018   Lab Results  Component Value Date   TRIG 248 (H) 01/29/2018   Lab Results  Component Value Date   CHOLHDL 4.7 01/29/2018   Lab Results  Component Value Date   HGBA1C 5.6 06/30/2019      Assessment & Plan:   Problem List Items Addressed This Visit      Cardiovascular and Mediastinum   Hypertension - Primary    Up slightly today but normally well controlled. Continue current regimen. Follow up in  6 months.        Relevant Orders   Lipid  Panel w/reflex Direct LDL     Endocrine   Insulin resistance    Well controlled. Continue current regimen. Follow up in  6 mon  Lab Results  Component Value Date   HGBA1C 5.6 06/30/2019         Relevant Orders   POCT HgB A1C (Completed)   Lipid Panel w/reflex Direct LDL   Hypothyroidism    She is actually taking 2 tabs 2 days a week on Sunday and Thursdays and 1 tab the other 5 days a week.  She was having difficulty splitting so she just went to a whole extra tab.  We will recheck levels today and see how she is doing she really has not noticed much difference in any symptoms.  And she is currently asymptomatic in regards to her thyroid.      Relevant Orders   TSH     Musculoskeletal and Integument   Lumbar degenerative disc disease   Relevant Medications   traMADol (ULTRAM) 50 MG tablet (Start on 07/19/2019)     Other   Localized swelling of both lower extremities    IMproved on daily furosemide dosing.      Encounter for chronic pain management    On tramadol chronically of Lumbar DDD and chronic shoulder pain.    Indication for chronic opioid: Lumbar DDD/chronic shoulder pain Medication and dose: Tramadol 50mg , one po TID # pills per month: 90 days Last UDS date: N/A Opioid Treatment Agreement signed (Y/N): Will update at next OV.     NCCSRS reviewed this encounter (include red flags):   Y      Relevant Medications   traMADol (ULTRAM) 50 MG tablet (Start  on 07/19/2019)    Other Visit Diagnoses    LUQ pain       Relevant Orders   COMPLETE METABOLIC PANEL WITH GFR   CBC with Differential/Platelet   Lipase     Left Upper quadrant pain-seems to be better over the last 4 days seems to have resolved on its own but I will check a CBC and a lipase.  Suspect that it may be related to her constipation may want to try taking a laxative as needed to see if this is helpful but again it seems to have resolved on its own.  She did let me know she saw cardiology recently  they have increased her Lasix to daily but she has not been able to take it every day because it does make her urinate frequently and when she has to leave the house she just cannot take it.  So on average she has been taking it 3 to 4 days/week she has been holding off on taking the potassium with it for now. They plan to recheck her Cr/K+  Meds ordered this encounter  Medications  . traMADol (ULTRAM) 50 MG tablet    Sig: Take 1 tablet (50 mg total) by mouth 3 (three) times daily as needed.    Dispense:  90 tablet    Refill:  0    Follow-up: Return in about 6 months (around 12/30/2019) for Hypertension and thyroid.    Beatrice Lecher, MD

## 2019-06-30 NOTE — Assessment & Plan Note (Addendum)
On tramadol chronically of Lumbar DDD and chronic shoulder pain.    Indication for chronic opioid: Lumbar DDD/chronic shoulder pain Medication and dose: Tramadol 50mg , one po TID # pills per month: 90 days Last UDS date: N/A Opioid Treatment Agreement signed (Y/N): Will update at next OV.     NCCSRS reviewed this encounter (include red flags):   Martha Perkins

## 2019-06-30 NOTE — Assessment & Plan Note (Signed)
IMproved on daily furosemide dosing.

## 2019-07-01 ENCOUNTER — Other Ambulatory Visit: Payer: Self-pay | Admitting: Family Medicine

## 2019-07-01 DIAGNOSIS — E890 Postprocedural hypothyroidism: Secondary | ICD-10-CM

## 2019-07-01 LAB — COMPLETE METABOLIC PANEL WITH GFR
AG Ratio: 1.5 (calc) (ref 1.0–2.5)
ALT: 35 U/L — ABNORMAL HIGH (ref 6–29)
AST: 24 U/L (ref 10–35)
Albumin: 4.3 g/dL (ref 3.6–5.1)
Alkaline phosphatase (APISO): 89 U/L (ref 37–153)
BUN: 16 mg/dL (ref 7–25)
CO2: 25 mmol/L (ref 20–32)
Calcium: 9.7 mg/dL (ref 8.6–10.4)
Chloride: 105 mmol/L (ref 98–110)
Creat: 0.69 mg/dL (ref 0.60–0.93)
GFR, Est African American: 102 mL/min/{1.73_m2} (ref >=60)
GFR, Est Non African American: 88 mL/min/{1.73_m2} (ref >=60)
Globulin: 2.8 g/dL (calc) (ref 1.9–3.7)
Glucose, Bld: 88 mg/dL (ref 65–99)
Potassium: 4 mmol/L (ref 3.5–5.3)
Sodium: 141 mmol/L (ref 135–146)
Total Bilirubin: 0.9 mg/dL (ref 0.2–1.2)
Total Protein: 7.1 g/dL (ref 6.1–8.1)

## 2019-07-01 LAB — CBC WITH DIFFERENTIAL/PLATELET
Absolute Monocytes: 471 cells/uL (ref 200–950)
Basophils Absolute: 81 cells/uL (ref 0–200)
Basophils Relative: 1.3 %
Eosinophils Absolute: 291 cells/uL (ref 15–500)
Eosinophils Relative: 4.7 %
HCT: 41 % (ref 35.0–45.0)
Hemoglobin: 13.8 g/dL (ref 11.7–15.5)
Lymphs Abs: 2418 cells/uL (ref 850–3900)
MCH: 30.3 pg (ref 27.0–33.0)
MCHC: 33.7 g/dL (ref 32.0–36.0)
MCV: 90.1 fL (ref 80.0–100.0)
MPV: 11.4 fL (ref 7.5–12.5)
Monocytes Relative: 7.6 %
Neutro Abs: 2939 cells/uL (ref 1500–7800)
Neutrophils Relative %: 47.4 %
Platelets: 187 10*3/uL (ref 140–400)
RBC: 4.55 10*6/uL (ref 3.80–5.10)
RDW: 12.2 % (ref 11.0–15.0)
Total Lymphocyte: 39 %
WBC: 6.2 10*3/uL (ref 3.8–10.8)

## 2019-07-01 LAB — LIPID PANEL W/REFLEX DIRECT LDL
Cholesterol: 180 mg/dL (ref <200)
HDL: 51 mg/dL (ref >=50)
LDL Cholesterol (Calc): 105 mg/dL (calc) — ABNORMAL HIGH
Non-HDL Cholesterol (Calc): 129 mg/dL (calc) (ref <130)
Total CHOL/HDL Ratio: 3.5 (calc) (ref <5.0)
Triglycerides: 147 mg/dL (ref <150)

## 2019-07-01 LAB — LIPASE: Lipase: 18 U/L (ref 7–60)

## 2019-07-01 LAB — TSH: TSH: 5.54 mIU/L — ABNORMAL HIGH (ref 0.40–4.50)

## 2019-07-01 MED ORDER — LEVOTHYROXINE SODIUM 125 MCG PO TABS: 125.0000 ug | ORAL_TABLET | Freq: Every day | ORAL | 0 refills | Status: DC

## 2019-07-19 ENCOUNTER — Telehealth: Payer: Self-pay

## 2019-07-19 NOTE — Telephone Encounter (Signed)
Lanaia would like to know who Dr Madilyn Fireman would recommend for ophthalmology. She hasn't had an examination in 5 years.

## 2019-07-20 NOTE — Telephone Encounter (Signed)
Unable to reach patient, voicemail is full.

## 2019-07-20 NOTE — Telephone Encounter (Signed)
[facility] associates. Can place referral if neede.

## 2019-07-23 ENCOUNTER — Other Ambulatory Visit: Payer: Self-pay | Admitting: Family Medicine

## 2019-07-23 DIAGNOSIS — J4521 Mild intermittent asthma with (acute) exacerbation: Secondary | ICD-10-CM

## 2019-07-25 NOTE — Telephone Encounter (Signed)
Patient advised.

## 2019-08-26 ENCOUNTER — Telehealth: Payer: Self-pay

## 2019-08-26 ENCOUNTER — Other Ambulatory Visit: Payer: Self-pay

## 2019-08-26 ENCOUNTER — Emergency Department
Admission: EM | Admit: 2019-08-26 | Discharge: 2019-08-26 | Disposition: A | Discharge status: Home / Self Care | Payer: Medicare Other | Source: Home / Self Care | Attending: Family Medicine | Admitting: Family Medicine

## 2019-08-26 ENCOUNTER — Encounter: Payer: Self-pay | Admitting: Emergency Medicine

## 2019-08-26 DIAGNOSIS — R300 Dysuria: Secondary | ICD-10-CM

## 2019-08-26 DIAGNOSIS — N3000 Acute cystitis without hematuria: Secondary | ICD-10-CM

## 2019-08-26 LAB — POCT URINALYSIS DIP (MANUAL ENTRY)
Bilirubin, UA: NEGATIVE
Glucose, UA: NEGATIVE mg/dL
Ketones, POC UA: NEGATIVE mg/dL
Nitrite, UA: NEGATIVE
Protein Ur, POC: NEGATIVE mg/dL
Spec Grav, UA: 1.015 (ref 1.010–1.025)
Urobilinogen, UA: 0.2 E.U./dL
pH, UA: 6 (ref 5.0–8.0)

## 2019-08-26 MED ORDER — CEPHALEXIN 500 MG PO CAPS: 500.0000 mg | ORAL_CAPSULE | Freq: Two times a day (BID) | ORAL | 0 refills | Status: DC

## 2019-08-26 NOTE — ED Provider Notes (Signed)
[facility]    CSN: [id number] Arrival date & time: 08/26/19  1301      History   Chief Complaint Chief Complaint  Patient presents with  . Dysuria  . Back Pain    lower     HPI Martha Perkins is a 74 y.o. female.   About ten days ago patient observed that her urine was darker with "bubbles" in it.  This morning she developed vague lower abdominal pressure, lower back ache, urgency, hesitancy, and chills.   The history is provided by the patient.  Dysuria Pain quality:  Burning Pain severity:  Mild Onset quality:  Sudden Duration:  6 hours Timing:  Constant Progression:  Worsening Chronicity:  New Recent urinary tract infections: no   Relieved by:  Nothing Worsened by:  Nothing Ineffective treatments:  None tried Urinary symptoms: discolored urine, frequent urination and hesitancy   Urinary symptoms: no foul-smelling urine, no hematuria and no bladder incontinence   Associated symptoms: no fever, no flank pain, no genital lesions, no nausea and no vaginal discharge     Past Medical History:  Diagnosis Date  . Depression   . Hyperlipidemia   . Hypertension   . Hyperthyroidism 08/09/2015  . Kidney stone   . Paroxysmal atrial fibrillation ([place]) 08/09/2015  . Thyroid disease     Patient Active Problem List   Diagnosis Date Noted  . Encounter for chronic pain management 06/30/2019  . Localized swelling of both lower extremities 09/20/2018  . Hypothyroidism 09/11/2016  . History of atrial fibrillation   . 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/14/2016  . Restrictive lung disease 09/28/2015  . Lumbar degenerative disc disease 08/15/2015  . Chronic allergic rhinitis 05/17/2014  . Cataract 06/03/2012  . Hyperlipidemia LDL goal <100 12/21/2010  . Hypertension 12/21/2010  . GERD (gastroesophageal reflux disease) 07/18/2010  . Insulin resistance 07/18/2010  . Fatty liver 07/18/2010    Past Surgical History:  Procedure Laterality Date  .  CARDIOVERSION N/A 08/05/2016   Procedure: CARDIOVERSION;  Surgeon: Skeet Latch, MD;  Location: [facility];  Service: Cardiovascular;  Laterality: N/A;  . CATARACT EXTRACTION Left 09/2012   Dr. Bunnie Philips  . LITHOTRIPSY    . REPLACEMENT TOTAL KNEE BILATERAL  2011  . THYROIDECTOMY  1971  . TUBAL LIGATION  1979    OB History   No obstetric history on file.      Home Medications    Prior to Admission medications   Medication Sig Start Date End Date Taking? Authorizing Provider  albuterol (VENTOLIN HFA) 108 (90 Base) MCG/ACT inhaler INHALE TWO PUFFS INTO THE LUNGS EVERY 6 HOURS AS NEEDED FOR WHEEZING OR SHORTNESS OF BREATH 07/25/19  Yes Hali Marry, MD  atorvastatin (LIPITOR) 40 MG tablet Take 1 tablet (40 mg total) by mouth daily. 09/01/18  Yes Hali Marry, MD  diltiazem (CARDIZEM CD) 240 MG 24 hr capsule Take 1 capsule (240 mg total) by mouth daily. 10/26/18  Yes Hali Marry, MD  fluticasone (FLONASE) 50 MCG/ACT nasal spray USE 1 SPRAY IN EACH NOSTRIL TWO TIMES DAILY 02/28/19  Yes Hali Marry, MD  fluticasone (FLOVENT HFA) 110 MCG/ACT inhaler Inhale 2 puffs into the lungs 2 (two) times daily. 03/17/19  Yes Hali Marry, MD  furosemide (LASIX) 20 MG tablet Take 1 tablet (20 mg total) by mouth daily. 06/03/19  Yes Skeet Latch, MD  levothyroxine (SYNTHROID) 125 MCG tablet Take 1 tablet (125 mcg total) by mouth daily before breakfast. 07/01/19  Yes Metheney,  Rene Kocher, MD  meloxicam (MOBIC) 15 MG tablet TAKE 1 TABLET BY MOUTH  DAILY 12/29/18  Yes Hali Marry, MD  metoprolol succinate (TOPROL-XL) 50 MG 24 hr tablet TAKE 1 TABLET BY MOUTH  DAILY WITH OR IMMEDIATELY  FOLLOWING A MEAL 06/20/19  Yes Hali Marry, MD  traMADol (ULTRAM) 50 MG tablet Take 1 tablet (50 mg total) by mouth 3 (three) times daily as needed. 07/19/19  Yes Hali Marry, MD  acetaminophen (TYLENOL) 650 MG CR tablet Take 1 tablet (650 mg  total) by mouth every 8 (eight) hours. 08/25/16   Silverio Decamp, MD  cephALEXin (KEFLEX) 500 MG capsule Take 1 capsule (500 mg total) by mouth 2 (two) times daily. 08/26/19   Kandra Nicolas, MD  clotrimazole-betamethasone (LOTRISONE) cream Apply 1 application topically 2 (two) times daily. 09/15/18   Edrick Kins, DPM  ketoconazole (NIZORAL) 2 % cream Apply topically daily as needed for irritation. 07/20/18   Hali Marry, MD  potassium chloride (K-DUR) 10 MEQ tablet Take 1 tablet (10 mEq total) by mouth daily as needed. 08/23/18   Hali Marry, MD    Family History Family History  Problem Relation Age of Onset  . Coronary artery disease Mother 44       deceased  . Depression Mother   . Hyperlipidemia Mother   . Diabetes Paternal Grandmother   . Thyroid disease Sister   . COPD Father     Social History Social History   Tobacco Use  . Smoking status: Never Smoker  . Smokeless tobacco: Never Used  Vaping Use  . Vaping Use: Never used  Substance Use Topics  . Alcohol use: No  . Drug use: No     Allergies   Demerol, Meperidine hcl, Oxycodone-aspirin, and Percocet [oxycodone-acetaminophen]   Review of Systems Review of Systems  Constitutional: Positive for chills. Negative for diaphoresis, fatigue and fever.  Gastrointestinal: Negative for nausea.  Genitourinary: Positive for dysuria, frequency and urgency. Negative for difficulty urinating, flank pain, hematuria, pelvic pain and vaginal discharge.  All other systems reviewed and are negative.    Physical Exam Triage Vital Signs ED Triage Vitals  Enc Vitals Group     BP 08/26/19 1322 (!) 142/83     Pulse Rate 08/26/19 1322 68     Resp 08/26/19 1322 18     Temp 08/26/19 1322 97.9 F (36.6 C)     Temp Source 08/26/19 1322 Oral     SpO2 08/26/19 1322 97 %     Weight --      Height --      Head Circumference --      Peak Flow --      Pain Score 08/26/19 1326 4     Pain Loc --      Pain  Edu? --      Excl. in [place]? --    No data found.  Updated Vital Signs BP (!) 142/83 (BP Location: Right Arm)   Pulse 68   Temp 97.9 F (36.6 C) (Oral)   Resp 18   SpO2 97%   Visual Acuity Right Eye Distance:   Left Eye Distance:   Bilateral Distance:    Right Eye Near:   Left Eye Near:    Bilateral Near:     Physical Exam Nursing notes and Vital Signs reviewed. Appearance:  Patient appears stated age, and in no acute distress.    Eyes:  Pupils are equal, round, and reactive to  light and accomodation.  Extraocular movement is intact.  Conjunctivae are not inflamed   Pharynx:  Normal; moist mucous membranes  Neck:  Supple.  No adenopathy Lungs:  Clear to auscultation.  Breath sounds are equal.  Moving air well. Heart:  Regular rate and rhythm without murmurs, rubs, or gallops.  Abdomen:  Nontender without masses or hepatosplenomegaly.  Bowel sounds are present.  No CVA or flank tenderness.  Extremities:  No edema.  Skin:  No rash present.     UC Treatments / Results  Labs (all labs ordered are listed, but only abnormal results are displayed) Labs Reviewed  POCT URINALYSIS DIP (MANUAL ENTRY) - Abnormal; Notable for the following components:      Result Value   Blood, UA trace-intact (*)    Leukocytes, UA Small (1+) (*)    All other components within normal limits  URINE CULTURE    EKG   Radiology No results found.  Procedures Procedures (including critical care time)  Medications Ordered in UC Medications - No data to display  Initial Impression / Assessment and Plan / UC Course  I have reviewed the triage vital signs and the nursing notes.  Pertinent labs & imaging results that were available during my care of the patient were reviewed by me and considered in my medical decision making (see chart for details).    Urine culture pending.  Begin Keflex. Followup with Family Doctor if not improved in one week.    Final Clinical Impressions(s) / UC Diagnoses     Final diagnoses:  Dysuria  Acute cystitis without hematuria     Discharge Instructions     Increase fluid intake.  If symptoms become significantly worse during the night or over the weekend, proceed to the local emergency room.     ED Prescriptions    Medication Sig Dispense Auth. Provider   cephALEXin (KEFLEX) 500 MG capsule Take 1 capsule (500 mg total) by mouth 2 (two) times daily. 14 capsule Kandra Nicolas, MD        Kandra Nicolas, MD 08/29/19 [phone]

## 2019-08-26 NOTE — ED Triage Notes (Signed)
Pt stated she has had "bubbles in her urine" x 10 days  Has increased PO fluids C/o bladder pressure & lower back pain  Increased urgency & pain today  Tylenol 500mg   @ 1000 w/ tramadol C/o chills - no fever Covid vaccine Moderna (4/21)

## 2019-08-26 NOTE — Telephone Encounter (Signed)
Patient called requesting an urgent appointment for acute pelvic pain.  States pain started today. "It feels like the bottom wants to fall out". She has a hx of uti and stones but this doesn't feel like a UTI.   Advised patient to contact Urgent Care for an appointment since we have no available appointments.

## 2019-08-26 NOTE — Discharge Instructions (Addendum)
Increase fluid intake. °If symptoms become significantly worse during the night or over the weekend, proceed to the local emergency room.  °

## 2019-08-28 LAB — URINE CULTURE
MICRO NUMBER:: 10609820
Result:: NO GROWTH
SPECIMEN QUALITY:: ADEQUATE

## 2019-09-01 ENCOUNTER — Other Ambulatory Visit: Payer: Self-pay | Admitting: Family Medicine

## 2019-09-01 DIAGNOSIS — E890 Postprocedural hypothyroidism: Secondary | ICD-10-CM

## 2019-09-07 ENCOUNTER — Encounter: Payer: Self-pay | Admitting: Osteopathic Medicine

## 2019-09-07 ENCOUNTER — Other Ambulatory Visit: Payer: Self-pay

## 2019-09-07 ENCOUNTER — Ambulatory Visit (INDEPENDENT_AMBULATORY_CARE_PROVIDER_SITE_OTHER): Payer: Medicare Other | Admitting: Osteopathic Medicine

## 2019-09-07 ENCOUNTER — Ambulatory Visit (INDEPENDENT_AMBULATORY_CARE_PROVIDER_SITE_OTHER): Payer: Medicare Other

## 2019-09-07 VITALS — BP 140/61 | HR 69 | Wt 218.0 lb

## 2019-09-07 DIAGNOSIS — R300 Dysuria: Secondary | ICD-10-CM | POA: Diagnosis not present

## 2019-09-07 DIAGNOSIS — N200 Calculus of kidney: Secondary | ICD-10-CM

## 2019-09-07 DIAGNOSIS — R1031 Right lower quadrant pain: Secondary | ICD-10-CM

## 2019-09-07 DIAGNOSIS — R82998 Other abnormal findings in urine: Secondary | ICD-10-CM

## 2019-09-07 DIAGNOSIS — N1330 Unspecified hydronephrosis: Secondary | ICD-10-CM | POA: Diagnosis not present

## 2019-09-07 DIAGNOSIS — N201 Calculus of ureter: Secondary | ICD-10-CM | POA: Diagnosis not present

## 2019-09-07 DIAGNOSIS — N132 Hydronephrosis with renal and ureteral calculous obstruction: Secondary | ICD-10-CM | POA: Diagnosis not present

## 2019-09-07 DIAGNOSIS — I700 Atherosclerosis of aorta: Secondary | ICD-10-CM | POA: Diagnosis not present

## 2019-09-07 DIAGNOSIS — K429 Umbilical hernia without obstruction or gangrene: Secondary | ICD-10-CM | POA: Diagnosis not present

## 2019-09-07 LAB — POCT URINALYSIS DIPSTICK OB
Bilirubin, UA: NEGATIVE
Ketones, UA: NEGATIVE
Nitrite, UA: NEGATIVE
POC,PROTEIN,UA: NEGATIVE
Spec Grav, UA: 1.01 (ref 1.010–1.025)
Urobilinogen, UA: 1 E.U./dL
pH, UA: 7 (ref 5.0–8.0)

## 2019-09-07 NOTE — Patient Instructions (Signed)
Plan:  Await further urine testing to rule out infection, confirm blood   CT to evaluate for kidney stone or other intraabdominal or pelvic pathology  If all testing negative, can assume symptoms due to yeast infection +/- musculoskeletal pain, or other issue with vaginal skin or pelvic area- follow w/ PCP

## 2019-09-07 NOTE — Progress Notes (Addendum)
Martha Perkins is a 74 y.o. female who presents to  [facility] at [facility]  today, 09/07/19, seeking care for the following:   Follow up UTI - seen in UC 08/26/19 but UCx negative. Treated w/ Keflex. Here today c/o continued complaints of vaginal irritation but not necessarily dysuria - irritated constantly, helped by ketoconazole. Feels some sharp occasional pains in lower abdomen/R flank c/w previous kidney stones.      ASSESSMENT & PLAN with other pertinent findings:  The primary encounter diagnosis was Urine leukocytes increased. Diagnoses of Dysuria and Right lower quadrant abdominal pain were also pertinent to this visit.   No results found for this or any previous visit (from the past 24 hour(s)).     Patient Instructions  Plan:  Await further urine testing to rule out infection, confirm blood   CT to evaluate for kidney stone or other intraabdominal or pelvic pathology  If all testing negative, can assume symptoms due to yeast infection +/- musculoskeletal pain, or other issue with vaginal skin or pelvic area- follow w/ PCP      Orders Placed This Encounter  Procedures  . CT RENAL STONE STUDY  . POC Urinalysis Dipstick OB    No orders of the defined types were placed in this encounter.      Follow-up instructions: Return for RECHECK PENDING RESULTS / IF WORSE OR CHANGE.     ADDENDUM 09/08/19   CT RENAL STONE STUDY  Result Date: 09/08/2019 CLINICAL DATA:  Right flank pain.  Nephrolithiasis. EXAM: CT ABDOMEN AND PELVIS WITHOUT CONTRAST TECHNIQUE: Multidetector CT imaging of the abdomen and pelvis was performed following the standard protocol without IV contrast. COMPARISON:  None. FINDINGS: Lower chest: No acute findings. Calcified granuloma noted in the posterior left lower lobe. Hepatobiliary: No mass visualized on this unenhanced exam. Mild hepatic steatosis is seen with focal fatty sparing adjacent to gallbladder  fossa. Gallbladder is unremarkable. No evidence of biliary ductal dilatation. Pancreas: No mass or inflammatory process visualized on this unenhanced exam. Spleen:  Within normal limits in size. Adrenals/Urinary tract: Multiple small renal calculi are seen bilaterally measuring up to 5 mm. Moderate right hydroureteronephrosis and perinephric stranding is seen due to a 5 mm calculus in the distal right ureter. Stomach/Bowel: No evidence of obstruction, inflammatory process, or abnormal fluid collections. Vascular/Lymphatic: No pathologically enlarged lymph nodes identified. No evidence of abdominal aortic aneurysm. Aortic atherosclerosis noted. Reproductive: A few tiny calcified uterine fibroids are noted. No other significant abnormality identified. Other: A small paraumbilical ventral hernia is seen containing only fat. No evidence of herniated bowel loops. Musculoskeletal:  No suspicious bone lesions identified. IMPRESSION: 1. Moderate right hydroureteronephrosis and perinephric stranding due to 5 mm distal right ureteral calculus. 2. Bilateral nephrolithiasis. 3. Mild hepatic steatosis. 4. Small paraumbilical ventral hernia containing only fat. 5. Tiny calcified uterine fibroids. Aortic Atherosclerosis (ICD10-I70.0). Electronically Signed   By: Marlaine Hind M.D.   On: 09/08/2019 11:53       Orders Placed This Encounter  Procedures  . CT RENAL STONE STUDY  . Ambulatory referral to Urology  . POC Urinalysis Dipstick OB                                   BP 140/61 (BP Location: Left Arm, Patient Position: Sitting)   Pulse 69   Wt 218 lb (98.9 kg)   SpO2 100%   BMI  39.87 kg/m   Current Meds  Medication Sig  . acetaminophen (TYLENOL) 650 MG CR tablet Take 1 tablet (650 mg total) by mouth every 8 (eight) hours.  Marland Kitchen albuterol (VENTOLIN HFA) 108 (90 Base) MCG/ACT inhaler INHALE TWO PUFFS INTO THE LUNGS EVERY 6 HOURS AS NEEDED FOR WHEEZING OR SHORTNESS OF BREATH  .  atorvastatin (LIPITOR) 40 MG tablet Take 1 tablet (40 mg total) by mouth daily.  . cephALEXin (KEFLEX) 500 MG capsule Take 1 capsule (500 mg total) by mouth 2 (two) times daily.  . clotrimazole-betamethasone (LOTRISONE) cream Apply 1 application topically 2 (two) times daily.  Marland Kitchen diltiazem (CARDIZEM CD) 240 MG 24 hr capsule Take 1 capsule (240 mg total) by mouth daily.  . fluticasone (FLONASE) 50 MCG/ACT nasal spray USE 1 SPRAY IN EACH NOSTRIL TWO TIMES DAILY  . fluticasone (FLOVENT HFA) 110 MCG/ACT inhaler Inhale 2 puffs into the lungs 2 (two) times daily.  . furosemide (LASIX) 20 MG tablet Take 1 tablet (20 mg total) by mouth daily.  Marland Kitchen ketoconazole (NIZORAL) 2 % cream Apply topically daily as needed for irritation.  Marland Kitchen levothyroxine (SYNTHROID) 125 MCG tablet Take 1 tablet (125 mcg total) by mouth daily before breakfast.  . meloxicam (MOBIC) 15 MG tablet TAKE 1 TABLET BY MOUTH  DAILY  . metoprolol succinate (TOPROL-XL) 50 MG 24 hr tablet TAKE 1 TABLET BY MOUTH  DAILY WITH OR IMMEDIATELY  FOLLOWING A MEAL  . potassium chloride (K-DUR) 10 MEQ tablet Take 1 tablet (10 mEq total) by mouth daily as needed.  . traMADol (ULTRAM) 50 MG tablet Take 1 tablet (50 mg total) by mouth 3 (three) times daily as needed.    No results found for this or any previous visit (from the past 72 hour(s)).  No results found.     All questions at time of visit were answered - patient instructed to contact office with any additional concerns or updates.  ER/RTC precautions were reviewed with the patient as applicable.   Please note: voice recognition software was used to produce this document, and typos may escape review. Please contact Dr. Sheppard Coil for any needed clarifications.

## 2019-09-08 NOTE — Addendum Note (Signed)
Addended by: Maryla Morrow on: 09/08/2019 01:43 PM   Modules accepted: Orders

## 2019-09-09 DIAGNOSIS — N201 Calculus of ureter: Secondary | ICD-10-CM | POA: Diagnosis not present

## 2019-09-13 ENCOUNTER — Other Ambulatory Visit: Payer: Self-pay | Admitting: Urology

## 2019-09-22 DIAGNOSIS — N201 Calculus of ureter: Secondary | ICD-10-CM | POA: Diagnosis not present

## 2019-09-28 ENCOUNTER — Other Ambulatory Visit: Payer: Self-pay | Admitting: *Deleted

## 2019-09-28 DIAGNOSIS — E890 Postprocedural hypothyroidism: Secondary | ICD-10-CM

## 2019-09-28 DIAGNOSIS — N201 Calculus of ureter: Secondary | ICD-10-CM | POA: Diagnosis not present

## 2019-09-28 MED ORDER — LEVOTHYROXINE SODIUM 125 MCG PO TABS: 125.0000 ug | ORAL_TABLET | Freq: Every day | ORAL | 0 refills | Status: DC

## 2019-09-29 ENCOUNTER — Encounter (HOSPITAL_BASED_OUTPATIENT_CLINIC_OR_DEPARTMENT_OTHER): Admission: RE | Payer: Self-pay | Source: Home / Self Care

## 2019-09-29 ENCOUNTER — Ambulatory Visit (HOSPITAL_BASED_OUTPATIENT_CLINIC_OR_DEPARTMENT_OTHER): Admission: RE | Admit: 2019-09-29 | Payer: Medicare Other | Source: Home / Self Care | Admitting: Urology

## 2019-09-29 SURGERY — LITHOTRIPSY, ESWL
Anesthesia: LOCAL | Laterality: Right

## 2019-10-11 ENCOUNTER — Encounter: Payer: Self-pay | Admitting: Family Medicine

## 2019-10-11 DIAGNOSIS — H35363 Drusen (degenerative) of macula, bilateral: Secondary | ICD-10-CM | POA: Diagnosis not present

## 2019-10-11 DIAGNOSIS — H5319 Other subjective visual disturbances: Secondary | ICD-10-CM | POA: Diagnosis not present

## 2019-10-11 DIAGNOSIS — H43811 Vitreous degeneration, right eye: Secondary | ICD-10-CM | POA: Diagnosis not present

## 2019-10-11 DIAGNOSIS — H26493 Other secondary cataract, bilateral: Secondary | ICD-10-CM | POA: Diagnosis not present

## 2019-10-11 DIAGNOSIS — H40013 Open angle with borderline findings, low risk, bilateral: Secondary | ICD-10-CM | POA: Diagnosis not present

## 2019-10-11 DIAGNOSIS — H524 Presbyopia: Secondary | ICD-10-CM | POA: Diagnosis not present

## 2019-10-18 ENCOUNTER — Telehealth: Payer: Self-pay | Admitting: Cardiovascular Disease

## 2019-10-18 ENCOUNTER — Telehealth: Payer: Self-pay | Admitting: Family Medicine

## 2019-10-18 ENCOUNTER — Telehealth: Payer: Self-pay

## 2019-10-18 DIAGNOSIS — R002 Palpitations: Secondary | ICD-10-CM

## 2019-10-18 NOTE — Telephone Encounter (Signed)
Since patient is refusing to come on and refuses to see other provider for this, are you agreeable to ordering TSH to see if thyroid may be off and causing symptoms?   Not sure where else to go with this, since patient is refusing to be seen

## 2019-10-18 NOTE — Telephone Encounter (Signed)
States that she has a history of afib, has had palpations on and off for the last week but it is getting worse. Wanted to get her thyroid checked cause she thinks this may be causing it. She is taking levothyroxine (SYNTHROID) 125 MCG tablet [[id number]]  for this. Patient voiced very strongly that she doesn't want to see anyone else other than PCP. I let her know that PCP is out this week and I can get her in with someone else today or she would have to go to UC, also spoke to clinical lead, rachel about this. Patient said she wouldn't even be able to come in until Friday. Declined my appointment offer. Would like to speak to a triage nurse in regards to this.

## 2019-10-18 NOTE — Telephone Encounter (Signed)
See other phone note

## 2019-10-18 NOTE — Telephone Encounter (Signed)
Called patient and advised that labs were entered and asked if she would get lab drawn and see Samuel Bouche, NP, tomorrow in office at 10:30. Patient was very upset, states that testing thyroid will not help because she takes biotin and this interfered. I asked patient if she would be seen tomorrow by Caryl Asp at 10:30 to discuss palpitations and then discuss doing blood work, patient refused. Patient states the only way she would come in would be if it was on Friday with Dr Madilyn Fireman. I again reminded patient Dr Madilyn Fireman is not in office this week, she again stated she refuses to be seen.   I asked patient if she would be OK to call her cardiologist, she states she could try but they always tell her its a 3 month wait. I encouraged patient to reach out to Cardiology, she was agreeable to this.   FYI to PCP and covering provider.

## 2019-10-18 NOTE — Telephone Encounter (Signed)
Patient called into office with complaints of palpitations that have gotten worse over the last 4-5 days. Patient denies chest pain, dizziness, and shortness of breath, but does note to have congestion she states is related to her reactive air way disease. Patient states that she knows when she goes into afib and she does not feel like that is what it is. Patient has been using her albuterol inhaler more often that last 4-5 days and called her PCP to be seen. She states her PCP is on vacation and she is unable to be seen. Appointment made with [name] for Friday 8/13 at 9:15am to address. Patient notified that if she were to start feeling bad, or symptoms were to worsen then to go to the emergency room. Patient verbalized understanding.

## 2019-10-18 NOTE — Telephone Encounter (Signed)
I have placed the order for the TSH to be drawn at [facility].  If she is having palpitations and has a history of Afib, I STRONGLY recommend that she reach out to cardiology to be seen if she does not wish to be seen in this office by another provider.   I have forwarded this to Dr. Madilyn Fireman, as well.

## 2019-10-18 NOTE — Telephone Encounter (Signed)
Patient c/o Palpitations:  High priority if patient c/o lightheadedness, shortness of breath, or chest pain  1) How long have you had palpitations/irregular HR/ Afib? Are you having the symptoms now? Having them all the time off and on- the last 4 days she have noticed an increase in them- call her primary doctor office and they told her to contact Dr  2) Martha Perkins 3) Are you currently experiencing lightheadedness, SOB or CP? No little more congested in her chest this week  4) Do you have a history of afib (atrial fibrillation) or irregular heart rhythm? no  5) Have you checked your BP or HR? (document readings if available): no  6) Are you experiencing any other symptoms? No

## 2019-10-20 DIAGNOSIS — N201 Calculus of ureter: Secondary | ICD-10-CM | POA: Diagnosis not present

## 2019-10-21 ENCOUNTER — Ambulatory Visit: Payer: Medicare Other | Admitting: Cardiology

## 2019-10-21 ENCOUNTER — Telehealth: Payer: Self-pay | Admitting: Radiology

## 2019-10-21 ENCOUNTER — Other Ambulatory Visit: Payer: Self-pay

## 2019-10-21 VITALS — BP 132/62 | HR 65 | Ht 62.0 in | Wt 216.0 lb

## 2019-10-21 DIAGNOSIS — R002 Palpitations: Secondary | ICD-10-CM

## 2019-10-21 DIAGNOSIS — R0602 Shortness of breath: Secondary | ICD-10-CM

## 2019-10-21 DIAGNOSIS — E039 Hypothyroidism, unspecified: Secondary | ICD-10-CM

## 2019-10-21 DIAGNOSIS — I480 Paroxysmal atrial fibrillation: Secondary | ICD-10-CM | POA: Diagnosis not present

## 2019-10-21 DIAGNOSIS — E890 Postprocedural hypothyroidism: Secondary | ICD-10-CM

## 2019-10-21 DIAGNOSIS — J984 Other disorders of lung: Secondary | ICD-10-CM

## 2019-10-21 DIAGNOSIS — E785 Hyperlipidemia, unspecified: Secondary | ICD-10-CM | POA: Diagnosis not present

## 2019-10-21 DIAGNOSIS — E669 Obesity, unspecified: Secondary | ICD-10-CM

## 2019-10-21 NOTE — Assessment & Plan Note (Signed)
Seen today for evaluation of palpitations

## 2019-10-21 NOTE — Assessment & Plan Note (Signed)
LDL 105 April 2021 on statin Rx

## 2019-10-21 NOTE — Assessment & Plan Note (Signed)
BMI 39 

## 2019-10-21 NOTE — Progress Notes (Signed)
Cardiology Office Note:    Date:  10/21/2019   ID:  Martha Perkins, DOB [birth year], MRN [id number]  PCP:  Hali Marry, MD  Cardiologist:  No primary care provider on file.  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  In May 2017 she was diagnosed with Graves' disease and was hyperthyroid.  She was also diagnosed with atrial fibrillation.  The patient tells me that she had a partial thyroidectomy at age 48 for a tumor.  After her diagnosis in May 2017 she was treated with radiation therapy.  Once her thyroid was under control she underwent outpatient cardioversion in May 2018 and subsequent EKGs and office visit show her to be in normal sinus rhythm.  At some point her anticoagulation was stopped.  Echocardiogram done in October 2020 showed preserved LV function with an EF of 60 to 65%, no LVH, and normal left atrial size.  The patient is seen in the office today with complaints of palpitations.  She describes mainly skipped beats which she notices at rest.  She is also had some shortness of breath, at times she says she has to wake up in the middle the night and use her inhaler.  She has chronic lower extremity edema.  At her hairdresser's the other day it was noted that her hair was very dry.  The patient is concerned her hypothyroidism is uncontrolled.  She did have a TSH in April 2021 that was slightly elevated at 5.44.  She tells me her primary care provider did adjust her Synthroid at that time.  Past Medical History:  Diagnosis Date  . Depression   . Hyperlipidemia   . Hypertension   . Hyperthyroidism 08/09/2015  . Kidney stone   . Paroxysmal atrial fibrillation ([place]) 08/09/2015  . Thyroid disease     Past Surgical History:  Procedure Laterality Date  . CARDIOVERSION N/A 08/05/2016   Procedure: CARDIOVERSION;  Surgeon:  Skeet Latch, MD;  Location: [facility];  Service: Cardiovascular;  Laterality: N/A;  . CATARACT EXTRACTION Left 09/2012   Dr. Bunnie Philips  . LITHOTRIPSY    . REPLACEMENT TOTAL KNEE BILATERAL  2011  . THYROIDECTOMY  1971  . TUBAL LIGATION  1979    Current Medications: Current Meds  Medication Sig  . acetaminophen (TYLENOL) 650 MG CR tablet Take 1 tablet (650 mg total) by mouth every 8 (eight) hours.  Marland Kitchen albuterol (VENTOLIN HFA) 108 (90 Base) MCG/ACT inhaler INHALE TWO PUFFS INTO THE LUNGS EVERY 6 HOURS AS NEEDED FOR WHEEZING OR SHORTNESS OF BREATH  . atorvastatin (LIPITOR) 40 MG tablet Take 1 tablet (40 mg total) by mouth daily.  . clotrimazole-betamethasone (LOTRISONE) cream Apply 1 application topically 2 (two) times daily.  Marland Kitchen diltiazem (CARDIZEM CD) 240 MG 24 hr capsule Take 1 capsule (240 mg total) by mouth daily.  . fluticasone (FLONASE) 50 MCG/ACT nasal spray USE 1 SPRAY IN EACH NOSTRIL TWO TIMES DAILY  . fluticasone (FLOVENT HFA) 110 MCG/ACT inhaler Inhale 2 puffs into the lungs 2 (two) times daily.  . furosemide (LASIX) 20 MG tablet Take 1 tablet (20 mg total) by mouth daily.  Marland Kitchen ketoconazole (NIZORAL) 2 % cream Apply topically daily as needed for irritation.  Marland Kitchen levothyroxine (SYNTHROID) 125 MCG tablet Take 1 tablet (125 mcg total) by mouth daily before breakfast.  . meloxicam (MOBIC) 15  MG tablet TAKE 1 TABLET BY MOUTH  DAILY  . metoprolol succinate (TOPROL-XL) 50 MG 24 hr tablet TAKE 1 TABLET BY MOUTH  DAILY WITH OR IMMEDIATELY  FOLLOWING A MEAL  . potassium chloride (K-DUR) 10 MEQ tablet Take 1 tablet (10 mEq total) by mouth daily as needed.  . traMADol (ULTRAM) 50 MG tablet Take 1 tablet (50 mg total) by mouth 3 (three) times daily as needed.     Allergies:   Demerol, Meperidine hcl, Oxycodone-aspirin, and Percocet [oxycodone-acetaminophen]   Social History   Socioeconomic History  . Marital status: Married    Spouse name: Martha Perkins  . Number of children: 2  . Years  of education: 16  . Highest education level: Bachelor's degree (e.g., BA, AB, BS)  Occupational History  . Occupation: Retired Therapist, sports.   Tobacco Use  . Smoking status: Never Smoker  . Smokeless tobacco: Never Used  Vaping Use  . Vaping Use: Never used  Substance and Sexual Activity  . Alcohol use: No  . Drug use: No  . Sexual activity: Not Currently  Other Topics Concern  . Not on file  Social History Narrative   Stay around the house. Go to grocery store.    Social Determinants of Health   Financial Resource Strain:   . Difficulty of Paying Living Expenses:   Food Insecurity:   . Worried About Charity fundraiser in the Last Year:   . Arboriculturist in the Last Year:   Transportation Needs:   . Film/video editor (Medical):   Marland Kitchen Lack of Transportation (Non-Medical):   Physical Activity:   . Days of Exercise per Week:   . Minutes of Exercise per Session:   Stress:   . Feeling of Stress :   Social Connections:   . Frequency of Communication with Friends and Family:   . Frequency of Social Gatherings with Friends and Family:   . Attends Religious Services:   . Active Member of Clubs or Organizations:   . Attends Archivist Meetings:   Marland Kitchen Marital Status:      Family History: The patient's family history includes COPD in her father; Coronary artery disease (age of onset: 58) in her mother; Depression in her mother; Diabetes in her paternal grandmother; Hyperlipidemia in her mother; Thyroid disease in her sister.  ROS:   Please see the history of present illness.    Chronic LE edema, uses Lasix PRN  All other systems reviewed and are negative.  EKGs/Labs/Other Studies Reviewed:    The following studies were reviewed today: Echo Oct 2020  EKG:  EKG is  ordered today.  The ekg ordered today demonstrates NSR, HR 65  Recent Labs: 06/30/2019: ALT 35; BUN 16; Creat 0.69; Hemoglobin 13.8; Platelets 187; Potassium 4.0; Sodium 141; TSH 5.54  Recent Lipid Panel     Component Value Date/Time   CHOL 180 06/30/2019 1127   TRIG 147 06/30/2019 1127   HDL 51 06/30/2019 1127   CHOLHDL 3.5 06/30/2019 1127   VLDL 34 (H) 07/02/2015 0959   LDLCALC 105 (H) 06/30/2019 1127    Physical Exam:    VS:  BP 132/62   Pulse 65   Ht 5\' 2"  (1.575 m)   Wt 216 lb (98 kg)   BMI 39.51 kg/m     Wt Readings from Last 3 Encounters:  10/21/19 216 lb (98 kg)  09/07/19 218 lb (98.9 kg)  06/30/19 215 lb (97.5 kg)     GEN:  Morbidly  obese Caucasian female, in no acute distress HEENT: Normal NECK: No JVD; No carotid bruits CARDIAC: RRR, no murmurs, rubs, gallops RESPIRATORY:  Clear to auscultation without rales, wheezing or rhonchi  ABDOMEN: Soft, non-tender, non-distended MUSCULOSKELETAL:  Bilateral LE edema- chronic No deformity  SKIN: Warm and dry NEUROLOGIC:  Alert and oriented x 3 PSYCHIATRIC:  Normal affect   ASSESSMENT:    PAF (paroxysmal atrial fibrillation) ([place]) Originally diagnosed in May 2017 in the setting of Graves' disease and hyperthyroidism.  Eventually she underwent DC cardioversion in May 2018 and subsequent visits she was in normal sinus rhythm.  At some point Eliquis was discontinued, she is a [place] VASC 3.  Palpitations Seen today for evaluation of palpitations  Hypothyroidism Partial thyroidectomy at age 16 for "tumor". Diagnosed with Graves disease and hyperthyroidism in May 2017-treated with ablative Rx, now hypothyroid. TSH 5.44 in April 2021  Hyperlipidemia LDL goal <100 LDL 105 April 2021 on statin Rx  Restrictive lung disease On inhalers. Some increased SOB- check BNP  Obesity (BMI 30-39.9) BMI 39  PLAN:    Encouraged to use Lasix daily.  Check 14 day ZIO.  If she has PAF documented she will need to resume Eliquis.  Check TSH and free T4.  Check BNP to r/o volume overload.   Medication Adjustments/Labs and Tests Ordered: Current medicines are reviewed at length with the patient today.  Concerns regarding medicines are  outlined above.  Orders Placed This Encounter  Procedures  . TSH  . T4, free  . Basic metabolic panel  . Brain natriuretic peptide  . LONG TERM MONITOR (3-14 DAYS)  . EKG 12-Lead   No orders of the defined types were placed in this encounter.   Patient Instructions  Medication Instructions:  Your physician recommends that you continue on your current medications as directed. Please refer to the Current Medication list given to you today.  *If you need a refill on your cardiac medications before your next appointment, please call your pharmacy*   Lab Work: Your physician recommends that you return for lab work today: TSH, Free T4, BNP, BMET  If you have labs (blood work) drawn today and your tests are completely normal, you will receive your results only by: Marland Kitchen MyChart Message (if you have MyChart) OR . A paper copy in the mail If you have any lab test that is abnormal or we need to change your treatment, we will call you to review the results.   Testing/Procedures: Bryn Gulling- Long Term Monitor Instructions   Your physician has requested you wear your ZIO patch monitor___14___days.   This is a single patch monitor.  [employer] supplies one patch monitor per enrollment.  Additional stickers are not available.   Please do not apply patch if you will be having a Nuclear Stress Test, Echocardiogram, Cardiac CT, MRI, or Chest Xray during the time frame you would be wearing the monitor. The patch cannot be worn during these tests.  You cannot remove and re-apply the ZIO XT patch monitor.   Your ZIO patch monitor will be sent USPS Priority mail from [facility] directly to your home address. The monitor may also be mailed to a PO BOX if home delivery is not available.   It may take 3-5 days to receive your monitor after you have been enrolled.   Once you have received you monitor, please review enclosed instructions.  Your monitor has already been registered assigning a specific  monitor serial # to you.   Applying  the monitor   Shave hair from upper left chest.   Hold abrader disc by orange tab.  Rub abrader in 40 strokes over left upper chest as indicated in your monitor instructions.   Clean area with 4 enclosed alcohol pads .  Use all pads to assure are is cleaned thoroughly.  Let dry.   Apply patch as indicated in monitor instructions.  Patch will be place under collarbone on left side of chest with arrow pointing upward.   Rub patch adhesive wings for 2 minutes.Remove white label marked "1".  Remove white label marked "2".  Rub patch adhesive wings for 2 additional minutes.   While looking in a mirror, press and release button in center of patch.  A small green light will flash 3-4 times .  This will be your only indicator the monitor has been turned on.     Do not shower for the first 24 hours.  You may shower after the first 24 hours.   Press button if you feel a symptom. You will hear a small click.  Record Date, Time and Symptom in the Patient Log Book.   When you are ready to remove patch, follow instructions on last 2 pages of Patient Log Book.  Stick patch monitor onto last page of Patient Log Book.   Place Patient Log Book in [place] box.  Use locking tab on box and tape box closed securely.  The [employer] and [employer] has [employer] on it.  Please place in mailbox as soon as possible.  Your physician should have your test results approximately 7 days after the monitor has been mailed back to [facility].   Call [place] at [phone] if you have questions regarding your ZIO XT patch monitor.  Call them immediately if you see an orange light blinking on your monitor.   If your monitor falls off in less than 4 days contact our Monitor department at [phone].  If your monitor becomes loose or falls off after 4 days call [employer] at [phone] for suggestions on securing your monitor.     Follow-Up: At [facility],  you and your health needs are our priority.  As part of our continuing mission to provide you with exceptional heart care, we have created designated Provider Care Teams.  These Care Teams include your primary Cardiologist (physician) and Advanced Practice Providers (APPs -  Physician Assistants and Nurse Practitioners) who all work together to provide you with the care you need, when you need it.  We recommend signing up for the patient portal called "MyChart".  Sign up information is provided on this After Visit Summary.  MyChart is used to connect with patients for Virtual Visits (Telemedicine).  Patients are able to view lab/test results, encounter notes, upcoming appointments, etc.  Non-urgent messages can be sent to your provider as well.   To learn more about what you can do with MyChart, go to [email or web address].    Your next appointment:   3 month(s)  The format for your next appointment:   In Person  Provider:   Skeet Latch, MD       Signed, [name], PA-C  10/21/2019 9:40 AM    [place]

## 2019-10-21 NOTE — Addendum Note (Signed)
Addended by: Therisa Doyne on: 10/21/2019 10:55 AM   Modules accepted: Orders

## 2019-10-21 NOTE — Assessment & Plan Note (Addendum)
Partial thyroidectomy at age 74 for "tumor". Diagnosed with Graves disease and hyperthyroidism in May 2017-treated with ablative Rx, now hypothyroid. TSH 5.44 in April 2021

## 2019-10-21 NOTE — Patient Instructions (Signed)
Medication Instructions:  Your physician recommends that you continue on your current medications as directed. Please refer to the Current Medication list given to you today.  *If you need a refill on your cardiac medications before your next appointment, please call your pharmacy*   Lab Work: Your physician recommends that you return for lab work today: TSH, Free T4, BNP, BMET  If you have labs (blood work) drawn today and your tests are completely normal, you will receive your results only by: Marland Kitchen MyChart Message (if you have MyChart) OR . A paper copy in the mail If you have any lab test that is abnormal or we need to change your treatment, we will call you to review the results.   Testing/Procedures: Bryn Gulling- Long Term Monitor Instructions   Your physician has requested you wear your ZIO patch monitor___14___days.   This is a single patch monitor.  [employer] supplies one patch monitor per enrollment.  Additional stickers are not available.   Please do not apply patch if you will be having a Nuclear Stress Test, Echocardiogram, Cardiac CT, MRI, or Chest Xray during the time frame you would be wearing the monitor. The patch cannot be worn during these tests.  You cannot remove and re-apply the ZIO XT patch monitor.   Your ZIO patch monitor will be sent USPS Priority mail from [facility] directly to your home address. The monitor may also be mailed to a PO BOX if home delivery is not available.   It may take 3-5 days to receive your monitor after you have been enrolled.   Once you have received you monitor, please review enclosed instructions.  Your monitor has already been registered assigning a specific monitor serial # to you.   Applying the monitor   Shave hair from upper left chest.   Hold abrader disc by orange tab.  Rub abrader in 40 strokes over left upper chest as indicated in your monitor instructions.   Clean area with 4 enclosed alcohol pads .  Use all pads to  assure are is cleaned thoroughly.  Let dry.   Apply patch as indicated in monitor instructions.  Patch will be place under collarbone on left side of chest with arrow pointing upward.   Rub patch adhesive wings for 2 minutes.Remove white label marked "1".  Remove white label marked "2".  Rub patch adhesive wings for 2 additional minutes.   While looking in a mirror, press and release button in center of patch.  A small green light will flash 3-4 times .  This will be your only indicator the monitor has been turned on.     Do not shower for the first 24 hours.  You may shower after the first 24 hours.   Press button if you feel a symptom. You will hear a small click.  Record Date, Time and Symptom in the Patient Log Book.   When you are ready to remove patch, follow instructions on last 2 pages of Patient Log Book.  Stick patch monitor onto last page of Patient Log Book.   Place Patient Log Book in [place] box.  Use locking tab on box and tape box closed securely.  The [employer] has [employer] on it.  Please place in mailbox as soon as possible.  Your physician should have your test results approximately 7 days after the monitor has been mailed back to [facility].   Call [place] at [phone] if you have questions  regarding your ZIO XT patch monitor.  Call them immediately if you see an orange light blinking on your monitor.   If your monitor falls off in less than 4 days contact our Monitor department at [phone].  If your monitor becomes loose or falls off after 4 days call [employer] at [phone] for suggestions on securing your monitor.     Follow-Up: At [facility], you and your health needs are our priority.  As part of our continuing mission to provide you with exceptional heart care, we have created designated Provider Care Teams.  These Care Teams include your primary Cardiologist (physician) and Advanced Practice Providers (APPs -   Physician Assistants and Nurse Practitioners) who all work together to provide you with the care you need, when you need it.  We recommend signing up for the patient portal called "MyChart".  Sign up information is provided on this After Visit Summary.  MyChart is used to connect with patients for Virtual Visits (Telemedicine).  Patients are able to view lab/test results, encounter notes, upcoming appointments, etc.  Non-urgent messages can be sent to your provider as well.   To learn more about what you can do with MyChart, go to [email or web address].    Your next appointment:   3 month(s)  The format for your next appointment:   In Person  Provider:   Skeet Latch, MD

## 2019-10-21 NOTE — Telephone Encounter (Signed)
Enrolled patient for a 14 day Zio XT  monitor to be mailed to patients home  °

## 2019-10-21 NOTE — Assessment & Plan Note (Addendum)
On inhalers. Some increased SOB- check BNP

## 2019-10-21 NOTE — Assessment & Plan Note (Signed)
Originally diagnosed in May 2017 in the setting of Graves' disease and hyperthyroidism.  Eventually she underwent DC cardioversion in May 2018 and subsequent visits she was in normal sinus rhythm.  At some point Eliquis was discontinued, she is a [place] VASC 3.

## 2019-10-22 LAB — TSH: TSH: 6.5 u[IU]/mL — ABNORMAL HIGH (ref 0.450–4.500)

## 2019-10-22 LAB — BASIC METABOLIC PANEL
BUN/Creatinine Ratio: 24 (ref 12–28)
BUN: 18 mg/dL (ref 8–27)
CO2: 25 mmol/L (ref 20–29)
Calcium: 10.1 mg/dL (ref 8.7–10.3)
Chloride: 105 mmol/L (ref 96–106)
Creatinine, Ser: 0.74 mg/dL (ref 0.57–1.00)
GFR calc Af Amer: 94 mL/min/{1.73_m2} (ref >59)
GFR calc non Af Amer: 81 mL/min/{1.73_m2} (ref >59)
Glucose: 112 mg/dL — ABNORMAL HIGH (ref 65–99)
Potassium: 4.3 mmol/L (ref 3.5–5.2)
Sodium: 143 mmol/L (ref 134–144)

## 2019-10-22 LAB — BRAIN NATRIURETIC PEPTIDE: BNP: 30.9 pg/mL (ref 0.0–100.0)

## 2019-10-22 LAB — T4, FREE: Free T4: 1.27 ng/dL (ref 0.82–1.77)

## 2019-10-25 ENCOUNTER — Telehealth: Payer: Self-pay

## 2019-10-25 ENCOUNTER — Encounter: Payer: Self-pay | Admitting: Family Medicine

## 2019-10-25 ENCOUNTER — Ambulatory Visit: Payer: Medicare Other

## 2019-10-25 ENCOUNTER — Ambulatory Visit (INDEPENDENT_AMBULATORY_CARE_PROVIDER_SITE_OTHER): Payer: Medicare Other | Admitting: Family Medicine

## 2019-10-25 VITALS — BP 137/76 | HR 69 | Ht 62.0 in | Wt 216.0 lb

## 2019-10-25 DIAGNOSIS — M79662 Pain in left lower leg: Secondary | ICD-10-CM

## 2019-10-25 DIAGNOSIS — H5711 Ocular pain, right eye: Secondary | ICD-10-CM | POA: Diagnosis not present

## 2019-10-25 DIAGNOSIS — E8881 Metabolic syndrome: Secondary | ICD-10-CM

## 2019-10-25 DIAGNOSIS — E890 Postprocedural hypothyroidism: Secondary | ICD-10-CM

## 2019-10-25 DIAGNOSIS — R600 Localized edema: Secondary | ICD-10-CM | POA: Diagnosis not present

## 2019-10-25 LAB — POCT GLYCOSYLATED HEMOGLOBIN (HGB A1C): Hemoglobin A1C: 5.6 % (ref 4.0–5.6)

## 2019-10-25 MED ORDER — CIPROFLOXACIN HCL 0.3 % OP SOLN
2.0000 [drp] | OPHTHALMIC | 0 refills | Status: DC
Start: 2019-10-25 — End: 2019-12-26

## 2019-10-25 MED ORDER — LEVOTHYROXINE SODIUM 137 MCG PO TABS: 137.0000 ug | ORAL_TABLET | Freq: Every day | ORAL | 1 refills | Status: DC

## 2019-10-25 MED ORDER — LEVOTHYROXINE SODIUM 137 MCG PO TABS: 137.0000 ug | ORAL_TABLET | Freq: Every day | ORAL | 0 refills | Status: DC

## 2019-10-25 NOTE — Progress Notes (Signed)
Established Patient Office Visit  Subjective:  Patient ID: Martha Perkins, female    DOB: [birth year]  Age: 74 y.o. MRN: [id number]  CC:  Chief Complaint  Patient presents with  . calf pain    HPI Martha Perkins presents for a couple of concerns today.  She reports that she has had left calf pain that started Sunday morning when she got up she does remember any injury or trauma or repetitive motion that would affected the leg.  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  She is not currently on any anticoagulation she denies any shortness of breath or chest pain she says the discomfort in the posterior calf just below her knee is a little better than it was yesterday.  Also been having frequent palpitations.  She did have a consultation with cardiology they are getting her scheduled for heart monitor but they do also did some blood work which indicated that her thyroid actually may be often it may need to be adjusted.  She is also having some right medial eye pain.  She says Sunday while standing outside at church she felt like she had a sudden sharp pain in her eyes she was wondered if something flew into it.  She says it was really irritated after that and she did quite a bit of rubbing of the eye.  Now it is been crusting and draining and watering.  She said a little bit of blurry vision on and off.  It still just feels irritated.   Past Medical History:  Diagnosis Date  . Depression   . Hyperlipidemia   . Hypertension   . Hyperthyroidism 08/09/2015  . Kidney stone   . Paroxysmal atrial fibrillation ([place]) 08/09/2015  . Thyroid disease     Past Surgical History:  Procedure Laterality Date  . CARDIOVERSION N/A 08/05/2016   Procedure: CARDIOVERSION;  Surgeon: Skeet Latch, MD;  Location: [facility];  Service: Cardiovascular;  Laterality: N/A;  . CATARACT EXTRACTION Left 09/2012   Dr. Bunnie Philips  . LITHOTRIPSY    . REPLACEMENT TOTAL KNEE BILATERAL  2011  . THYROIDECTOMY  1971  . TUBAL  LIGATION  1979    Family History  Problem Relation Age of Onset  . Coronary artery disease Mother 27       deceased  . Depression Mother   . Hyperlipidemia Mother   . Diabetes Paternal Grandmother   . Thyroid disease Sister   . COPD Father     Social History   Socioeconomic History  . Marital status: Married    Spouse name: Remo Lipps  . Number of children: 2  . Years of education: 16  . Highest education level: Bachelor's degree (e.g., BA, AB, BS)  Occupational History  . Occupation: Retired Therapist, sports.   Tobacco Use  . Smoking status: Never Smoker  . Smokeless tobacco: Never Used  Vaping Use  . Vaping Use: Never used  Substance and Sexual Activity  . Alcohol use: No  . Drug use: No  . Sexual activity: Not Currently  Other Topics Concern  . Not on file  Social History Narrative   Stay around the house. Go to grocery store.    Social Determinants of Health   Financial Resource Strain:   . Difficulty of Paying Living Expenses:   Food Insecurity:   . Worried About Charity fundraiser in the Last Year:   . [employer] in the Last Year:   Transportation Needs:   . Lack of  Transportation (Medical):   Marland Kitchen Lack of Transportation (Non-Medical):   Physical Activity:   . Days of Exercise per Week:   . Minutes of Exercise per Session:   Stress:   . Feeling of Stress :   Social Connections:   . Frequency of Communication with Friends and Family:   . Frequency of Social Gatherings with Friends and Family:   . Attends Religious Services:   . Active Member of Clubs or Organizations:   . Attends Archivist Meetings:   Marland Kitchen Marital Status:   Intimate Partner Violence:   . Fear of Current or Ex-Partner:   . Emotionally Abused:   Marland Kitchen Physically Abused:   . Sexually Abused:     Outpatient Medications Prior to Visit  Medication Sig Dispense Refill  . acetaminophen (TYLENOL) 650 MG CR tablet Take 1 tablet (650 mg total) by mouth every 8 (eight) hours. 90 tablet 3  .  albuterol (VENTOLIN HFA) 108 (90 Base) MCG/ACT inhaler INHALE TWO PUFFS INTO THE LUNGS EVERY 6 HOURS AS NEEDED FOR WHEEZING OR SHORTNESS OF BREATH 8.5 g 2  . atorvastatin (LIPITOR) 40 MG tablet Take 1 tablet (40 mg total) by mouth daily. 90 tablet 3  . clotrimazole-betamethasone (LOTRISONE) cream Apply 1 application topically 2 (two) times daily. 45 g 2  . diltiazem (CARDIZEM CD) 240 MG 24 hr capsule Take 1 capsule (240 mg total) by mouth daily. 90 capsule 3  . fluticasone (FLONASE) 50 MCG/ACT nasal spray USE 1 SPRAY IN EACH NOSTRIL TWO TIMES DAILY 48 g 3  . fluticasone (FLOVENT HFA) 110 MCG/ACT inhaler Inhale 2 puffs into the lungs 2 (two) times daily. 1 Inhaler 2  . furosemide (LASIX) 20 MG tablet Take 1 tablet (20 mg total) by mouth daily. 90 tablet 3  . ketoconazole (NIZORAL) 2 % cream Apply topically daily as needed for irritation. 60 g 3  . meloxicam (MOBIC) 15 MG tablet TAKE 1 TABLET BY MOUTH  DAILY 90 tablet 3  . metoprolol succinate (TOPROL-XL) 50 MG 24 hr tablet TAKE 1 TABLET BY MOUTH  DAILY WITH OR IMMEDIATELY  FOLLOWING A MEAL 90 tablet 3  . potassium chloride (K-DUR) 10 MEQ tablet Take 1 tablet (10 mEq total) by mouth daily as needed. 30 tablet 1  . traMADol (ULTRAM) 50 MG tablet Take 1 tablet (50 mg total) by mouth 3 (three) times daily as needed. 90 tablet 0  . levothyroxine (SYNTHROID) 125 MCG tablet Take 1 tablet (125 mcg total) by mouth daily before breakfast. 90 tablet 0   No facility-administered medications prior to visit.    Allergies  Allergen Reactions  . Demerol Nausea Only  . Meperidine Hcl Other (See Comments)    Feels loopy  . Oxycodone-Aspirin Other (See Comments)    Feels loopy  . Percocet [Oxycodone-Acetaminophen] Other (See Comments)    Feels loopy    ROS Review of Systems    Objective:    Physical Exam Vitals reviewed.  Constitutional:      Appearance: She is well-developed.  HENT:     Head: Normocephalic and atraumatic.  Eyes:      Conjunctiva/sclera: Conjunctivae normal.     Comments: Medial portion of the right eye over the sclera is very injected she has some upper and lower lid edema she is got a little bit of green crusting medially.  Extraocular movements are intact pupils are equal round and reactive to light.  Fluorescein stain did not show any foreign body or corneal injury.  Cardiovascular:  Rate and Rhythm: Normal rate.  Pulmonary:     Effort: Pulmonary effort is normal.  Musculoskeletal:     Comments: Left calf is mildly tender on exam just below the knee.  She has chronic edema of that lower extremity with some thickening of the skin.  She does not wear her compression stockings.  No open wounds or drainage.  Skin:    General: Skin is dry.     Coloration: Skin is not pale.  Neurological:     Mental Status: She is alert and oriented to person, place, and time.  Psychiatric:        Behavior: Behavior normal.     BP 137/76   Pulse 69   Ht 5\' 2"  (1.575 m)   Wt 216 lb (98 kg)   SpO2 98%   BMI 39.51 kg/m  Wt Readings from Last 3 Encounters:  10/25/19 216 lb (98 kg)  10/21/19 216 lb (98 kg)  09/07/19 218 lb (98.9 kg)     Health Maintenance Due  Topic Date Due  . INFLUENZA VACCINE  10/09/2019    There are no preventive care reminders to display for this patient.  Lab Results  Component Value Date   TSH 6.500 (H) 10/21/2019   Lab Results  Component Value Date   WBC 6.2 06/30/2019   HGB 13.8 06/30/2019   HCT 41.0 06/30/2019   MCV 90.1 06/30/2019   PLT 187 06/30/2019   Lab Results  Component Value Date   NA 143 10/21/2019   K 4.3 10/21/2019   CO2 25 10/21/2019   GLUCOSE 112 (H) 10/21/2019   BUN 18 10/21/2019   CREATININE 0.74 10/21/2019   BILITOT 0.9 06/30/2019   ALKPHOS 119 08/25/2016   AST 24 06/30/2019   ALT 35 (H) 06/30/2019   PROT 7.1 06/30/2019   ALBUMIN 4.4 08/25/2016   CALCIUM 10.1 10/21/2019   Lab Results  Component Value Date   CHOL 180 06/30/2019   Lab  Results  Component Value Date   HDL 51 06/30/2019   Lab Results  Component Value Date   LDLCALC 105 (H) 06/30/2019   Lab Results  Component Value Date   TRIG 147 06/30/2019   Lab Results  Component Value Date   CHOLHDL 3.5 06/30/2019   Lab Results  Component Value Date   HGBA1C 5.6 10/25/2019      Assessment & Plan:   Problem List Items Addressed This Visit      Endocrine   Insulin resistance - Primary    Recheck hemoglobin A1c early today since her last one was significantly elevated at 6.3.  Today it looks better at 6.0 just continue to work on healthy diet and eating a low-carb low sugar diet and eating more vegetables and lean meats.      Relevant Orders   POCT glycosylated hemoglobin (Hb A1C) (Completed)   Hypothyroidism   Relevant Medications   levothyroxine (SYNTHROID) 137 MCG tablet   Other Relevant Orders   TSH + free T4    Other Visit Diagnoses    Acute right eye pain       Pain of left calf       Relevant Orders   US Venous Img Lower Unilateral Left     Acute right eye pain-suspect related to trauma or injury and excessive rubbing.  On fluorescein stain today I did not see any foreign body or or any corneal injuries.  We will treat with Cipro drops for secondary infections and she does have  evidence of green discharge.  If she is not improving over the next couple of days, develops new symptoms, or has any vision changes then recommend that she follow-up with her eye doctor immediately.  Left calf pain-with prior history of DVT recommend Doppler just to rule out DVT.  She is feeling a little bit better today so certainly it could just be a strain she has chronic lower extremity edema so it is difficult to tell if she has any new onset swelling or not.   Meds ordered this encounter  Medications  . ciprofloxacin (CILOXAN) 0.3 % ophthalmic solution    Sig: Place 2 drops into the right eye every 2 (two) hours. Administer 1 drop, every 2 hours, while awake,  for 2 days. Then 1 drop, every 4 hours, while awake, for the next 5 days.    Dispense:  5 mL    Refill:  0  . DISCONTD: levothyroxine (SYNTHROID) 137 MCG tablet    Sig: Take 1 tablet (137 mcg total) by mouth daily before breakfast.    Dispense:  30 tablet    Refill:  1  . levothyroxine (SYNTHROID) 137 MCG tablet    Sig: Take 1 tablet (137 mcg total) by mouth daily before breakfast.    Dispense:  90 tablet    Refill:  0    Follow-up: No follow-ups on file.    Spent in encounter 35 minutes.  Beatrice Lecher, MD

## 2019-10-25 NOTE — Progress Notes (Signed)
L eye pain she reports that it felt like something flew into her eye. She had an eye exam 2 weeks ago which was fine.  Her L eye is red, watery,and painful. She states that it does get some crusty build up in the mornings. She has been using warm compresses,eye drops for this

## 2019-10-25 NOTE — Patient Instructions (Signed)
Check thyroid again in 6 weeks.

## 2019-10-25 NOTE — Telephone Encounter (Signed)
Martha Perkins states she need clarification for her eye drops. She states the bottle reads multiple directions. Please advise.

## 2019-10-25 NOTE — Telephone Encounter (Signed)
It should say:  Administer 1 drop, every 2 hours, while awake, for 2 days.   Then 1 drop, every 4 hours, while awake, for the next 5 days

## 2019-10-25 NOTE — Assessment & Plan Note (Signed)
Recheck hemoglobin A1c early today since her last one was significantly elevated at 6.3.  Today it looks better at 6.0 just continue to work on healthy diet and eating a low-carb low sugar diet and eating more vegetables and lean meats.

## 2019-10-26 NOTE — Telephone Encounter (Signed)
Patient advised.

## 2019-10-29 ENCOUNTER — Other Ambulatory Visit (INDEPENDENT_AMBULATORY_CARE_PROVIDER_SITE_OTHER): Payer: Medicare Other

## 2019-10-29 DIAGNOSIS — I480 Paroxysmal atrial fibrillation: Secondary | ICD-10-CM

## 2019-10-29 DIAGNOSIS — R002 Palpitations: Secondary | ICD-10-CM

## 2019-10-29 DIAGNOSIS — R0602 Shortness of breath: Secondary | ICD-10-CM

## 2019-11-01 ENCOUNTER — Other Ambulatory Visit: Payer: Self-pay

## 2019-11-01 DIAGNOSIS — M5136 Other intervertebral disc degeneration, lumbar region: Secondary | ICD-10-CM

## 2019-11-01 DIAGNOSIS — G8929 Other chronic pain: Secondary | ICD-10-CM

## 2019-11-01 MED ORDER — TRAMADOL HCL 50 MG PO TABS: 50.0000 mg | ORAL_TABLET | Freq: Three times a day (TID) | ORAL | 0 refills | Status: DC | PRN

## 2019-11-01 NOTE — Telephone Encounter (Signed)
Pharmacy called for a switch in pharmacy. They requested the Tramadol.

## 2019-11-08 ENCOUNTER — Other Ambulatory Visit: Payer: Self-pay | Admitting: Family Medicine

## 2019-11-08 DIAGNOSIS — I10 Essential (primary) hypertension: Secondary | ICD-10-CM

## 2019-11-18 DIAGNOSIS — I480 Paroxysmal atrial fibrillation: Secondary | ICD-10-CM | POA: Diagnosis not present

## 2019-11-29 ENCOUNTER — Other Ambulatory Visit: Payer: Self-pay | Admitting: Family Medicine

## 2019-12-01 ENCOUNTER — Telehealth: Payer: Self-pay | Admitting: Family Medicine

## 2019-12-01 NOTE — Telephone Encounter (Signed)
I was rescheduling an appt for pt and she was inquiring about some results. She recently had a Zio XT monitor for 14 days and was wondering if pcp had received any results.

## 2019-12-02 NOTE — Telephone Encounter (Signed)
Called pt and her VM is full.

## 2019-12-05 ENCOUNTER — Other Ambulatory Visit: Payer: Self-pay | Admitting: Family Medicine

## 2019-12-05 DIAGNOSIS — G8929 Other chronic pain: Secondary | ICD-10-CM

## 2019-12-05 DIAGNOSIS — M5136 Other intervertebral disc degeneration, lumbar region: Secondary | ICD-10-CM

## 2019-12-09 ENCOUNTER — Other Ambulatory Visit: Payer: Self-pay | Admitting: Family Medicine

## 2019-12-09 DIAGNOSIS — Z1231 Encounter for screening mammogram for malignant neoplasm of breast: Secondary | ICD-10-CM

## 2019-12-26 ENCOUNTER — Encounter: Payer: Self-pay | Admitting: Family Medicine

## 2019-12-26 ENCOUNTER — Other Ambulatory Visit: Payer: Self-pay | Admitting: Family Medicine

## 2019-12-26 ENCOUNTER — Ambulatory Visit (INDEPENDENT_AMBULATORY_CARE_PROVIDER_SITE_OTHER): Payer: Medicare Other | Admitting: Family Medicine

## 2019-12-26 VITALS — BP 138/72 | HR 68 | Ht 62.0 in | Wt 212.0 lb

## 2019-12-26 DIAGNOSIS — H6122 Impacted cerumen, left ear: Secondary | ICD-10-CM | POA: Diagnosis not present

## 2019-12-26 DIAGNOSIS — M5136 Other intervertebral disc degeneration, lumbar region: Secondary | ICD-10-CM

## 2019-12-26 DIAGNOSIS — R002 Palpitations: Secondary | ICD-10-CM

## 2019-12-26 DIAGNOSIS — E890 Postprocedural hypothyroidism: Secondary | ICD-10-CM

## 2019-12-26 DIAGNOSIS — I700 Atherosclerosis of aorta: Secondary | ICD-10-CM | POA: Diagnosis not present

## 2019-12-26 DIAGNOSIS — I10 Essential (primary) hypertension: Secondary | ICD-10-CM

## 2019-12-26 DIAGNOSIS — Z23 Encounter for immunization: Secondary | ICD-10-CM

## 2019-12-26 DIAGNOSIS — R21 Rash and other nonspecific skin eruption: Secondary | ICD-10-CM

## 2019-12-26 DIAGNOSIS — G8929 Other chronic pain: Secondary | ICD-10-CM

## 2019-12-26 MED ORDER — KETOCONAZOLE 2 % EX CREA: TOPICAL_CREAM | Freq: Every day | CUTANEOUS | 3 refills | Status: DC | PRN

## 2019-12-26 MED ORDER — TRIAMCINOLONE ACETONIDE 0.1 % EX CREA: 1.0000 "application " | TOPICAL_CREAM | Freq: Two times a day (BID) | CUTANEOUS | 0 refills | Status: DC | PRN

## 2019-12-26 MED ORDER — TRAMADOL HCL 50 MG PO TABS: 50.0000 mg | ORAL_TABLET | Freq: Three times a day (TID) | ORAL | 0 refills | Status: DC | PRN

## 2019-12-26 MED ORDER — FUROSEMIDE 20 MG PO TABS
40.0000 mg | ORAL_TABLET | Freq: Every day | ORAL | 0 refills | Status: DC
Start: 2019-12-26 — End: 2020-03-26

## 2019-12-26 NOTE — Assessment & Plan Note (Addendum)
Currently on 90 tabs per month of tramadol.  Due for her refill later this week.

## 2019-12-26 NOTE — Assessment & Plan Note (Signed)
Due to recheck TSH.  We adjusted her dose about 8 weeks ago.  She still feels like her hair is more brittle.  Lab Results  Component Value Date   TSH 6.500 (H) 10/21/2019

## 2019-12-26 NOTE — Assessment & Plan Note (Signed)
Asymptomatic.  Currently on a statin and tolerating well.

## 2019-12-26 NOTE — Assessment & Plan Note (Signed)
Reports that she still having some palpitations but they are not as frequent as they previously were.

## 2019-12-26 NOTE — Assessment & Plan Note (Signed)
Well controlled. Continue current regimen. Follow up in  4 months.   

## 2019-12-26 NOTE — Progress Notes (Signed)
Established Patient Office Visit  Subjective:  Patient ID: Martha Perkins, female    DOB: 1946-01-22  Age: 74 y.o. MRN: [id number]  CC:  Chief Complaint  Patient presents with  . Hypertension  . Hypothyroidism    HPI Martha Perkins presents for   Hypertension- Pt denies chest pain, SOB, dizziness, or heart palpitations.  Taking meds as directed w/o problems.  Denies medication side effects.  She has been taking 2 tabs of Lasix daily she feels like that works better for her.  Hypothyroidism - Taking medication regularly in the AM away from food and vitamins, etc. No recent change to skin, hair, or energy levels.  Last A1c in August was 5.6 which looks great.  She is also had some pain in her left ear.  She had used one of the new corkscrew devices to try to remove some wax a few days ago and then over the weekend it became painful she has not noticed any discharge or drainage.  No hearing loss.\  For her lower extremity swelling she is actually been taking 2 Lasix daily.  She feels like that is more effective than once daily.  Due to check potassium levels.  Past Medical History:  Diagnosis Date  . Depression   . Hyperlipidemia   . Hypertension   . Hyperthyroidism 08/09/2015  . Kidney stone   . Paroxysmal atrial fibrillation ([place]) 08/09/2015  . Thyroid disease     Past Surgical History:  Procedure Laterality Date  . CARDIOVERSION N/A 08/05/2016   Procedure: CARDIOVERSION;  Surgeon: Skeet Latch, MD;  Location: [facility];  Service: Cardiovascular;  Laterality: N/A;  . CATARACT EXTRACTION Left 09/2012   Dr. Bunnie Philips  . LITHOTRIPSY    . REPLACEMENT TOTAL KNEE BILATERAL  2011  . THYROIDECTOMY  1971  . TUBAL LIGATION  1979    Family History  Problem Relation Age of Onset  . Coronary artery disease Mother 30       deceased  . Depression Mother   . Hyperlipidemia Mother   . Diabetes Paternal Grandmother   . Thyroid disease Sister   . COPD Father     Social  History   Socioeconomic History  . Marital status: Married    Spouse name: Remo Lipps  . Number of children: 2  . Years of education: 16  . Highest education level: Bachelor's degree (e.g., BA, AB, BS)  Occupational History  . Occupation: Retired Therapist, sports.   Tobacco Use  . Smoking status: Never Smoker  . Smokeless tobacco: Never Used  Vaping Use  . Vaping Use: Never used  Substance and Sexual Activity  . Alcohol use: No  . Drug use: No  . Sexual activity: Not Currently  Other Topics Concern  . Not on file  Social History Narrative   Stay around the house. Go to grocery store.    Social Determinants of Health   Financial Resource Strain:   . Difficulty of Paying Living Expenses: Not on file  Food Insecurity:   . Worried About Charity fundraiser in the Last Year: Not on file  . Ran Out of Food in the Last Year: Not on file  Transportation Needs:   . Lack of Transportation (Medical): Not on file  . Lack of Transportation (Non-Medical): Not on file  Physical Activity:   . Days of Exercise per Week: Not on file  . Minutes of Exercise per Session: Not on file  Stress:   . Feeling of Stress : Not on file  Social Connections:   . Frequency of Communication with Friends and Family: Not on file  . Frequency of Social Gatherings with Friends and Family: Not on file  . Attends Religious Services: Not on file  . Active Member of Clubs or Organizations: Not on file  . Attends Archivist Meetings: Not on file  . Marital Status: Not on file  Intimate Partner Violence:   . Fear of Current or Ex-Partner: Not on file  . Emotionally Abused: Not on file  . Physically Abused: Not on file  . Sexually Abused: Not on file    Outpatient Medications Prior to Visit  Medication Sig Dispense Refill  . acetaminophen (TYLENOL) 650 MG CR tablet Take 1 tablet (650 mg total) by mouth every 8 (eight) hours. 90 tablet 3  . albuterol (VENTOLIN HFA) 108 (90 Base) MCG/ACT inhaler INHALE TWO PUFFS  INTO THE LUNGS EVERY 6 HOURS AS NEEDED FOR WHEEZING OR SHORTNESS OF BREATH 8.5 g 2  . atorvastatin (LIPITOR) 40 MG tablet TAKE 1 TABLET BY MOUTH  DAILY 90 tablet 1  . CARTIA XT 240 MG 24 hr capsule TAKE 1 CAPSULE BY MOUTH  DAILY 90 capsule 3  . clotrimazole-betamethasone (LOTRISONE) cream Apply 1 application topically 2 (two) times daily. 45 g 2  . fluticasone (FLONASE) 50 MCG/ACT nasal spray USE 1 SPRAY IN EACH NOSTRIL TWO TIMES DAILY 48 g 3  . fluticasone (FLOVENT HFA) 110 MCG/ACT inhaler Inhale 2 puffs into the lungs 2 (two) times daily. 1 Inhaler 2  . levothyroxine (SYNTHROID) 137 MCG tablet TAKE 1 TABLET BY MOUTH  DAILY BEFORE BREAKFAST 90 tablet 3  . meloxicam (MOBIC) 15 MG tablet TAKE 1 TABLET BY MOUTH  DAILY 90 tablet 3  . metoprolol succinate (TOPROL-XL) 50 MG 24 hr tablet TAKE 1 TABLET BY MOUTH  DAILY WITH OR IMMEDIATELY  FOLLOWING A MEAL 90 tablet 3  . potassium chloride (K-DUR) 10 MEQ tablet Take 1 tablet (10 mEq total) by mouth daily as needed. 30 tablet 1  . ciprofloxacin (CILOXAN) 0.3 % ophthalmic solution Place 2 drops into the right eye every 2 (two) hours. Administer 1 drop, every 2 hours, while awake, for 2 days. Then 1 drop, every 4 hours, while awake, for the next 5 days. 5 mL 0  . furosemide (LASIX) 20 MG tablet Take 1 tablet (20 mg total) by mouth daily. 90 tablet 3  . ketoconazole (NIZORAL) 2 % cream Apply topically daily as needed for irritation. 60 g 3  . traMADol (ULTRAM) 50 MG tablet Take 1 tablet (50 mg total) by mouth 3 (three) times daily as needed. 90 tablet 0   No facility-administered medications prior to visit.    Allergies  Allergen Reactions  . Demerol Nausea Only  . Meperidine Hcl Other (See Comments)    Feels loopy  . Oxycodone-Aspirin Other (See Comments)    Feels loopy  . Percocet [Oxycodone-Acetaminophen] Other (See Comments)    Feels loopy    ROS Review of Systems    Objective:    Physical Exam Constitutional:      Appearance: She is  well-developed.  HENT:     Head: Normocephalic and atraumatic.     Right Ear: Tympanic membrane, ear canal and external ear normal.     Left Ear: External ear normal.     Ears:     Comments: Left canal blocked about 90% percent by cerumen.  Tried to manually disimpact the ear and this was not successful so switched to irrigation.  Cardiovascular:     Rate and Rhythm: Normal rate and regular rhythm.     Heart sounds: Normal heart sounds.  Pulmonary:     Effort: Pulmonary effort is normal.     Breath sounds: Normal breath sounds.  Skin:    General: Skin is warm and dry.  Neurological:     Mental Status: She is alert and oriented to person, place, and time.  Psychiatric:        Behavior: Behavior normal.     BP 138/72   Pulse 68   Ht 5\' 2"  (1.575 m)   Wt 212 lb (96.2 kg)   SpO2 98%   BMI 38.78 kg/m  Wt Readings from Last 3 Encounters:  12/26/19 212 lb (96.2 kg)  10/25/19 216 lb (98 kg)  10/21/19 216 lb (98 kg)     There are no preventive care reminders to display for this patient.  There are no preventive care reminders to display for this patient.  Lab Results  Component Value Date   TSH 6.500 (H) 10/21/2019   Lab Results  Component Value Date   WBC 6.2 06/30/2019   HGB 13.8 06/30/2019   HCT 41.0 06/30/2019   MCV 90.1 06/30/2019   PLT 187 06/30/2019   Lab Results  Component Value Date   NA 143 10/21/2019   K 4.3 10/21/2019   CO2 25 10/21/2019   GLUCOSE 112 (H) 10/21/2019   BUN 18 10/21/2019   CREATININE 0.74 10/21/2019   BILITOT 0.9 06/30/2019   ALKPHOS 119 08/25/2016   AST 24 06/30/2019   ALT 35 (H) 06/30/2019   PROT 7.1 06/30/2019   ALBUMIN 4.4 08/25/2016   CALCIUM 10.1 10/21/2019   Lab Results  Component Value Date   CHOL 180 06/30/2019   Lab Results  Component Value Date   HDL 51 06/30/2019   Lab Results  Component Value Date   LDLCALC 105 (H) 06/30/2019   Lab Results  Component Value Date   TRIG 147 06/30/2019   Lab Results   Component Value Date   CHOLHDL 3.5 06/30/2019   Lab Results  Component Value Date   HGBA1C 5.6 10/25/2019      Assessment & Plan:   Problem List Items Addressed This Visit      Cardiovascular and Mediastinum   Essential hypertension - Primary    Well controlled. Continue current regimen. Follow up in  4 months.       Relevant Medications   furosemide (LASIX) 20 MG tablet   Other Relevant Orders   BASIC METABOLIC PANEL WITH GFR   Aortic atherosclerosis ([facility])    Asymptomatic.  Currently on a statin and tolerating well.      Relevant Medications   furosemide (LASIX) 20 MG tablet     Endocrine   Hypothyroidism    Due to recheck TSH.  We adjusted her dose about 8 weeks ago.  She still feels like her hair is more brittle.  Lab Results  Component Value Date   TSH 6.500 (H) 10/21/2019         Relevant Orders   TSH     Musculoskeletal and Integument   Lumbar degenerative disc disease   Relevant Medications   traMADol (ULTRAM) 50 MG tablet (Start on 01/02/2020)     Other   Palpitations    Reports that she still having some palpitations but they are not as frequent as they previously were.      Encounter for chronic pain management    Currently  on 90 tabs per month of tramadol.  Due for her refill later this week.      Relevant Medications   traMADol (ULTRAM) 50 MG tablet (Start on 01/02/2020)    Other Visit Diagnoses    Need for immunization against influenza       Relevant Orders   Flu Vaccine QUAD High Dose(Fluad) (Completed)   Groin rash       Relevant Medications   triamcinolone cream (KENALOG) 0.1 %   Impacted cerumen of left ear         Flu vaccine given today.    Indication: Cerumen impaction of the left ear  Medical necessity statement: On physical examination, cerumen impairs clinically significant portions of the external auditory canal, and tympanic membrane. Noted obstructive, copious cerumen that cannot be removed without magnification  and instrumentations requiring physician skills Consent: Discussed benefits and risks of procedure and verbal consent obtained Procedure: Patient was prepped for the procedure. Utilized an otoscope to assess and take note of the ear canal, the tympanic membrane, and the presence, amount, and placement of the cerumen. Gentle water irrigation and soft plastic curette was utilized to remove cerumen.  Post procedure examination: shows cerumen was completely removed. Patient tolerated procedure well. The patient is made aware that they may experience temporary vertigo, temporary hearing loss, and temporary discomfort. If these symptom last for more than 24 hours to call the clinic or proceed to the ED.    Meds ordered this encounter  Medications  . ketoconazole (NIZORAL) 2 % cream    Sig: Apply topically daily as needed for irritation.    Dispense:  60 g    Refill:  3  . furosemide (LASIX) 20 MG tablet    Sig: Take 2 tablets (40 mg total) by mouth daily.    Dispense:  1 tablet    Refill:  0    NEW DOSE  . triamcinolone cream (KENALOG) 0.1 %    Sig: Apply 1 application topically 2 (two) times daily as needed.    Dispense:  45 g    Refill:  0  . traMADol (ULTRAM) 50 MG tablet    Sig: Take 1 tablet (50 mg total) by mouth 3 (three) times daily as needed.    Dispense:  90 tablet    Refill:  0    Follow-up: Return if symptoms worsen or fail to improve.    Beatrice Lecher, MD

## 2019-12-27 LAB — BASIC METABOLIC PANEL WITH GFR
BUN: 20 mg/dL (ref 7–25)
CO2: 29 mmol/L (ref 20–32)
Calcium: 10.3 mg/dL (ref 8.6–10.4)
Chloride: 102 mmol/L (ref 98–110)
Creat: 0.75 mg/dL (ref 0.60–0.93)
GFR, Est African American: 92 mL/min/{1.73_m2} (ref >=60)
GFR, Est Non African American: 80 mL/min/{1.73_m2} (ref >=60)
Glucose, Bld: 89 mg/dL (ref 65–139)
Potassium: 4.2 mmol/L (ref 3.5–5.3)
Sodium: 141 mmol/L (ref 135–146)

## 2019-12-27 LAB — TSH: TSH: 5.95 mIU/L — ABNORMAL HIGH (ref 0.40–4.50)

## 2019-12-28 ENCOUNTER — Telehealth: Payer: Self-pay

## 2019-12-28 DIAGNOSIS — E890 Postprocedural hypothyroidism: Secondary | ICD-10-CM

## 2019-12-28 NOTE — Telephone Encounter (Signed)
Martha Perkins called and left a message with current Levothyroxine dosing.   Levothyroxine 137 mcg daily.

## 2019-12-29 ENCOUNTER — Ambulatory Visit: Payer: Medicare Other | Admitting: Family Medicine

## 2019-12-30 ENCOUNTER — Other Ambulatory Visit: Payer: Self-pay

## 2019-12-30 ENCOUNTER — Ambulatory Visit
Admission: RE | Admit: 2019-12-30 | Discharge: 2019-12-30 | Disposition: A | Discharge status: Home / Self Care | Payer: Medicare Other | Source: Ambulatory Visit | Attending: Family Medicine | Admitting: Family Medicine

## 2019-12-30 DIAGNOSIS — Z1231 Encounter for screening mammogram for malignant neoplasm of breast: Secondary | ICD-10-CM | POA: Diagnosis not present

## 2019-12-30 MED ORDER — LEVOTHYROXINE SODIUM 150 MCG PO TABS: 150.0000 ug | ORAL_TABLET | Freq: Every day | ORAL | 0 refills | Status: DC

## 2019-12-30 NOTE — Telephone Encounter (Signed)
Called and advised patient. RX sent to mail order per pt request. Future lab ordered

## 2019-12-30 NOTE — Telephone Encounter (Signed)
OK will bump dose to 169mcg daily.  New rx pended. Not sure which pharmacy to use. Plan to recheck TSHin 6 weeks.

## 2020-01-17 ENCOUNTER — Other Ambulatory Visit: Payer: Self-pay | Admitting: Family Medicine

## 2020-01-23 ENCOUNTER — Other Ambulatory Visit: Payer: Self-pay | Admitting: Family Medicine

## 2020-01-30 ENCOUNTER — Other Ambulatory Visit: Payer: Self-pay

## 2020-01-30 ENCOUNTER — Ambulatory Visit: Payer: Medicare Other | Admitting: Cardiovascular Disease

## 2020-01-30 ENCOUNTER — Encounter: Payer: Self-pay | Admitting: Cardiovascular Disease

## 2020-01-30 VITALS — BP 140/68 | HR 71 | Ht 62.0 in | Wt 218.0 lb

## 2020-01-30 DIAGNOSIS — R002 Palpitations: Secondary | ICD-10-CM | POA: Diagnosis not present

## 2020-01-30 DIAGNOSIS — I480 Paroxysmal atrial fibrillation: Secondary | ICD-10-CM | POA: Diagnosis not present

## 2020-01-30 DIAGNOSIS — Z8679 Personal history of other diseases of the circulatory system: Secondary | ICD-10-CM | POA: Diagnosis not present

## 2020-01-30 DIAGNOSIS — E785 Hyperlipidemia, unspecified: Secondary | ICD-10-CM

## 2020-01-30 DIAGNOSIS — I10 Essential (primary) hypertension: Secondary | ICD-10-CM | POA: Diagnosis not present

## 2020-01-30 NOTE — Patient Instructions (Signed)
Medication Instructions:  Your physician recommends that you continue on your current medications as directed. Please refer to the Current Medication list given to you today.  *If you need a refill on your cardiac medications before your next appointment, please call your pharmacy*  Lab Work: NONE  Testing/Procedures: NONE  Follow-Up: At [facility], you and your health needs are our priority.  As part of our continuing mission to provide you with exceptional heart care, we have created designated Provider Care Teams.  These Care Teams include your primary Cardiologist (physician) and Advanced Practice Providers (APPs -  Physician Assistants and Nurse Practitioners) who all work together to provide you with the care you need, when you need it.  We recommend signing up for the patient portal called "MyChart".  Sign up information is provided on this After Visit Summary.  MyChart is used to connect with patients for Virtual Visits (Telemedicine).  Patients are able to view lab/test results, encounter notes, upcoming appointments, etc.  Non-urgent messages can be sent to your provider as well.   To learn more about what you can do with MyChart, go to [email or web address].    Your next appointment:   6 month(s)  You will receive a reminder letter in the mail two months in advance. If you don't receive a letter, please call our office to schedule the follow-up appointment.   The format for your next appointment:   In Person  Provider:   You may see DR [facility]  or one of the following Advanced Practice Providers on your designated Care Team:    [name], PA-C  [place], [place]  Coletta Memos, [place]  Other Instructions MONITOR YOUR BLOOD PRESSURE AT HOME, CALL THE OFFICE IF IT IS CONSISTENTLY ABOVE 130/80

## 2020-01-30 NOTE — Progress Notes (Signed)
Cardiology Office Note   Date:  01/30/2020   ID:  Martha Perkins, DOB [birth year], MRN [id number]  PCP:  Hali Marry, MD  Cardiologist:   Skeet Latch, MD   No chief complaint on file.    History of Present Illness: Martha Perkins is a 74 y.o. female with paroxysmal atrial fibrillation, Grave's disease, hypertension, white coat hypertension, hyperlipidemia, and fatty liver disease who presents follow up on atrial fibrillation.  Martha Perkins was seen by her PCP, [name], on 07/13/15 for newly-diagnosed atrial fibrillation.  She was started on Eliquis and a low-dose beta blocker. Lab work revealed that she was hyperthyroid.  She had a thyroid uptake scan that revealed an enlarged, hyperfunctioning left lobe of the thyroid gland without discrete nodule. Findings were thought to be consistent with Graves' disease. She also underwent radioactive iodine therapy. She was seen in clinic 08/09/15 and metoprolol was increased.  Lisinopril was discontinued. She had an echo 08/2015 that revealed LVEF 50-55% with moderate left atrial enlargement, mild MR and TR.  Martha Perkins underwent cardioversion on 08/05/16.    Since her last appointment Ms. Creswell has been doing well.  She has noticed a few palpitations recently.  The episodes occur 2 or 3 times per week and last for couple seconds at a time.  There hasn't been any sustained arrhythmia.  She denies any chest pain.  She was started on Flovent inhaler because she was having to use her albuterol more frequently.  She thinks that this is helping her breathing has improved.  She is not getting much exercise lately with plans to start walking more now that the weather is improving.  She does have some lower extremity edema that seems to be worse by the end of the day.  She denies orthopnea or PND.  Currently she uses her Lasix very rarely.  She has not been able to wear compression stockings.    She has been struggling with pain in her  shoulders.  This morning she became unable to move her R shoulder.  She had injections with her PCP that help for a while.  She had severe, burning pain after one injection.  She thinks that this is why her BP is so high today.  She is hesitant to have surgery again.  She saw [name] 10/2019 and noted some palpitations.  She wore a monitor that showed short runs of SVT but no atrial fibrillation.  She continues to have dizziness and gets occasional heaviness in her chest at rest.  She attributes all this to her palpitatoins.  She has been working with her PCP on her thyroid and her palpitations have improved since this was treated.  She was out of her levothyroxine due to [employer] and her symptoms are worse.  She has been walking every other step.  Step count 1500 to 3000 steps.  She gets short of breath with exertion.  It has improved with Flovent.   Past Medical History:  Diagnosis Date  . Depression   . Hyperlipidemia   . Hypertension   . Hyperthyroidism 08/09/2015  . Kidney stone   . Paroxysmal atrial fibrillation ([place]) 08/09/2015  . Thyroid disease     Past Surgical History:  Procedure Laterality Date  . CARDIOVERSION N/A 08/05/2016   Procedure: CARDIOVERSION;  Surgeon: Skeet Latch, MD;  Location: [place];  Service: Cardiovascular;  Laterality: N/A;  . CATARACT EXTRACTION Left 09/2012   Dr. Bunnie Philips  . LITHOTRIPSY    .  REPLACEMENT TOTAL KNEE BILATERAL  2011  . THYROIDECTOMY  1971  . TUBAL LIGATION  1979     Current Outpatient Medications  Medication Sig Dispense Refill  . acetaminophen (TYLENOL) 650 MG CR tablet Take 1 tablet (650 mg total) by mouth every 8 (eight) hours. 90 tablet 3  . albuterol (VENTOLIN HFA) 108 (90 Base) MCG/ACT inhaler INHALE TWO PUFFS INTO THE LUNGS EVERY 6 HOURS AS NEEDED FOR WHEEZING OR SHORTNESS OF BREATH 8.5 g 2  . atorvastatin (LIPITOR) 40 MG tablet TAKE 1 TABLET BY MOUTH  DAILY 90 tablet 1  . CARTIA XT 240 MG 24 hr capsule TAKE 1 CAPSULE BY  MOUTH  DAILY 90 capsule 3  . clotrimazole-betamethasone (LOTRISONE) cream Apply 1 application topically 2 (two) times daily. 45 g 2  . fluticasone (FLONASE) 50 MCG/ACT nasal spray USE 1 SPRAY IN BOTH  NOSTRILS TWICE DAILY 48 g 3  . fluticasone (FLOVENT HFA) 110 MCG/ACT inhaler Inhale 2 puffs into the lungs 2 (two) times daily. 1 Inhaler 2  . furosemide (LASIX) 20 MG tablet Take 2 tablets (40 mg total) by mouth daily. 1 tablet 0  . ketoconazole (NIZORAL) 2 % cream Apply topically daily as needed for irritation. 60 g 3  . levothyroxine (SYNTHROID) 150 MCG tablet Take 1 tablet (150 mcg total) by mouth daily before breakfast. 90 tablet 0  . meloxicam (MOBIC) 15 MG tablet TAKE 1 TABLET BY MOUTH  DAILY 90 tablet 3  . metoprolol succinate (TOPROL-XL) 50 MG 24 hr tablet TAKE 1 TABLET BY MOUTH  DAILY WITH OR IMMEDIATELY  FOLLOWING A MEAL 90 tablet 3  . potassium chloride (K-DUR) 10 MEQ tablet Take 1 tablet (10 mEq total) by mouth daily as needed. 30 tablet 1  . traMADol (ULTRAM) 50 MG tablet Take 1 tablet (50 mg total) by mouth 3 (three) times daily as needed. 90 tablet 0  . triamcinolone cream (KENALOG) 0.1 % Apply 1 application topically 2 (two) times daily as needed. 45 g 0   No current facility-administered medications for this visit.    Allergies:   Demerol, Meperidine hcl, Oxycodone-aspirin, and Percocet [oxycodone-acetaminophen]    Social History:  The patient  reports that she has never smoked. She has never used smokeless tobacco. She reports that she does not drink alcohol and does not use drugs.   Family History:  The patient's family history includes COPD in her father; Coronary artery disease (age of onset: 38) in her mother; Depression in her mother; Diabetes in her paternal grandmother; Hyperlipidemia in her mother; Thyroid disease in her sister.    ROS:  Please see the history of present illness.   Otherwise, review of systems are positive for none.   All other systems are reviewed and  negative.    PHYSICAL EXAM: VS:  BP 140/68   Pulse 71   Ht 5\' 2"  (1.575 m)   Wt 218 lb (98.9 kg)   SpO2 98%   BMI 39.87 kg/m  , BMI Body mass index is 39.87 kg/m. GENERAL:  Well appearing.  No acute distress HEENT:  Pupils equal round and reactive, fundi not visualized, oral mucosa unremarkable NECK:  No jugular venous distention, waveform within normal limits, carotid upstroke brisk and symmetric, no bruits LUNGS:  Clear to auscultation bilaterally HEART:  RRR.  PMI not displaced or sustained,S1 and S2 within normal limits, no S3, no S4, no clicks, no rubs, I/VI systolic murmur at LUSB ABD:  Flat, positive bowel sounds normal in frequency in pitch,  no bruits, no rebound, no guarding, no midline pulsatile mass, no hepatomegaly, no splenomegaly EXT:  2 plus pulses throughout, 2+ tense LE edema bilatearlly, no cyanosis no clubbing SKIN:  Erythematous stasis dermatitis on the anterior tibia bilaterally NEURO:  Cranial nerves II through XII grossly intact, motor grossly intact throughout PSYCH:  Cognitively intact, oriented to person place and time  EKG:  EKG is ordered today. The ekg ordered 12/11/15 demonstatrial fibrillation. Rate 64 bpm. PVC. Prior septal infarct.  Right axis deviation. 07/18/16: Atrial fibrillation.  Rate 80 bpm.    09/23/16:  Sinus bradycardia. Rate 58 bpm. Septal infarct. 12/21/18: Sinus rhythm.  Rate 65 bpm.  Poor R wave progression. 06/03/2019: Sinus rhythm.  Rate 65 bpm.  First-degree AV block.  Recent Labs: 06/30/2019: ALT 35; Hemoglobin 13.8; Platelets 187 10/21/2019: BNP 30.9 12/26/2019: BUN 20; Creat 0.75; Potassium 4.2; Sodium 141; TSH 5.95    Lipid Panel    Component Value Date/Time   CHOL 180 06/30/2019 1127   TRIG 147 06/30/2019 1127   HDL 51 06/30/2019 1127   CHOLHDL 3.5 06/30/2019 1127   VLDL 34 (H) 07/02/2015 0959   LDLCALC 105 (H) 06/30/2019 1127      Wt Readings from Last 3 Encounters:  01/30/20 218 lb (98.9 kg)  12/26/19 212 lb (96.2  kg)  10/25/19 216 lb (98 kg)      ASSESSMENT AND PLAN:  # Shortness of breath: Symptoms have improved since starting the Flovent inhaler.  She was encouraged to increase her exercise to 150 minutes weekly.  # Atrial fibrillation: Ms. Ravert remains in sinus rhythm.  She has had some short fluttering episodes but no sustained arrhythmia. Continue diltiazem and metoprolol.  It is unclear when she stopped Eliquis.  This patients CHA2DS2-VASc Score and unadjusted Ischemic Stroke Rate (% per year) is equal to 3.2 % stroke rate/year from a score of 3  Above score calculated as 1 point each if present [CHF, HTN, DM, Vascular=MI/PAD/Aortic Plaque, Age if 65-74, or Female] Above score calculated as 2 points each if present [Age > 75, or Stroke/TIA/TE]  # Hypertension:  Blood pressure has generally been well have been controlled.  However today she is in a lot of pain due to her shoulder.  Asked her to check it at home and let us know if is not running under 130/80.  It did improve slightly to 140/68 on repeat.  Continue diltiazem, metoprolol, and furosemide.  Could consider switching metoprolol to carvedilol if needed for additional blood pressure control.  # LE edema: Improved since starting Lasix.  She can take an extra 20 mg as needed.  # Hyperlipidemia: Continue atorvastatin 40mg  daily.  Lipids followed by PCP  # Morbid obseity: Continue working on diet and exercise.   Current medicines are reviewed at length with the patient today.  The patient does not have concerns regarding medicines.  The following changes have been made: none  Labs/ tests ordered today include:   No orders of the defined types were placed in this encounter.    Disposition:   FU with Cowen Pesqueira C. Oval Linsey, MD, [facility] in 6 months   This note was written with the assistance of speech recognition software.  Please excuse any transcriptional errors.  Signed, Maleny Candy C. Oval Linsey, MD, [facility]  01/30/2020 12:07 PM      [place]

## 2020-02-06 ENCOUNTER — Other Ambulatory Visit: Payer: Self-pay | Admitting: Family Medicine

## 2020-02-06 DIAGNOSIS — G8929 Other chronic pain: Secondary | ICD-10-CM

## 2020-02-06 DIAGNOSIS — M5136 Other intervertebral disc degeneration, lumbar region: Secondary | ICD-10-CM

## 2020-03-08 ENCOUNTER — Other Ambulatory Visit: Payer: Self-pay | Admitting: Family Medicine

## 2020-03-08 DIAGNOSIS — G8929 Other chronic pain: Secondary | ICD-10-CM

## 2020-03-08 DIAGNOSIS — M5136 Other intervertebral disc degeneration, lumbar region: Secondary | ICD-10-CM

## 2020-03-18 ENCOUNTER — Other Ambulatory Visit: Payer: Self-pay | Admitting: Family Medicine

## 2020-03-18 DIAGNOSIS — E890 Postprocedural hypothyroidism: Secondary | ICD-10-CM

## 2020-03-25 ENCOUNTER — Other Ambulatory Visit: Payer: Self-pay | Admitting: Cardiovascular Disease

## 2020-04-02 ENCOUNTER — Other Ambulatory Visit: Payer: Self-pay | Admitting: Family Medicine

## 2020-04-12 ENCOUNTER — Other Ambulatory Visit: Payer: Self-pay

## 2020-04-12 ENCOUNTER — Encounter: Payer: Self-pay | Admitting: Family Medicine

## 2020-04-12 ENCOUNTER — Telehealth: Payer: Self-pay

## 2020-04-12 ENCOUNTER — Ambulatory Visit (INDEPENDENT_AMBULATORY_CARE_PROVIDER_SITE_OTHER): Payer: Medicare Other | Admitting: Family Medicine

## 2020-04-12 VITALS — BP 129/54 | HR 72 | Ht 62.0 in | Wt 213.0 lb

## 2020-04-12 DIAGNOSIS — E890 Postprocedural hypothyroidism: Secondary | ICD-10-CM | POA: Diagnosis not present

## 2020-04-12 DIAGNOSIS — M19011 Primary osteoarthritis, right shoulder: Secondary | ICD-10-CM

## 2020-04-12 DIAGNOSIS — M19012 Primary osteoarthritis, left shoulder: Secondary | ICD-10-CM | POA: Diagnosis not present

## 2020-04-12 DIAGNOSIS — M7989 Other specified soft tissue disorders: Secondary | ICD-10-CM

## 2020-04-12 DIAGNOSIS — R062 Wheezing: Secondary | ICD-10-CM

## 2020-04-12 DIAGNOSIS — J984 Other disorders of lung: Secondary | ICD-10-CM | POA: Diagnosis not present

## 2020-04-12 DIAGNOSIS — R21 Rash and other nonspecific skin eruption: Secondary | ICD-10-CM

## 2020-04-12 MED ORDER — CLOTRIMAZOLE-BETAMETHASONE 1-0.05 % EX CREA: 1.0000 "application " | TOPICAL_CREAM | Freq: Two times a day (BID) | CUTANEOUS | 2 refills | Status: DC

## 2020-04-12 MED ORDER — ASMANEX (120 METERED DOSES) 220 MCG/INH IN AEPB: 2.0000 | INHALATION_SPRAY | Freq: Every day | RESPIRATORY_TRACT | 3 refills | Status: DC

## 2020-04-12 MED ORDER — ARNUITY ELLIPTA 100 MCG/ACT IN AEPB: 1.0000 | INHALATION_SPRAY | Freq: Every day | RESPIRATORY_TRACT | 2 refills | Status: DC

## 2020-04-12 NOTE — Assessment & Plan Note (Signed)
We will bump up her Lasix to 60 mg daily for the next 2 days and then drop back down to 40 mg.  Limit fluid for more than 40 ounces a day avoid excess salt intake.  If not improving please let us know.  We did Ace wrap's on both legs today for compression she says she is not able to put compression stockings on by herself.  Follow-up if not improving no indication of DVT etc.

## 2020-04-12 NOTE — Telephone Encounter (Signed)
Asmanex required a PA. However they will cover Flovent or Arnuity. Please advise.

## 2020-04-12 NOTE — Assessment & Plan Note (Signed)
We will discontinue Flovent and try Asmanex instead just to see if it makes a difference.  I am also wondering if the increased fluid intake has affected her breathing at least in the last few days.  The lung exam is clear today though she did use her albuterol this morning.

## 2020-04-12 NOTE — Patient Instructions (Signed)
OK to increase you lasix to 60mg  for the day, today and tomorrow. Then drop back down to 40mg  through the weekend.   Keep feet elevated Drink no more than 40oz a day of fluids

## 2020-04-12 NOTE — Progress Notes (Signed)
Established Patient Office Visit  Subjective:  Patient ID: Martha Perkins, female    DOB: May 27, 1945  Age: 75 y.o. MRN: [id number]  CC:  Chief Complaint  Patient presents with  . Leg Swelling    BILATERAL    HPI Martha Perkins presents for bilt LE edema x 4 days.  She says she had actually missed taking her Lasix for several days and just did not realize that she was starting to accumulate fluid.  Then her cat jumped on her legs yesterday and when she realized how painful and tender they were.  She then noticed that she had some wetness on her left pant leg and saw that her legs were actually oozing and looked a little pink.  No fevers chills or sweats.  Would ilke cream for rash -she had previously been given Lotrisone cream by her podiatrist which she just uses as needed on a rash on her foot she would like to have a prescription I wanted to know if I would be willing to take over that prescription for her.  C/o fo wheezing -has been noticing it mostly at night and has been using her Flovent.  She is actually wondering if the Flovent is actually causing some of the wheezing she only uses her Flovent once a day and it usually in the evenings.  She said she had to wake up middle the night last night and use her albuterol because of the wheezing and feeling a little short of breath and tight in her chest.  Past Medical History:  Diagnosis Date  . Depression   . Hyperlipidemia   . Hypertension   . Hyperthyroidism 08/09/2015  . Kidney stone   . Paroxysmal atrial fibrillation ([facility]) 08/09/2015  . Thyroid disease     Past Surgical History:  Procedure Laterality Date  . CARDIOVERSION N/A 08/05/2016   Procedure: CARDIOVERSION;  Surgeon: Chilton Si, MD;  Location[facility];  Service: Cardiovascular;  Laterality: N/A;  . CATARACT EXTRACTION Left 09/2012   Dr. Darcus Pester  . LITHOTRIPSY    . REPLACEMENT TOTAL KNEE BILATERAL  2011  . THYROIDECTOMY  1971  . TUBAL LIGATION  1979     Family History  Problem Relation Age of Onset  . Coronary artery disease Mother 23       deceased  . Depression Mother   . Hyperlipidemia Mother   . Diabetes Paternal Grandmother   . Thyroid disease Sister   . COPD Father   . Breast cancer Neg Hx     Social History   Socioeconomic History  . Marital status: Married    Spouse name: Viviann Spare  . Number of children: 2  . Years of education: 16  . Highest education level: Bachelor's degree (e.g., BA, AB, BS)  Occupational History  . Occupation: Retired Charity fundraiser.   Tobacco Use  . Smoking status: Never Smoker  . Smokeless tobacco: Never Used  Vaping Use  . Vaping Use: Never used  Substance and Sexual Activity  . Alcohol use: No  . Drug use: No  . Sexual activity: Not Currently  Other Topics Concern  . Not on file  Social History Narrative   Stay around the house. Go to grocery store.    Social Determinants of Health   Financial Resource Strain: Not on file  Food Insecurity: Not on file  Transportation Needs: Not on file  Physical Activity: Not on file  Stress: Not on file  Social Connections: Not on file  Intimate Partner Violence:  Not on file    Outpatient Medications Prior to Visit  Medication Sig Dispense Refill  . acetaminophen (TYLENOL) 650 MG CR tablet Take 1 tablet (650 mg total) by mouth every 8 (eight) hours. 90 tablet 3  . albuterol (VENTOLIN HFA) 108 (90 Base) MCG/ACT inhaler INHALE TWO PUFFS INTO THE LUNGS EVERY 6 HOURS AS NEEDED FOR WHEEZING OR SHORTNESS OF BREATH 8.5 g 2  . atorvastatin (LIPITOR) 40 MG tablet TAKE 1 TABLET BY MOUTH  DAILY 90 tablet 3  . CARTIA XT 240 MG 24 hr capsule TAKE 1 CAPSULE BY MOUTH  DAILY 90 capsule 3  . fluticasone (FLONASE) 50 MCG/ACT nasal spray USE 1 SPRAY IN BOTH  NOSTRILS TWICE DAILY 48 g 3  . furosemide (LASIX) 20 MG tablet TAKE 1 TABLET BY MOUTH  DAILY 90 tablet 3  . ketoconazole (NIZORAL) 2 % cream Apply topically daily as needed for irritation. 60 g 3  . levothyroxine  (SYNTHROID) 150 MCG tablet Take 1 tablet (150 mcg total) by mouth daily before breakfast. Needs labs 30 tablet 0  . meloxicam (MOBIC) 15 MG tablet TAKE 1 TABLET BY MOUTH  DAILY 90 tablet 3  . metoprolol succinate (TOPROL-XL) 50 MG 24 hr tablet TAKE 1 TABLET BY MOUTH  DAILY WITH OR IMMEDIATELY  FOLLOWING A MEAL 90 tablet 3  . potassium chloride (K-DUR) 10 MEQ tablet Take 1 tablet (10 mEq total) by mouth daily as needed. 30 tablet 1  . traMADol (ULTRAM) 50 MG tablet Take 1 tablet (50 mg total) by mouth 3 (three) times daily as needed. 90 tablet 0  . triamcinolone cream (KENALOG) 0.1 % Apply 1 application topically 2 (two) times daily as needed. 45 g 0  . clotrimazole-betamethasone (LOTRISONE) cream Apply 1 application topically 2 (two) times daily. 45 g 2  . fluticasone (FLOVENT HFA) 110 MCG/ACT inhaler Inhale 2 puffs into the lungs 2 (two) times daily. 1 Inhaler 2   No facility-administered medications prior to visit.    Allergies  Allergen Reactions  . Demerol Nausea Only  . Meperidine Hcl Other (See Comments)    Feels loopy  . Oxycodone-Aspirin Other (See Comments)    Feels loopy  . Percocet [Oxycodone-Acetaminophen] Other (See Comments)    Feels loopy    ROS Review of Systems    Objective:    Physical Exam Constitutional:      Appearance: She is well-developed and well-nourished.  HENT:     Head: Normocephalic and atraumatic.  Cardiovascular:     Rate and Rhythm: Normal rate and regular rhythm.     Heart sounds: Normal heart sounds.  Pulmonary:     Effort: Pulmonary effort is normal.     Breath sounds: Normal breath sounds.  Musculoskeletal:     Comments: 1+ pitting edema all the way up to the knees bilaterally with some active weeping.  The shins have a little bit of erythema but is not well demarcated.  Skin:    General: Skin is warm and dry.  Neurological:     Mental Status: She is alert and oriented to person, place, and time.  Psychiatric:        Mood and Affect:  Mood and affect normal.        Behavior: Behavior normal.     BP (!) 129/54   Pulse 72   Ht 5\' 2"  (1.575 m)   Wt 213 lb (96.6 kg)   SpO2 99%   BMI 38.96 kg/m  Wt Readings from Last 3 Encounters:  04/12/20 213 lb (96.6 kg)  01/30/20 218 lb (98.9 kg)  12/26/19 212 lb (96.2 kg)     There are no preventive care reminders to display for this patient.  There are no preventive care reminders to display for this patient.  Lab Results  Component Value Date   TSH 5.95 (H) 12/26/2019   Lab Results  Component Value Date   WBC 6.2 06/30/2019   HGB 13.8 06/30/2019   HCT 41.0 06/30/2019   MCV 90.1 06/30/2019   PLT 187 06/30/2019   Lab Results  Component Value Date   NA 141 12/26/2019   K 4.2 12/26/2019   CO2 29 12/26/2019   GLUCOSE 89 12/26/2019   BUN 20 12/26/2019   CREATININE 0.75 12/26/2019   BILITOT 0.9 06/30/2019   ALKPHOS 119 08/25/2016   AST 24 06/30/2019   ALT 35 (H) 06/30/2019   PROT 7.1 06/30/2019   ALBUMIN 4.4 08/25/2016   CALCIUM 10.3 12/26/2019   Lab Results  Component Value Date   CHOL 180 06/30/2019   Lab Results  Component Value Date   HDL 51 06/30/2019   Lab Results  Component Value Date   LDLCALC 105 (H) 06/30/2019   Lab Results  Component Value Date   TRIG 147 06/30/2019   Lab Results  Component Value Date   CHOLHDL 3.5 06/30/2019   Lab Results  Component Value Date   HGBA1C 5.6 10/25/2019      Assessment & Plan:   Problem List Items Addressed This Visit      Respiratory   Restrictive lung disease    We will discontinue Flovent and try Asmanex instead just to see if it makes a difference.  I am also wondering if the increased fluid intake has affected her breathing at least in the last few days.  The lung exam is clear today though she did use her albuterol this morning.        Musculoskeletal and Integument   Primary osteoarthritis of both shoulders   Relevant Orders   Ambulatory referral to Orthopedic Surgery     Other    Localized swelling of both lower extremities    We will bump up her Lasix to 60 mg daily for the next 2 days and then drop back down to 40 mg.  Limit fluid for more than 40 ounces a day avoid excess salt intake.  If not improving please let us know.  We did Ace wrap's on both legs today for compression she says she is not able to put compression stockings on by herself.  Follow-up if not improving no indication of DVT etc.       Other Visit Diagnoses    Rash    -  Primary   Wheezing       Relevant Medications   mometasone (ASMANEX, 120 METERED DOSES,) 220 MCG/INH inhaler      Meds ordered this encounter  Medications  . clotrimazole-betamethasone (LOTRISONE) cream    Sig: Apply 1 application topically 2 (two) times daily.    Dispense:  45 g    Refill:  2  . mometasone (ASMANEX, 120 METERED DOSES,) 220 MCG/INH inhaler    Sig: Inhale 2 puffs into the lungs daily.    Dispense:  1 each    Refill:  3    Follow-up: No follow-ups on file.    Beatrice Lecher, MD

## 2020-04-12 NOTE — Telephone Encounter (Signed)
Prescription sent for Arnuity.

## 2020-04-12 NOTE — Progress Notes (Signed)
x3 DAYS  She reports that her L leg is worse. Pt stated that she hadn't taken her lasix for about 2 days. She restarted it yesterday but didn't take this morning due to the long drive here.   Pt mentioned prior to leaving that she wanted a referral for Ortho to Dr. Onnie Graham for shoulder pain.  Referral placed.

## 2020-04-13 ENCOUNTER — Other Ambulatory Visit: Payer: Self-pay | Admitting: Family Medicine

## 2020-04-13 DIAGNOSIS — G8929 Other chronic pain: Secondary | ICD-10-CM

## 2020-04-13 DIAGNOSIS — E890 Postprocedural hypothyroidism: Secondary | ICD-10-CM

## 2020-04-13 DIAGNOSIS — M5136 Other intervertebral disc degeneration, lumbar region: Secondary | ICD-10-CM

## 2020-04-13 LAB — TSH: TSH: 4.36 mIU/L (ref 0.40–4.50)

## 2020-04-13 MED ORDER — LEVOTHYROXINE SODIUM 150 MCG PO TABS: 150.0000 ug | ORAL_TABLET | Freq: Every day | ORAL | 0 refills | Status: DC

## 2020-04-13 NOTE — Telephone Encounter (Signed)
Patient advised.

## 2020-04-13 NOTE — Telephone Encounter (Signed)
Last written 03/13/2020 #90 with no refills Last appt yesterday

## 2020-04-23 ENCOUNTER — Encounter: Payer: Self-pay | Admitting: Family Medicine

## 2020-04-23 ENCOUNTER — Ambulatory Visit (INDEPENDENT_AMBULATORY_CARE_PROVIDER_SITE_OTHER): Payer: Medicare Other | Admitting: Family Medicine

## 2020-04-23 ENCOUNTER — Other Ambulatory Visit: Payer: Self-pay

## 2020-04-23 VITALS — BP 112/76 | HR 71 | Wt 217.4 lb

## 2020-04-23 DIAGNOSIS — M7989 Other specified soft tissue disorders: Secondary | ICD-10-CM

## 2020-04-23 DIAGNOSIS — R0602 Shortness of breath: Secondary | ICD-10-CM | POA: Diagnosis not present

## 2020-04-23 MED ORDER — FUROSEMIDE 20 MG PO TABS: 60.0000 mg | ORAL_TABLET | Freq: Every day | ORAL | 3 refills | Status: DC

## 2020-04-23 NOTE — Progress Notes (Signed)
Established Patient Office Visit  Subjective:  Patient ID: Martha Perkins, female    DOB: Dec 05, 1945  Age: 75 y.o. MRN: [id number]  CC:  Chief Complaint  Patient presents with  . Leg Swelling    HPI Britten Parady presents for lower extremity edema and oozing.  Patient was seen on 04/12/20 by Dr. Madilyn Fireman for bilateral lower extremity edema x4 days after missing several days of her lasix. At that visit, her legs were wrapped in ACE compression and lasix was increased to 60 mg daily x2 days then back down to 40 mg daily. At that appointment her TSH was also check and synthroid increased with plans to recheck in 2 months. Flovent was also changed to Arnuity for recent wheezing and increased need for Albuterol inhaler.  After adjustments last visit, she noticed a few days of improvement, but symptoms returned last week. Reports she may actually be a little worse off (especially weeping) than when she saw Dr. Madilyn Fireman.  Today the patient reports lower extremity edema and oozing that is possibly a little bit worse from when she first saw Dr. Madilyn Fireman about 10 days ago.  She states she saw a few small papules potentially on her bilateral lower legs and maybe a few open areas that she was concerned about.  The oozing has been clear.  She states she is not in any pain but her legs do burn or sting occasionally throughout the day.  She reports currently taking 40 mg of Lasix every morning however she does occasionally miss doses.  When she last saw Dr. Madilyn Fireman she did take 3 tablets a day for 2 days, but reports she noticed a few days later the weeping increased and so she decided to take an extra tablet a day for another day or 2.  Patient states that when she left the office with the compression wrap on she only left it on for that day and took it off at bedtime.  She stated her husband tried to reapply it the next morning but she felt like it was not a very good job.  She has not been wearing compression  hose regularly because her arthritis prevents her from applying them to herself.  Her husband will help occasionally but has been very busy lately.  She recently ordered some with the zipper on the side but they have not come in yet.  Hopefully she will be able to use these.  She states she has been trying to keep an eye on her sodium and fluid intake.  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  She reports that she is trying to keep her feet propped up frequently throughout the day.  At this time she denies any increased shortness of breath, chest pain, or wheezing.  She has not yet started the new inhaler from Dr. Madilyn Fireman.  She wanted to finish her current Flovent since these are expensive.  She has only had to use her albuterol inhaler about twice in the past week.  She does report needing to sleep with 2 pillows however this has been her baseline for many years.     Past Medical History:  Diagnosis Date  . Depression   . Hyperlipidemia   . Hypertension   . Hyperthyroidism 08/09/2015  . Kidney stone   . Paroxysmal atrial fibrillation ([place]) 08/09/2015  . Thyroid disease     Past Surgical History:  Procedure Laterality Date  . CARDIOVERSION N/A 08/05/2016   Procedure: CARDIOVERSION;  Surgeon: Skeet Latch, MD;  Location: [facility];  Service: Cardiovascular;  Laterality: N/A;  . CATARACT EXTRACTION Left 09/2012   Dr. Bunnie Philips  . LITHOTRIPSY    . REPLACEMENT TOTAL KNEE BILATERAL  2011  . THYROIDECTOMY  1971  . TUBAL LIGATION  1979    Family History  Problem Relation Age of Onset  . Coronary artery disease Mother 51       deceased  . Depression Mother   . Hyperlipidemia Mother   . Diabetes Paternal Grandmother   . Thyroid disease Sister   . COPD Father   . Breast cancer Neg Hx     Social History   Socioeconomic History  . Marital status: Married    Spouse name: Remo Lipps  . Number of children: 2  . Years of education: 16  . Highest education level:  Bachelor's degree (e.g., BA, AB, BS)  Occupational History  . Occupation: Retired Therapist, sports.   Tobacco Use  . Smoking status: Never Smoker  . Smokeless tobacco: Never Used  Vaping Use  . Vaping Use: Never used  Substance and Sexual Activity  . Alcohol use: No  . Drug use: No  . Sexual activity: Not Currently  Other Topics Concern  . Not on file  Social History Narrative   Stay around the house. Go to grocery store.    Social Determinants of Health   Financial Resource Strain: Not on file  Food Insecurity: Not on file  Transportation Needs: Not on file  Physical Activity: Not on file  Stress: Not on file  Social Connections: Not on file  Intimate Partner Violence: Not on file    Outpatient Medications Prior to Visit  Medication Sig Dispense Refill  . acetaminophen (TYLENOL) 650 MG CR tablet Take 1 tablet (650 mg total) by mouth every 8 (eight) hours. 90 tablet 3  . albuterol (VENTOLIN HFA) 108 (90 Base) MCG/ACT inhaler INHALE TWO PUFFS INTO THE LUNGS EVERY 6 HOURS AS NEEDED FOR WHEEZING OR SHORTNESS OF BREATH 8.5 g 2  . atorvastatin (LIPITOR) 40 MG tablet TAKE 1 TABLET BY MOUTH  DAILY 90 tablet 3  . CARTIA XT 240 MG 24 hr capsule TAKE 1 CAPSULE BY MOUTH  DAILY 90 capsule 3  . clotrimazole-betamethasone (LOTRISONE) cream Apply 1 application topically 2 (two) times daily. 45 g 2  . fluticasone (FLONASE) 50 MCG/ACT nasal spray USE 1 SPRAY IN BOTH  NOSTRILS TWICE DAILY 48 g 3  . Fluticasone Furoate (ARNUITY ELLIPTA) 100 MCG/ACT AEPB Inhale 1 Inhaler into the lungs daily. 30 each 2  . ketoconazole (NIZORAL) 2 % cream Apply topically daily as needed for irritation. 60 g 3  . levothyroxine (SYNTHROID) 150 MCG tablet Take 1 tablet (150 mcg total) by mouth daily before breakfast. Take extra half a tab one day a week 102 tablet 0  . meloxicam (MOBIC) 15 MG tablet TAKE 1 TABLET BY MOUTH  DAILY 90 tablet 3  . metoprolol succinate (TOPROL-XL) 50 MG 24 hr tablet TAKE 1 TABLET BY MOUTH  DAILY WITH  OR IMMEDIATELY  FOLLOWING A MEAL 90 tablet 3  . potassium chloride (K-DUR) 10 MEQ tablet Take 1 tablet (10 mEq total) by mouth daily as needed. 30 tablet 1  . traMADol (ULTRAM) 50 MG tablet Take 1 tablet (50 mg total) by mouth 3 (three) times daily as needed. 90 tablet 0  . triamcinolone cream (KENALOG) 0.1 % Apply 1 application topically 2 (two) times daily as needed. 45 g 0  . furosemide (LASIX) 20  MG tablet TAKE 1 TABLET BY MOUTH  DAILY 90 tablet 3   No facility-administered medications prior to visit.    Allergies  Allergen Reactions  . Demerol Nausea Only  . Meperidine Hcl Other (See Comments)    Feels loopy  . Oxycodone-Aspirin Other (See Comments)    Feels loopy  . Percocet [Oxycodone-Acetaminophen] Other (See Comments)    Feels loopy    ROS Review of Systems All review of systems negative except what is listed in the HPI    Objective:    Physical Exam Constitutional:      Appearance: Normal appearance.  HENT:     Head: Normocephalic and atraumatic.  Cardiovascular:     Rate and Rhythm: Normal rate and regular rhythm.     Heart sounds: Murmur heard.    Pulmonary:     Effort: Pulmonary effort is normal. No respiratory distress.     Breath sounds: Normal breath sounds. No wheezing, rhonchi or rales.  Skin:    General: Skin is warm.     Capillary Refill: Capillary refill takes less than 2 seconds.     Comments: Bilateral lower legs with 2-3+ pitting edema, slight erythema at baseline per patient, minimal cracking/excoriation bilaterally and risk for ulcer formation due to increased weeping, unna boots applied  Neurological:     General: No focal deficit present.     Mental Status: She is alert and oriented to person, place, and time.  Psychiatric:        Mood and Affect: Mood normal.        Behavior: Behavior normal.        Thought Content: Thought content normal.        Judgment: Judgment normal.     BP 112/76   Pulse 71   Wt 217 lb 6.4 oz (98.6 kg)    SpO2 97%   BMI 39.76 kg/m  Wt Readings from Last 3 Encounters:  04/23/20 217 lb 6.4 oz (98.6 kg)  04/12/20 213 lb (96.6 kg)  01/30/20 218 lb (98.9 kg)     There are no preventive care reminders to display for this patient.  There are no preventive care reminders to display for this patient.  Lab Results  Component Value Date   TSH 4.36 04/12/2020   Lab Results  Component Value Date   WBC 6.2 06/30/2019   HGB 13.8 06/30/2019   HCT 41.0 06/30/2019   MCV 90.1 06/30/2019   PLT 187 06/30/2019   Lab Results  Component Value Date   NA 141 12/26/2019   K 4.2 12/26/2019   CO2 29 12/26/2019   GLUCOSE 89 12/26/2019   BUN 20 12/26/2019   CREATININE 0.75 12/26/2019   BILITOT 0.9 06/30/2019   ALKPHOS 119 08/25/2016   AST 24 06/30/2019   ALT 35 (H) 06/30/2019   PROT 7.1 06/30/2019   ALBUMIN 4.4 08/25/2016   CALCIUM 10.3 12/26/2019   Lab Results  Component Value Date   CHOL 180 06/30/2019   Lab Results  Component Value Date   HDL 51 06/30/2019   Lab Results  Component Value Date   LDLCALC 105 (H) 06/30/2019   Lab Results  Component Value Date   TRIG 147 06/30/2019   Lab Results  Component Value Date   CHOLHDL 3.5 06/30/2019   Lab Results  Component Value Date   HGBA1C 5.6 10/25/2019      Assessment & Plan:   Problem List Items Addressed This Visit      Other   Localized  swelling of both lower extremities - Primary    Will increase patient to lasix 60 mg daily and check BMP and BNP today; order for repeat BMP in 1 week. Unna boots applied today by CMA and checked by this NP prior to leaving office- follow-up in 1 week to check progress. Reinforced sodium and fluid restriction education and significance of keeping legs elevated.       Relevant Medications   furosemide (LASIX) 20 MG tablet   Other Relevant Orders   BASIC METABOLIC PANEL WITH GFR   Brain natriuretic peptide   BASIC METABOLIC PANEL WITH GFR      Meds ordered this encounter   Medications  . furosemide (LASIX) 20 MG tablet    Sig: Take 3 tablets (60 mg total) by mouth daily.    Dispense:  90 tablet    Refill:  3    Requesting 1 year supply      Follow-up: Return in about 1 week (around 04/30/2020) for BLE edema.    Terrilyn Saver, NP

## 2020-04-23 NOTE — Patient Instructions (Signed)
Go ahead and take 60 mg of lasix daily. We will check labs today and again in about a week to make sure everything is still looking fine. With this increase. Unna boots applied today. Follow-up in 1 week so we can check your legs again. Take them off and wash your legs prior to coming in to your appointment so we can visualize your legs better. Keep using your inhalers as ordered since this has been helping. Your lungs sound good today. Limit your sodium and fluid as previously instructed.  Keep your legs elevated as much as you can.

## 2020-04-23 NOTE — Assessment & Plan Note (Addendum)
Will increase patient to lasix 60 mg daily and check BMP and BNP today; order for repeat BMP in 1 week. Unna boots applied today by CMA and checked by this NP prior to leaving office- follow-up in 1 week to check progress. Reinforced sodium and fluid restriction education and significance of keeping legs elevated.

## 2020-04-24 LAB — BASIC METABOLIC PANEL WITH GFR
BUN: 16 mg/dL (ref 7–25)
CO2: 31 mmol/L (ref 20–32)
Calcium: 9.9 mg/dL (ref 8.6–10.4)
Chloride: 102 mmol/L (ref 98–110)
Creat: 0.76 mg/dL (ref 0.60–0.93)
GFR, Est African American: 91 mL/min/{1.73_m2} (ref >=60)
GFR, Est Non African American: 78 mL/min/{1.73_m2} (ref >=60)
Glucose, Bld: 85 mg/dL (ref 65–99)
Potassium: 3.8 mmol/L (ref 3.5–5.3)
Sodium: 141 mmol/L (ref 135–146)

## 2020-04-24 LAB — BRAIN NATRIURETIC PEPTIDE: Brain Natriuretic Peptide: 25 pg/mL (ref <100)

## 2020-04-24 NOTE — Progress Notes (Signed)
Labs look great. We will recheck edema and another BMP next week since lasix was increased.

## 2020-04-30 ENCOUNTER — Other Ambulatory Visit: Payer: Self-pay

## 2020-04-30 ENCOUNTER — Encounter: Payer: Self-pay | Admitting: Family Medicine

## 2020-04-30 ENCOUNTER — Ambulatory Visit (INDEPENDENT_AMBULATORY_CARE_PROVIDER_SITE_OTHER): Payer: Medicare Other | Admitting: Family Medicine

## 2020-04-30 VITALS — BP 132/76 | HR 72 | Wt 212.2 lb

## 2020-04-30 DIAGNOSIS — M7989 Other specified soft tissue disorders: Secondary | ICD-10-CM

## 2020-04-30 LAB — BASIC METABOLIC PANEL WITH GFR
BUN: 17 mg/dL (ref 7–25)
CO2: 32 mmol/L (ref 20–32)
Calcium: 10.1 mg/dL (ref 8.6–10.4)
Chloride: 102 mmol/L (ref 98–110)
Creat: 0.76 mg/dL (ref 0.60–0.93)
GFR, Est African American: 91 mL/min/{1.73_m2} (ref >=60)
GFR, Est Non African American: 78 mL/min/{1.73_m2} (ref >=60)
Glucose, Bld: 112 mg/dL — ABNORMAL HIGH (ref 65–99)
Potassium: 3.6 mmol/L (ref 3.5–5.3)
Sodium: 142 mmol/L (ref 135–146)

## 2020-04-30 NOTE — Progress Notes (Addendum)
Acute Office Visit  Subjective:    Patient ID: Martha Perkins, female    DOB: October 03, 1945, 75 y.o.   MRN: [id number]  Chief Complaint  Patient presents with  . Leg Swelling    HPI Patient is in today for lower extremity edema follow-up. At last visit on 04/23/20, her lasix was increased to 60 mg daily and unna boots were applied. BMP and BNP taken that day were normal. Following up today to recheck swelling and BMP following increased lasix.   Weight: 212.2 lb today (down over 5 pounds from last visit!) Edema/weeping: much improved compared to last time, but still feels like there was a little weeping last night - first night since taking off unna boots Dyspnea: none Voiding: increased voiding with increased lasix, clear, no concerns Na/Fluid: compliant at home, well educated Elevation: compliant, trying to keep them propped up most of the day Unna boots: stayed on until Sunday; Reports some mild discomfort where the boots may have rubbed a little too tightly over left upper foot region and right leg slightly below knee - denies any open sores, oozing, erythema, pain Compression socks (zippered): delivered this week, planning to try today  Overall patient reports she is pleased with the progress and is feeling much better. She states her legs feel less swollen and are not weeping nearly as much; noticed maybe a little bit last night. She denies chest pain, shortness of breath, open cuts/sores, fatigue, leg pain, color changes to lower extremities. Denies any adverse effects with increased lasix does.      Past Medical History:  Diagnosis Date  . Depression   . Hyperlipidemia   . Hypertension   . Hyperthyroidism 08/09/2015  . Kidney stone   . Paroxysmal atrial fibrillation ([place]) 08/09/2015  . Thyroid disease     Past Surgical History:  Procedure Laterality Date  . CARDIOVERSION N/A 08/05/2016   Procedure: CARDIOVERSION;  Surgeon: Skeet Latch, MD;  Location: [facility];   Service: Cardiovascular;  Laterality: N/A;  . CATARACT EXTRACTION Left 09/2012   Dr. Bunnie Philips  . LITHOTRIPSY    . REPLACEMENT TOTAL KNEE BILATERAL  2011  . THYROIDECTOMY  1971  . TUBAL LIGATION  1979    Family History  Problem Relation Age of Onset  . Coronary artery disease Mother 18       deceased  . Depression Mother   . Hyperlipidemia Mother   . Diabetes Paternal Grandmother   . Thyroid disease Sister   . COPD Father   . Breast cancer Neg Hx     Social History   Socioeconomic History  . Marital status: Married    Spouse name: Remo Lipps  . Number of children: 2  . Years of education: 16  . Highest education level: Bachelor's degree (e.g., BA, AB, BS)  Occupational History  . Occupation: Retired Therapist, sports.   Tobacco Use  . Smoking status: Never Smoker  . Smokeless tobacco: Never Used  Vaping Use  . Vaping Use: Never used  Substance and Sexual Activity  . Alcohol use: No  . Drug use: No  . Sexual activity: Not Currently  Other Topics Concern  . Not on file  Social History Narrative   Stay around the house. Go to grocery store.    Social Determinants of Health   Financial Resource Strain: Not on file  Food Insecurity: Not on file  Transportation Needs: Not on file  Physical Activity: Not on file  Stress: Not on file  Social Connections: Not on  file  Intimate Partner Violence: Not on file    Outpatient Medications Prior to Visit  Medication Sig Dispense Refill  . acetaminophen (TYLENOL) 650 MG CR tablet Take 1 tablet (650 mg total) by mouth every 8 (eight) hours. 90 tablet 3  . albuterol (VENTOLIN HFA) 108 (90 Base) MCG/ACT inhaler INHALE TWO PUFFS INTO THE LUNGS EVERY 6 HOURS AS NEEDED FOR WHEEZING OR SHORTNESS OF BREATH 8.5 g 2  . atorvastatin (LIPITOR) 40 MG tablet TAKE 1 TABLET BY MOUTH  DAILY 90 tablet 3  . CARTIA XT 240 MG 24 hr capsule TAKE 1 CAPSULE BY MOUTH  DAILY 90 capsule 3  . clotrimazole-betamethasone (LOTRISONE) cream Apply 1 application topically  2 (two) times daily. 45 g 2  . fluticasone (FLONASE) 50 MCG/ACT nasal spray USE 1 SPRAY IN BOTH  NOSTRILS TWICE DAILY 48 g 3  . Fluticasone Furoate (ARNUITY ELLIPTA) 100 MCG/ACT AEPB Inhale 1 Inhaler into the lungs daily. 30 each 2  . furosemide (LASIX) 20 MG tablet Take 3 tablets (60 mg total) by mouth daily. 90 tablet 3  . ketoconazole (NIZORAL) 2 % cream Apply topically daily as needed for irritation. 60 g 3  . levothyroxine (SYNTHROID) 150 MCG tablet Take 1 tablet (150 mcg total) by mouth daily before breakfast. Take extra half a tab one day a week 102 tablet 0  . meloxicam (MOBIC) 15 MG tablet TAKE 1 TABLET BY MOUTH  DAILY 90 tablet 3  . metoprolol succinate (TOPROL-XL) 50 MG 24 hr tablet TAKE 1 TABLET BY MOUTH  DAILY WITH OR IMMEDIATELY  FOLLOWING A MEAL 90 tablet 3  . potassium chloride (K-DUR) 10 MEQ tablet Take 1 tablet (10 mEq total) by mouth daily as needed. 30 tablet 1  . traMADol (ULTRAM) 50 MG tablet Take 1 tablet (50 mg total) by mouth 3 (three) times daily as needed. 90 tablet 0  . triamcinolone cream (KENALOG) 0.1 % Apply 1 application topically 2 (two) times daily as needed. 45 g 0   No facility-administered medications prior to visit.    Allergies  Allergen Reactions  . Demerol Nausea Only  . Meperidine Hcl Other (See Comments)    Feels loopy  . Oxycodone-Aspirin Other (See Comments)    Feels loopy  . Percocet [Oxycodone-Acetaminophen] Other (See Comments)    Feels loopy    Review of Systems All review of systems negative except what is listed in the HPI     Objective:    Physical Exam Constitutional:      Appearance: Normal appearance. She is obese.  HENT:     Head: Normocephalic and atraumatic.  Cardiovascular:     Rate and Rhythm: Normal rate and regular rhythm.     Pulses: Normal pulses.     Heart sounds: Normal heart sounds.  Pulmonary:     Effort: Pulmonary effort is normal. No respiratory distress.     Breath sounds: Normal breath sounds. No  wheezing or rales.  Skin:    General: Skin is warm and dry.     Capillary Refill: Capillary refill takes less than 2 seconds.     Comments: BLE with 1+ pitting edema (much improved from last week's assessment), mild blanchable erythema bilaterally at baseline per patient; L upper foot and R upper leg below knee with small area of increased erythema, remains blanchable, no open areas noted - caused by unna boot/shoe friction per patient; no weeping or open wound/ulcerations noted today  Neurological:     General: No focal deficit present.  Mental Status: She is alert and oriented to person, place, and time. Mental status is at baseline.  Psychiatric:        Mood and Affect: Mood normal.        Behavior: Behavior normal.        Thought Content: Thought content normal.        Judgment: Judgment normal.     BP 132/76   Pulse 72   Wt 212 lb 3.2 oz (96.3 kg)   SpO2 94%   BMI 38.81 kg/m  Wt Readings from Last 3 Encounters:  04/30/20 212 lb 3.2 oz (96.3 kg)  04/23/20 217 lb 6.4 oz (98.6 kg)  04/12/20 213 lb (96.6 kg)    There are no preventive care reminders to display for this patient.  There are no preventive care reminders to display for this patient.   Lab Results  Component Value Date   TSH 4.36 04/12/2020   Lab Results  Component Value Date   WBC 6.2 06/30/2019   HGB 13.8 06/30/2019   HCT 41.0 06/30/2019   MCV 90.1 06/30/2019   PLT 187 06/30/2019   Lab Results  Component Value Date   NA 141 04/23/2020   K 3.8 04/23/2020   CO2 31 04/23/2020   GLUCOSE 85 04/23/2020   BUN 16 04/23/2020   CREATININE 0.76 04/23/2020   BILITOT 0.9 06/30/2019   ALKPHOS 119 08/25/2016   AST 24 06/30/2019   ALT 35 (H) 06/30/2019   PROT 7.1 06/30/2019   ALBUMIN 4.4 08/25/2016   CALCIUM 9.9 04/23/2020   Lab Results  Component Value Date   CHOL 180 06/30/2019   Lab Results  Component Value Date   HDL 51 06/30/2019   Lab Results  Component Value Date   LDLCALC 105 (H)  06/30/2019   Lab Results  Component Value Date   TRIG 147 06/30/2019   Lab Results  Component Value Date   CHOLHDL 3.5 06/30/2019   Lab Results  Component Value Date   HGBA1C 5.6 10/25/2019       Assessment & Plan:   Problem List Items Addressed This Visit      Other   Localized swelling of both lower extremities - Primary    Very pleased with progress since last week's visit. We will recheck kidney function today and plan to continue on 60 mg of lasix daily if BMP remains normal. Recommend she continue to monitor and limit sodium and fluid intake. Reinforced significance of keeping her legs elevated when sitting and maintaining some physical activity throughout the day. Encouraged calf pumping while sitting to promote circulation. Patient's new zipper compression socks were delivered this week and she would prefer to try these instead of doing unna boots again right away. Still some 1+ swelling, but much improved from last visit, so we can try her home stockings for now. Patient instructed to weigh herself daily and notify us of any signs of fluid retention - education provided. No open wounds noted today and coloration is at baseline per patient - encouraged her to inspect her feet and legs daily and watch for signs of fluid retention or infection.  Plan for routine follow-up for chronic disease management with PCP in the next month or so, or sooner if symptoms worsen or fail to improve.       Relevant Orders   BASIC METABOLIC PANEL WITH GFR       Follow-up with PCP in about a month for chronic disease management. Follow-up sooner if symptoms  worsen or fail to improve.   Terrilyn Saver, NP

## 2020-04-30 NOTE — Assessment & Plan Note (Signed)
Very pleased with progress since last week's visit. We will recheck kidney function today and plan to continue on 60 mg of lasix daily if BMP remains normal. Recommend she continue to monitor and limit sodium and fluid intake. Reinforced significance of keeping her legs elevated when sitting and maintaining some physical activity throughout the day. Encouraged calf pumping while sitting to promote circulation. Patient's new zipper compression socks were delivered this week and she would prefer to try these instead of doing unna boots again right away. Still some 1+ swelling, but much improved from last visit, so we can try her home stockings for now. Patient instructed to weigh herself daily and notify us of any signs of fluid retention - education provided. No open wounds noted today and coloration is at baseline per patient - encouraged her to inspect her feet and legs daily and watch for signs of fluid retention or infection.  Plan for routine follow-up for chronic disease management with PCP in the next month or so, or sooner if symptoms worsen or fail to improve.

## 2020-04-30 NOTE — Patient Instructions (Addendum)
I'm glad to see you doing better! You lost 5 pounds in fluid since last week - great job! I want you to continue to 60 mg of lasix daily and we will recheck your kidney function today to make sure it is still looking normal with the higher dose.  Continue limiting your sodium and fluid intake. Elevate your legs if you are sitting. Remember to get some physical activity each day, walking, calf pumps while sitting down. Try your new compression socks during the day as much as possible.  If you start gaining weight (>2 pounds overnight or 5 pounds in a week), start to notice increased swelling or weeping then please let us know! The skin on your legs appears intact today, but if you notice some raw spots from the compression socks you can use some neosporin and watch for signs of infection. Be sure your legs are completely dry before applying compression socks.   Edema  Edema is when you have too much fluid in your body or under your skin. Edema may make your legs, feet, and ankles swell up. Swelling is also common in looser tissues, like around your eyes. This is a common condition. It gets more common as you get older. There are many possible causes of edema. Eating too much salt (sodium) and being on your feet or sitting for a long time can cause edema in your legs, feet, and ankles. Hot weather may make edema worse. Edema is usually painless. Your skin may look swollen or shiny. Follow these instructions at home:  Keep the swollen body part raised (elevated) above the level of your heart when you are sitting or lying down.  Do not sit still or stand for a long time.  Do not wear tight clothes. Do not wear garters on your upper legs.  Exercise your legs. This can help the swelling go down.  Wear elastic bandages or support stockings as told by your doctor.  Eat a low-salt (low-sodium) diet to reduce fluid as told by your doctor.  Depending on the cause of your swelling, you may need to  limit how much fluid you drink (fluid restriction).  Take over-the-counter and prescription medicines only as told by your doctor. Contact a doctor if:  Treatment is not working.  You have heart, liver, or kidney disease and have symptoms of edema.  You have sudden and unexplained weight gain. Get help right away if:  You have shortness of breath or chest pain.  You cannot breathe when you lie down.  You have pain, redness, or warmth in the swollen areas.  You have heart, liver, or kidney disease and get edema all of a sudden.  You have a fever and your symptoms get worse all of a sudden. Summary  Edema is when you have too much fluid in your body or under your skin.  Edema may make your legs, feet, and ankles swell up. Swelling is also common in looser tissues, like around your eyes.  Raise (elevate) the swollen body part above the level of your heart when you are sitting or lying down.  Follow your doctor's instructions about diet and how much fluid you can drink (fluid restriction). This information is not intended to replace advice given to you by your health care provider. Make sure you discuss any questions you have with your health care provider. Document Revised: 12/21/2019 Document Reviewed: 12/21/2019 Elsevier Patient Education  2021 [employer].

## 2020-05-01 NOTE — Progress Notes (Signed)
Your labs are still looking good. Let's continue the lasix at 60 mg a day like we did all last week. Keep up the good work of trying to get that fluid off!

## 2020-05-09 DIAGNOSIS — M25511 Pain in right shoulder: Secondary | ICD-10-CM | POA: Diagnosis not present

## 2020-05-09 DIAGNOSIS — M19012 Primary osteoarthritis, left shoulder: Secondary | ICD-10-CM | POA: Diagnosis not present

## 2020-05-09 DIAGNOSIS — M25512 Pain in left shoulder: Secondary | ICD-10-CM | POA: Diagnosis not present

## 2020-05-14 ENCOUNTER — Other Ambulatory Visit: Payer: Self-pay | Admitting: Family Medicine

## 2020-05-14 DIAGNOSIS — M5136 Other intervertebral disc degeneration, lumbar region: Secondary | ICD-10-CM

## 2020-05-14 DIAGNOSIS — G8929 Other chronic pain: Secondary | ICD-10-CM

## 2020-05-28 ENCOUNTER — Ambulatory Visit (INDEPENDENT_AMBULATORY_CARE_PROVIDER_SITE_OTHER): Payer: Medicare Other | Admitting: Family Medicine

## 2020-05-28 ENCOUNTER — Encounter: Payer: Self-pay | Admitting: Family Medicine

## 2020-05-28 ENCOUNTER — Other Ambulatory Visit: Payer: Self-pay

## 2020-05-28 VITALS — BP 132/80 | HR 65 | Ht 62.0 in | Wt 211.0 lb

## 2020-05-28 DIAGNOSIS — I10 Essential (primary) hypertension: Secondary | ICD-10-CM

## 2020-05-28 DIAGNOSIS — K760 Fatty (change of) liver, not elsewhere classified: Secondary | ICD-10-CM | POA: Diagnosis not present

## 2020-05-28 DIAGNOSIS — R42 Dizziness and giddiness: Secondary | ICD-10-CM | POA: Diagnosis not present

## 2020-05-28 DIAGNOSIS — I878 Other specified disorders of veins: Secondary | ICD-10-CM

## 2020-05-28 DIAGNOSIS — M7989 Other specified soft tissue disorders: Secondary | ICD-10-CM

## 2020-05-28 DIAGNOSIS — E890 Postprocedural hypothyroidism: Secondary | ICD-10-CM | POA: Diagnosis not present

## 2020-05-28 MED ORDER — POTASSIUM CHLORIDE CRYS ER 10 MEQ PO TBCR: 10.0000 meq | EXTENDED_RELEASE_TABLET | Freq: Every day | ORAL | 1 refills | Status: DC | PRN

## 2020-05-28 NOTE — Assessment & Plan Note (Signed)
Plan to recheck liver enzymes. 

## 2020-05-28 NOTE — Assessment & Plan Note (Signed)
Well controlled. Continue current regimen. Follow up in  6 mo  

## 2020-05-28 NOTE — Assessment & Plan Note (Signed)
Continue with 3 tabs of furosemide and tke the potassium with it. Will renew the rx for potassium. Make sure to wear the stocking daily.

## 2020-05-28 NOTE — Assessment & Plan Note (Signed)
We increased her dose by extra half a tab one day a week about 8 weeks ago. Due to recheck TSH.  Will order labs and she will go in the next couple of week.

## 2020-05-28 NOTE — Progress Notes (Signed)
Established Patient Office Visit  Subjective:  Patient ID: Martha Perkins, female    DOB: [birth year]  Age: 75 y.o. MRN: [id number]  CC:  Chief Complaint  Patient presents with  . Edema    HPI Martha Perkins presents for LE swelling.   She admits she has not been as consistent with the compression stockings as she should be she has been try to get her feet up in the recliner she says they were doing pretty good until yesterday/today where they were little bit more swollen and starting to turn a little pink and red again.  She has not taken her diuretic yet today but did go back up to 3 tabs yesterday.  Hypertension- Pt denies chest pain, SOB, dizziness, or heart palpitations.  Taking meds as directed w/o problems.  Denies medication side effects.  Does report noticing feeling lightheaded more often recently.      Past Medical History:  Diagnosis Date  . Depression   . Hyperlipidemia   . Hypertension   . Hyperthyroidism 08/09/2015  . Kidney stone   . Paroxysmal atrial fibrillation ([place]) 08/09/2015  . Thyroid disease     Past Surgical History:  Procedure Laterality Date  . CARDIOVERSION N/A 08/05/2016   Procedure: CARDIOVERSION;  Surgeon: Skeet Latch, MD;  Location: [place];  Service: Cardiovascular;  Laterality: N/A;  . CATARACT EXTRACTION Left 09/2012   Dr. Bunnie Philips  . LITHOTRIPSY    . REPLACEMENT TOTAL KNEE BILATERAL  2011  . THYROIDECTOMY  1971  . TUBAL LIGATION  1979    Family History  Problem Relation Age of Onset  . Coronary artery disease Mother 55       deceased  . Depression Mother   . Hyperlipidemia Mother   . Diabetes Paternal Grandmother   . Thyroid disease Sister   . COPD Father   . Breast cancer Neg Hx     Social History   Socioeconomic History  . Marital status: Married    Spouse name: Remo Lipps  . Number of children: 2  . Years of education: 16  . Highest education level: Bachelor's degree (e.g., BA, AB, BS)  Occupational History  .  Occupation: Retired Therapist, sports.   Tobacco Use  . Smoking status: Never Smoker  . Smokeless tobacco: Never Used  Vaping Use  . Vaping Use: Never used  Substance and Sexual Activity  . Alcohol use: No  . Drug use: No  . Sexual activity: Not Currently  Other Topics Concern  . Not on file  Social History Narrative   Stay around the house. Go to grocery store.    Social Determinants of Health   Financial Resource Strain: Not on file  Food Insecurity: Not on file  Transportation Needs: Not on file  Physical Activity: Not on file  Stress: Not on file  Social Connections: Not on file  Intimate Partner Violence: Not on file    Outpatient Medications Prior to Visit  Medication Sig Dispense Refill  . acetaminophen (TYLENOL) 650 MG CR tablet Take 1 tablet (650 mg total) by mouth every 8 (eight) hours. 90 tablet 3  . albuterol (VENTOLIN HFA) 108 (90 Base) MCG/ACT inhaler INHALE TWO PUFFS INTO THE LUNGS EVERY 6 HOURS AS NEEDED FOR WHEEZING OR SHORTNESS OF BREATH 8.5 g 2  . atorvastatin (LIPITOR) 40 MG tablet TAKE 1 TABLET BY MOUTH  DAILY 90 tablet 3  . CARTIA XT 240 MG 24 hr capsule TAKE 1 CAPSULE BY MOUTH  DAILY 90 capsule 3  .  clotrimazole-betamethasone (LOTRISONE) cream Apply 1 application topically 2 (two) times daily. 45 g 2  . fluticasone (FLONASE) 50 MCG/ACT nasal spray USE 1 SPRAY IN BOTH  NOSTRILS TWICE DAILY 48 g 3  . Fluticasone Furoate (ARNUITY ELLIPTA) 100 MCG/ACT AEPB Inhale 1 Inhaler into the lungs daily. 30 each 2  . furosemide (LASIX) 20 MG tablet Take 3 tablets (60 mg total) by mouth daily. 90 tablet 3  . ketoconazole (NIZORAL) 2 % cream Apply topically daily as needed for irritation. 60 g 3  . levothyroxine (SYNTHROID) 150 MCG tablet Take 1 tablet (150 mcg total) by mouth daily before breakfast. Take extra half a tab one day a week 102 tablet 0  . meloxicam (MOBIC) 15 MG tablet TAKE 1 TABLET BY MOUTH  DAILY 90 tablet 3  . metoprolol succinate (TOPROL-XL) 50 MG 24 hr tablet TAKE 1  TABLET BY MOUTH  DAILY WITH OR IMMEDIATELY  FOLLOWING A MEAL 90 tablet 3  . traMADol (ULTRAM) 50 MG tablet Take 1 tablet (50 mg total) by mouth 3 (three) times daily as needed. 90 tablet 0  . triamcinolone cream (KENALOG) 0.1 % Apply 1 application topically 2 (two) times daily as needed. 45 g 0  . potassium chloride (K-DUR) 10 MEQ tablet Take 1 tablet (10 mEq total) by mouth daily as needed. 30 tablet 1   No facility-administered medications prior to visit.    Allergies  Allergen Reactions  . Demerol Nausea Only  . Meperidine Hcl Other (See Comments)    Feels loopy  . Oxycodone-Aspirin Other (See Comments)    Feels loopy  . Percocet [Oxycodone-Acetaminophen] Other (See Comments)    Feels loopy    ROS Review of Systems    Objective:    Physical Exam Constitutional:      Appearance: She is well-developed.  HENT:     Head: Normocephalic and atraumatic.  Cardiovascular:     Rate and Rhythm: Normal rate and regular rhythm.     Heart sounds: Normal heart sounds.  Pulmonary:     Effort: Pulmonary effort is normal.     Breath sounds: Normal breath sounds.  Musculoskeletal:     Comments: 1+ pitting edema with mild erythema anteriorly with thickening of the skin.   Skin:    General: Skin is warm and dry.  Neurological:     Mental Status: She is alert and oriented to person, place, and time.  Psychiatric:        Behavior: Behavior normal.     BP 132/80   Pulse 65   Ht 5\' 2"  (1.575 m)   Wt 211 lb (95.7 kg)   SpO2 96%   BMI 38.59 kg/m  Wt Readings from Last 3 Encounters:  05/28/20 211 lb (95.7 kg)  04/30/20 212 lb 3.2 oz (96.3 kg)  04/23/20 217 lb 6.4 oz (98.6 kg)     There are no preventive care reminders to display for this patient.  There are no preventive care reminders to display for this patient.  Lab Results  Component Value Date   TSH 4.36 04/12/2020   Lab Results  Component Value Date   WBC 6.2 06/30/2019   HGB 13.8 06/30/2019   HCT 41.0 06/30/2019    MCV 90.1 06/30/2019   PLT 187 06/30/2019   Lab Results  Component Value Date   NA 142 04/30/2020   K 3.6 04/30/2020   CO2 32 04/30/2020   GLUCOSE 112 (H) 04/30/2020   BUN 17 04/30/2020   CREATININE 0.76 04/30/2020  BILITOT 0.9 06/30/2019   ALKPHOS 119 08/25/2016   AST 24 06/30/2019   ALT 35 (H) 06/30/2019   PROT 7.1 06/30/2019   ALBUMIN 4.4 08/25/2016   CALCIUM 10.1 04/30/2020   Lab Results  Component Value Date   CHOL 180 06/30/2019   Lab Results  Component Value Date   HDL 51 06/30/2019   Lab Results  Component Value Date   LDLCALC 105 (H) 06/30/2019   Lab Results  Component Value Date   TRIG 147 06/30/2019   Lab Results  Component Value Date   CHOLHDL 3.5 06/30/2019   Lab Results  Component Value Date   HGBA1C 5.6 10/25/2019      Assessment & Plan:   Problem List Items Addressed This Visit      Cardiovascular and Mediastinum   Essential hypertension    Well controlled. Continue current regimen. Follow up in 6 mo        Relevant Orders   TSH   Lipid Panel w/reflex Direct LDL   COMPLETE METABOLIC PANEL WITH GFR     Digestive   Fatty liver    Plan to recheck liver enzymes         Endocrine   Hypothyroidism    We increased her dose by extra half a tab one day a week about 8 weeks ago. Due to recheck TSH.  Will order labs and she will go in the next couple of week.        Relevant Orders   TSH   Lipid Panel w/reflex Direct LDL   COMPLETE METABOLIC PANEL WITH GFR     Other   Localized swelling of both lower extremities - Primary    Continue with 3 tabs of furosemide and tke the potassium with it. Will renew the rx for potassium. Make sure to wear the stocking daily.        Relevant Orders   TSH   Lipid Panel w/reflex Direct LDL   COMPLETE METABOLIC PANEL WITH GFR    Other Visit Diagnoses    Venous stasis       Lightheaded          Meds ordered this encounter  Medications  . potassium chloride (KLOR-CON) 10 MEQ tablet     Sig: Take 1 tablet (10 mEq total) by mouth daily as needed.    Dispense:  90 tablet    Refill:  1    Follow-up: Return in about 3 months (around 08/28/2020) for Hypertension.    Beatrice Lecher, MD

## 2020-06-14 ENCOUNTER — Other Ambulatory Visit: Payer: Self-pay | Admitting: Family Medicine

## 2020-06-14 DIAGNOSIS — E890 Postprocedural hypothyroidism: Secondary | ICD-10-CM

## 2020-06-14 DIAGNOSIS — G8929 Other chronic pain: Secondary | ICD-10-CM

## 2020-06-14 DIAGNOSIS — M5136 Other intervertebral disc degeneration, lumbar region: Secondary | ICD-10-CM

## 2020-06-14 DIAGNOSIS — I10 Essential (primary) hypertension: Secondary | ICD-10-CM

## 2020-06-19 DIAGNOSIS — H40013 Open angle with borderline findings, low risk, bilateral: Secondary | ICD-10-CM | POA: Diagnosis not present

## 2020-06-27 DIAGNOSIS — R8271 Bacteriuria: Secondary | ICD-10-CM | POA: Diagnosis not present

## 2020-06-27 DIAGNOSIS — N200 Calculus of kidney: Secondary | ICD-10-CM | POA: Diagnosis not present

## 2020-07-10 DIAGNOSIS — I10 Essential (primary) hypertension: Secondary | ICD-10-CM | POA: Diagnosis not present

## 2020-07-10 DIAGNOSIS — M7989 Other specified soft tissue disorders: Secondary | ICD-10-CM | POA: Diagnosis not present

## 2020-07-10 DIAGNOSIS — E890 Postprocedural hypothyroidism: Secondary | ICD-10-CM | POA: Diagnosis not present

## 2020-07-10 NOTE — Patient Instructions (Addendum)
DUE TO COVID-19 ONLY ONE VISITOR IS ALLOWED TO COME WITH YOU AND STAY IN THE WAITING ROOM ONLY DURING PRE OP AND PROCEDURE DAY OF SURGERY. THE 1 VISITOR  MAY VISIT WITH YOU AFTER SURGERY IN YOUR PRIVATE ROOM DURING VISITING HOURS ONLY!  YOU NEED TO HAVE A COVID 19 TEST ON__5/10_____ @__11 :15_____, THIS TEST MUST BE DONE BEFORE SURGERY,  COVID TESTING SITE [place] [id number], IT IS ON THE RIGHT GOING OUT [address] APPROXIMATELY  2 MINUTES PAST [employer] ON THE RIGHT. ONCE YOUR COVID TEST IS COMPLETED,  PLEASE BEGIN THE QUARANTINE INSTRUCTIONS AS OUTLINED IN YOUR HANDOUT.                Masiya Claassen    Your procedure is scheduled on: 07/19/20   Report to [facility] Main  Entrance   Report to admitting at   7:30 AM     Call this number if you have problems the morning of surgery [phone]    . BRUSH YOUR TEETH MORNING OF SURGERY AND RINSE YOUR MOUTH OUT, NO CHEWING GUM CANDY OR MINTS.   No food after midnight.    You may have clear liquid until 7:00  AM.    At 6:30 AM drink pre surgery drink.   Nothing by mouth after 7:00 AM.   Take these medicines the morning of surgery with A SIP OF WATER: Metoprolol, Flonase, Levothyroxine                                                                                                                 Use your inhalers and bring them with you                                 You may not have any metal on your body including hair pins and              piercings  Do not wear jewelry, make-up, lotions, powders or perfumes, deodorant             Do not wear nail polish on your fingernails.  Do not shave  48 hours prior to surgery.  .   Do not bring valuables to the hospital. [place].  Contacts, dentures or bridgework may not be worn into surgery.      Patients discharged the day of surgery will not be allowed to drive home.   IF YOU ARE  HAVING SURGERY AND GOING HOME THE SAME DAY, YOU MUST HAVE AN ADULT TO DRIVE YOU HOME AND BE WITH YOU FOR 24 HOURS.  YOU MAY GO HOME BY TAXI OR UBER OR ORTHERWISE, BUT AN ADULT MUST ACCOMPANY YOU HOME AND STAY WITH YOU FOR 24 HOURS.  Name and phone number of your driver:  Special Instructions: N/A  Please read over the following fact sheets you were given: _____________________________________________________________________             [facility]- Preparing for Total Shoulder Arthroplasty    Before surgery, you can play an important role. Because skin is not sterile, your skin needs to be as free of germs as possible. You can reduce the number of germs on your skin by using the following products. . Benzoyl Peroxide Gel o Reduces the number of germs present on the skin o Applied twice a day to shoulder area starting two days before surgery    ==================================================================  Please follow these instructions carefully:  BENZOYL PEROXIDE 5% GEL  Please do not use if you have an allergy to benzoyl peroxide.   If your skin becomes reddened/irritated stop using the benzoyl peroxide.  Starting two days before surgery, apply as follows: 1. Apply benzoyl peroxide in the morning and at night. Apply after taking a shower. If you are not taking a shower clean entire shoulder front, back, and side along with the armpit with a clean wet washcloth.  2. Place a quarter-sized dollop on your shoulder and rub in thoroughly, making sure to cover the front, back, and side of your shoulder, along with the armpit.   2 days before ____ AM   ____ PM              1 day before ____ AM   ____ PM                         3. Do this twice a day for two days.  (Last application is the night before surgery, AFTER using the CHG soap as described below).  4. Do NOT apply benzoyl peroxide gel on the day of surgery. [place] - Preparing for Surgery Before surgery,  you can play an important role.  Because skin is not sterile, your skin needs to be as free of germs as possible.  You can reduce the number of germs on your skin by washing with CHG (chlorahexidine gluconate) soap before surgery.  CHG is an antiseptic cleaner which kills germs and bonds with the skin to continue killing germs even after washing. Please DO NOT use if you have an allergy to CHG or antibacterial soaps.  If your skin becomes reddened/irritated stop using the CHG and inform your nurse when you arrive at Short Stay. Do not shave (including legs and underarms) for at least 48 hours prior to the first CHG shower.    Please follow these instructions carefully:  1.  Shower with CHG Soap the night before surgery and the  morning of Surgery.  2.  If you choose to wash your hair, wash your hair first as usual with your  normal  shampoo.  3.  After you shampoo, rinse your hair and body thoroughly to remove the  shampoo.                                        4.  Use CHG as you would any other liquid soap.  You can apply chg directly  to the skin and wash                       Gently with a scrungie or clean washcloth.  5.  Apply the CHG Soap to your body  ONLY FROM THE NECK DOWN.   Do not use on face/ open                           Wound or open sores. Avoid contact with eyes, ears mouth and genitals (private parts).                       Wash face,  Genitals (private parts) with your normal soap.             6.  Wash thoroughly, paying special attention to the area where your surgery  will be performed.  7.  Thoroughly rinse your body with warm water from the neck down.  8.  DO NOT shower/wash with your normal soap after using and rinsing off  the CHG Soap.             9.  Pat yourself dry with a clean towel.            10.  Wear clean pajamas.            11.  Place clean sheets on your bed the night of your first shower and do not  sleep with pets. Day of Surgery : Do not apply any  lotions/deodorants the morning of surgery.  Please wear clean clothes to the hospital/surgery center.  FAILURE TO FOLLOW THESE INSTRUCTIONS MAY RESULT IN THE CANCELLATION OF YOUR SURGERY PATIENT SIGNATURE_________________________________  NURSE SIGNATURE__________________________________  ________________________________________________________________________   Adam Phenix  An incentive spirometer is a tool that can help keep your lungs clear and active. This tool measures how well you are filling your lungs with each breath. Taking long deep breaths may help reverse or decrease the chance of developing breathing (pulmonary) problems (especially infection) following:  A long period of time when you are unable to move or be active. BEFORE THE PROCEDURE   If the spirometer includes an indicator to show your best effort, your nurse or respiratory therapist will set it to a desired goal.  If possible, sit up straight or lean slightly forward. Try not to slouch.  Hold the incentive spirometer in an upright position. INSTRUCTIONS FOR USE  1. Sit on the edge of your bed if possible, or sit up as far as you can in bed or on a chair. 2. Hold the incentive spirometer in an upright position. 3. Breathe out normally. 4. Place the mouthpiece in your mouth and seal your lips tightly around it. 5. Breathe in slowly and as deeply as possible, raising the piston or the ball toward the top of the column. 6. Hold your breath for 3-5 seconds or for as long as possible. Allow the piston or ball to fall to the bottom of the column. 7. Remove the mouthpiece from your mouth and breathe out normally. 8. Rest for a few seconds and repeat Steps 1 through 7 at least 10 times every 1-2 hours when you are awake. Take your time and take a few normal breaths between deep breaths. 9. The spirometer may include an indicator to show your best effort. Use the indicator as a goal to work toward during each  repetition. 10. After each set of 10 deep breaths, practice coughing to be sure your lungs are clear. If you have an incision (the cut made at the time of surgery), support your incision when coughing by placing a pillow or rolled up towels firmly against it. Once you are  able to get out of bed, walk around indoors and cough well. You may stop using the incentive spirometer when instructed by your caregiver.  RISKS AND COMPLICATIONS  Take your time so you do not get dizzy or light-headed.  If you are in pain, you may need to take or ask for pain medication before doing incentive spirometry. It is harder to take a deep breath if you are having pain. AFTER USE  Rest and breathe slowly and easily.  It can be helpful to keep track of a log of your progress. Your caregiver can provide you with a simple table to help with this. If you are using the spirometer at home, follow these instructions: [place] IF:   You are having difficultly using the spirometer.  You have trouble using the spirometer as often as instructed.  Your pain medication is not giving enough relief while using the spirometer.  You develop fever of 100.5 F (38.1 C) or higher. SEEK IMMEDIATE MEDICAL CARE IF:   You cough up bloody sputum that had not been present before.  You develop fever of 102 F (38.9 C) or greater.  You develop worsening pain at or near the incision site. MAKE SURE YOU:   Understand these instructions.  Will watch your condition.  Will get help right away if you are not doing well or get worse. Document Released: 07/07/2006 Document Revised: 05/19/2011 Document Reviewed: 09/07/2006 [facility] Patient Information 2014 [place], [place].   ________________________________________________________________________

## 2020-07-11 ENCOUNTER — Encounter (HOSPITAL_COMMUNITY): Payer: Self-pay

## 2020-07-11 ENCOUNTER — Encounter (HOSPITAL_COMMUNITY)
Admission: RE | Admit: 2020-07-11 | Discharge: 2020-07-11 | Disposition: A | Discharge status: Home / Self Care | Payer: Medicare Other | Source: Ambulatory Visit | Attending: Orthopedic Surgery | Admitting: Orthopedic Surgery

## 2020-07-11 ENCOUNTER — Other Ambulatory Visit: Payer: Self-pay

## 2020-07-11 DIAGNOSIS — Z01812 Encounter for preprocedural laboratory examination: Secondary | ICD-10-CM | POA: Diagnosis not present

## 2020-07-11 HISTORY — DX: Hypothyroidism, unspecified: E03.9

## 2020-07-11 HISTORY — DX: Cardiac murmur, unspecified: R01.1

## 2020-07-11 HISTORY — DX: Personal history of urinary calculi: Z87.442

## 2020-07-11 HISTORY — DX: Nausea with vomiting, unspecified: R11.2

## 2020-07-11 HISTORY — DX: Dyspnea, unspecified: R06.00

## 2020-07-11 HISTORY — DX: Unspecified asthma, uncomplicated: J45.909

## 2020-07-11 HISTORY — DX: Unspecified osteoarthritis, unspecified site: M19.90

## 2020-07-11 HISTORY — DX: Cardiac arrhythmia, unspecified: I49.9

## 2020-07-11 HISTORY — DX: Other complications of anesthesia, initial encounter: T88.59XA

## 2020-07-11 HISTORY — DX: Respiratory tuberculosis unspecified: A15.9

## 2020-07-11 HISTORY — DX: Other specified postprocedural states: Z98.890

## 2020-07-11 LAB — CBC
HCT: 41.2 % (ref 36.0–46.0)
Hemoglobin: 13.7 g/dL (ref 12.0–15.0)
MCH: 31.1 pg (ref 26.0–34.0)
MCHC: 33.3 g/dL (ref 30.0–36.0)
MCV: 93.6 fL (ref 80.0–100.0)
Platelets: 206 10*3/uL (ref 150–400)
RBC: 4.4 MIL/uL (ref 3.87–5.11)
RDW: 12.4 % (ref 11.5–15.5)
WBC: 5.5 10*3/uL (ref 4.0–10.5)
nRBC: 0 % (ref 0.0–0.2)

## 2020-07-11 LAB — COMPLETE METABOLIC PANEL WITH GFR
AG Ratio: 2 (calc) (ref 1.0–2.5)
ALT: 38 U/L — ABNORMAL HIGH (ref 6–29)
AST: 26 U/L (ref 10–35)
Albumin: 4.5 g/dL (ref 3.6–5.1)
Alkaline phosphatase (APISO): 86 U/L (ref 37–153)
BUN: 14 mg/dL (ref 7–25)
CO2: 27 mmol/L (ref 20–32)
Calcium: 9.7 mg/dL (ref 8.6–10.4)
Chloride: 104 mmol/L (ref 98–110)
Creat: 0.7 mg/dL (ref 0.60–0.93)
GFR, Est African American: 100 mL/min/{1.73_m2} (ref >=60)
GFR, Est Non African American: 87 mL/min/{1.73_m2} (ref >=60)
Globulin: 2.3 g/dL (calc) (ref 1.9–3.7)
Glucose, Bld: 101 mg/dL — ABNORMAL HIGH (ref 65–99)
Potassium: 4 mmol/L (ref 3.5–5.3)
Sodium: 142 mmol/L (ref 135–146)
Total Bilirubin: 0.9 mg/dL (ref 0.2–1.2)
Total Protein: 6.8 g/dL (ref 6.1–8.1)

## 2020-07-11 LAB — SURGICAL PCR SCREEN
MRSA, PCR: NEGATIVE
Staphylococcus aureus: NEGATIVE

## 2020-07-11 LAB — LIPID PANEL W/REFLEX DIRECT LDL
Cholesterol: 192 mg/dL (ref <200)
HDL: 46 mg/dL — ABNORMAL LOW (ref >=50)
LDL Cholesterol (Calc): 117 mg/dL (calc) — ABNORMAL HIGH
Non-HDL Cholesterol (Calc): 146 mg/dL (calc) — ABNORMAL HIGH (ref <130)
Total CHOL/HDL Ratio: 4.2 (calc) (ref <5.0)
Triglycerides: 171 mg/dL — ABNORMAL HIGH (ref <150)

## 2020-07-11 LAB — TSH: TSH: 0.46 mIU/L (ref 0.40–4.50)

## 2020-07-11 NOTE — Progress Notes (Signed)
COVID Vaccine Completed:Yes Date COVID Vaccine completed:07/12/19-booster 01/24/20 COVID vaccine manufacturer:   Moderna    PCP - Dr. Peterson Ao Cardiologist - no  Chest x-ray - no EKG - 10/24/19-epic Stress Test - no ECHO - 12/30/18-epic Cardiac Cath - no Pacemaker/ICD device last checked:NA  Sleep Study - no CPAP -   Fasting Blood Sugar - NA Checks Blood Sugar _____ times a day  Blood Thinner Instructions:NA Aspirin Instructions: Last Dose:  Anesthesia review:   Patient denies shortness of breath, fever, cough and chest pain at PAT appointment Pt has asthma and BMI of 38.4. She can climb one flight of stairs. She sometimes gets SOB doing housework and ADLs.She takes a diuretic for peripheral edema but denies CHF. She had A-fib once that the Drs felt was related to her thyroid problems  Patient verbalized understanding of instructions that were given to them at the PAT appointment. Patient was also instructed that they will need to review over the PAT instructions again at home before surgery.Yes

## 2020-07-13 ENCOUNTER — Other Ambulatory Visit: Payer: Self-pay | Admitting: *Deleted

## 2020-07-13 ENCOUNTER — Other Ambulatory Visit: Payer: Self-pay | Admitting: Family Medicine

## 2020-07-13 DIAGNOSIS — M5136 Other intervertebral disc degeneration, lumbar region: Secondary | ICD-10-CM

## 2020-07-13 DIAGNOSIS — R7989 Other specified abnormal findings of blood chemistry: Secondary | ICD-10-CM

## 2020-07-13 DIAGNOSIS — G8929 Other chronic pain: Secondary | ICD-10-CM

## 2020-07-14 ENCOUNTER — Encounter: Payer: Self-pay | Admitting: Family Medicine

## 2020-07-17 ENCOUNTER — Other Ambulatory Visit (HOSPITAL_COMMUNITY)
Admission: RE | Admit: 2020-07-17 | Discharge: 2020-07-17 | Disposition: A | Discharge status: Home / Self Care | Payer: Medicare Other | Source: Ambulatory Visit | Attending: Orthopedic Surgery | Admitting: Orthopedic Surgery

## 2020-07-17 DIAGNOSIS — Z01812 Encounter for preprocedural laboratory examination: Secondary | ICD-10-CM | POA: Insufficient documentation

## 2020-07-17 DIAGNOSIS — Z20822 Contact with and (suspected) exposure to covid-19: Secondary | ICD-10-CM | POA: Insufficient documentation

## 2020-07-17 LAB — SARS CORONAVIRUS 2 (TAT 6-24 HRS): SARS Coronavirus 2: NEGATIVE

## 2020-07-19 ENCOUNTER — Ambulatory Visit (HOSPITAL_COMMUNITY): Payer: Medicare Other | Admitting: Anesthesiology

## 2020-07-19 ENCOUNTER — Encounter (HOSPITAL_COMMUNITY): Payer: Self-pay | Admitting: Orthopedic Surgery

## 2020-07-19 ENCOUNTER — Ambulatory Visit (HOSPITAL_COMMUNITY): Payer: Medicare Other | Admitting: Physician Assistant

## 2020-07-19 ENCOUNTER — Ambulatory Visit (HOSPITAL_COMMUNITY)
Admission: RE | Admit: 2020-07-19 | Discharge: 2020-07-19 | Disposition: A | Discharge status: Home / Self Care | Payer: Medicare Other | Source: Ambulatory Visit | Attending: Orthopedic Surgery | Admitting: Orthopedic Surgery

## 2020-07-19 ENCOUNTER — Encounter (HOSPITAL_COMMUNITY)
Admission: RE | Disposition: A | Discharge status: Home / Self Care | Payer: Self-pay | Source: Ambulatory Visit | Attending: Orthopedic Surgery

## 2020-07-19 DIAGNOSIS — Z791 Long term (current) use of non-steroidal anti-inflammatories (NSAID): Secondary | ICD-10-CM | POA: Diagnosis not present

## 2020-07-19 DIAGNOSIS — E039 Hypothyroidism, unspecified: Secondary | ICD-10-CM | POA: Diagnosis not present

## 2020-07-19 DIAGNOSIS — M19012 Primary osteoarthritis, left shoulder: Secondary | ICD-10-CM | POA: Diagnosis not present

## 2020-07-19 DIAGNOSIS — I480 Paroxysmal atrial fibrillation: Secondary | ICD-10-CM | POA: Insufficient documentation

## 2020-07-19 DIAGNOSIS — M5136 Other intervertebral disc degeneration, lumbar region: Secondary | ICD-10-CM

## 2020-07-19 DIAGNOSIS — Z8349 Family history of other endocrine, nutritional and metabolic diseases: Secondary | ICD-10-CM | POA: Diagnosis not present

## 2020-07-19 DIAGNOSIS — E890 Postprocedural hypothyroidism: Secondary | ICD-10-CM | POA: Insufficient documentation

## 2020-07-19 DIAGNOSIS — M21922 Unspecified acquired deformity of left upper arm: Secondary | ICD-10-CM | POA: Insufficient documentation

## 2020-07-19 DIAGNOSIS — Z85828 Personal history of other malignant neoplasm of skin: Secondary | ICD-10-CM | POA: Insufficient documentation

## 2020-07-19 DIAGNOSIS — Z8249 Family history of ischemic heart disease and other diseases of the circulatory system: Secondary | ICD-10-CM | POA: Insufficient documentation

## 2020-07-19 DIAGNOSIS — E785 Hyperlipidemia, unspecified: Secondary | ICD-10-CM | POA: Diagnosis not present

## 2020-07-19 DIAGNOSIS — Z96653 Presence of artificial knee joint, bilateral: Secondary | ICD-10-CM | POA: Diagnosis not present

## 2020-07-19 DIAGNOSIS — G8918 Other acute postprocedural pain: Secondary | ICD-10-CM | POA: Diagnosis not present

## 2020-07-19 DIAGNOSIS — I10 Essential (primary) hypertension: Secondary | ICD-10-CM | POA: Insufficient documentation

## 2020-07-19 DIAGNOSIS — Z79899 Other long term (current) drug therapy: Secondary | ICD-10-CM | POA: Insufficient documentation

## 2020-07-19 DIAGNOSIS — G8929 Other chronic pain: Secondary | ICD-10-CM

## 2020-07-19 DIAGNOSIS — Z79890 Hormone replacement therapy (postmenopausal): Secondary | ICD-10-CM | POA: Insufficient documentation

## 2020-07-19 HISTORY — PX: REVERSE SHOULDER ARTHROPLASTY: SHX5054

## 2020-07-19 SURGERY — ARTHROPLASTY, SHOULDER, TOTAL, REVERSE
Anesthesia: General | Site: Shoulder | Laterality: Left

## 2020-07-19 MED ORDER — AMISULPRIDE (ANTIEMETIC) 5 MG/2ML IV SOLN: 10.0000 mg | Freq: Once | INTRAVENOUS | Status: DC | PRN

## 2020-07-19 MED ORDER — ONDANSETRON HCL 4 MG PO TABS: 4.0000 mg | ORAL_TABLET | Freq: Three times a day (TID) | ORAL | 0 refills | Status: DC | PRN

## 2020-07-19 MED ORDER — PROMETHAZINE HCL 25 MG/ML IJ SOLN: 6.2500 mg | INTRAMUSCULAR | Status: DC | PRN

## 2020-07-19 MED ORDER — DEXAMETHASONE SODIUM PHOSPHATE 10 MG/ML IJ SOLN
INTRAMUSCULAR | Status: AC
  Filled 2020-07-19: qty 1

## 2020-07-19 MED ORDER — ONDANSETRON HCL 4 MG PO TABS
4.0000 mg | ORAL_TABLET | Freq: Four times a day (QID) | ORAL | Status: DC | PRN
  Filled 2020-07-19: qty 1

## 2020-07-19 MED ORDER — OXYCODONE HCL 5 MG/5ML PO SOLN: 5.0000 mg | Freq: Once | ORAL | Status: DC | PRN

## 2020-07-19 MED ORDER — TRANEXAMIC ACID-NACL 1000-0.7 MG/100ML-% IV SOLN
1000.0000 mg | INTRAVENOUS | Status: AC
  Administered 2020-07-19: 1000 mg via INTRAVENOUS
  Filled 2020-07-19: qty 100

## 2020-07-19 MED ORDER — SUGAMMADEX SODIUM 200 MG/2ML IV SOLN
INTRAVENOUS | Status: DC | PRN
  Administered 2020-07-19: 200 mg via INTRAVENOUS

## 2020-07-19 MED ORDER — METOCLOPRAMIDE HCL 5 MG PO TABS
5.0000 mg | ORAL_TABLET | Freq: Three times a day (TID) | ORAL | Status: DC | PRN
Start: 2020-07-19 — End: 2020-07-19
  Filled 2020-07-19: qty 2

## 2020-07-19 MED ORDER — OXYCODONE HCL 5 MG PO TABS: 5.0000 mg | ORAL_TABLET | Freq: Once | ORAL | Status: DC | PRN

## 2020-07-19 MED ORDER — METOCLOPRAMIDE HCL 5 MG/ML IJ SOLN: 5.0000 mg | Freq: Three times a day (TID) | INTRAMUSCULAR | Status: DC | PRN

## 2020-07-19 MED ORDER — CEFAZOLIN SODIUM-DEXTROSE 2-4 GM/100ML-% IV SOLN
2.0000 g | INTRAVENOUS | Status: AC
  Administered 2020-07-19: 2 g via INTRAVENOUS
  Filled 2020-07-19: qty 100

## 2020-07-19 MED ORDER — FENTANYL CITRATE (PF) 100 MCG/2ML IJ SOLN
INTRAMUSCULAR | Status: DC | PRN
  Administered 2020-07-19: 50 ug via INTRAVENOUS

## 2020-07-19 MED ORDER — BUPIVACAINE HCL (PF) 0.5 % IJ SOLN
INTRAMUSCULAR | Status: DC | PRN
  Administered 2020-07-19: 15 mL via PERINEURAL

## 2020-07-19 MED ORDER — ONDANSETRON HCL 4 MG/2ML IJ SOLN
INTRAMUSCULAR | Status: AC
  Filled 2020-07-19: qty 2

## 2020-07-19 MED ORDER — ROCURONIUM BROMIDE 10 MG/ML (PF) SYRINGE
PREFILLED_SYRINGE | INTRAVENOUS | Status: AC
  Filled 2020-07-19: qty 10

## 2020-07-19 MED ORDER — OXYCODONE-ACETAMINOPHEN 5-325 MG PO TABS: 1.0000 | ORAL_TABLET | ORAL | 0 refills | Status: DC | PRN

## 2020-07-19 MED ORDER — LACTATED RINGERS IV BOLUS
500.0000 mL | Freq: Once | INTRAVENOUS | Status: AC
  Administered 2020-07-19: 500 mL via INTRAVENOUS

## 2020-07-19 MED ORDER — SODIUM CHLORIDE 0.9 % IR SOLN
Status: DC | PRN
  Administered 2020-07-19: 1000 mL

## 2020-07-19 MED ORDER — FENTANYL CITRATE (PF) 100 MCG/2ML IJ SOLN: 25.0000 ug | INTRAMUSCULAR | Status: DC | PRN

## 2020-07-19 MED ORDER — LACTATED RINGERS IV SOLN: INTRAVENOUS | Status: DC

## 2020-07-19 MED ORDER — LIDOCAINE 2% (20 MG/ML) 5 ML SYRINGE
INTRAMUSCULAR | Status: AC
  Filled 2020-07-19: qty 5

## 2020-07-19 MED ORDER — ROCURONIUM BROMIDE 10 MG/ML (PF) SYRINGE
PREFILLED_SYRINGE | INTRAVENOUS | Status: DC | PRN
  Administered 2020-07-19: 70 mg via INTRAVENOUS

## 2020-07-19 MED ORDER — MIDAZOLAM HCL 2 MG/2ML IJ SOLN
INTRAMUSCULAR | Status: AC
  Administered 2020-07-19: 1 mg
  Filled 2020-07-19: qty 2

## 2020-07-19 MED ORDER — CYCLOBENZAPRINE HCL 10 MG PO TABS: 10.0000 mg | ORAL_TABLET | Freq: Three times a day (TID) | ORAL | 1 refills | Status: DC | PRN

## 2020-07-19 MED ORDER — LIDOCAINE 2% (20 MG/ML) 5 ML SYRINGE
INTRAMUSCULAR | Status: DC | PRN
  Administered 2020-07-19: 50 mg via INTRAVENOUS

## 2020-07-19 MED ORDER — EPHEDRINE SULFATE-NACL 50-0.9 MG/10ML-% IV SOSY
PREFILLED_SYRINGE | INTRAVENOUS | Status: DC | PRN
  Administered 2020-07-19 (×2): 10 mg via INTRAVENOUS

## 2020-07-19 MED ORDER — VANCOMYCIN HCL 1000 MG IV SOLR
INTRAVENOUS | Status: AC
  Filled 2020-07-19: qty 1000

## 2020-07-19 MED ORDER — TRAMADOL HCL 50 MG PO TABS: 50.0000 mg | ORAL_TABLET | Freq: Four times a day (QID) | ORAL | 0 refills | Status: DC | PRN

## 2020-07-19 MED ORDER — ONDANSETRON HCL 4 MG/2ML IJ SOLN: 4.0000 mg | Freq: Four times a day (QID) | INTRAMUSCULAR | Status: DC | PRN

## 2020-07-19 MED ORDER — DEXAMETHASONE SODIUM PHOSPHATE 10 MG/ML IJ SOLN
INTRAMUSCULAR | Status: DC | PRN
  Administered 2020-07-19: 8 mg via INTRAVENOUS

## 2020-07-19 MED ORDER — VANCOMYCIN HCL 1000 MG IV SOLR
INTRAVENOUS | Status: DC | PRN
  Administered 2020-07-19: 1000 mg

## 2020-07-19 MED ORDER — ORAL CARE MOUTH RINSE: 15.0000 mL | Freq: Once | OROMUCOSAL | Status: AC

## 2020-07-19 MED ORDER — ACETAMINOPHEN 500 MG PO TABS
1000.0000 mg | ORAL_TABLET | Freq: Once | ORAL | Status: AC
Start: 2020-07-19 — End: 2020-07-19
  Administered 2020-07-19: 1000 mg via ORAL
  Filled 2020-07-19: qty 2

## 2020-07-19 MED ORDER — FENTANYL CITRATE (PF) 100 MCG/2ML IJ SOLN
INTRAMUSCULAR | Status: AC
  Filled 2020-07-19: qty 2

## 2020-07-19 MED ORDER — BUPIVACAINE LIPOSOME 1.3 % IJ SUSP
INTRAMUSCULAR | Status: DC | PRN
  Administered 2020-07-19: 10 mL via PERINEURAL

## 2020-07-19 MED ORDER — CHLORHEXIDINE GLUCONATE 0.12 % MT SOLN
15.0000 mL | Freq: Once | OROMUCOSAL | Status: AC
  Administered 2020-07-19: 15 mL via OROMUCOSAL

## 2020-07-19 MED ORDER — PROMETHAZINE HCL 25 MG/ML IJ SOLN
INTRAMUSCULAR | Status: AC
  Administered 2020-07-19: 6.25 mg via INTRAVENOUS
  Filled 2020-07-19: qty 1

## 2020-07-19 MED ORDER — ONDANSETRON HCL 4 MG/2ML IJ SOLN
INTRAMUSCULAR | Status: DC | PRN
  Administered 2020-07-19: 4 mg via INTRAVENOUS

## 2020-07-19 MED ORDER — LACTATED RINGERS IV BOLUS
250.0000 mL | Freq: Once | INTRAVENOUS | Status: AC
  Administered 2020-07-19: 250 mL via INTRAVENOUS

## 2020-07-19 MED ORDER — FENTANYL CITRATE (PF) 100 MCG/2ML IJ SOLN
INTRAMUSCULAR | Status: AC
  Administered 2020-07-19: 50 ug
  Filled 2020-07-19: qty 2

## 2020-07-19 MED ORDER — PROPOFOL 10 MG/ML IV BOLUS
INTRAVENOUS | Status: DC | PRN
  Administered 2020-07-19: 130 mg via INTRAVENOUS

## 2020-07-19 MED ORDER — MELOXICAM 15 MG PO TABS: 15.0000 mg | ORAL_TABLET | Freq: Every day | ORAL | 1 refills | Status: DC

## 2020-07-19 SURGICAL SUPPLY — 66 items
ADH SKN CLS APL DERMABOND .7 (GAUZE/BANDAGES/DRESSINGS) ×1
AID PSTN UNV HD RSTRNT DISP (MISCELLANEOUS) ×1
BAG SPEC THK2 15X12 ZIP CLS (MISCELLANEOUS) ×1
BAG ZIPLOCK 12X15 (MISCELLANEOUS) ×2 IMPLANT
BLADE SAW SGTL 83.5X18.5 (BLADE) ×2 IMPLANT
BSPLAT GLND +2X24 MDLR (Joint) ×1 IMPLANT
COOLER ICEMAN CLASSIC (MISCELLANEOUS) ×2 IMPLANT
COVER BACK TABLE 60X90IN (DRAPES) ×2 IMPLANT
COVER SURGICAL LIGHT HANDLE (MISCELLANEOUS) ×2 IMPLANT
COVER WAND RF STERILE (DRAPES) IMPLANT
CUP SUT UNIV REVERS 36 NEUTRAL (Cup) ×2 IMPLANT
DERMABOND ADVANCED (GAUZE/BANDAGES/DRESSINGS) ×1
DERMABOND ADVANCED .7 DNX12 (GAUZE/BANDAGES/DRESSINGS) ×1 IMPLANT
DRAPE INCISE IOBAN 66X45 STRL (DRAPES) IMPLANT
DRAPE ORTHO SPLIT 77X108 STRL (DRAPES) ×4
DRAPE SHEET LG 3/4 BI-LAMINATE (DRAPES) ×2 IMPLANT
DRAPE SURG 17X11 SM STRL (DRAPES) ×2 IMPLANT
DRAPE SURG ORHT 6 SPLT 77X108 (DRAPES) ×2 IMPLANT
DRAPE U-SHAPE 47X51 STRL (DRAPES) ×2 IMPLANT
DRESSING AQUACEL AG SP 3.5X6 (GAUZE/BANDAGES/DRESSINGS) ×1 IMPLANT
DRSG AQUACEL AG ADV 3.5X10 (GAUZE/BANDAGES/DRESSINGS) IMPLANT
DRSG AQUACEL AG SP 3.5X6 (GAUZE/BANDAGES/DRESSINGS) ×2
DURAPREP 26ML APPLICATOR (WOUND CARE) ×2 IMPLANT
ELECT BLADE TIP CTD 4 INCH (ELECTRODE) ×2 IMPLANT
ELECT REM PT RETURN 15FT ADLT (MISCELLANEOUS) ×2 IMPLANT
FACESHIELD WRAPAROUND (MASK) ×8 IMPLANT
GLENOID UNI REV MOD 24 +2 LAT (Joint) ×2 IMPLANT
GLENOSPHERE 36 +4 LAT/24 (Joint) ×2 IMPLANT
GLOVE SRG 8 PF TXTR STRL LF DI (GLOVE) ×1 IMPLANT
GLOVE SURG ENC MOIS LTX SZ7 (GLOVE) ×2 IMPLANT
GLOVE SURG ENC MOIS LTX SZ7.5 (GLOVE) ×2 IMPLANT
GLOVE SURG UNDER POLY LF SZ7 (GLOVE) ×2 IMPLANT
GLOVE SURG UNDER POLY LF SZ8 (GLOVE) ×2
GOWN STRL REUS W/TWL LRG LVL3 (GOWN DISPOSABLE) ×4 IMPLANT
KIT BASIN OR (CUSTOM PROCEDURE TRAY) ×2 IMPLANT
KIT TURNOVER KIT A (KITS) ×2 IMPLANT
LINER HUMERAL 36 +3MM SM (Shoulder) ×2 IMPLANT
MANIFOLD NEPTUNE II (INSTRUMENTS) ×2 IMPLANT
NEEDLE TAPERED W/ NITINOL LOOP (MISCELLANEOUS) ×4 IMPLANT
NS IRRIG 1000ML POUR BTL (IV SOLUTION) ×2 IMPLANT
PACK SHOULDER (CUSTOM PROCEDURE TRAY) ×2 IMPLANT
PAD ARMBOARD 7.5X6 YLW CONV (MISCELLANEOUS) ×2 IMPLANT
PAD COLD SHLDR WRAP-ON (PAD) ×2 IMPLANT
PIN NITINOL TARGETER 2.8 (PIN) IMPLANT
PIN SET MODULAR GLENOID SYSTEM (PIN) ×2 IMPLANT
RESTRAINT HEAD UNIVERSAL NS (MISCELLANEOUS) ×2 IMPLANT
SCREW CENTRAL MODULAR 25 (Screw) ×2 IMPLANT
SCREW PERI LOCK 5.5X16 (Screw) ×2 IMPLANT
SCREW PERI LOCK 5.5X32 (Screw) ×4 IMPLANT
SCREW PERIPHERAL 5.5X20 LOCK (Screw) ×2 IMPLANT
SLING ARM FOAM STRAP LRG (SOFTGOODS) ×2 IMPLANT
SLING ARM FOAM STRAP MED (SOFTGOODS) IMPLANT
SPONGE LAP 18X18 RF (DISPOSABLE) ×2 IMPLANT
STEM HUMERAL MOD SZ 5 135 DEG (Stem) ×2 IMPLANT
SUCTION FRAZIER HANDLE 12FR (TUBING) ×1
SUCTION TUBE FRAZIER 12FR DISP (TUBING) ×1 IMPLANT
SUT FIBERWIRE #2 38 T-5 BLUE (SUTURE)
SUT MNCRL AB 3-0 PS2 18 (SUTURE) ×2 IMPLANT
SUT MON AB 2-0 CT1 36 (SUTURE) ×2 IMPLANT
SUT VIC AB 1 CT1 36 (SUTURE) ×2 IMPLANT
SUTURE FIBERWR #2 38 T-5 BLUE (SUTURE) IMPLANT
SUTURE TAPE 1.3 40 TPR END (SUTURE) ×2 IMPLANT
SUTURETAPE 1.3 40 TPR END (SUTURE) ×4
TOWEL OR 17X26 10 PK STRL BLUE (TOWEL DISPOSABLE) ×2 IMPLANT
TOWEL OR NON WOVEN STRL DISP B (DISPOSABLE) ×2 IMPLANT
WATER STERILE IRR 1000ML POUR (IV SOLUTION) ×4 IMPLANT

## 2020-07-19 NOTE — Transfer of Care (Signed)
Immediate Anesthesia Transfer of Care Note  Patient: TIASIA WEBERG  Procedure(s) Performed: REVERSE SHOULDER ARTHROPLASTY (Left Shoulder)  Patient Location: PACU  Anesthesia Type:General  Level of Consciousness: awake, alert  and oriented  Airway & Oxygen Therapy: Patient Spontanous Breathing and Patient connected to face mask oxygen  Post-op Assessment: Report given to RN and Post -op Vital signs reviewed and stable  Post vital signs: Reviewed and stable  Last Vitals:  Vitals Value Taken Time  BP    Temp    Pulse 58 07/19/20 1156  Resp 20 07/19/20 1156  SpO2 99 % 07/19/20 1156  Vitals shown include unvalidated device data.  Last Pain:  Vitals:   07/19/20 0757  TempSrc: Oral  PainSc:          Complications: No complications documented.

## 2020-07-19 NOTE — Anesthesia Postprocedure Evaluation (Signed)
Anesthesia Post Note  Patient: Martha Perkins  Procedure(s) Performed: REVERSE SHOULDER ARTHROPLASTY (Left Shoulder)     Patient location during evaluation: PACU Anesthesia Type: General and Regional Level of consciousness: sedated Pain management: pain level controlled Vital Signs Assessment: post-procedure vital signs reviewed and stable Respiratory status: spontaneous breathing and respiratory function stable Cardiovascular status: stable Postop Assessment: no apparent nausea or vomiting Anesthetic complications: no   No complications documented.  Last Vitals:  Vitals:   07/19/20 1330 07/19/20 1345  BP: 129/61 (!) 129/58  Pulse: (!) 57 (!) 59  Resp: (!) 21 17  Temp:    SpO2: 93% 96%    Last Pain:  Vitals:   07/19/20 1330  TempSrc:   PainSc: 0-No pain                 Merlinda Frederick

## 2020-07-19 NOTE — Discharge Instructions (Signed)
 Kevin M. Supple, M.D., F.A.A.O.S. Orthopaedic Surgery Specializing in Arthroscopic and Reconstructive Surgery of the Shoulder [phone] [address] - [place], [place] [address] - Fax [phone]   POST-OP TOTAL SHOULDER REPLACEMENT INSTRUCTIONS  1. Follow up in the office for your first post-op appointment 10-14 days from the date of your surgery. If you do not already have a scheduled appointment, our office will contact you to schedule.  2. The bandage over your incision is waterproof. You may begin showering with this dressing on. You may leave this dressing on until first follow up appointment within 2 weeks. We prefer you leave this dressing in place until follow up however after 5-7 days if you are having itching or skin irritation and would like to remove it you may do so. Go slow and tug at the borders gently to break the bond the dressing has with the skin. At this point if there is no drainage it is okay to go without a bandage or you may cover it with a light guaze and tape. You can also expect significant bruising around your shoulder that will drift down your arm and into your chest wall. This is very normal and should resolve over several days.   3. Wear your sling/immobilizer at all times except to perform the exercises below or to occasionally let your arm dangle by your side to stretch your elbow. You also need to sleep in your sling immobilizer until instructed otherwise. It is ok to remove your sling if you are sitting in a controlled environment and allow your arm to rest in a position of comfort by your side or on your lap with pillows to give your neck and skin a break from the sling. You may remove it to allow arm to dangle by side to shower. If you are up walking around and when you go to sleep at night you need to wear it.  4. Range of motion to your elbow, wrist, and hand are encouraged 3-5 times daily. Exercise to your hand and fingers helps to reduce  swelling you may experience.   5. Prescriptions for a pain medication and a muscle relaxant are provided for you. It is recommended that if you are experiencing pain that you pain medication alone is not controlling, add the muscle relaxant along with the pain medication which can give additional pain relief. The first 1-2 days is generally the most severe of your pain and then should gradually decrease. As your pain lessens it is recommended that you decrease your use of the pain medications to an "as needed basis'" only and to always comply with the recommended dosages of the pain medications.  6. Pain medications can produce constipation along with their use. If you experience this, the use of an over the counter stool softener or laxative daily is recommended.   7. For additional questions or concerns, please do not hesitate to call the office. If after hours there is an answering service to forward your concerns to the physician on call.  8.Pain control following an exparel block  To help control your post-operative pain you received a nerve block  performed with Exparel which is a long acting anesthetic (numbing agent) which can provide pain relief and sensations of numbness (and relief of pain) in the operative shoulder and arm for up to 3 days. Sometimes it provides mixed relief, meaning you may still have numbness in certain areas of the arm but can still be able to   move  parts of that arm, hand, and fingers. We recommend that your prescribed pain medications  be used as needed. We do not feel it is necessary to "pre medicate" and "stay ahead" of pain.  Taking narcotic pain medications when you are not having any pain can lead to unnecessary and potentially dangerous side effects.    9. Use the ice machine as much as possible in the first 5-7 days from surgery, then you can wean its use to as needed. The ice typically needs to be replaced every 6 hours, instead of ice you can actually freeze  water bottles to put in the cooler and then fill water around them to avoid having to purchase ice. You can have spare water bottles freezing to allow you to rotate them once they have melted. Try to have a thin shirt or light cloth or towel under the ice wrap to protect your skin.   FOR ADDITIONAL INFO ON ICE MACHINE AND INSTRUCTIONS GO TO THE WEBSITE AT  https://www.[employer]ogl[employer].com/products/donjoy/[email or web address]-iceman-[email or web address]  10.  We recommend that you avoid any dental work or cleaning in the first 3 months following your joint replacement. This is to help minimize the possibility of infection from the bacteria in your mouth that enters your bloodstream during dental work. We also recommend that you take an antibiotic prior to your dental work for the first year after your shoulder replacement to further help reduce that risk. Please simply contact our office for antibiotics to be sent to your pharmacy prior to dental work.  11. Dental Antibiotics:  In most cases prophylactic antibiotics for Dental procdeures after total joint surgery are not necessary.  Exceptions are as follows:  1. History of prior total joint infection  2. Severely immunocompromised (Organ Transplant, cancer chemotherapy, Rheumatoid biologic meds such as Humera)  3. Poorly controlled diabetes (A1C &gt; 8.0, blood glucose over 200)  If you have one of these conditions, contact your surgeon for an antibiotic prescription, prior to your dental procedure.   POST-OP EXERCISES  Pendulum Exercises  Perform pendulum exercises while standing and bending at the waist. Support your uninvolved arm on a table or chair and allow your operated arm to hang freely. Make sure to do these exercises passively - not using you shoulder muscles. These exercises can be performed once your nerve block effects have worn off.  Repeat 20 times. Do 3 sessions per day.     

## 2020-07-19 NOTE — Evaluation (Signed)
Occupational Therapy Evaluation Patient Details Name: Martha Perkins MRN: [id number] DOB: December 24, 1945 Today's Date: 07/19/2020    History of Present Illness Patient is a 75 year old female s/p L reverse total shoulder arthroplasty.   Clinical Impression   Patient is a 75 year old female s/p shoulder replacement without functional use of left non-dominant upper extremity secondary to effects of surgery and interscalene block and shoulder precautions. Therapist provided education and instruction to patient and spouse in regards to exercises, precautions, positioning, donning upper extremity clothing and bathing while maintaining shoulder precautions, ice and edema management and donning/doffing sling. Patient and spouse verbalized understanding and demonstrated as needed. Patient needed assistance to donn shirt, underwear, pants, socks and shoes and provided with instruction on compensatory strategies to perform ADLs. Patient to follow up with MD for further therapy needs.      Follow Up Recommendations  Follow surgeon's recommendation for DC plan and follow-up therapies    Equipment Recommendations  None recommended by OT       Precautions / Restrictions Precautions Precautions: Shoulder Type of Shoulder Precautions: If sitting in controlled environment, ok to come out of sling to give neck a break. Please sleep in it to protect until follow up in office.     OK to use operative arm for feeding, hygiene and ADLs.Ok to instruct Pendulums and lap slides as exercises. Ok to use operative arm within the following parameters for ADL purposes  New ROM Ok for PROM, AAROM, AROM within pain tolerance and within the following ROM   ER 20   ABD 45   FE 60. AROM elbow, wrist., hand ok Shoulder Interventions: Shoulder sling/immobilizer;Off for dressing/bathing/exercises Precaution Booklet Issued: Yes (comment) Required Braces or Orthoses: Sling Restrictions Weight Bearing Restrictions: Yes LUE Weight  Bearing: Non weight bearing      Mobility  Transfers Overall transfer level: Needs assistance Equipment used: None Transfers: Sit to/from Stand Sit to Stand: Supervision         General transfer comment: cues for technique    Balance Overall balance assessment: Mild deficits observed, not formally tested                                         ADL either performed or assessed with clinical judgement   ADL Overall ADL's : Needs assistance/impaired Eating/Feeding: Independent   Grooming: Wash/dry hands;Independent   Upper Body Bathing: Minimal assistance   Lower Body Bathing: Minimal assistance   Upper Body Dressing : Minimal assistance;Sitting Upper Body Dressing Details (indicate cue type and reason): patient has post op shirt that can unbotton in order to pull over head easily and then thread R UE. assisted patient with buttoning down L side of shirt Lower Body Dressing: Moderate assistance;Sitting/lateral leans;Sit to/from stand Lower Body Dressing Details (indicate cue type and reason): assist to thread LEs and pull up clothing over L hip Toilet Transfer: Supervision/safety;Ambulation;Regular Toilet   Toileting- Clothing Manipulation and Hygiene: Minimal assistance;Sit to/from stand;Sitting/lateral lean       Functional mobility during ADLs: Supervision/safety General ADL Comments: patient and spouse educated in compensatory strategies for ADL tasks in order to maintain shoulder precautions                  Pertinent Vitals/Pain Pain Assessment: Faces Faces Pain Scale: Hurts little more Pain Location: L shoulder/UE Pain Descriptors / Indicators: Heaviness;Numbness Pain Intervention(s): Monitored during session  Hand Dominance Right   Extremity/Trunk Assessment Upper Extremity Assessment Upper Extremity Assessment: LUE deficits/detail LUE Deficits / Details: + nerve block LUE: Unable to fully assess due to immobilization   Lower  Extremity Assessment Lower Extremity Assessment: Overall WFL for tasks assessed       Communication Communication Communication: No difficulties   Cognition Arousal/Alertness: Awake/alert Behavior During Therapy: WFL for tasks assessed/performed Overall Cognitive Status: Within Functional Limits for tasks assessed                                        Exercises Exercises: Shoulder   Shoulder Instructions Shoulder Instructions Donning/doffing shirt without moving shoulder: Minimal assistance;Patient able to independently direct caregiver;Caregiver independent with task Method for sponge bathing under operated UE: Minimal assistance;Caregiver independent with task;Patient able to independently direct caregiver Donning/doffing sling/immobilizer: Maximal assistance;Caregiver independent with task;Patient able to independently direct caregiver Correct positioning of sling/immobilizer: Caregiver independent with task;Patient able to independently direct caregiver Pendulum exercises (written home exercise program): Caregiver independent with task;Patient able to independently direct caregiver ROM for elbow, wrist and digits of operated UE: Caregiver independent with task;Patient able to independently direct caregiver Sling wearing schedule (on at all times/off for ADL's): Patient able to independently direct caregiver;Caregiver independent with task Proper positioning of operated UE when showering: Patient able to independently direct caregiver;Caregiver independent with task Dressing change:  (N/A) Positioning of UE while sleeping: Caregiver independent with task;Patient able to independently direct caregiver    Home Living Family/patient expects to be discharged to:: Private residence Living Arrangements: Spouse/significant other Available Help at Discharge: Family Type of Home: House Home Access: Stairs to enter Technical brewer of Steps: a few          Bathroom Shower/Tub: Occupational psychologist: Handicapped height     Home Equipment: Clinical cytogeneticist - 2 wheels;Cane - single point          Prior Functioning/Environment Level of Independence: Independent                 OT Problem List: Pain;Impaired UE functional use;Obesity;Decreased knowledge of precautions         OT Goals(Current goals can be found in the care plan section) Acute Rehab OT Goals Patient Stated Goal: get shoulders better OT Goal Formulation: All assessment and education complete, DC therapy   AM-PAC OT "6 Clicks" Daily Activity     Outcome Measure Help from another person eating meals?: None Help from another person taking care of personal grooming?: A Little Help from another person toileting, which includes using toliet, bedpan, or urinal?: A Little Help from another person bathing (including washing, rinsing, drying)?: A Little Help from another person to put on and taking off regular upper body clothing?: A Little Help from another person to put on and taking off regular lower body clothing?: A Lot 6 Click Score: 18   End of Session Equipment Utilized During Treatment: Other (comment) (sling) Nurse Communication: Other (comment) (OT complete)  Activity Tolerance: Patient tolerated treatment well Patient left: in chair;with call bell/phone within reach  OT Visit Diagnosis: Pain Pain - Right/Left: Left Pain - part of body: Shoulder                Time: 1407-1510 OT Time Calculation (min): 63 min Charges:  OT General Charges $OT Visit: 1 Visit OT Evaluation $OT Eval Low Complexity: 1 Low OT Treatments $Self  Care/Home Management : 38-52 mins  Delbert Phenix OT OT pager: [place] 07/19/2020, 3:22 PM

## 2020-07-19 NOTE — Anesthesia Procedure Notes (Signed)
Anesthesia Regional Block: Interscalene brachial plexus block   Pre-Anesthetic Checklist: ,, timeout performed, Correct Patient, Correct Site, Correct Laterality, Correct Procedure, Correct Position, site marked, Risks and benefits discussed,  Surgical consent,  Pre-op evaluation,  At surgeon's request and post-op pain management  Laterality: Left  Prep: chloraprep       Needles:  Injection technique: Single-shot  Needle Type: Echogenic Stimulator Needle     Needle Length: 5cm      Additional Needles:   Procedures:,,,, ultrasound used (permanent image in chart),,,,  Narrative:  Start time: 07/19/2020 8:15 AM End time: 07/19/2020 8:20 AM Injection made incrementally with aspirations every 5 mL.  Performed by: Personally  Anesthesiologist: Merlinda Frederick, MD  Additional Notes: A functioning IV was confirmed and monitors were applied.  Sterile prep and drape, hand hygiene and sterile gloves were used.  Negative aspiration and test dose prior to incremental administration of local anesthetic. The patient tolerated the procedure well.Ultrasound  guidance: relevant anatomy identified, needle position confirmed, local anesthetic spread visualized around nerve(s), vascular puncture avoided.  Image printed for medical record.

## 2020-07-19 NOTE — Anesthesia Preprocedure Evaluation (Addendum)
Anesthesia Evaluation  Patient identified by MRN, date of birth, ID band Patient awake    Reviewed: Allergy & Precautions, NPO status , Patient's Chart, lab work & pertinent test results  History of Anesthesia Complications (+) PONV and history of anesthetic complications  Airway Mallampati: III  TM Distance: >3 FB Neck ROM: Limited    Dental   A few missing teeth:   Pulmonary asthma ,    Pulmonary exam normal breath sounds clear to auscultation       Cardiovascular METS: 3 - Mets hypertension, Normal cardiovascular exam+ dysrhythmias (paroxysmal afib) Atrial Fibrillation  Rhythm:Regular Rate:Normal     Neuro/Psych negative neurological ROS  negative psych ROS   GI/Hepatic Neg liver ROS, GERD  ,  Endo/Other  Hypothyroidism (on synthroid)   Renal/GU Renal disease (kidney stones)  negative genitourinary   Musculoskeletal  (+) Arthritis , Osteoarthritis,    Abdominal (+) + obese,   Peds negative pediatric ROS (+)  Hematology negative hematology ROS (+)   Anesthesia Other Findings   Reproductive/Obstetrics negative OB ROS                            Anesthesia Physical Anesthesia Plan  ASA: III  Anesthesia Plan: General   Post-op Pain Management: GA combined w/ Regional for post-op pain   Induction: Intravenous  PONV Risk Score and Plan: 4 or greater and Midazolam, Treatment may vary due to age or medical condition, Ondansetron and Dexamethasone  Airway Management Planned: Oral ETT and Video Laryngoscope Planned  Additional Equipment: None  Intra-op Plan:   Post-operative Plan: Extubation in OR  Informed Consent: I have reviewed the patients History and Physical, chart, labs and discussed the procedure including the risks, benefits and alternatives for the proposed anesthesia with the patient or authorized representative who has indicated his/her understanding and acceptance.        Plan Discussed with: Anesthesiologist and CRNA  Anesthesia Plan Comments: (Interscalene block with exparel)      Anesthesia Quick Evaluation

## 2020-07-19 NOTE — Progress Notes (Signed)
AssistedDr. Elgie Congo with left, ultrasound guided, interscalene  block. Side rails up, monitors on throughout procedure. See vital signs in flow sheet. Tolerated Procedure well.

## 2020-07-19 NOTE — Op Note (Signed)
07/19/2020  11:38 AM  PATIENT:   Martha Perkins  75 y.o. 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  ASSISTANTS: Jenetta Loges, PA-C  ANESTHESIA:   General endotracheal and interscalene block with Exparel  EBL: 150 cc  SPECIMEN: None  Drains: None   PATIENT DISPOSITION:  PACU - hemodynamically stable.    PLAN OF CARE: Discharge to home after PACU  Brief history:  Patient is a 75 year old female who has had chronic and progressively increasing left shoulder pain related to severe osteoarthritis with marked bony deformity and associated rotator cuff dysfunction.  Due to her increasing functional rotations and failure to respond to prolonged attempts at conservative management she is brought to the operating room at this time for planned left shoulder reverse arthroplasty  Preoperatively, I counseled the patient regarding treatment options and risks versus benefits thereof.  Possible surgical complications were all reviewed including potential for bleeding, infection, neurovascular injury, persistent pain, loss of motion, anesthetic complication, failure of the implant, and possible need for additional surgery. They understand and accept and agrees with our planned procedure.   Procedure in detail:  After undergoing routine preop evaluation the patient received prophylactic antibiotics and interscalene block with Exparel was established in the holding area by the anesthesia department.  Patient subsequently placed supine on the operating table and underwent the smooth induction of a general endotracheal anesthesia.  Placed into the beachchair  position and appropriately padded and protected.  The left shoulder girdle region was sterilely prepped and draped in standard fashion.  Timeout was called.  A deltopectoral approach was made to the left shoulder through an approximate centimeter incision.  Skin flaps elevated dissection carried deeply with electrocautery used for hemostasis.  The deltopectoral interval was then developed from proximal to distal with the vein taken laterally in the upper centimeter the pectoralis major was tenotomized for exposure.  Conjoined tendon mobilized and retracted medially and adhesions were divided beneath the deltoid.  The long head biceps tendon was then tenodesed at the upper border the pectoralis major tendon the proximal segment was unroofed and excised.  There was no glenohumeral mobility with severe contractures of the rotator cuff.  As such we performed a tenotomy of the subscapularis but it did not show any viability so no tagging or attempt at repair was indicated.  We then divided the capsular attachments from the anterior and infra margins of the humeral head and the extremely large osteophyte had to be removed with an osteotome before we could ultimately dislocate the humeral head.  We then outlined our proposed humeral head resection with the extra medullary guide and oscillating saw was then used to resect the humeral head.  Marginal osteophytes were then removed in their entirety with a rondure.  A metal cap was then placed at the cut proximal humeral surface we then exposed the glenoid with appropriate retractors.  A circumferential labral resection was then completed gaining visualization the entirety of the circumference of the glenoid.  A guidepin was then directed into the center of the glenoid with an approximately 10 degree inferior tilt and the glenoid was then reamed with the central followed by the peripheral reamer and all debris was then carefully removed.  Preparation completed with the central  drill and tap measured at 25 for the lag screw.  Baseplate was assembled vancomycin powder applied to the threads of the lag screw and it was then inserted with excellent fit and fixation.  The peripheral locking screws were all then placed using standard technique.  A 36/+4 glenosphere was then impacted onto the baseplate with the central locking screw then placed.  Attention returned to the proximal humerus where the canal was opened and broached initially with an attempted size 6 but we were unable to achieve a reduction so we downsized to a size 5.5 which allowed [place] to seat the implant slightly deeper in the metaphysis was then reamed and ultimately able to achieve a nice reduction with good motion good stability good soft tissue balance.  The trial was then removed and a final implant was then assembled on the back table.  Vancomycin powder spread liberally into the humeral canal and our implant was then seated with excellent fit and fixation.  Trial reduction at this point showed excellent motion and stability with a +3 poly-.  The trial was then removed the final +3 polywas then impacted on the implant after it was cleaned and dried.  A final reduction again showed good motion good stability and good soft tissue balance.  At this point final irrigation was completed.  Hemostasis was obtained.  The balance of the vancomycin powder was instilled liberally throughout the deep/soft tissue planes.  The deltopectoral interval was reapproximated with a series of figure-of-eight #1 Vicryl sutures.  2-0 Monocryl used to the subcu layer and intracuticular 3-0 Monocryl for the skin followed by Dermabond and Aquacel dressing.  Right arm was then placed into a sling and the patient was awakened, extubated, and taken to the recovery room in stable condition.  Jenetta Loges, PA-C was utilized as an Environmental consultant throughout this case, essential for help with positioning the patient, positioning extremity, tissue manipulation,  implantation of the prosthesis, suture management, wound closure, and intraoperative decision-making.  Marin Shutter MD   Contact # [phone]

## 2020-07-19 NOTE — Anesthesia Procedure Notes (Signed)
Procedure Name: Intubation Date/Time: 07/19/2020 10:18 AM Performed by: Niel Hummer, CRNA Pre-anesthesia Checklist: Patient identified, Emergency Drugs available, Suction available and Patient being monitored Patient Re-evaluated:Patient Re-evaluated prior to induction Oxygen Delivery Method: Circle system utilized Preoxygenation: Pre-oxygenation with 100% oxygen Induction Type: IV induction Ventilation: Oral airway inserted - appropriate to patient size and Mask ventilation without difficulty Laryngoscope Size: Glidescope and 3 Grade View: Grade I Tube type: Oral Tube size: 7.0 mm Airway Equipment and Method: Stylet and Video-laryngoscopy Placement Confirmation: ETT inserted through vocal cords under direct vision,  positive ETCO2 and breath sounds checked- equal and bilateral Secured at: 22 cm Tube secured with: Tape Dental Injury: Teeth and Oropharynx as per pre-operative assessment  Comments: Elective glidescope used given limited neck ROM

## 2020-07-19 NOTE — H&P (Signed)
Martha Perkins    Chief Complaint: advanced left shoulder osteoarthritis HPI: The patient is a 75 y.o. female with chronic and progressively increasing bilateral shoulder pain with severely restricted mobility and increasing functional rotations related to severe osteoarthritis with associated marked bony deformity and in addition significant rotator cuff dysfunction.  Her left shoulder is more symptomatic than the right.  Due to her increasing difficulties and failure to respond to prolonged attempts at conservative management she is brought to the operating room at this time for planned left shoulder reverse arthroplasty  Past Medical History:  Diagnosis Date  . Arthritis    neck, back, hips, knees, ankles  . Asthma    use everyday  . Cancer ([place]) 2019   skin  . Complication of anesthesia   . Dyspnea   . Dysrhythmia    PVCs , Afib  . Heart murmur   . History of kidney stones   . Hyperlipidemia   . Hypertension   . Hyperthyroidism 08/08/2017   radio active iodine x2  . Hypothyroidism    now take synthroid  . Paroxysmal atrial fibrillation ([place]) 08/09/2015  . PONV (postoperative nausea and vomiting)   . Thyroid disease   . Tuberculosis     Past Surgical History:  Procedure Laterality Date  . CARDIOVERSION N/A 08/05/2016   Procedure: CARDIOVERSION;  Surgeon: Skeet Latch, MD;  Location: [facility];  Service: Cardiovascular;  Laterality: N/A;  . CATARACT EXTRACTION Left 09/2012   Dr. Bunnie Philips  . LITHOTRIPSY    . REPLACEMENT TOTAL KNEE BILATERAL  2011  . THYROIDECTOMY  1971  . TUBAL LIGATION  1979    Family History  Problem Relation Age of Onset  . Coronary artery disease Mother 50       deceased  . Depression Mother   . Hyperlipidemia Mother   . Diabetes Paternal Grandmother   . Thyroid disease Sister   . COPD Father   . Breast cancer Neg Hx     Social History:  reports that she has never smoked. She has never used smokeless tobacco. She reports that she  does not drink alcohol and does not use drugs.   Medications Prior to Admission  Medication Sig Dispense Refill  . acetaminophen (TYLENOL) 500 MG tablet Take 500 mg by mouth every 6 (six) hours as needed for moderate pain or headache.    . albuterol (VENTOLIN HFA) 108 (90 Base) MCG/ACT inhaler INHALE TWO PUFFS INTO THE LUNGS EVERY 6 HOURS AS NEEDED FOR WHEEZING OR SHORTNESS OF BREATH (Patient taking differently: Inhale 2 puffs into the lungs every 6 (six) hours as needed for shortness of breath or wheezing.) 8.5 g 2  . atorvastatin (LIPITOR) 40 MG tablet TAKE 1 TABLET BY MOUTH  DAILY (Patient taking differently: Take 40 mg by mouth daily.) 90 tablet 3  . Biotin 1000 MCG tablet Take 1,000 mcg by mouth daily.    Marland Kitchen CARTIA XT 240 MG 24 hr capsule TAKE 1 CAPSULE BY MOUTH  DAILY (Patient taking differently: Take 240 mg by mouth every evening.) 90 capsule 3  . Cholecalciferol (VITAMIN D) 50 MCG (2000 UT) tablet Take 4,000 Units by mouth daily.    . clotrimazole-betamethasone (LOTRISONE) cream Apply 1 application topically 2 (two) times daily. (Patient taking differently: Apply 1 application topically 2 (two) times daily as needed (fungus).) 45 g 2  . Coenzyme Q10 (COQ-10) 100 MG CAPS Take 100 mg by mouth daily.    . fluticasone (FLONASE) 50 MCG/ACT nasal spray USE 1  SPRAY IN BOTH  NOSTRILS TWICE DAILY (Patient taking differently: Place 1 spray into both nostrils 2 (two) times daily.) 48 g 3  . Fluticasone Furoate (ARNUITY ELLIPTA) 100 MCG/ACT AEPB Inhale 1 Inhaler into the lungs daily. 30 each 2  . furosemide (LASIX) 20 MG tablet Take 3 tablets (60 mg total) by mouth daily. 90 tablet 3  . ketoconazole (NIZORAL) 2 % cream Apply topically daily as needed for irritation. (Patient taking differently: Apply 1 application topically daily as needed for irritation.) 60 g 3  . levothyroxine (SYNTHROID) 150 MCG tablet Take 1 tablet PO QD before breakfast/take extra 1/2 tablet 1 day week/ LABS FOR REFILLS (Patient  taking differently: Take 150-225 mcg by mouth See admin instructions. Take 150 mcg daily except Saturday take 225 mcg) 96 tablet 0  . loratadine (CLARITIN) 10 MG tablet Take 10 mg by mouth at bedtime.    . Magnesium Citrate 200 MG TABS Take 200 mg by mouth 2 (two) times daily.    . meloxicam (MOBIC) 15 MG tablet TAKE 1 TABLET BY MOUTH  DAILY (Patient taking differently: Take 15 mg by mouth daily.) 90 tablet 3  . metoprolol succinate (TOPROL-XL) 50 MG 24 hr tablet TAKE 1 TABLET BY MOUTH  DAILY WITH OR IMMEDIATELY  FOLLOWING A MEAL/labs for further refills (Patient taking differently: Take 50 mg by mouth daily.) 90 tablet 0  . Multiple Vitamins-Minerals (ZINC PO) Take 1 tablet by mouth daily.    . potassium chloride (KLOR-CON) 10 MEQ tablet Take 1 tablet (10 mEq total) by mouth daily as needed. (Patient taking differently: Take 10 mEq by mouth daily.) 90 tablet 1  . traMADol (ULTRAM) 50 MG tablet TAKE ONE TABLET BY MOUTH THREE TIMES DAILY AS NEEDED 90 tablet 0  . TURMERIC PO Take 1,500 mg by mouth daily.    . vitamin B-12 (CYANOCOBALAMIN) 1000 MCG tablet Take 1,000 mcg by mouth daily.    . vitamin C (ASCORBIC ACID) 500 MG tablet Take 500 mg by mouth 2 (two) times daily.       Physical Exam: Left shoulder demonstrates profoundly restricted and painful motion as noted at the recent office visit.  She has essentially no glenohumeral motion.  She is otherwise neurovascular intact left upper extremity.  Plain radiographs confirm severe bony deformity with complete obliteration of the joint space as well as collapse of the humeral head and prominent peripheral osteophytes.  Vitals  Temp:  [98.1 F (36.7 C)] 98.1 F (36.7 C) (05/12 0757) Pulse Rate:  [55-71] 55 (05/12 0912) Resp:  [15-29] 23 (05/12 0912) BP: (129-167)/(60-77) 136/63 (05/12 0902) SpO2:  [94 %-100 %] 94 % (05/12 0912) Weight:  [95.3 kg] 95.3 kg (05/12 0754)  Assessment/Plan  Impression: advanced left shoulder  osteoarthritis  Plan of Action: Procedure(s): REVERSE SHOULDER ARTHROPLASTY  Reubin Bushnell M Eustolia Drennen 07/19/2020, 9:25 AM Contact # [phone]

## 2020-07-23 ENCOUNTER — Telehealth: Payer: Self-pay | Admitting: Family Medicine

## 2020-07-23 ENCOUNTER — Encounter (HOSPITAL_COMMUNITY): Payer: Self-pay | Admitting: Orthopedic Surgery

## 2020-07-23 NOTE — Progress Notes (Signed)
  Chronic Care Management   Outreach Note  07/23/2020 Name: Martha Perkins MRN: [id number] DOB: 01/16/1946  Referred by: Metheney, Catherine D, MD Reason for referral : No chief complaint on file.   An unsuccessful telephone outreach was attempted today. The patient was referred to the pharmacist for assistance with care management and care coordination.   Follow Up Plan:   Tresha Hayes Upstream Scheduler 

## 2020-07-23 NOTE — Chronic Care Management (AMB) (Signed)
  Chronic Care Management   Note  07/23/2020 Name: KIA VARNADORE MRN: [id number] DOB: 1945/11/13  LESSIE FUNDERBURKE is a 75 y.o. year old female who is a primary care patient of Madilyn Fireman, Rene Kocher, MD. I reached out to Vonda Antigua by phone today in response to a referral sent by Ms. Hannah Beat Brix's PCP, Hali Marry, MD.   Ms. Laube was given information about Chronic Care Management services today including:  1. CCM service includes personalized support from designated clinical staff supervised by her physician, including individualized plan of care and coordination with other care providers 2. 24/7 contact phone numbers for assistance for urgent and routine care needs. 3. Service will only be billed when office clinical staff spend 20 minutes or more in a month to coordinate care. 4. Only one practitioner may furnish and bill the service in a calendar month. 5. The patient may stop CCM services at any time (effective at the end of the month) by phone call to the office staff.   Patient agreed to services and verbal consent obtained.   Follow up plan:   Lauretta Grill Upstream Scheduler

## 2020-07-23 NOTE — Chronic Care Management (AMB) (Signed)
  Chronic Care Management   Outreach Note  07/23/2020 Name: Martha Perkins MRN: [id number] DOB: 1945/10/28  Referred by: Hali Marry, MD Reason for referral : No chief complaint on file.   An unsuccessful telephone outreach was attempted today. The patient was referred to the pharmacist for assistance with care management and care coordination.   Follow Up Plan:   Lauretta Grill Upstream Scheduler

## 2020-07-30 DIAGNOSIS — Z96612 Presence of left artificial shoulder joint: Secondary | ICD-10-CM | POA: Insufficient documentation

## 2020-08-01 DIAGNOSIS — Z96612 Presence of left artificial shoulder joint: Secondary | ICD-10-CM | POA: Diagnosis not present

## 2020-08-01 DIAGNOSIS — Z471 Aftercare following joint replacement surgery: Secondary | ICD-10-CM | POA: Diagnosis not present

## 2020-08-03 ENCOUNTER — Ambulatory Visit (INDEPENDENT_AMBULATORY_CARE_PROVIDER_SITE_OTHER): Payer: Medicare Other | Admitting: Pharmacist

## 2020-08-03 DIAGNOSIS — I10 Essential (primary) hypertension: Secondary | ICD-10-CM

## 2020-08-03 DIAGNOSIS — Z8679 Personal history of other diseases of the circulatory system: Secondary | ICD-10-CM

## 2020-08-03 DIAGNOSIS — E785 Hyperlipidemia, unspecified: Secondary | ICD-10-CM | POA: Diagnosis not present

## 2020-08-03 DIAGNOSIS — Z96612 Presence of left artificial shoulder joint: Secondary | ICD-10-CM | POA: Diagnosis not present

## 2020-08-03 NOTE — Progress Notes (Signed)
Chronic Care Management Pharmacy Note  08/03/2020 Name:  Martha Perkins MRN:  [id number] DOB:  11-17-1945  Subjective: Martha Perkins is an 75 y.o. year old female who is a primary patient of Metheney, Rene Kocher, MD.  The CCM team was consulted for assistance with disease management and care coordination needs.    Engaged with patient by telephone for initial visit in response to provider referral for pharmacy case management and/or care coordination services.   Consent to Services:  The patient was given information about Chronic Care Management services, agreed to services, and gave verbal consent prior to initiation of services.  Please see initial visit note for detailed documentation.   Patient Care Team: Hali Marry, MD as PCP - General (Family Medicine) Renato Shin, MD as Consulting Physician (Endocrinology) Darius Bump, [facility] (Pharmacist) Darius Bump, [facility] as Pharmacist (Pharmacist)  Recent office visits: 05/28/20 - Dr. Madilyn Fireman (PCP): BLE swelling, hypothyroidism, HTN f/u  Recent consult visits: 01/30/20 - Skeet Latch (cardiology): Afib, HTN, [facility] visits: 07/19/20 - Reverse Shoulder Arthroplasty SDS  Objective:  Lab Results  Component Value Date   CREATININE 0.70 07/10/2020   CREATININE 0.76 04/30/2020   CREATININE 0.76 04/23/2020    Lab Results  Component Value Date   HGBA1C 5.6 10/25/2019   Last diabetic Eye exam: No results found for: HMDIABEYEEXA  Last diabetic Foot exam: No results found for: HMDIABFOOTEX      Component Value Date/Time   CHOL 192 07/10/2020 0000   TRIG 171 (H) 07/10/2020 0000   HDL 46 (L) 07/10/2020 0000   CHOLHDL 4.2 07/10/2020 0000   VLDL 34 (H) 07/02/2015 0959   LDLCALC 117 (H) 07/10/2020 0000    Hepatic Function Latest Ref Rng & Units 07/10/2020 06/30/2019 09/20/2018  Total Protein 6.1 - 8.1 g/dL 6.8 7.1 6.9  Albumin 3.6 - 5.1 g/dL - - -  AST 10 - 35 U/L 26 24 35  ALT 6 - 29 U/L 38(H)  35(H) 53(H)  Alk Phosphatase 33 - 130 U/L - - -  Total Bilirubin 0.2 - 1.2 mg/dL 0.9 0.9 1.0  Bilirubin, Direct 0.0 - 0.2 mg/dL - - 0.1    Lab Results  Component Value Date/Time   TSH 0.46 07/10/2020 12:00 AM   TSH 4.36 04/12/2020 11:10 AM   FREET4 1.27 10/21/2019 09:47 AM   FREET4 0.96 09/11/2016 11:29 AM    CBC Latest Ref Rng & Units 07/11/2020 06/30/2019 12/08/2016  WBC 4.0 - 10.5 K/uL 5.5 6.2 5.8  Hemoglobin 12.0 - 15.0 g/dL 13.7 13.8 13.5  Hematocrit 36.0 - 46.0 % 41.2 41.0 38.7  Platelets 150 - 400 K/uL 206 187 203    No results found for: VD25OH  Clinical ASCVD: Yes  The 10-year ASCVD risk score Mikey Bussing DC Jr., et al., 2013) is: 18.2%   Values used to calculate the score:     Age: 69 years     Sex: Female     Is Non-Hispanic African American: No     Diabetic: No     Tobacco smoker: No     Systolic Blood Pressure: 325 mmHg     Is BP treated: Yes     HDL Cholesterol: 46 mg/dL     Total Cholesterol: 192 mg/dL    Other: (CHADS2VASc if Afib, PHQ9 if depression, MMRC or CAT for COPD, ACT, DEXA)  Social History   Tobacco Use  Smoking Status Never Smoker  Smokeless Tobacco Never Used   BP Readings  from Last 3 Encounters:  07/19/20 139/72  07/11/20 (!) 158/70  05/28/20 132/80   Pulse Readings from Last 3 Encounters:  07/19/20 66  07/11/20 69  05/28/20 65   Wt Readings from Last 3 Encounters:  07/19/20 210 lb (95.3 kg)  07/11/20 210 lb (95.3 kg)  05/28/20 211 lb (95.7 kg)    Assessment: Review of patient past medical history, allergies, medications, health status, including review of consultants reports, laboratory and other test data, was performed as part of comprehensive evaluation and provision of chronic care management services.   SDOH:  (Social Determinants of Health) assessments and interventions performed:    CCM Care Plan  Allergies  Allergen Reactions  . Demerol Nausea And Vomiting    Medications Reviewed Today    Reviewed by Darius Bump,  [facility] (Pharmacist) on 08/03/20 at 1540  Med List Status: <None>  Medication Order Taking? Sig Documenting Provider Last Dose Status Informant  acetaminophen (TYLENOL) 500 MG tablet [id number] Yes Take 500 mg by mouth every 6 (six) hours as needed for moderate pain or headache. [provider] Taking Active Self  albuterol (VENTOLIN HFA) 108 (90 Base) MCG/ACT inhaler [id number] Yes INHALE TWO PUFFS INTO THE LUNGS EVERY 6 HOURS AS NEEDED FOR WHEEZING OR SHORTNESS OF BREATH  Patient taking differently: Inhale 2 puffs into the lungs every 6 (six) hours as needed for shortness of breath or wheezing.   Hali Marry, MD Taking Active   atorvastatin (LIPITOR) 40 MG tablet [id number] Yes TAKE 1 TABLET BY MOUTH  DAILY  Patient taking differently: Take 40 mg by mouth daily.   Hali Marry, MD Taking Active   Biotin 1000 MCG tablet [id number] Yes Take 1,000 mcg by mouth daily. [provider] Taking Active Self  CARTIA XT 240 MG 24 hr capsule [id number] Yes TAKE 1 CAPSULE BY MOUTH  DAILY  Patient taking differently: Take 240 mg by mouth every evening.   Hali Marry, MD Taking Active   Cholecalciferol (VITAMIN D) 50 MCG (2000 UT) tablet [id number] Yes Take 4,000 Units by mouth daily. [provider] Taking Active Self  clotrimazole-betamethasone (LOTRISONE) cream [id number] Yes Apply 1 application topically 2 (two) times daily.  Patient taking differently: Apply 1 application topically 2 (two) times daily as needed (fungus).   Hali Marry, MD Taking Active   Coenzyme Q10 (COQ-10) 100 MG CAPS [id number] Yes Take 100 mg by mouth daily. [provider] Taking Active Self  cyclobenzaprine (FLEXERIL) 10 MG tablet [id number] Yes Take 1 tablet (10 mg total) by mouth 3 (three) times daily as needed for muscle spasms. Shuford, Olivia Mackie, PA-C Taking Active   fluticasone (FLONASE) 50 MCG/ACT nasal spray [id number] Yes USE 1 SPRAY IN BOTH  NOSTRILS TWICE DAILY   Patient taking differently: Place 1 spray into both nostrils 2 (two) times daily.   Hali Marry, MD Taking Active   Fluticasone Furoate (ARNUITY ELLIPTA) 100 MCG/ACT AEPB [id number] Yes Inhale 1 Inhaler into the lungs daily. Hali Marry, MD Taking Active Self           Med Note Darius Bump   Fri Aug 03, 2020  3:34 PM) Doesn't take consistently, about half of the time  furosemide (LASIX) 20 MG tablet [id number] Yes Take 3 tablets (60 mg total) by mouth daily. Terrilyn Saver, NP Taking Active Self  ketoconazole (NIZORAL) 2 % cream [id number] Yes Apply topically daily as needed for irritation.  Patient taking differently: Apply 1 application topically daily as  needed for irritation.   Hali Marry, MD Taking Active   levothyroxine (SYNTHROID) 150 MCG tablet [id number] Yes Take 1 tablet PO QD before breakfast/take extra 1/2 tablet 1 day week/ LABS FOR REFILLS  Patient taking differently: Take 150-225 mcg by mouth See admin instructions. Take 150 mcg daily except Saturday take 225 mcg   Hali Marry, MD Taking Active   loratadine (CLARITIN) 10 MG tablet [id number] Yes Take 10 mg by mouth at bedtime. [provider] Taking Active Self  Magnesium Citrate 200 MG TABS [id number] Yes Take 400 mg by mouth daily. [provider] Taking Active Self  meloxicam (MOBIC) 15 MG tablet [id number] Yes Take 1 tablet (15 mg total) by mouth daily. Shuford, Olivia Mackie, PA-C Taking Active   metoprolol succinate (TOPROL-XL) 50 MG 24 hr tablet [id number] Yes TAKE 1 TABLET BY MOUTH  DAILY WITH OR IMMEDIATELY  FOLLOWING A MEAL/labs for further refills  Patient taking differently: Take 50 mg by mouth daily.   Hali Marry, MD Taking Active   Multiple Vitamins-Minerals (ZINC PO) [id number] No Take 1 tablet by mouth daily.  Patient not taking: Reported on 08/03/2020   [provider] Not Taking Active Self  ondansetron (ZOFRAN) 4 MG tablet [id number] Yes Take 1  tablet (4 mg total) by mouth every 8 (eight) hours as needed for nausea or vomiting. Shuford, Olivia Mackie, PA-C Taking Active   potassium chloride (KLOR-CON) 10 MEQ tablet [id number] Yes Take 1 tablet (10 mEq total) by mouth daily as needed.  Patient taking differently: Take 10 mEq by mouth daily.   Hali Marry, MD Taking Active   traMADol Veatrice Bourbon) 50 MG tablet [id number] Yes Take 1 tablet (50 mg total) by mouth every 6 (six) hours as needed for moderate pain. Shuford, Otho Najjar Taking Active   TURMERIC PO [id number] Yes Take 1,500 mg by mouth in the morning and at bedtime. [provider] Taking Active Self  vitamin B-12 (CYANOCOBALAMIN) 1000 MCG tablet [id number] Yes Take 1,000 mcg by mouth daily. [provider] Taking Active Self  vitamin C (ASCORBIC ACID) 500 MG tablet [id number] Yes Take 500 mg by mouth 2 (two) times daily. [provider] Taking Active Self          Patient Active Problem List   Diagnosis Date Noted  . Aortic atherosclerosis ([place]) 12/26/2019  . Palpitations 10/21/2019  . Obesity (BMI 30-39.9) 10/21/2019  . Encounter for chronic pain management 06/30/2019  . Localized swelling of both lower extremities 09/20/2018  . Hypothyroidism 09/11/2016  . History of atrial fibrillation   . 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/14/2016  . Restrictive lung disease 09/28/2015  . Lumbar degenerative disc disease 08/15/2015  . PAF (paroxysmal atrial fibrillation) ([place]) 08/09/2015  . Chronic allergic rhinitis 05/17/2014  . Cataract 06/03/2012  . Hyperlipidemia LDL goal <100 12/21/2010  . Essential hypertension 12/21/2010  . GERD (gastroesophageal reflux disease) 07/18/2010  . Insulin resistance 07/18/2010  . Fatty liver 07/18/2010    Immunization History  Administered Date(s) Administered  . Fluad Quad(high Dose 65+) 12/29/2018, 12/26/2019  . Influenza Split 01/27/2012, 01/01/2017  . Influenza, High Dose Seasonal PF 01/29/2018  .  Influenza,inj,Quad PF,6+ Mos 11/26/2012, 12/29/2013, 12/14/2014, 11/13/2015  . Moderna Sars-Covid-2 Vaccination 06/14/2019, 07/12/2019, 01/24/2020  . PPD Test 05/18/2018  . Pneumococcal Conjugate-13 12/29/2013  . Pneumococcal Polysaccharide-23 11/26/2012  . Tdap 07/02/2015    Conditions to be addressed/monitored: Atrial Fibrillation, HTN and HLD  Care Plan : Medication Management  Updates made by Darius Bump,  [place] since 08/03/2020 12:00 AM    Problem: HTN, HLD, AFib     Long-Range Goal: Disease Progression Prevention   This Visit's Progress: On track  Priority: High  Note:   Current Barriers:  . None at present  Pharmacist Clinical Goal(s):  Marland Kitchen Over the next 180  days, patient will adhere to prescribed medication regimen as evidenced by fill history, lab values, and vitals within range through collaboration with PharmD and provider.   Interventions: . 1:1 collaboration with Hali Marry, MD regarding development and update of comprehensive plan of care as evidenced by provider attestation and co-signature . Inter-disciplinary care team collaboration (see longitudinal plan of care) . Comprehensive medication review performed; medication list updated in electronic medical record  Hypertension: . controlled; current treatment:Cartia XT 270m daily, furosemide 60 mg daily, Toprol XL 556mdaily;  . Current home readings: not taking at home, encouraged spot checking BP when out at pharmacy/grocery store or consider buying a home BP monitor  . Denies hypotensive/hypertensive symptoms . Recommended continue current regimen,   Hyperlipidemia: . Uncontrolled; current treatment:atorvastatin 4065maily;  . Educated on dietary choices to achieve cholesterol goals using lifestyle modifications  Recommended continue current regimen, consider increase to atorvastatin 30m83m lifestyle modifications do not achieve LDL <100 at next lab check.  Atrial  Fibrillation: . Uncontrolled/controlled; current rate/rhythm control: metoprolol XL & diltiazem; anticoagulant treatment: no current anticoagulant (stopped Eliquis per pt request after DC cardioversion, maintains NSR, followed by cardiology)  . CHADS2VASc score:  3, discussed this risk/benefit ratio with patient who is satisfied with current medications . Recommended continue current regimen  Patient Goals/Self-Care Activities . Over the next 180 days, patient will:  take medications as prescribed  Follow Up Plan: Telephone follow up appointment with care management team member scheduled for:  6 months      Medication Assistance: None required.  Patient affirms current coverage meets needs.  [id number]: [phone]: [address] [address] [place]: [phone]: [phone] pill box? No - has a system with her bottles which she states is working well for her Pt endorses 90% compliance  Follow Up:  Patient agrees to Care Plan and Follow-up.  Plan: Telephone follow up appointment with care management team member scheduled for:  [id number] Bump

## 2020-08-03 NOTE — Patient Instructions (Signed)
Visit Information   PATIENT GOALS:  Goals Addressed            This Visit's Progress   . Medication Management       Patient Goals/Self-Care Activities . Over the next 180 days, patient will:  take medications as prescribed  Follow Up Plan: Telephone follow up appointment with care management team member scheduled for:  6 months        Consent to CCM Services: Ms. Neace was given information about Chronic Care Management services today including:  1. CCM service includes personalized support from designated clinical staff supervised by her physician, including individualized plan of care and coordination with other care providers 2. 24/7 contact phone numbers for assistance for urgent and routine care needs. 3. Service will only be billed when office clinical staff spend 20 minutes or more in a month to coordinate care. 4. Only one practitioner may furnish and bill the service in a calendar month. 5. The patient may stop CCM services at any time (effective at the end of the month) by phone call to the office staff. 6. The patient will be responsible for cost sharing (co-pay) of up to 20% of the service fee (after annual deductible is met).  Patient agreed to services and verbal consent obtained.   Patient verbalizes understanding of instructions provided today and agrees to view in [place].   Telephone follow up appointment with care management team member scheduled for: 6 months  [place]: Patient Care Plan: Medication Management    Problem Identified: HTN, HLD, AFib     Long-Range Goal: Disease Progression Prevention   This Visit's Progress: On track  Priority: High  Note:   Current Barriers:  . None at present  Pharmacist Clinical Goal(s):  Marland Kitchen Over the next 180  days, patient will adhere to prescribed medication regimen as evidenced by fill history, lab values, and vitals within range through collaboration with PharmD and provider.    Interventions: . 1:1 collaboration with Hali Marry, MD regarding development and update of comprehensive plan of care as evidenced by provider attestation and co-signature . Inter-disciplinary care team collaboration (see longitudinal plan of care) . Comprehensive medication review performed; medication list updated in electronic medical record  Hypertension: . controlled; current treatment:Cartia XT 262m daily, furosemide 60 mg daily, Toprol XL 534mdaily;  . Current home readings: not taking at home, encouraged spot checking BP when out at pharmacy/grocery store or consider buying a home BP monitor  . Denies hypotensive/hypertensive symptoms . Recommended continue current regimen,   Hyperlipidemia: . Uncontrolled; current treatment:atorvastatin 4058maily;  . Educated on dietary choices to achieve cholesterol goals using lifestyle modifications  Recommended continue current regimen, consider increase to atorvastatin 74m41m lifestyle modifications do not achieve LDL <100 at next lab check.  Atrial Fibrillation: . Uncontrolled/controlled; current rate/rhythm control: metoprolol XL & diltiazem; anticoagulant treatment: no current anticoagulant (stopped Eliquis per pt request after DC cardioversion, maintains NSR, followed by cardiology)  . CHADS2VASc score:  3, discussed this risk/benefit ratio with patient who is satisfied with current medications . Recommended continue current regimen  Patient Goals/Self-Care Activities . Over the next 180 days, patient will:  take medications as prescribed  Follow Up Plan: Telephone follow up appointment with care management team member scheduled for:  6 months

## 2020-08-15 ENCOUNTER — Ambulatory Visit: Payer: Medicare Other | Attending: Orthopedic Surgery | Admitting: Physical Therapy

## 2020-08-15 ENCOUNTER — Encounter: Payer: Self-pay | Admitting: Physical Therapy

## 2020-08-15 ENCOUNTER — Other Ambulatory Visit: Payer: Self-pay

## 2020-08-15 DIAGNOSIS — M25512 Pain in left shoulder: Secondary | ICD-10-CM | POA: Insufficient documentation

## 2020-08-15 DIAGNOSIS — R600 Localized edema: Secondary | ICD-10-CM | POA: Diagnosis not present

## 2020-08-15 DIAGNOSIS — G8929 Other chronic pain: Secondary | ICD-10-CM | POA: Insufficient documentation

## 2020-08-15 DIAGNOSIS — M25612 Stiffness of left shoulder, not elsewhere classified: Secondary | ICD-10-CM | POA: Diagnosis not present

## 2020-08-15 NOTE — Therapy (Signed)
[facility] [place], [place], [address] Phone: [phone]   Fax:  [phone]  Physical Therapy Evaluation  Patient Details  Name: Martha Perkins MRN: [id number] Date of Birth: 12/26/1945 Referring Provider (PT): Roselee Culver PA-C   Encounter Date: 08/15/2020   PT End of Session - 08/15/20 9449    Visit Number 1    Number of Visits 8    Date for PT Re-Evaluation 09/12/20    Authorization Type FOTO AT LEAST EVERY 5TH VISIT.  PROGRESS NOTE AT 10TH VISIT.  KX MODIFIER AFTER 15 VISITS.    PT Start Time 1030    PT Stop Time 1116    PT Time Calculation (min) 46 min    Activity Tolerance Patient tolerated treatment well    Behavior During Therapy WFL for tasks assessed/performed           Past Medical History:  Diagnosis Date  . Arthritis    neck, back, hips, knees, ankles  . Asthma    use everyday  . Cancer ([place]) 2019   skin  . Complication of anesthesia   . Dyspnea   . Dysrhythmia    PVCs , Afib  . Heart murmur   . History of kidney stones   . Hyperlipidemia   . Hypertension   . Hyperthyroidism 08/08/2017   radio active iodine x2  . Hypothyroidism    now take synthroid  . Paroxysmal atrial fibrillation ([place]) 08/09/2015  . PONV (postoperative nausea and vomiting)   . Thyroid disease   . Tuberculosis     Past Surgical History:  Procedure Laterality Date  . CARDIOVERSION N/A 08/05/2016   Procedure: CARDIOVERSION;  Surgeon: Skeet Latch, MD;  Location: [facility];  Service: Cardiovascular;  Laterality: N/A;  . CATARACT EXTRACTION Left 09/2012   Dr. Bunnie Philips  . LITHOTRIPSY    . REPLACEMENT TOTAL KNEE BILATERAL  2011  . REVERSE SHOULDER ARTHROPLASTY Left 07/19/2020   Procedure: REVERSE SHOULDER ARTHROPLASTY;  Surgeon: Justice Britain, MD;  Location: [facility];  Service: Orthopedics;  Laterality: Left;  134min  . THYROIDECTOMY  1971  . TUBAL LIGATION  1979    There were no vitals filed for this  visit.    Subjective Assessment - 08/15/20 1401    Subjective COVID-19 screen performed prior to patient entering clinic.  The patient presents to the clinic today s/p left total shoulder replacement performed on 07/19/20.  She had one scare in which she pushed up on her left shoulder and it hurt.  She went back to the doctor and an X-ray taken.  She has been doing a low-level HEP which includes the pendulum exercise.  Her resting pain-level is a 4/10 today and increases with movement.  Pain medication helps a bit to decrease her pain.    Pertinent History HTN, hypothyroidism, bilateral total knee replacements.    Patient Stated Goals Use left UE without pain.    Currently in Pain? Yes    Pain Score 4     Pain Location Shoulder    Pain Orientation Left    Pain Descriptors / Indicators Aching;Sore;Throbbing    Pain Type Surgical pain    Pain Onset 1 to 4 weeks ago    Pain Frequency Constant    Aggravating Factors  See above.    Pain Relieving Factors See above.              [facility] PT Assessment - 08/15/20 0001      Assessment  Medical Diagnosis Left total shoulder replacement.    Referring Provider (PT) Roselee Culver PA-C    Onset Date/Surgical Date 07/19/20      Precautions   Precaution Comments No ultrasound.  Progress per protocol.    Required Braces or Orthoses Sling      Restrictions   Weight Bearing Restrictions --   No left UE weight bearing.     Balance Screen   Has the patient fallen in the past 6 months No    Has the patient had a decrease in activity level because of a fear of falling?  No    Is the patient reluctant to leave their home because of a fear of falling?  No      Home Environment   Living Environment Private residence      Prior Function   Level of Independence Independent      Observation/Other Assessments   Observations Min+ left shoulder edema.      Posture/Postural Control   Posture/Postural Control Postural limitations    Postural  Limitations Forward head      ROM / Strength   AROM / PROM / Strength PROM      PROM   Overall PROM Comments In supine:  Gentle left shoulder PAROM to 45 degrees of flexion and Er to -5 degrees.      Palpation   Palpation comment C/o diffuse left anterior shoulder pain.                      Objective measurements completed on examination: See above findings.       [facility] Adult PT Treatment/Exercise - 08/15/20 0001      Modalities   Modalities Vasopneumatic      Vasopneumatic   Number Minutes Vasopneumatic  20 minutes    Vasopnuematic Location  --   Left shoulder with pillow between left elbow and thorax.   Vasopneumatic Pressure Low                       PT Long Term Goals - 08/15/20 1415      PT LONG TERM GOAL #1   Title Independent with a HEP.    Time 4    Status New      PT LONG TERM GOAL #2   Title Active left shoulder flexion to 135 degrees so the patient can easily reach overhead.    Time 4    Period Weeks    Status New      PT LONG TERM GOAL #3   Title Active ER to 60 degrees+ to allow for easily donning/doffing of apparel.    Time 4    Period Weeks    Status New      PT LONG TERM GOAL #4   Title Increase left shoulder strength to a solid 4/5 to increase stability for performance of functional activities.    Time 4    Period Weeks      PT LONG TERM GOAL #5   Title Perform ADL's with pain not > 3/10.    Time 4    Period Weeks    Status New                  Plan - 08/15/20 1411    Clinical Impression Statement The patient presents to OPPT s/p left total shoulder replacement performed on 07/19/20.  She has an expected loss of range of motion and loss of functional use of  her left UE currently.  She has minimal+ edema of the left shoulder and diffuse palpable anterior shoulder pain.  She is compliant to using her sling.  Added an additional HEP which she performed with excellent technique.  Patient will benefit from  skilled physical therapy intervention to address pain and deficits.    Personal Factors and Comorbidities Other;Comorbidity 1;Comorbidity 2    Comorbidities HTN, hypothyroidism, bilateral total knee replacements.    Examination-Activity Limitations Other;Reach Overhead    Examination-Participation Restrictions Other    Stability/Clinical Decision Making Stable/Uncomplicated    Clinical Decision Making Low    Rehab Potential Excellent    PT Frequency 2x / week    PT Duration 4 weeks    PT Treatment/Interventions ADLs/Self Care Home Management;Cryotherapy;Electrical Stimulation;Moist Heat;Therapeutic activities;Therapeutic exercise;Manual techniques;Patient/family education;Passive range of motion;Vasopneumatic Device    PT Next Visit Plan Progress per protocol.  Vasopnuematic wiht pillow between left elbow and thorax.    Consulted and Agree with Plan of Care Patient           Patient will benefit from skilled therapeutic intervention in order to improve the following deficits and impairments:  Pain,Decreased activity tolerance,Decreased range of motion,Increased edema  Visit Diagnosis: Chronic left shoulder pain - Plan: PT plan of care cert/re-cert  Stiffness of left shoulder, not elsewhere classified - Plan: PT plan of care cert/re-cert  Localized edema - Plan: PT plan of care cert/re-cert     Problem List Patient Active Problem List   Diagnosis Date Noted  . Aortic atherosclerosis ([place]) 12/26/2019  . Palpitations 10/21/2019  . Obesity (BMI 30-39.9) 10/21/2019  . Encounter for chronic pain management 06/30/2019  . Localized swelling of both lower extremities 09/20/2018  . Hypothyroidism 09/11/2016  . History of atrial fibrillation   . 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/14/2016  . Restrictive lung disease 09/28/2015  . Lumbar degenerative disc disease 08/15/2015  . PAF (paroxysmal atrial fibrillation) ([place]) 08/09/2015  . Chronic allergic rhinitis 05/17/2014  .  Cataract 06/03/2012  . Hyperlipidemia LDL goal <100 12/21/2010  . Essential hypertension 12/21/2010  . GERD (gastroesophageal reflux disease) 07/18/2010  . Insulin resistance 07/18/2010  . Fatty liver 07/18/2010    Areeb Corron, [place] MPT 08/15/2020, 2:19 PM  [facility] [address] [place], [place], [address] Phone: [phone]   Fax:  [phone]  Name: BROOKLEE MICHELIN MRN: [id number] Date of Birth: 29-Jun-1945

## 2020-08-17 ENCOUNTER — Other Ambulatory Visit: Payer: Self-pay

## 2020-08-17 ENCOUNTER — Ambulatory Visit: Payer: Medicare Other | Admitting: Physical Therapy

## 2020-08-17 ENCOUNTER — Encounter: Payer: Self-pay | Admitting: Physical Therapy

## 2020-08-17 DIAGNOSIS — M25612 Stiffness of left shoulder, not elsewhere classified: Secondary | ICD-10-CM

## 2020-08-17 DIAGNOSIS — G8929 Other chronic pain: Secondary | ICD-10-CM

## 2020-08-17 DIAGNOSIS — R600 Localized edema: Secondary | ICD-10-CM

## 2020-08-17 DIAGNOSIS — M25512 Pain in left shoulder: Secondary | ICD-10-CM

## 2020-08-17 NOTE — Therapy (Signed)
[facility] [place], [place], [address] Phone: [phone]   Fax:  [phone]  Physical Therapy Treatment  Patient Details  Name: Martha Perkins MRN: [id number] Date of Birth: 1945/08/12 Referring Provider (PT): Roselee Culver PA-C   Encounter Date: 08/17/2020   PT End of Session - 08/17/20 1036     Visit Number 2    Number of Visits 8    Date for PT Re-Evaluation 09/12/20    Authorization Type FOTO AT LEAST EVERY 5TH VISIT.  PROGRESS NOTE AT 10TH VISIT.  KX MODIFIER AFTER 15 VISITS.    PT Start Time 1036    PT Stop Time 1113    PT Time Calculation (min) 37 min    Activity Tolerance Patient tolerated treatment well    Behavior During Therapy WFL for tasks assessed/performed             Past Medical History:  Diagnosis Date   Arthritis    neck, back, hips, knees, ankles   Asthma    use everyday   Cancer ([place]) 1219   skin   Complication of anesthesia    Dyspnea    Dysrhythmia    PVCs , Afib   Heart murmur    History of kidney stones    Hyperlipidemia    Hypertension    Hyperthyroidism 08/08/2017   radio active iodine x2   Hypothyroidism    now take synthroid   Paroxysmal atrial fibrillation ([place]) 08/09/2015   PONV (postoperative nausea and vomiting)    Thyroid disease    Tuberculosis     Past Surgical History:  Procedure Laterality Date   CARDIOVERSION N/A 08/05/2016   Procedure: CARDIOVERSION;  Surgeon: Skeet Latch, MD;  Location: [facility];  Service: Cardiovascular;  Laterality: N/A;   CATARACT EXTRACTION Left 09/2012   Dr. Bunnie Philips   LITHOTRIPSY     REPLACEMENT TOTAL KNEE BILATERAL  2011   REVERSE SHOULDER ARTHROPLASTY Left 07/19/2020   Procedure: REVERSE SHOULDER ARTHROPLASTY;  Surgeon: Justice Britain, MD;  [facility];  Service: Orthopedics;  Laterality: Left;  149min   THYROIDECTOMY  1971   TUBAL LIGATION  1979    There were no vitals filed for this visit.   Subjective  Assessment - 08/17/20 1033     Subjective COVID-19 screen performed prior to patient entering clinic. Reported being fatigued after showering with help from her husband.    Pertinent History HTN, hypothyroidism, bilateral total knee replacements.    Patient Stated Goals Use left UE without pain.    Currently in Pain? No/denies                [facility] PT Assessment - 08/17/20 0001       Assessment   Medical Diagnosis Left total shoulder replacement.    Referring Provider (PT) Roselee Culver PA-C    Onset Date/Surgical Date 07/19/20    Hand Dominance Right      Precautions   Precaution Comments No ultrasound.  Progress per protocol.    Required Braces or Orthoses Sling                           [facility] Adult PT Treatment/Exercise - 08/17/20 0001       Modalities   Modalities Vasopneumatic      Vasopneumatic   Number Minutes Vasopneumatic  10 minutes    Vasopnuematic Location  Shoulder    Vasopneumatic Pressure Low    Vasopneumatic Temperature  34      Manual Therapy   Manual Therapy Passive ROM    Passive ROM PROM of L shoulder into flex/ IR/ ER with gentle holds at end range                         PT Long Term Goals - 08/15/20 1415       PT LONG TERM GOAL #1   Title Independent with a HEP.    Time 4    Status New      PT LONG TERM GOAL #2   Title Active left shoulder flexion to 135 degrees so the patient can easily reach overhead.    Time 4    Period Weeks    Status New      PT LONG TERM GOAL #3   Title Active ER to 60 degrees+ to allow for easily donning/doffing of apparel.    Time 4    Period Weeks    Status New      PT LONG TERM GOAL #4   Title Increase left shoulder strength to a solid 4/5 to increase stability for performance of functional activities.    Time 4    Period Weeks      PT LONG TERM GOAL #5   Title Perform ADL's with pain not > 3/10.    Time 4    Period Weeks    Status New                    Plan - 08/17/20 1110     Clinical Impression Statement Patient presented in clinic with reports of no L shoulder pain although fatigued after showering this morning. Patient's husband is able to help her shower and dress at this time. Patient had L shoulder sling donned upon arrival. No end feels currently at this time secondary to protocol and deficient ROM as patient is just beginning PT. Smooth arc of motion noted during PROM of L shoulder in all directions assessed. Normal vasopnematic response noted following removal of the modality.    Personal Factors and Comorbidities Other;Comorbidity 1;Comorbidity 2    Comorbidities HTN, hypothyroidism, bilateral total knee replacements.    Examination-Activity Limitations Other;Reach Overhead    Examination-Participation Restrictions Other    Stability/Clinical Decision Making Stable/Uncomplicated    Rehab Potential Excellent    PT Frequency 2x / week    PT Duration 4 weeks    PT Treatment/Interventions ADLs/Self Care Home Management;Cryotherapy;Electrical Stimulation;Moist Heat;Therapeutic activities;Therapeutic exercise;Manual techniques;Patient/family education;Passive range of motion;Vasopneumatic Device    PT Next Visit Plan Progress per protocol.  Vasopnuematic wiht pillow between left elbow and thorax.    Consulted and Agree with Plan of Care Patient             Patient will benefit from skilled therapeutic intervention in order to improve the following deficits and impairments:  Pain, Decreased activity tolerance, Decreased range of motion, Increased edema  Visit Diagnosis: Chronic left shoulder pain  Stiffness of left shoulder, not elsewhere classified  Localized edema     Problem List Patient Active Problem List   Diagnosis Date Noted   Aortic atherosclerosis ([place]) 12/26/2019   Palpitations 10/21/2019   Obesity (BMI 30-39.9) 10/21/2019   Encounter for chronic pain management 06/30/2019   Localized swelling  of both lower extremities 09/20/2018   Hypothyroidism 09/11/2016   History of atrial fibrillation    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/14/2016   Restrictive lung disease 09/28/2015  Lumbar degenerative disc disease 08/15/2015   PAF (paroxysmal atrial fibrillation) ([place]) 08/09/2015   Chronic allergic rhinitis 05/17/2014   Cataract 06/03/2012   Hyperlipidemia LDL goal <100 12/21/2010   Essential hypertension 12/21/2010   GERD (gastroesophageal reflux disease) 07/18/2010   Insulin resistance 07/18/2010   Fatty liver 07/18/2010    Standley Brooking, PTA 08/17/2020, 11:40 AM  [facility] [place], [place], [address] Phone: [phone]   Fax:  [phone]  Name: Martha Perkins MRN: [id number] Date of Birth: [birth year]

## 2020-08-20 ENCOUNTER — Other Ambulatory Visit: Payer: Self-pay

## 2020-08-20 ENCOUNTER — Ambulatory Visit: Payer: Medicare Other | Admitting: Physical Therapy

## 2020-08-20 DIAGNOSIS — M25612 Stiffness of left shoulder, not elsewhere classified: Secondary | ICD-10-CM | POA: Diagnosis not present

## 2020-08-20 DIAGNOSIS — G8929 Other chronic pain: Secondary | ICD-10-CM | POA: Diagnosis not present

## 2020-08-20 DIAGNOSIS — M25512 Pain in left shoulder: Secondary | ICD-10-CM | POA: Diagnosis not present

## 2020-08-20 DIAGNOSIS — R600 Localized edema: Secondary | ICD-10-CM | POA: Diagnosis not present

## 2020-08-20 NOTE — Therapy (Signed)
[facility] [place], [place], [address] Phone: [phone]   Fax:  [phone]  Physical Therapy Treatment  Patient Details  Name: Martha Perkins MRN: [id number] Date of Birth: 03-17-1945 Referring Provider (PT): Roselee Culver PA-C   Encounter Date: 08/20/2020   PT End of Session - 08/20/20 1113     Visit Number 3    Number of Visits 8    Date for PT Re-Evaluation 09/12/20    Authorization Type FOTO AT LEAST EVERY 5TH VISIT.  PROGRESS NOTE AT 10TH VISIT.  KX MODIFIER AFTER 15 VISITS.    PT Start Time 1030    PT Stop Time 1116    PT Time Calculation (min) 46 min    Activity Tolerance Patient tolerated treatment well    Behavior During Therapy WFL for tasks assessed/performed             Past Medical History:  Diagnosis Date   Arthritis    neck, back, hips, knees, ankles   Asthma    use everyday   Cancer ([place]) 4403   skin   Complication of anesthesia    Dyspnea    Dysrhythmia    PVCs , Afib   Heart murmur    History of kidney stones    Hyperlipidemia    Hypertension    Hyperthyroidism 08/08/2017   radio active iodine x2   Hypothyroidism    now take synthroid   Paroxysmal atrial fibrillation ([place]) 08/09/2015   PONV (postoperative nausea and vomiting)    Thyroid disease    Tuberculosis     Past Surgical History:  Procedure Laterality Date   CARDIOVERSION N/A 08/05/2016   Procedure: CARDIOVERSION;  Surgeon: Skeet Latch, MD;  Location: [facility];  Service: Cardiovascular;  Laterality: N/A;   CATARACT EXTRACTION Left 09/2012   Dr. Bunnie Philips   LITHOTRIPSY     REPLACEMENT TOTAL KNEE BILATERAL  2011   REVERSE SHOULDER ARTHROPLASTY Left 07/19/2020   Procedure: REVERSE SHOULDER ARTHROPLASTY;  Surgeon: Justice Britain, MD;  [facility];  Service: Orthopedics;  Laterality: Left;  170min   THYROIDECTOMY  1971   TUBAL LIGATION  1979    There were no vitals filed for this visit.   Subjective  Assessment - 08/20/20 1102     Subjective COVID-19 screen performed prior to patient entering clinic.  No new complaints.    Pertinent History HTN, hypothyroidism, bilateral total knee replacements.    Patient Stated Goals Use left UE without pain.    Currently in Pain? Yes    Pain Score 4     Pain Location Shoulder    Pain Orientation Left    Pain Descriptors / Indicators Aching;Sore;Throbbing    Pain Type Surgical pain    Pain Onset 1 to 4 weeks ago                               [facility] Adult PT Treatment/Exercise - 08/20/20 0001       Vasopneumatic   Number Minutes Vasopneumatic  15 minutes    Vasopnuematic Location  --   Left shoulder...pillow between left elbow and thorax.   Vasopneumatic Pressure Low      Manual Therapy   Manual Therapy Passive ROM    Passive ROM Gentle PROM x 23 minutes in supine to patient's left shoulder with gentle low load long duration stretching technique utilized.  PT Long Term Goals - 08/15/20 1415       PT LONG TERM GOAL #1   Title Independent with a HEP.    Time 4    Status New      PT LONG TERM GOAL #2   Title Active left shoulder flexion to 135 degrees so the patient can easily reach overhead.    Time 4    Period Weeks    Status New      PT LONG TERM GOAL #3   Title Active ER to 60 degrees+ to allow for easily donning/doffing of apparel.    Time 4    Period Weeks    Status New      PT LONG TERM GOAL #4   Title Increase left shoulder strength to a solid 4/5 to increase stability for performance of functional activities.    Time 4    Period Weeks      PT LONG TERM GOAL #5   Title Perform ADL's with pain not > 3/10.    Time 4    Period Weeks    Status New                   Plan - 08/20/20 1110     Clinical Impression Statement Patient doing well with passive range of motion to her left shoulder per protocol.    Personal Factors and Comorbidities  Other;Comorbidity 1;Comorbidity 2    Comorbidities HTN, hypothyroidism, bilateral total knee replacements.    Examination-Activity Limitations Other;Reach Overhead    Stability/Clinical Decision Making Stable/Uncomplicated    Rehab Potential Excellent    PT Frequency 2x / week    PT Duration 4 weeks    PT Treatment/Interventions ADLs/Self Care Home Management;Cryotherapy;Electrical Stimulation;Moist Heat;Therapeutic activities;Therapeutic exercise;Manual techniques;Patient/family education;Passive range of motion;Vasopneumatic Device    PT Next Visit Plan Progress per protocol.  Vasopnuematic wiht pillow between left elbow and thorax.    Consulted and Agree with Plan of Care Patient             Patient will benefit from skilled therapeutic intervention in order to improve the following deficits and impairments:  Pain, Decreased activity tolerance, Decreased range of motion, Increased edema  Visit Diagnosis: Chronic left shoulder pain  Stiffness of left shoulder, not elsewhere classified  Localized edema     Problem List Patient Active Problem List   Diagnosis Date Noted   Aortic atherosclerosis ([place]) 12/26/2019   Palpitations 10/21/2019   Obesity (BMI 30-39.9) 10/21/2019   Encounter for chronic pain management 06/30/2019   Localized swelling of both lower extremities 09/20/2018   Hypothyroidism 09/11/2016   History of atrial fibrillation    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/14/2016   Restrictive lung disease 09/28/2015   Lumbar degenerative disc disease 08/15/2015   PAF (paroxysmal atrial fibrillation) ([facility]) 08/09/2015   Chronic allergic rhinitis 05/17/2014   Cataract 06/03/2012   Hyperlipidemia LDL goal <100 12/21/2010   Essential hypertension 12/21/2010   GERD (gastroesophageal reflux disease) 07/18/2010   Insulin resistance 07/18/2010   Fatty liver 07/18/2010    Martha Perkins, Martha Perkins MPT 08/20/2020, 12:21 PM  [facility] [address] [place], [place], [address] Phone: [phone]   Fax:  [phone]  Name: Martha Perkins MRN: [id number] Date of Birth: [birth year]

## 2020-08-23 ENCOUNTER — Encounter: Payer: Self-pay | Admitting: Physical Therapy

## 2020-08-23 ENCOUNTER — Ambulatory Visit: Payer: Medicare Other | Admitting: Physical Therapy

## 2020-08-23 ENCOUNTER — Other Ambulatory Visit: Payer: Self-pay

## 2020-08-23 DIAGNOSIS — M25612 Stiffness of left shoulder, not elsewhere classified: Secondary | ICD-10-CM

## 2020-08-23 DIAGNOSIS — G8929 Other chronic pain: Secondary | ICD-10-CM | POA: Diagnosis not present

## 2020-08-23 DIAGNOSIS — R600 Localized edema: Secondary | ICD-10-CM | POA: Diagnosis not present

## 2020-08-23 DIAGNOSIS — M25512 Pain in left shoulder: Secondary | ICD-10-CM | POA: Diagnosis not present

## 2020-08-23 NOTE — Therapy (Signed)
[facility] [place], [place], [address] Phone: [phone]   Fax:  [phone]  Physical Therapy Treatment  Patient Details  Name: Martha Perkins MRN: [id number] Date of Birth: 03-01-1946 Referring Provider (PT): Roselee Culver PA-C   Encounter Date: 08/23/2020   PT End of Session - 08/23/20 1032     Visit Number 4    Number of Visits 8    Date for PT Re-Evaluation 09/12/20    Authorization Type FOTO AT LEAST EVERY 5TH VISIT.  PROGRESS NOTE AT 10TH VISIT.  KX MODIFIER AFTER 15 VISITS.    PT Start Time 1033    PT Stop Time 1116    PT Time Calculation (min) 43 min    Activity Tolerance Patient tolerated treatment well    Behavior During Therapy WFL for tasks assessed/performed             Past Medical History:  Diagnosis Date   Arthritis    neck, back, hips, knees, ankles   Asthma    use everyday   Cancer ([place]) 9924   skin   Complication of anesthesia    Dyspnea    Dysrhythmia    PVCs , Afib   Heart murmur    History of kidney stones    Hyperlipidemia    Hypertension    Hyperthyroidism 08/08/2017   radio active iodine x2   Hypothyroidism    now take synthroid   Paroxysmal atrial fibrillation ([place]) 08/09/2015   PONV (postoperative nausea and vomiting)    Thyroid disease    Tuberculosis     Past Surgical History:  Procedure Laterality Date   CARDIOVERSION N/A 08/05/2016   Procedure: CARDIOVERSION;  Surgeon: Skeet Latch, MD;  Location: [facility];  Service: Cardiovascular;  Laterality: N/A;   CATARACT EXTRACTION Left 09/2012   Dr. Bunnie Philips   LITHOTRIPSY     REPLACEMENT TOTAL KNEE BILATERAL  2011   REVERSE SHOULDER ARTHROPLASTY Left 07/19/2020   Procedure: REVERSE SHOULDER ARTHROPLASTY;  Surgeon: Justice Britain, MD;  Location: [facility];  Service: Orthopedics;  Laterality: Left;  175min   THYROIDECTOMY  1971   TUBAL LIGATION  1979    There were no vitals filed for this visit.   Subjective  Assessment - 08/23/20 1032     Subjective COVID-19 screen performed prior to patient entering clinic.  No new complaints.    Pertinent History HTN, hypothyroidism, bilateral total knee replacements.    Patient Stated Goals Use left UE without pain.    Currently in Pain? Yes    Pain Score 1     Pain Location Shoulder    Pain Orientation Left    Pain Descriptors / Indicators Discomfort    Pain Type Surgical pain    Pain Onset 1 to 4 weeks ago    Pain Frequency Intermittent                [facility] PT Assessment - 08/23/20 0001       Assessment   Medical Diagnosis Left total shoulder replacement.    Referring Provider (PT) Roselee Culver PA-C    Onset Date/Surgical Date 07/19/20    Hand Dominance Right    Next MD Visit 09/05/2020      Precautions   Precaution Comments No ultrasound.  Progress per protocol.    Required Braces or Orthoses Sling  [facility] Adult PT Treatment/Exercise - 08/23/20 0001       Modalities   Modalities Vasopneumatic      Vasopneumatic   Number Minutes Vasopneumatic  15 minutes    Vasopnuematic Location  Shoulder    Vasopneumatic Pressure Low    Vasopneumatic Temperature  34      Manual Therapy   Manual Therapy Passive ROM    Passive ROM PROM of L shoulder into flexion, ER, IR with gentle holds at end range                         PT Long Term Goals - 08/15/20 1415       PT LONG TERM GOAL #1   Title Independent with a HEP.    Time 4    Status New      PT LONG TERM GOAL #2   Title Active left shoulder flexion to 135 degrees so the patient can easily reach overhead.    Time 4    Period Weeks    Status New      PT LONG TERM GOAL #3   Title Active ER to 60 degrees+ to allow for easily donning/doffing of apparel.    Time 4    Period Weeks    Status New      PT LONG TERM GOAL #4   Title Increase left shoulder strength to a solid 4/5 to increase stability for performance of  functional activities.    Time 4    Period Weeks      PT LONG TERM GOAL #5   Title Perform ADL's with pain not > 3/10.    Time 4    Period Weeks    Status New                   Plan - 08/23/20 1133     Clinical Impression Statement Patient continues to tolerate PROM sessions well without guarding and with minimal pain reported since surgery. Patient does report some aggravation after showering or dressing with assistance. Firm end feels and smooth arc of motion noted during PROM of L shoulder. Normal vasopneumatic response noted following removal of the modality.    Personal Factors and Comorbidities Other;Comorbidity 1;Comorbidity 2    Comorbidities HTN, hypothyroidism, bilateral total knee replacements.    Examination-Activity Limitations Other;Reach Overhead    Examination-Participation Restrictions Other    Stability/Clinical Decision Making Stable/Uncomplicated    Rehab Potential Excellent    PT Frequency 2x / week    PT Duration 4 weeks    PT Treatment/Interventions ADLs/Self Care Home Management;Cryotherapy;Electrical Stimulation;Moist Heat;Therapeutic activities;Therapeutic exercise;Manual techniques;Patient/family education;Passive range of motion;Vasopneumatic Device    PT Next Visit Plan Progress per protocol.  Vasopnuematic wiht pillow between left elbow and thorax.    Consulted and Agree with Plan of Care Patient             Patient will benefit from skilled therapeutic intervention in order to improve the following deficits and impairments:  Pain, Decreased activity tolerance, Decreased range of motion, Increased edema  Visit Diagnosis: Chronic left shoulder pain  Stiffness of left shoulder, not elsewhere classified  Localized edema     Problem List Patient Active Problem List   Diagnosis Date Noted   Aortic atherosclerosis ([place]) 12/26/2019   Palpitations 10/21/2019   Obesity (BMI 30-39.9) 10/21/2019   Encounter for chronic pain management  06/30/2019   Localized swelling of both lower extremities 09/20/2018   Hypothyroidism 09/11/2016  History of atrial fibrillation    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/14/2016   Restrictive lung disease 09/28/2015   Lumbar degenerative disc disease 08/15/2015   PAF (paroxysmal atrial fibrillation) ([place]) 08/09/2015   Chronic allergic rhinitis 05/17/2014   Cataract 06/03/2012   Hyperlipidemia LDL goal <100 12/21/2010   Essential hypertension 12/21/2010   GERD (gastroesophageal reflux disease) 07/18/2010   Insulin resistance 07/18/2010   Fatty liver 07/18/2010    Standley Brooking, PTA 08/23/2020, 12:06 PM  [facility] [address] [place], [place], [address] Phone: [phone]   Fax:  [phone]  Name: INDIYAH PAONE MRN: [id number] Date of Birth: 04/21/1945

## 2020-08-27 ENCOUNTER — Ambulatory Visit: Payer: Medicare Other | Admitting: Physical Therapy

## 2020-08-27 ENCOUNTER — Other Ambulatory Visit: Payer: Self-pay

## 2020-08-27 DIAGNOSIS — M25512 Pain in left shoulder: Secondary | ICD-10-CM | POA: Diagnosis not present

## 2020-08-27 DIAGNOSIS — R600 Localized edema: Secondary | ICD-10-CM | POA: Diagnosis not present

## 2020-08-27 DIAGNOSIS — G8929 Other chronic pain: Secondary | ICD-10-CM | POA: Diagnosis not present

## 2020-08-27 DIAGNOSIS — M25612 Stiffness of left shoulder, not elsewhere classified: Secondary | ICD-10-CM

## 2020-08-27 NOTE — Therapy (Signed)
[facility] [place], [place], [address] Phone: [phone]   Fax:  [phone]  Physical Therapy Treatment  Patient Details  Name: Martha Perkins MRN: [id number] Date of Birth: [birth year] Referring Provider (PT): Roselee Culver PA-C   Encounter Date: 08/27/2020   PT End of Session - 08/27/20 1107     Visit Number 5    Number of Visits 8    Date for PT Re-Evaluation 09/12/20    Authorization Type FOTO AT LEAST EVERY 5TH VISIT.  PROGRESS NOTE AT 10TH VISIT.  KX MODIFIER AFTER 15 VISITS.    PT Start Time 1027    PT Stop Time 1114    PT Time Calculation (min) 47 min    Activity Tolerance Patient tolerated treatment well    Behavior During Therapy WFL for tasks assessed/performed             Past Medical History:  Diagnosis Date   Arthritis    neck, back, hips, knees, ankles   Asthma    use everyday   Cancer ([place]) 2536   skin   Complication of anesthesia    Dyspnea    Dysrhythmia    PVCs , Afib   Heart murmur    History of kidney stones    Hyperlipidemia    Hypertension    Hyperthyroidism 08/08/2017   radio active iodine x2   Hypothyroidism    now take synthroid   Paroxysmal atrial fibrillation ([place]) 08/09/2015   PONV (postoperative nausea and vomiting)    Thyroid disease    Tuberculosis     Past Surgical History:  Procedure Laterality Date   CARDIOVERSION N/A 08/05/2016   Procedure: CARDIOVERSION;  Surgeon: Skeet Latch, MD;  Location: [facility];  Service: Cardiovascular;  Laterality: N/A;   CATARACT EXTRACTION Left 09/2012   Dr. Bunnie Philips   LITHOTRIPSY     REPLACEMENT TOTAL KNEE BILATERAL  2011   REVERSE SHOULDER ARTHROPLASTY Left 07/19/2020   Procedure: REVERSE SHOULDER ARTHROPLASTY;  Surgeon: Justice Britain, MD;  Location[facility];  Service: Orthopedics;  Laterality: Left;  17min   THYROIDECTOMY  1971   TUBAL LIGATION  1979    There were no vitals filed for this visit.   Subjective  Assessment - 08/27/20 1106     Subjective COVID-19 screen performed prior to patient entering clinic.  No new complaints.    Pertinent History HTN, hypothyroidism, bilateral total knee replacements.    Patient Stated Goals Use left UE without pain.    Currently in Pain? Yes    Pain Score 1     Pain Location Shoulder    Pain Orientation Left    Pain Descriptors / Indicators Discomfort;Dull    Pain Type Surgical pain    Pain Onset 1 to 4 weeks ago                               [facility] Adult PT Treatment/Exercise - 08/27/20 0001       Vasopneumatic   Number Minutes Vasopneumatic  15 minutes    Vasopnuematic Location  --   Lt shld with pillow between left elbow and thorax.   Vasopneumatic Pressure Low      Manual Therapy   Manual Therapy Passive ROM    Passive ROM PROM x 27 minutes to patient's left shoulder, performed in supine, with low laod long duration strecthing technique utilized.  PT Long Term Goals - 08/15/20 1415       PT LONG TERM GOAL #1   Title Independent with a HEP.    Time 4    Status New      PT LONG TERM GOAL #2   Title Active left shoulder flexion to 135 degrees so the patient can easily reach overhead.    Time 4    Period Weeks    Status New      PT LONG TERM GOAL #3   Title Active ER to 60 degrees+ to allow for easily donning/doffing of apparel.    Time 4    Period Weeks    Status New      PT LONG TERM GOAL #4   Title Increase left shoulder strength to a solid 4/5 to increase stability for performance of functional activities.    Time 4    Period Weeks      PT LONG TERM GOAL #5   Title Perform ADL's with pain not > 3/10.    Time 4    Period Weeks    Status New                   Plan - 08/27/20 1110     Clinical Impression Statement Patient progressing well per protocol.  Her pain is remaining low.    Personal Factors and Comorbidities Other;Comorbidity 1;Comorbidity 2     Comorbidities HTN, hypothyroidism, bilateral total knee replacements.    Examination-Activity Limitations Other;Reach Overhead    Examination-Participation Restrictions Other    Stability/Clinical Decision Making Stable/Uncomplicated    Rehab Potential Excellent    PT Frequency 2x / week    PT Duration 4 weeks    PT Treatment/Interventions ADLs/Self Care Home Management;Cryotherapy;Electrical Stimulation;Moist Heat;Therapeutic activities;Therapeutic exercise;Manual techniques;Patient/family education;Passive range of motion;Vasopneumatic Device    PT Next Visit Plan Progress per protocol.  Vasopnuematic with pillow between left elbow and thorax.    Consulted and Agree with Plan of Care Patient             Patient will benefit from skilled therapeutic intervention in order to improve the following deficits and impairments:  Pain, Decreased activity tolerance, Decreased range of motion, Increased edema  Visit Diagnosis: Chronic left shoulder pain  Stiffness of left shoulder, not elsewhere classified  Localized edema     Problem List Patient Active Problem List   Diagnosis Date Noted   Aortic atherosclerosis ([place]) 12/26/2019   Palpitations 10/21/2019   Obesity (BMI 30-39.9) 10/21/2019   Encounter for chronic pain management 06/30/2019   Localized swelling of both lower extremities 09/20/2018   Hypothyroidism 09/11/2016   History of atrial fibrillation    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/14/2016   Restrictive lung disease 09/28/2015   Lumbar degenerative disc disease 08/15/2015   PAF (paroxysmal atrial fibrillation) ([facility]) 08/09/2015   Chronic allergic rhinitis 05/17/2014   Cataract 06/03/2012   Hyperlipidemia LDL goal <100 12/21/2010   Essential hypertension 12/21/2010   GERD (gastroesophageal reflux disease) 07/18/2010   Insulin resistance 07/18/2010   Fatty liver 07/18/2010    Baltasar Twilley, [place] MPT 08/27/2020, 11:31 AM  [facility] [address] [place], [place], [address] Phone: [phone]   Fax:  [phone]  Name: AJA WHITEHAIR MRN: [id number] Date of Birth: May 13, 1945

## 2020-08-29 ENCOUNTER — Encounter: Payer: Medicare Other | Admitting: Physical Therapy

## 2020-08-29 ENCOUNTER — Other Ambulatory Visit: Payer: Self-pay

## 2020-08-29 DIAGNOSIS — M51369 Other intervertebral disc degeneration, lumbar region without mention of lumbar back pain or lower extremity pain: Secondary | ICD-10-CM

## 2020-08-29 DIAGNOSIS — M5136 Other intervertebral disc degeneration, lumbar region: Secondary | ICD-10-CM

## 2020-08-29 DIAGNOSIS — G8929 Other chronic pain: Secondary | ICD-10-CM

## 2020-08-29 MED ORDER — TRAMADOL HCL 50 MG PO TABS: 50.0000 mg | ORAL_TABLET | Freq: Four times a day (QID) | ORAL | 0 refills | Status: DC | PRN

## 2020-08-30 ENCOUNTER — Other Ambulatory Visit: Payer: Self-pay

## 2020-08-30 ENCOUNTER — Encounter: Payer: Self-pay | Admitting: Cardiovascular Disease

## 2020-08-30 ENCOUNTER — Ambulatory Visit: Payer: Medicare Other | Admitting: Cardiovascular Disease

## 2020-08-30 VITALS — BP 140/68 | HR 74 | Ht 62.0 in | Wt 209.8 lb

## 2020-08-30 DIAGNOSIS — I480 Paroxysmal atrial fibrillation: Secondary | ICD-10-CM

## 2020-08-30 DIAGNOSIS — Z5181 Encounter for therapeutic drug level monitoring: Secondary | ICD-10-CM | POA: Diagnosis not present

## 2020-08-30 DIAGNOSIS — E785 Hyperlipidemia, unspecified: Secondary | ICD-10-CM

## 2020-08-30 DIAGNOSIS — I10 Essential (primary) hypertension: Secondary | ICD-10-CM | POA: Diagnosis not present

## 2020-08-30 DIAGNOSIS — E669 Obesity, unspecified: Secondary | ICD-10-CM

## 2020-08-30 DIAGNOSIS — M7989 Other specified soft tissue disorders: Secondary | ICD-10-CM | POA: Diagnosis not present

## 2020-08-30 MED ORDER — VALSARTAN 160 MG PO TABS: 160.0000 mg | ORAL_TABLET | Freq: Every day | ORAL | 3 refills | Status: DC

## 2020-08-30 NOTE — Assessment & Plan Note (Signed)
IVC was not dilated on her echo 12/2018 and she only has grade 1 diastolic dysfunction.  I think this is more due to venous insufficiency.  Diltiazem may also be contributing.  We will stop it and switch to valsartan.  Continue taking furosemide.

## 2020-08-30 NOTE — Assessment & Plan Note (Signed)
Blood pressure is above goal in the office today.  She is unsure what it has been running at home lately.  We will stop diltiazem as it may be contributing to her lower extremity edema.  She will continue the metoprolol which she is on for history of atrial fibrillation.  Start valsartan 160 mg daily.  Check a basic metabolic panel in a week.  She will track her blood pressures and bring to follow-up.

## 2020-08-30 NOTE — Patient Instructions (Addendum)
Medication Instructions:  STOP DILTIAZEM   START VALSARTAN 160 MG DAILY   *If you need a refill on your cardiac medications before your next appointment, please call your pharmacy*  Lab Work: BMET IN 1 WEEK   Testing/Procedures: NONE   Follow-Up: At [facility], you and your health needs are our priority.  As part of our continuing mission to provide you with exceptional heart care, we have created designated Provider Care Teams.  These Care Teams include your primary Cardiologist (physician) and Advanced Practice Providers (APPs -  Physician Assistants and Nurse Practitioners) who all work together to provide you with the care you need, when you need it.  We recommend signing up for the patient portal called "MyChart".  Sign up information is provided on this After Visit Summary.  MyChart is used to connect with patients for Virtual Visits (Telemedicine).  Patients are able to view lab/test results, encounter notes, upcoming appointments, etc.  Non-urgent messages can be sent to your provider as well.   To learn more about what you can do with MyChart, go to [email or web address].    Your next appointment:   12 month(s)  The format for your next appointment:   In Person  Provider:   [facility]   Your physician recommends that you schedule a follow-up appointment in: [address] [place] [address]

## 2020-08-30 NOTE — Assessment & Plan Note (Signed)
Continue working on diet and exercise.  Continue atorvastatin.

## 2020-08-30 NOTE — Assessment & Plan Note (Signed)
She continues to maintain sinus rhythm.  Her atrial fibrillation occurred in the setting of Graves' disease and hyperthyroidism.  She has maintained sinus rhythm since her thyroid has been under control.  There is no recurrent atrial fibrillation on ambulatory monitor and she has not been on anticoagulation.  Continue metoprolol.  We will stop the diltiazem as it may be contributing to her lower extremity edema.

## 2020-08-30 NOTE — Assessment & Plan Note (Signed)
Her exercise has been limited since her shoulder surgery.  Encouraged her to start back doing some walking and to get 150 minutes of exercise weekly.

## 2020-08-30 NOTE — Progress Notes (Signed)
Cardiology Office Note   Date:  08/30/2020   ID:  Martha Perkins Jul 15, 1945, MRN [id number]  PCP:  Hali Marry, MD  Cardiologist:   Skeet Latch, MD   No chief complaint on file.    History of Present Illness: Martha Perkins is a 75 y.o. female with paroxysmal atrial fibrillation, Grave's disease, hypertension, white coat hypertension, hyperlipidemia, and fatty liver disease who presents follow up on atrial fibrillation.  Martha Perkins was seen by her PCP, [name], on 07/13/15 for newly-diagnosed atrial fibrillation.  She was started on Eliquis and a low-dose beta blocker. Lab work revealed that she was hyperthyroid.  She had a thyroid uptake scan that revealed an enlarged, hyperfunctioning left lobe of the thyroid gland without discrete nodule. Findings were thought to be consistent with Graves' disease. She also underwent radioactive iodine therapy. She was seen in clinic 08/09/15 and metoprolol was increased.  Lisinopril was discontinued. She had an echo 08/2015 that revealed LVEF 50-55% with moderate left atrial enlargement, mild MR and TR.  Martha Perkins underwent cardioversion on 08/05/16.    She was started on Flovent inhaler because she was having to use her albuterol more frequently.  She thinks that this helped her breathing.  She saw [name] 10/2019 and noted some palpitations.  She wore a monitor that showed short runs of SVT but no atrial fibrillation, so anticoagulation was discontinued. Today, she reports having reverse left shoulder replacement 07/19/2020. Currently she is in physical therapy. Otherwise, she is feeling okay. She continues to have some bilateral LE edema, and she takes Lasix 60 mg daily for this. Sometimes she forgets to elevate her feet and after a while they feel very tight and puffy. She does wear compression socks for times that she knows she will be on her feet. They do help improve her edema but she has difficulty getting them on at  times. Her shortness of breath is stable and unchanged. Occasionally she experiences orthopnea, and she will get up and use her inhaler which helps give relief. At home she does not monitor her blood pressure. Lately, she has not been doing much formal exercising. She remains compliant with metoprolol. She denies any chest pain, palpitations, or exertional symptoms. No headaches, lightheadedness, or syncope to report. Also has no PND.   Past Medical History:  Diagnosis Date   Arthritis    neck, back, hips, knees, ankles   Asthma    use everyday   Cancer ([place]) 1660   skin   Complication of anesthesia    Dyspnea    Dysrhythmia    PVCs , Afib   Heart murmur    History of kidney stones    Hyperlipidemia    Hypertension    Hyperthyroidism 08/08/2017   radio active iodine x2   Hypothyroidism    now take synthroid   Paroxysmal atrial fibrillation ([place]) 08/09/2015   PONV (postoperative nausea and vomiting)    Thyroid disease    Tuberculosis     Past Surgical History:  Procedure Laterality Date   CARDIOVERSION N/A 08/05/2016   Procedure: CARDIOVERSION;  Surgeon: Skeet Latch, MD;  Location: [facility];  Service: Cardiovascular;  Laterality: N/A;   CATARACT EXTRACTION Left 09/2012   Dr. Bunnie Philips   LITHOTRIPSY     REPLACEMENT TOTAL KNEE BILATERAL  2011   REVERSE SHOULDER ARTHROPLASTY Left 07/19/2020   Procedure: REVERSE SHOULDER ARTHROPLASTY;  Surgeon: Justice Britain, MD;  Location: [facility];  Service: Orthopedics;  Laterality: Left;  138min   THYROIDECTOMY  1971   TUBAL LIGATION  1979     Current Outpatient Medications  Medication Sig Dispense Refill   acetaminophen (TYLENOL) 500 MG tablet Take 500 mg by mouth every 6 (six) hours as needed for moderate pain or headache.     albuterol (VENTOLIN HFA) 108 (90 Base) MCG/ACT inhaler INHALE TWO PUFFS INTO THE LUNGS EVERY 6 HOURS AS NEEDED FOR WHEEZING OR SHORTNESS OF BREATH (Patient taking differently: Inhale 2 puffs into the lungs  every 6 (six) hours as needed for shortness of breath or wheezing.) 8.5 g 2   atorvastatin (LIPITOR) 40 MG tablet TAKE 1 TABLET BY MOUTH  DAILY (Patient taking differently: Take 40 mg by mouth daily.) 90 tablet 3   Biotin 1000 MCG tablet Take 1,000 mcg by mouth daily.     Cholecalciferol (VITAMIN D) 50 MCG (2000 UT) tablet Take 4,000 Units by mouth daily.     clotrimazole-betamethasone (LOTRISONE) cream Apply 1 application topically 2 (two) times daily. (Patient taking differently: Apply 1 application topically 2 (two) times daily as needed (fungus).) 45 g 2   Coenzyme Q10 (COQ-10) 100 MG CAPS Take 100 mg by mouth daily.     cyclobenzaprine (FLEXERIL) 10 MG tablet Take 1 tablet (10 mg total) by mouth 3 (three) times daily as needed for muscle spasms. 30 tablet 1   fluticasone (FLONASE) 50 MCG/ACT nasal spray USE 1 SPRAY IN BOTH  NOSTRILS TWICE DAILY (Patient taking differently: Place 1 spray into both nostrils 2 (two) times daily.) 48 g 3   Fluticasone Furoate (ARNUITY ELLIPTA) 100 MCG/ACT AEPB Inhale 1 Inhaler into the lungs daily. 30 each 2   furosemide (LASIX) 20 MG tablet Take 3 tablets (60 mg total) by mouth daily. 90 tablet 3   ketoconazole (NIZORAL) 2 % cream Apply topically daily as needed for irritation. (Patient taking differently: Apply 1 application topically daily as needed for irritation.) 60 g 3   levothyroxine (SYNTHROID) 150 MCG tablet Take 1 tablet PO QD before breakfast/take extra 1/2 tablet 1 day week/ LABS FOR REFILLS (Patient taking differently: Take 150-225 mcg by mouth See admin instructions. Take 150 mcg daily except Saturday take 225 mcg) 96 tablet 0   loratadine (CLARITIN) 10 MG tablet Take 10 mg by mouth at bedtime.     Magnesium Citrate 200 MG TABS Take 400 mg by mouth daily.     meloxicam (MOBIC) 15 MG tablet Take 1 tablet (15 mg total) by mouth daily. 30 tablet 1   metoprolol succinate (TOPROL-XL) 50 MG 24 hr tablet TAKE 1 TABLET BY MOUTH  DAILY WITH OR IMMEDIATELY   FOLLOWING A MEAL/labs for further refills (Patient taking differently: Take 50 mg by mouth daily.) 90 tablet 0   Multiple Vitamins-Minerals (ZINC PO) Take 1 tablet by mouth daily.     potassium chloride (KLOR-CON) 10 MEQ tablet Take 1 tablet (10 mEq total) by mouth daily as needed. (Patient taking differently: Take 10 mEq by mouth daily.) 90 tablet 1   traMADol (ULTRAM) 50 MG tablet Take 1 tablet (50 mg total) by mouth every 6 (six) hours as needed for moderate pain. 30 tablet 0   TURMERIC PO Take 1,500 mg by mouth in the morning and at bedtime.     valsartan (DIOVAN) 160 MG tablet Take 1 tablet (160 mg total) by mouth daily. 90 tablet 3   vitamin B-12 (CYANOCOBALAMIN) 1000 MCG tablet Take 1,000 mcg by mouth daily.     vitamin C (ASCORBIC  ACID) 500 MG tablet Take 500 mg by mouth 2 (two) times daily.     Zinc 50 MG CAPS Take 50 mg by mouth daily.     No current facility-administered medications for this visit.    Allergies:   Demerol    Social History:  The patient  reports that she has never smoked. She has never used smokeless tobacco. She reports that she does not drink alcohol and does not use drugs.   Family History:  The patient's family history includes COPD in her father; Coronary artery disease (age of onset: 11) in her mother; Depression in her mother; Diabetes in her paternal grandmother; Hyperlipidemia in her mother; Thyroid disease in her sister.    ROS:   Please see the history of present illness. (+) Bilateral LE edema (+) Shortness of breath (+) Orthopnea All other systems are reviewed and negative.    PHYSICAL EXAM: VS:  BP 140/68 (BP Location: Right Arm, Patient Position: Sitting)   Pulse 74   Ht 5\' 2"  (1.575 m)   Wt 209 lb 12.8 oz (95.2 kg)   BMI 38.37 kg/m  , BMI Body mass index is 38.37 kg/m. GENERAL:  Well appearing.  No acute distress HEENT:  Pupils equal round and reactive, fundi not visualized, oral mucosa unremarkable NECK:  No jugular venous  distention, waveform within normal limits, carotid upstroke brisk and symmetric, no bruits LUNGS:  Clear to auscultation bilaterally HEART:  RRR.  PMI not displaced or sustained,S1 and S2 within normal limits, no S3, no S4, no clicks, no rubs, I/VI systolic murmur at LUSB ABD:  Flat, positive bowel sounds normal in frequency in pitch, no bruits, no rebound, no guarding, no midline pulsatile mass, no hepatomegaly, no splenomegaly EXT:  2 plus pulses throughout, 2+ tense LE edema bilaterally, no cyanosis no clubbing SKIN:  Erythematous stasis dermatitis on the anterior tibia bilaterally NEURO:  Cranial nerves II through XII grossly intact, motor grossly intact throughout PSYCH:  Cognitively intact, oriented to person place and time  EKG:   12/11/15: Atrial fibrillation. Rate 64 bpm. PVC. Prior septal infarct.  Right axis deviation. 07/18/16: Atrial fibrillation.  Rate 80 bpm.    09/23/16:  Sinus bradycardia. Rate 58 bpm. Septal infarct. 12/21/18: Sinus rhythm.  Rate 65 bpm.  Poor R wave progression. 06/03/2019: Sinus rhythm.  Rate 65 bpm.  First-degree AV block. 08/30/2020: Sinus rhythm. Rate 74 bpm. Cannot rule out prior anterior infarct.  Monitor 11/18/2019: 13 Day Event Monitor   Quality: Fair.  Baseline artifact. Predominant rhythm: sinus rhythm Average heart rate: 64 bpm Max heart rate: 144 bpm Min heart rate: 49 bpm   Occasional PACs Occasional PVCs, ventricular couplets and ventricular bigeminy noted   Short runs of SVT up to 144 bpm noted  Echo 12/30/2018:  1. Left ventricular ejection fraction, by visual estimation, is 60 to  65%. The left ventricle has normal function. Normal left ventricular size.  There is no left ventricular hypertrophy.   2. Left ventricular diastolic Doppler parameters are consistent with  impaired relaxation pattern of LV diastolic filling.   3. Global right ventricle has normal systolic function.The right  ventricular size is normal. No increase in  right ventricular wall  thickness.   4. Left atrial size was normal.   5. Right atrial size was normal.   6. Mild to moderate mitral annular calcification.   7. The mitral valve is normal in structure. No evidence of mitral valve  regurgitation. No evidence of mitral stenosis.  8. The tricuspid valve is normal in structure. Tricuspid valve  regurgitation was not visualized by color flow Doppler.   9. The aortic valve is tricuspid Aortic valve regurgitation was not  visualized by color flow Doppler. Mild aortic valve sclerosis without  stenosis.  10. There is Mild calcification of the aortic valve.  11. There is Mild thickening of the aortic valve.  12. The pulmonic valve was normal in structure. Pulmonic valve  regurgitation is not visualized by color flow Doppler.  13. The inferior vena cava is normal in size with greater than 50%  respiratory variability, suggesting right atrial pressure of 3 mmHg.   Recent Labs: 04/23/2020: Brain Natriuretic Peptide 25 07/10/2020: ALT 38; BUN 14; Creat 0.70; Potassium 4.0; Sodium 142; TSH 0.46 07/11/2020: Hemoglobin 13.7; Platelets 206    Lipid Panel    Component Value Date/Time   CHOL 192 07/10/2020 0000   TRIG 171 (H) 07/10/2020 0000   HDL 46 (L) 07/10/2020 0000   CHOLHDL 4.2 07/10/2020 0000   VLDL 34 (H) 07/02/2015 0959   LDLCALC 117 (H) 07/10/2020 0000    Wt Readings from Last 3 Encounters:  08/30/20 209 lb 12.8 oz (95.2 kg)  07/19/20 210 lb (95.3 kg)  07/11/20 210 lb (95.3 kg)      ASSESSMENT AND PLAN: PAF (paroxysmal atrial fibrillation) ([place]) She continues to maintain sinus rhythm.  Her atrial fibrillation occurred in the setting of Graves' disease and hyperthyroidism.  She has maintained sinus rhythm since her thyroid has been under control.  There is no recurrent atrial fibrillation on ambulatory monitor and she has not been on anticoagulation.  Continue metoprolol.  We will stop the diltiazem as it may be contributing to her  lower extremity edema.  Hyperlipidemia LDL goal <100 Continue working on diet and exercise.  Continue atorvastatin.  Essential hypertension Blood pressure is above goal in the office today.  She is unsure what it has been running at home lately.  We will stop diltiazem as it may be contributing to her lower extremity edema.  She will continue the metoprolol which she is on for history of atrial fibrillation.  Start valsartan 160 mg daily.  Check a basic metabolic panel in a week.  She will track her blood pressures and bring to follow-up.  Obesity (BMI 30-39.9) Her exercise has been limited since her shoulder surgery.  Encouraged her to start back doing some walking and to get 150 minutes of exercise weekly.  Localized swelling of both lower extremities IVC was not dilated on her echo 12/2018 and she only has grade 1 diastolic dysfunction.  I think this is more due to venous insufficiency.  Diltiazem may also be contributing.  We will stop it and switch to valsartan.  Continue taking furosemide.    Current medicines are reviewed at length with the patient today.  The patient does not have concerns regarding medicines.  The following changes have been made: none  Labs/ tests ordered today include:   Orders Placed This Encounter  Procedures   Basic metabolic panel   AMB Referral to [facility] Pharm[facility]   EKG 12-Lead      Disposition:   FU with Zayed Griffie C. Oval Linsey, MD, [facility] in 1 year.   I,Mathew Stumpf,acting as a Education administrator for Skeet Latch, MD.,have documented all relevant documentation on the behalf of Skeet Latch, MD,as directed by  Skeet Latch, MD while in the presence of Skeet Latch, MD.  I, Dunlap Oval Linsey, MD have reviewed all documentation for this  visit.  The documentation of the exam, diagnosis, procedures, and orders on 08/30/2020 are all accurate and complete.   Signed, Onedia Vargus C. Oval Linsey, MD, [facility]  08/30/2020 9:40 AM    [facility]

## 2020-08-31 ENCOUNTER — Encounter: Payer: Self-pay | Admitting: Physical Therapy

## 2020-08-31 ENCOUNTER — Other Ambulatory Visit: Payer: Self-pay

## 2020-08-31 ENCOUNTER — Ambulatory Visit: Payer: Medicare Other | Admitting: Physical Therapy

## 2020-08-31 DIAGNOSIS — M25612 Stiffness of left shoulder, not elsewhere classified: Secondary | ICD-10-CM

## 2020-08-31 DIAGNOSIS — R600 Localized edema: Secondary | ICD-10-CM | POA: Diagnosis not present

## 2020-08-31 DIAGNOSIS — M25512 Pain in left shoulder: Secondary | ICD-10-CM

## 2020-08-31 DIAGNOSIS — G8929 Other chronic pain: Secondary | ICD-10-CM

## 2020-08-31 NOTE — Therapy (Signed)
[facility] [place], [place], [address] Phone: [phone]   Fax:  [phone]  Physical Therapy Treatment  Patient Details  Name: Martha Perkins MRN: [id number] Date of Birth: 12/03/1945 Referring Provider (PT): Roselee Culver PA-C   Encounter Date: 08/31/2020   PT End of Session - 08/31/20 0948     Visit Number 6    Number of Visits 8    Date for PT Re-Evaluation 09/12/20    Authorization Type FOTO AT LEAST EVERY 5TH VISIT.  PROGRESS NOTE AT 10TH VISIT.  KX MODIFIER AFTER 15 VISITS.    PT Start Time [phone]    PT Stop Time 1033    PT Time Calculation (min) 45 min    Activity Tolerance Patient tolerated treatment well    Behavior During Therapy [facility] for tasks assessed/performed             Past Medical History:  Diagnosis Date   Arthritis    neck, back, hips, knees, ankles   Asthma    use everyday   Cancer ([place]) 0539   skin   Complication of anesthesia    Dyspnea    Dysrhythmia    PVCs , Afib   Heart murmur    History of kidney stones    Hyperlipidemia    Hypertension    Hyperthyroidism 08/08/2017   radio active iodine x2   Hypothyroidism    now take synthroid   Paroxysmal atrial fibrillation ([place]) 08/09/2015   PONV (postoperative nausea and vomiting)    Thyroid disease    Tuberculosis     Past Surgical History:  Procedure Laterality Date   CARDIOVERSION N/A 08/05/2016   Procedure: CARDIOVERSION;  Surgeon: Skeet Latch, MD;  Location: [facility];  Service: Cardiovascular;  Laterality: N/A;   CATARACT EXTRACTION Left 09/2012   Dr. Bunnie Philips   LITHOTRIPSY     REPLACEMENT TOTAL KNEE BILATERAL  2011   REVERSE SHOULDER ARTHROPLASTY Left 07/19/2020   Procedure: REVERSE SHOULDER ARTHROPLASTY;  Surgeon: Justice Britain, MD;  [facility];  Service: Orthopedics;  Laterality: Left;  182min   THYROIDECTOMY  1971   TUBAL LIGATION  1979    There were no vitals filed for this visit.   Subjective  Assessment - 08/31/20 0947     Subjective COVID-19 screen performed prior to patient entering clinic.  No new complaints. Generally no energy.    Pertinent History HTN, hypothyroidism, bilateral total knee replacements.    Patient Stated Goals Use left UE without pain.    Currently in Pain? No/denies                [facility] PT Assessment - 08/31/20 0001       Assessment   Medical Diagnosis Left total shoulder replacement.    Referring Provider (PT) Roselee Culver PA-C    Onset Date/Surgical Date 07/19/20    Hand Dominance Right    Next MD Visit 09/05/2020      Precautions   Precaution Comments No ultrasound.  Progress per protocol.    Required Braces or Orthoses Sling                           [facility] Adult PT Treatment/Exercise - 08/31/20 0001       Modalities   Modalities Vasopneumatic      Vasopneumatic   Number Minutes Vasopneumatic  15 minutes    Vasopnuematic Location  Shoulder    Vasopneumatic Pressure Low  Vasopneumatic Temperature  34      Manual Therapy   Manual Therapy Passive ROM    Passive ROM PROM of L shoulder into flex, ER with gentle holds at end range                         PT Long Term Goals - 08/15/20 1415       PT LONG TERM GOAL #1   Title Independent with a HEP.    Time 4    Status New      PT LONG TERM GOAL #2   Title Active left shoulder flexion to 135 degrees so the patient can easily reach overhead.    Time 4    Period Weeks    Status New      PT LONG TERM GOAL #3   Title Active ER to 60 degrees+ to allow for easily donning/doffing of apparel.    Time 4    Period Weeks    Status New      PT LONG TERM GOAL #4   Title Increase left shoulder strength to a solid 4/5 to increase stability for performance of functional activities.    Time 4    Period Weeks      PT LONG TERM GOAL #5   Title Perform ADL's with pain not > 3/10.    Time 4    Period Weeks    Status New                    Plan - 08/31/20 1109     Clinical Impression Statement Patient presented in clinic with reports of no L shoulder pain. Patient compliant with sling use as well. Firm end feels and smooth arc of motion noted during PROM of L shoulder. No complaints of any increased pain or discomfort during PROM. Normal vasopneumatic response noted following removal of the modality.    Personal Factors and Comorbidities Other;Comorbidity 1;Comorbidity 2    Comorbidities HTN, hypothyroidism, bilateral total knee replacements.    Examination-Activity Limitations Other;Reach Overhead    Examination-Participation Restrictions Other    Stability/Clinical Decision Making Stable/Uncomplicated    Rehab Potential Excellent    PT Frequency 2x / week    PT Duration 4 weeks    PT Treatment/Interventions ADLs/Self Care Home Management;Cryotherapy;Electrical Stimulation;Moist Heat;Therapeutic activities;Therapeutic exercise;Manual techniques;Patient/family education;Passive range of motion;Vasopneumatic Device    PT Next Visit Plan Progress per protocol.  Vasopnuematic with pillow between left elbow and thorax.    Consulted and Agree with Plan of Care Patient             Patient will benefit from skilled therapeutic intervention in order to improve the following deficits and impairments:  Pain, Decreased activity tolerance, Decreased range of motion, Increased edema  Visit Diagnosis: Chronic left shoulder pain  Stiffness of left shoulder, not elsewhere classified  Localized edema     Problem List Patient Active Problem List   Diagnosis Date Noted   Aortic atherosclerosis ([place]) 12/26/2019   Palpitations 10/21/2019   Obesity (BMI 30-39.9) 10/21/2019   Encounter for chronic pain management 06/30/2019   Localized swelling of both lower extremities 09/20/2018   Hypothyroidism 09/11/2016   History of atrial fibrillation    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/14/2016   Restrictive lung  disease 09/28/2015   Lumbar degenerative disc disease 08/15/2015   PAF (paroxysmal atrial fibrillation) ([facility]) 08/09/2015   Chronic allergic rhinitis 05/17/2014   Cataract 06/03/2012   Hyperlipidemia  LDL goal <100 12/21/2010   Essential hypertension 12/21/2010   GERD (gastroesophageal reflux disease) 07/18/2010   Insulin resistance 07/18/2010   Fatty liver 07/18/2010    Standley Brooking, PTA 08/31/2020, 11:11 AM  [facility] [place], [place], [address] Phone: [phone]   Fax:  [phone]  Name: Martha Perkins MRN: [id number] Date of Birth: March 15, 1945

## 2020-09-02 ENCOUNTER — Other Ambulatory Visit: Payer: Self-pay | Admitting: Family Medicine

## 2020-09-02 DIAGNOSIS — I10 Essential (primary) hypertension: Secondary | ICD-10-CM

## 2020-09-02 DIAGNOSIS — E890 Postprocedural hypothyroidism: Secondary | ICD-10-CM

## 2020-09-03 ENCOUNTER — Ambulatory Visit: Payer: Medicare Other | Admitting: Physical Therapy

## 2020-09-03 ENCOUNTER — Ambulatory Visit: Payer: Medicare Other | Admitting: Family Medicine

## 2020-09-03 ENCOUNTER — Encounter: Payer: Self-pay | Admitting: Physical Therapy

## 2020-09-03 ENCOUNTER — Other Ambulatory Visit: Payer: Self-pay

## 2020-09-03 DIAGNOSIS — R600 Localized edema: Secondary | ICD-10-CM

## 2020-09-03 DIAGNOSIS — M25612 Stiffness of left shoulder, not elsewhere classified: Secondary | ICD-10-CM | POA: Diagnosis not present

## 2020-09-03 DIAGNOSIS — G8929 Other chronic pain: Secondary | ICD-10-CM

## 2020-09-03 DIAGNOSIS — M25512 Pain in left shoulder: Secondary | ICD-10-CM | POA: Diagnosis not present

## 2020-09-03 NOTE — Therapy (Signed)
[facility] [place], [place], [address] Phone: [phone]   Fax:  [phone]  Physical Therapy Treatment  Patient Details  Name: Martha Perkins MRN: [id number] Date of Birth: [birth year] Referring Provider (PT): Roselee Culver PA-C   Encounter Date: 09/03/2020   PT End of Session - 09/03/20 6962     Visit Number 7    Number of Visits 8    Date for PT Re-Evaluation 09/12/20    Authorization Type FOTO AT LEAST EVERY 5TH VISIT.  PROGRESS NOTE AT 10TH VISIT.  KX MODIFIER AFTER 15 VISITS.    PT Start Time 1033    PT Stop Time 1111    PT Time Calculation (min) 38 min    Activity Tolerance Patient tolerated treatment well    Behavior During Therapy WFL for tasks assessed/performed             Past Medical History:  Diagnosis Date   Arthritis    neck, back, hips, knees, ankles   Asthma    use everyday   Cancer ([place]) 9528   skin   Complication of anesthesia    Dyspnea    Dysrhythmia    PVCs , Afib   Heart murmur    History of kidney stones    Hyperlipidemia    Hypertension    Hyperthyroidism 08/08/2017   radio active iodine x2   Hypothyroidism    now take synthroid   Paroxysmal atrial fibrillation ([place]) 08/09/2015   PONV (postoperative nausea and vomiting)    Thyroid disease    Tuberculosis     Past Surgical History:  Procedure Laterality Date   CARDIOVERSION N/A 08/05/2016   Procedure: CARDIOVERSION;  Surgeon: Skeet Latch, MD;  Location: [facility];  Service: Cardiovascular;  Laterality: N/A;   CATARACT EXTRACTION Left 09/2012   Dr. Bunnie Philips   LITHOTRIPSY     REPLACEMENT TOTAL KNEE BILATERAL  2011   REVERSE SHOULDER ARTHROPLASTY Left 07/19/2020   Procedure: REVERSE SHOULDER ARTHROPLASTY;  Surgeon: Justice Britain, MD;  Location: [facility];  Service: Orthopedics;  Laterality: Left;  166min   THYROIDECTOMY  1971   TUBAL LIGATION  1979    There were no vitals filed for this visit.   Subjective  Assessment - 09/03/20 1032     Subjective COVID-19 screen performed prior to patient entering clinic.  More arthritis pain today. Had more pain yesterday per patient report for unknown reason.    Pertinent History HTN, hypothyroidism, bilateral total knee replacements.    Patient Stated Goals Use left UE without pain.    Currently in Pain? No/denies                [facility] PT Assessment - 09/03/20 0001       Assessment   Medical Diagnosis Left total shoulder replacement.    Referring Provider (PT) Roselee Culver PA-C    Onset Date/Surgical Date 07/19/20    Hand Dominance Right    Next MD Visit 09/05/2020      Precautions   Precaution Comments No ultrasound.  Progress per protocol.    Required Braces or Orthoses Sling      ROM / Strength   AROM / PROM / Strength PROM      PROM   Overall PROM  Deficits    PROM Assessment Site Shoulder    Right/Left Shoulder Left    Left Shoulder Flexion 117 Degrees    Left Shoulder External Rotation 30 Degrees  [facility] Adult PT Treatment/Exercise - 09/03/20 0001       Modalities   Modalities Vasopneumatic      Vasopneumatic   Number Minutes Vasopneumatic  10 minutes    Vasopnuematic Location  Shoulder    Vasopneumatic Pressure Low    Vasopneumatic Temperature  34      Manual Therapy   Manual Therapy Passive ROM    Passive ROM PROM of L shoulder into flex, ER with gentle holds at end range                         PT Long Term Goals - 09/03/20 1115       PT LONG TERM GOAL #1   Title Independent with a HEP.    Time 4    Status On-going      PT LONG TERM GOAL #2   Title Active left shoulder flexion to 135 degrees so the patient can easily reach overhead.    Time 4    Period Weeks    Status On-going      PT LONG TERM GOAL #3   Title Active ER to 60 degrees+ to allow for easily donning/doffing of apparel.    Time 4    Period Weeks    Status On-going      PT LONG  TERM GOAL #4   Title Increase left shoulder strength to a solid 4/5 to increase stability for performance of functional activities.    Time 4    Period Weeks    Status On-going      PT LONG TERM GOAL #5   Title Perform ADL's with pain not > 3/10.    Time 4    Period Weeks    Status On-going                   Plan - 09/03/20 1102     Clinical Impression Statement Patient presented in clinic with no current L shoulder pain. Patient did experience greater L shoulder pain yesterday for unknown reason. Firm end feels and smooth arc of motion noted during PROM of L shoulder. Patient compliant with sling use as well. Patient advised to not start any lifting of her arm. Normal vasopneumatic response noted following removal of the modality.    Personal Factors and Comorbidities Other;Comorbidity 1;Comorbidity 2    Comorbidities HTN, hypothyroidism, bilateral total knee replacements.    Examination-Activity Limitations Other;Reach Overhead    Examination-Participation Restrictions Other    Stability/Clinical Decision Making Stable/Uncomplicated    Rehab Potential Excellent    PT Frequency 2x / week    PT Duration 4 weeks    PT Treatment/Interventions ADLs/Self Care Home Management;Cryotherapy;Electrical Stimulation;Moist Heat;Therapeutic activities;Therapeutic exercise;Manual techniques;Patient/family education;Passive range of motion;Vasopneumatic Device    PT Next Visit Plan Progress per protocol.  Vasopnuematic with pillow between left elbow and thorax.    Consulted and Agree with Plan of Care Patient             Patient will benefit from skilled therapeutic intervention in order to improve the following deficits and impairments:  Pain, Decreased activity tolerance, Decreased range of motion, Increased edema  Visit Diagnosis: Chronic left shoulder pain  Stiffness of left shoulder, not elsewhere classified  Localized edema     Problem List Patient Active Problem List    Diagnosis Date Noted   Aortic atherosclerosis ([place]) 12/26/2019   Palpitations 10/21/2019   Obesity (BMI 30-39.9) 10/21/2019   Encounter for chronic pain management  06/30/2019   Localized swelling of both lower extremities 09/20/2018   Hypothyroidism 09/11/2016   History of atrial fibrillation    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/14/2016   Restrictive lung disease 09/28/2015   Lumbar degenerative disc disease 08/15/2015   PAF (paroxysmal atrial fibrillation) ([place]) 08/09/2015   Chronic allergic rhinitis 05/17/2014   Cataract 06/03/2012   Hyperlipidemia LDL goal <100 12/21/2010   Essential hypertension 12/21/2010   GERD (gastroesophageal reflux disease) 07/18/2010   Insulin resistance 07/18/2010   Fatty liver 07/18/2010    Standley Brooking, PTA 09/03/2020, 11:17 AM  [facility] [address] [place], [place], [address] Phone: [phone]   Fax:  [phone]  Name: Martha Perkins MRN: [id number] Date of Birth: [birth year]

## 2020-09-04 ENCOUNTER — Ambulatory Visit: Payer: Medicare Other | Admitting: Physician Assistant

## 2020-09-05 ENCOUNTER — Other Ambulatory Visit: Payer: Self-pay | Admitting: *Deleted

## 2020-09-05 DIAGNOSIS — I10 Essential (primary) hypertension: Secondary | ICD-10-CM

## 2020-09-05 DIAGNOSIS — Z5181 Encounter for therapeutic drug level monitoring: Secondary | ICD-10-CM

## 2020-09-06 ENCOUNTER — Ambulatory Visit: Payer: Medicare Other | Admitting: Physical Therapy

## 2020-09-06 ENCOUNTER — Other Ambulatory Visit: Payer: Self-pay

## 2020-09-06 DIAGNOSIS — M25612 Stiffness of left shoulder, not elsewhere classified: Secondary | ICD-10-CM | POA: Diagnosis not present

## 2020-09-06 DIAGNOSIS — M25512 Pain in left shoulder: Secondary | ICD-10-CM

## 2020-09-06 DIAGNOSIS — R600 Localized edema: Secondary | ICD-10-CM

## 2020-09-06 DIAGNOSIS — G8929 Other chronic pain: Secondary | ICD-10-CM | POA: Diagnosis not present

## 2020-09-06 LAB — BASIC METABOLIC PANEL
BUN/Creatinine Ratio: 20 (ref 12–28)
BUN: 14 mg/dL (ref 8–27)
CO2: 20 mmol/L (ref 20–29)
Calcium: 9.3 mg/dL (ref 8.7–10.3)
Chloride: 102 mmol/L (ref 96–106)
Creatinine, Ser: 0.7 mg/dL (ref 0.57–1.00)
Glucose: 108 mg/dL — ABNORMAL HIGH (ref 65–99)
Potassium: 4.1 mmol/L (ref 3.5–5.2)
Sodium: 141 mmol/L (ref 134–144)
eGFR: 92 mL/min/{1.73_m2} (ref >59)

## 2020-09-06 NOTE — Therapy (Signed)
[facility] [place], [place], [address] Phone: [phone]   Fax:  [phone]  Physical Therapy Treatment  Patient Details  Name: Martha Perkins MRN: [id number] Date of Birth: Oct 31, 1945 Referring Provider (PT): Roselee Culver PA-C   Encounter Date: 09/06/2020   PT End of Session - 09/06/20 1227     Visit Number 8    Number of Visits 16    Date for PT Re-Evaluation 10/10/20    Authorization Type FOTO AT LEAST EVERY 5TH VISIT.  PROGRESS NOTE AT 10TH VISIT.  KX MODIFIER AFTER 15 VISITS.    PT Start Time 1033    PT Stop Time 1124    PT Time Calculation (min) 51 min    Activity Tolerance Patient tolerated treatment well    Behavior During Therapy WFL for tasks assessed/performed             Past Medical History:  Diagnosis Date   Arthritis    neck, back, hips, knees, ankles   Asthma    use everyday   Cancer ([place]) 3329   skin   Complication of anesthesia    Dyspnea    Dysrhythmia    PVCs , Afib   Heart murmur    History of kidney stones    Hyperlipidemia    Hypertension    Hyperthyroidism 08/08/2017   radio active iodine x2   Hypothyroidism    now take synthroid   Paroxysmal atrial fibrillation ([place]) 08/09/2015   PONV (postoperative nausea and vomiting)    Thyroid disease    Tuberculosis     Past Surgical History:  Procedure Laterality Date   CARDIOVERSION N/A 08/05/2016   Procedure: CARDIOVERSION;  Surgeon: Skeet Latch, MD;  Location: [facility];  Service: Cardiovascular;  Laterality: N/A;   CATARACT EXTRACTION Left 09/2012   Dr. Bunnie Philips   LITHOTRIPSY     REPLACEMENT TOTAL KNEE BILATERAL  2011   REVERSE SHOULDER ARTHROPLASTY Left 07/19/2020   Procedure: REVERSE SHOULDER ARTHROPLASTY;  Surgeon: Justice Britain, MD;  Location: [facility];  Service: Orthopedics;  Laterality: Left;  168min   THYROIDECTOMY  1971   TUBAL LIGATION  1979    There were no vitals filed for this visit.   Subjective  Assessment - 09/06/20 1143     Subjective COVID-19 screen performed prior to patient entering clinic.  Doctor said I was doing good but wants me lifting my arm better.  Out of sling now and driving.    Pertinent History HTN, hypothyroidism, bilateral total knee replacements.    Patient Stated Goals Use left UE without pain.    Currently in Pain? Yes    Pain Score 1     Pain Location Shoulder    Pain Orientation Left    Pain Descriptors / Indicators Discomfort;Dull    Pain Onset 1 to 4 weeks ago                               [facility] Adult PT Treatment/Exercise - 09/06/20 0001       Exercises   Exercises Shoulder      Shoulder Exercises: Seated   Other Seated Exercises Seated table slides into left shoulder flexion x 5 minutes.      Shoulder Exercises: Pulleys   Flexion 5 minutes    Other Pulley Exercises Seated UE Ranger x 3 minutes.      Modalities   Modalities Electrical Stimulation;Vasopneumatic  Acupuncturist Location Left shoulder.    Electrical Stimulation Action IFC at 80-150 Hz.    Electrical Stimulation Parameters 40% scan x 20 minutes.      Vasopneumatic   Number Minutes Vasopneumatic  20 minutes    Vasopnuematic Location  --   Left shoulder. Pillow between left elbow and thorax.   Vasopneumatic Pressure Low      Manual Therapy   Manual Therapy Passive ROM    Passive ROM PROM x 10 minutes into left shoulder flexion and ER.                    PT Education - 09/06/20 1210     Education Details Home pulley system provided.  Added table slides to increase left shoulder flexion.                 PT Long Term Goals - 09/03/20 1115       PT LONG TERM GOAL #1   Title Independent with a HEP.    Time 4    Status On-going      PT LONG TERM GOAL #2   Title Active left shoulder flexion to 135 degrees so the patient can easily reach overhead.    Time 4    Period Weeks    Status On-going       PT LONG TERM GOAL #3   Title Active ER to 60 degrees+ to allow for easily donning/doffing of apparel.    Time 4    Period Weeks    Status On-going      PT LONG TERM GOAL #4   Title Increase left shoulder strength to a solid 4/5 to increase stability for performance of functional activities.    Time 4    Period Weeks    Status On-going      PT LONG TERM GOAL #5   Title Perform ADL's with pain not > 3/10.    Time 4    Period Weeks    Status On-going                   Plan - 09/06/20 1235     Clinical Impression Statement Patient did well with treatment today.  Added pulleys and she was provided with a home pulley system.  Also add table slides to increase left shoulder flexion that she will also begin as a home exercise.    Personal Factors and Comorbidities Other;Comorbidity 1;Comorbidity 2    Comorbidities HTN, hypothyroidism, bilateral total knee replacements.    Examination-Participation Restrictions Other    Stability/Clinical Decision Making Stable/Uncomplicated    Rehab Potential Excellent    PT Frequency 2x / week    PT Duration 4 weeks    PT Treatment/Interventions ADLs/Self Care Home Management;Cryotherapy;Electrical Stimulation;Moist Heat;Therapeutic activities;Therapeutic exercise;Manual techniques;Patient/family education;Passive range of motion;Vasopneumatic Device    PT Next Visit Plan Continue with left shoulder PROM.  Begin supine cane exercises and progress with AAROM.    Consulted and Agree with Plan of Care Patient             Patient will benefit from skilled therapeutic intervention in order to improve the following deficits and impairments:     Visit Diagnosis: Chronic left shoulder pain  Stiffness of left shoulder, not elsewhere classified  Localized edema     Problem List Patient Active Problem List   Diagnosis Date Noted   Aortic atherosclerosis ([place]) 12/26/2019   Palpitations 10/21/2019   Obesity (BMI 30-39.9)  10/21/2019    Encounter for chronic pain management 06/30/2019   Localized swelling of both lower extremities 09/20/2018   Hypothyroidism 09/11/2016   History of atrial fibrillation    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/14/2016   Restrictive lung disease 09/28/2015   Lumbar degenerative disc disease 08/15/2015   PAF (paroxysmal atrial fibrillation) ([place]) 08/09/2015   Chronic allergic rhinitis 05/17/2014   Cataract 06/03/2012   Hyperlipidemia LDL goal <100 12/21/2010   Essential hypertension 12/21/2010   GERD (gastroesophageal reflux disease) 07/18/2010   Insulin resistance 07/18/2010   Fatty liver 07/18/2010    Endi Lagman, [place] MPT 09/06/2020, 12:39 PM  [facility] [address] [place], [place], [address] Phone: [phone]   Fax:  [phone]  Name: Martha Perkins MRN: [id number] Date of Birth: 1945/07/13

## 2020-09-06 NOTE — Addendum Note (Signed)
Addended by: Aseneth Hack, [place] W on: 09/06/2020 01:05 PM   Modules accepted: Orders

## 2020-09-11 ENCOUNTER — Ambulatory Visit: Payer: Medicare Other | Attending: Orthopedic Surgery | Admitting: Physical Therapy

## 2020-09-11 ENCOUNTER — Encounter: Payer: Self-pay | Admitting: Physical Therapy

## 2020-09-11 ENCOUNTER — Other Ambulatory Visit: Payer: Self-pay

## 2020-09-11 DIAGNOSIS — M25612 Stiffness of left shoulder, not elsewhere classified: Secondary | ICD-10-CM | POA: Diagnosis not present

## 2020-09-11 DIAGNOSIS — G8929 Other chronic pain: Secondary | ICD-10-CM | POA: Diagnosis not present

## 2020-09-11 DIAGNOSIS — R600 Localized edema: Secondary | ICD-10-CM | POA: Insufficient documentation

## 2020-09-11 DIAGNOSIS — M25512 Pain in left shoulder: Secondary | ICD-10-CM | POA: Diagnosis not present

## 2020-09-11 NOTE — Therapy (Signed)
[facility] [place], [place], [address] Phone: [phone]   Fax:  [phone]  Physical Therapy Treatment  Patient Details  Name: Martha Perkins MRN: [id number] Date of Birth: August 17, 1945 Referring Provider (PT): Roselee Culver PA-C   Encounter Date: 09/11/2020   PT End of Session - 09/11/20 1034     Visit Number 9    Number of Visits 16    Date for PT Re-Evaluation 10/10/20    Authorization Type FOTO AT LEAST EVERY 5TH VISIT.  PROGRESS NOTE AT 10TH VISIT.  KX MODIFIER AFTER 15 VISITS.    PT Start Time 1032    PT Stop Time 1113    PT Time Calculation (min) 41 min    Activity Tolerance Patient tolerated treatment well    Behavior During Therapy WFL for tasks assessed/performed             Past Medical History:  Diagnosis Date   Arthritis    neck, back, hips, knees, ankles   Asthma    use everyday   Cancer ([place]) 9678   skin   Complication of anesthesia    Dyspnea    Dysrhythmia    PVCs , Afib   Heart murmur    History of kidney stones    Hyperlipidemia    Hypertension    Hyperthyroidism 08/08/2017   radio active iodine x2   Hypothyroidism    now take synthroid   Paroxysmal atrial fibrillation ([place]) 08/09/2015   PONV (postoperative nausea and vomiting)    Thyroid disease    Tuberculosis     Past Surgical History:  Procedure Laterality Date   CARDIOVERSION N/A 08/05/2016   Procedure: CARDIOVERSION;  Surgeon: Skeet Latch, MD;  Location: [facility];  Service: Cardiovascular;  Laterality: N/A;   CATARACT EXTRACTION Left 09/2012   Dr. Bunnie Philips   LITHOTRIPSY     REPLACEMENT TOTAL KNEE BILATERAL  2011   REVERSE SHOULDER ARTHROPLASTY Left 07/19/2020   Procedure: REVERSE SHOULDER ARTHROPLASTY;  Surgeon: Justice Britain, MD;  Location: [facility];  Service: Orthopedics;  Laterality: Left;  172min   THYROIDECTOMY  1971   TUBAL LIGATION  1979    There were no vitals filed for this visit.   Subjective  Assessment - 09/11/20 1033     Subjective COVID-19 screen performed prior to patient entering clinic. having more pain in R shoulder today. Had some needle type pains in L shoulder yesterday.    Pertinent History HTN, hypothyroidism, bilateral total knee replacements.    Patient Stated Goals Use left UE without pain.    Currently in Pain? No/denies                [facility] PT Assessment - 09/11/20 0001       Assessment   Medical Diagnosis Left total shoulder replacement.    Referring Provider (PT) Roselee Culver PA-C    Onset Date/Surgical Date 07/19/20    Hand Dominance Right    Next MD Visit 10/17/2020      Precautions   Precaution Comments No ultrasound.  Progress per protocol.                           [facility] Adult PT Treatment/Exercise - 09/11/20 0001       Shoulder Exercises: Standing   Other Standing Exercises ABCs with PB on table x1 rep      Shoulder Exercises: Pulleys   Flexion 5 minutes    Other Pulley  Exercises Seated UE Ranger x15 reps (flex, CW circles)      Modalities   Modalities Vasopneumatic      Vasopneumatic   Number Minutes Vasopneumatic  10 minutes    Vasopnuematic Location  Shoulder    Vasopneumatic Pressure Low    Vasopneumatic Temperature  34      Manual Therapy   Manual Therapy Passive ROM    Passive ROM PROM of L shoulder into flex, ER with gentle holds at end range                         PT Long Term Goals - 09/03/20 1115       PT LONG TERM GOAL #1   Title Independent with a HEP.    Time 4    Status On-going      PT LONG TERM GOAL #2   Title Active left shoulder flexion to 135 degrees so the patient can easily reach overhead.    Time 4    Period Weeks    Status On-going      PT LONG TERM GOAL #3   Title Active ER to 60 degrees+ to allow for easily donning/doffing of apparel.    Time 4    Period Weeks    Status On-going      PT LONG TERM GOAL #4   Title Increase left shoulder strength to a  solid 4/5 to increase stability for performance of functional activities.    Time 4    Period Weeks    Status On-going      PT LONG TERM GOAL #5   Title Perform ADL's with pain not > 3/10.    Time 4    Period Weeks    Status On-going                   Plan - 09/11/20 1120     Clinical Impression Statement Patient presented in clinic with no current L shoulder pain but did feel needle like sticks yesterday. Patient continued through light AAROM exercises with some discomfort with seated UE ranger into flex/ext. Patient instructed for only pain free range at this time and UE ranger was adjusted in position with less discomfort reported. Firm end feels and smooth arc of motion noted during PROM of L shoulder. Normal vasopneumatic response noted following removal of the modality.    Personal Factors and Comorbidities Other;Comorbidity 1;Comorbidity 2    Comorbidities HTN, hypothyroidism, bilateral total knee replacements.    Examination-Activity Limitations Other;Reach Overhead    Examination-Participation Restrictions Other    Stability/Clinical Decision Making Stable/Uncomplicated    Rehab Potential Excellent    PT Frequency 2x / week    PT Duration 4 weeks    PT Treatment/Interventions ADLs/Self Care Home Management;Cryotherapy;Electrical Stimulation;Moist Heat;Therapeutic activities;Therapeutic exercise;Manual techniques;Patient/family education;Passive range of motion;Vasopneumatic Device    PT Next Visit Plan Continue with left shoulder PROM.  Begin supine cane exercises and progress with AAROM.    Consulted and Agree with Plan of Care Patient             Patient will benefit from skilled therapeutic intervention in order to improve the following deficits and impairments:  Pain, Decreased activity tolerance, Decreased range of motion, Increased edema  Visit Diagnosis: Chronic left shoulder pain  Stiffness of left shoulder, not elsewhere classified  Localized  edema     Problem List Patient Active Problem List   Diagnosis Date Noted   Aortic atherosclerosis ([place])  12/26/2019   Palpitations 10/21/2019   Obesity (BMI 30-39.9) 10/21/2019   Encounter for chronic pain management 06/30/2019   Localized swelling of both lower extremities 09/20/2018   Hypothyroidism 09/11/2016   History of atrial fibrillation    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/14/2016   Restrictive lung disease 09/28/2015   Lumbar degenerative disc disease 08/15/2015   PAF (paroxysmal atrial fibrillation) ([place]) 08/09/2015   Chronic allergic rhinitis 05/17/2014   Cataract 06/03/2012   Hyperlipidemia LDL goal <100 12/21/2010   Essential hypertension 12/21/2010   GERD (gastroesophageal reflux disease) 07/18/2010   Insulin resistance 07/18/2010   Fatty liver 07/18/2010    Standley Brooking, PTA 09/11/2020, 11:23 AM  [facility] [address] [place], [place], [address] Phone: [phone]   Fax:  [phone]  Name: MAYELI BORNHORST MRN: [id number] Date of Birth: [birth year]

## 2020-09-13 ENCOUNTER — Ambulatory Visit: Payer: Medicare Other | Admitting: Family Medicine

## 2020-09-14 ENCOUNTER — Encounter: Payer: Self-pay | Admitting: Physical Therapy

## 2020-09-14 ENCOUNTER — Ambulatory Visit: Payer: Medicare Other | Admitting: Physical Therapy

## 2020-09-14 ENCOUNTER — Other Ambulatory Visit: Payer: Self-pay

## 2020-09-14 DIAGNOSIS — M25612 Stiffness of left shoulder, not elsewhere classified: Secondary | ICD-10-CM

## 2020-09-14 DIAGNOSIS — M25512 Pain in left shoulder: Secondary | ICD-10-CM

## 2020-09-14 DIAGNOSIS — G8929 Other chronic pain: Secondary | ICD-10-CM | POA: Diagnosis not present

## 2020-09-14 DIAGNOSIS — R600 Localized edema: Secondary | ICD-10-CM | POA: Diagnosis not present

## 2020-09-14 NOTE — Therapy (Signed)
[facility] [place], [place], [address] Phone: [phone]   Fax:  [phone]  Physical Therapy Treatment  Patient Details  Name: ALYSON KI MRN: [id number] Date of Birth: Feb 17, 1946 Referring Provider (PT): Roselee Culver PA-C   Encounter Date: 09/14/2020   PT End of Session - 09/14/20 1240     Visit Number 10    Number of Visits 16    Date for PT Re-Evaluation 10/10/20    Authorization Type FOTO AT LEAST EVERY 5TH VISIT.  PROGRESS NOTE AT 10TH VISIT.  KX MODIFIER AFTER 15 VISITS.    PT Start Time 1030    PT Stop Time 1117    PT Time Calculation (min) 47 min    Activity Tolerance Patient tolerated treatment well    Behavior During Therapy WFL for tasks assessed/performed             Past Medical History:  Diagnosis Date   Arthritis    neck, back, hips, knees, ankles   Asthma    use everyday   Cancer ([place]) 4970   skin   Complication of anesthesia    Dyspnea    Dysrhythmia    PVCs , Afib   Heart murmur    History of kidney stones    Hyperlipidemia    Hypertension    Hyperthyroidism 08/08/2017   radio active iodine x2   Hypothyroidism    now take synthroid   Paroxysmal atrial fibrillation ([place]) 08/09/2015   PONV (postoperative nausea and vomiting)    Thyroid disease    Tuberculosis     Past Surgical History:  Procedure Laterality Date   CARDIOVERSION N/A 08/05/2016   Procedure: CARDIOVERSION;  Surgeon: Skeet Latch, MD;  Location: [facility];  Service: Cardiovascular;  Laterality: N/A;   CATARACT EXTRACTION Left 09/2012   Dr. Bunnie Philips   LITHOTRIPSY     REPLACEMENT TOTAL KNEE BILATERAL  2011   REVERSE SHOULDER ARTHROPLASTY Left 07/19/2020   Procedure: REVERSE SHOULDER ARTHROPLASTY;  Surgeon: Justice Britain, MD;  Location: [facility];  Service: Orthopedics;  Laterality: Left;  126min   THYROIDECTOMY  1971   TUBAL LIGATION  1979    There were no vitals filed for this visit.   Subjective  Assessment - 09/14/20 1226     Subjective COVID-19 screen performed prior to patient entering clinic.  Doing okay.    Pertinent History HTN, hypothyroidism, bilateral total knee replacements.    Patient Stated Goals Use left UE without pain.    Currently in Pain? Yes    Pain Score 2     Pain Location Shoulder    Pain Orientation Left    Pain Descriptors / Indicators Discomfort;Dull    Pain Type Surgical pain    Pain Onset 1 to 4 weeks ago                [facility] PT Assessment - 09/14/20 0001       PROM   Left Shoulder Flexion 130 Degrees    Left Shoulder External Rotation 47 Degrees                           [facility] Adult PT Treatment/Exercise - 09/14/20 0001       Exercises   Exercises Shoulder      Shoulder Exercises: Pulleys   Flexion 5 minutes    Other Pulley Exercises Seated UE Ranger x 5 minutes.    Other Pulley Exercises Wall ladder x  2 minutes.      Modalities   Modalities Electrical Stimulation;Cryotherapy      Cryotherapy   Number Minutes Cryotherapy 15 Minutes    Cryotherapy Location --   Left shoulder.     Acupuncturist Location Left shoulder.    Electrical Stimulation Action IFC at 80-150 Hz.    Electrical Stimulation Parameters 40% scan x 15 minutes.    Electrical Stimulation Goals Pain      Manual Therapy   Manual Therapy Passive ROM    Passive ROM In supine:  PROM x 8 minutes to patient's left shoulder with focus on flexion and ER.                         PT Long Term Goals - 09/14/20 1241       PT LONG TERM GOAL #1   Title Independent with a HEP.    Time 4    Period Weeks    Status On-going      PT LONG TERM GOAL #2   Title Active left shoulder flexion to 135 degrees so the patient can easily reach overhead.    Baseline Passive 130 degrees (09/14/20).    Time 4    Period Weeks    Status On-going      PT LONG TERM GOAL #3   Title Active ER to 60 degrees+ to allow for  easily donning/doffing of apparel.    Baseline Passive 47    Time 4    Period Weeks    Status On-going      PT LONG TERM GOAL #4   Title Increase left shoulder strength to a solid 4/5 to increase stability for performance of functional activities.    Time 4    Period Weeks    Status On-going      PT LONG TERM GOAL #5   Time 4    Status On-going                   Plan - 09/14/20 1230     Clinical Impression Statement Patient did well today.  Added cane bench press into flexion and passive cane stretch to increase flexion.  Her passive left shoulder flexion improved to 130 degrees and ER to 47 degrees.    Personal Factors and Comorbidities Other;Comorbidity 1;Comorbidity 2    Comorbidities HTN, hypothyroidism, bilateral total knee replacements.    Examination-Activity Limitations Other;Reach Overhead    Examination-Participation Restrictions Other    Stability/Clinical Decision Making Stable/Uncomplicated    Rehab Potential Excellent    PT Frequency 2x / week    PT Duration 4 weeks    PT Treatment/Interventions ADLs/Self Care Home Management;Cryotherapy;Electrical Stimulation;Moist Heat;Therapeutic activities;Therapeutic exercise;Manual techniques;Patient/family education;Passive range of motion;Vasopneumatic Device    PT Next Visit Plan Continue with left shoulder PROM.  Begin supine cane exercises and progress with AAROM.    Consulted and Agree with Plan of Care Patient             Patient will benefit from skilled therapeutic intervention in order to improve the following deficits and impairments:  Pain, Decreased activity tolerance, Decreased range of motion, Increased edema  Visit Diagnosis: Chronic left shoulder pain  Stiffness of left shoulder, not elsewhere classified  Localized edema     Problem List Patient Active Problem List   Diagnosis Date Noted   Aortic atherosclerosis ([place]) 12/26/2019   Palpitations 10/21/2019   Obesity (BMI 30-39.9)  10/21/2019  Encounter for chronic pain management 06/30/2019   Localized swelling of both lower extremities 09/20/2018   Hypothyroidism 09/11/2016   History of atrial fibrillation    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/14/2016   Restrictive lung disease 09/28/2015   Lumbar degenerative disc disease 08/15/2015   PAF (paroxysmal atrial fibrillation) ([place]) 08/09/2015   Chronic allergic rhinitis 05/17/2014   Cataract 06/03/2012   Hyperlipidemia LDL goal <100 12/21/2010   Essential hypertension 12/21/2010   GERD (gastroesophageal reflux disease) 07/18/2010   Insulin resistance 07/18/2010   Fatty liver 07/18/2010   Progress Note Reporting Period 08/15/20 to 09/14/20  See note below for Objective Data and Assessment of Progress/Goals. Patient progressing per TSA protocol.  Goals are ongoing.    Shakena Callari, [place] MPT 09/14/2020, 12:52 PM  [facility] [address] [place], [place], [address] Phone: [phone]   Fax:  [phone]  Name: FATIMATA TALSMA MRN: [id number] Date of Birth: 1945-05-31

## 2020-09-17 ENCOUNTER — Other Ambulatory Visit: Payer: Self-pay

## 2020-09-17 ENCOUNTER — Other Ambulatory Visit: Payer: Self-pay | Admitting: Family Medicine

## 2020-09-17 ENCOUNTER — Encounter: Payer: Self-pay | Admitting: Physical Therapy

## 2020-09-17 ENCOUNTER — Ambulatory Visit: Payer: Medicare Other | Admitting: Physical Therapy

## 2020-09-17 DIAGNOSIS — R600 Localized edema: Secondary | ICD-10-CM

## 2020-09-17 DIAGNOSIS — G8929 Other chronic pain: Secondary | ICD-10-CM

## 2020-09-17 DIAGNOSIS — M25612 Stiffness of left shoulder, not elsewhere classified: Secondary | ICD-10-CM | POA: Diagnosis not present

## 2020-09-17 DIAGNOSIS — M5136 Other intervertebral disc degeneration, lumbar region: Secondary | ICD-10-CM

## 2020-09-17 DIAGNOSIS — M25512 Pain in left shoulder: Secondary | ICD-10-CM

## 2020-09-17 NOTE — Therapy (Signed)
[facility] [place], [place], [address] Phone: [phone]   Fax:  [phone]  Physical Therapy Treatment  Patient Details  Name: Martha Perkins MRN: [id number] Date of Birth: Feb 21, 1946 Referring Provider (PT): Roselee Culver PA-C   Encounter Date: 09/17/2020   PT End of Session - 09/17/20 1033     Visit Number 11    Number of Visits 16    Date for PT Re-Evaluation 10/10/20    Authorization Type FOTO AT LEAST EVERY 5TH VISIT.  PROGRESS NOTE AT 10TH VISIT.  KX MODIFIER AFTER 15 VISITS.    PT Start Time 1031    PT Stop Time 1112    PT Time Calculation (min) 41 min    Activity Tolerance Patient tolerated treatment well    Behavior During Therapy WFL for tasks assessed/performed             Past Medical History:  Diagnosis Date   Arthritis    neck, back, hips, knees, ankles   Asthma    use everyday   Cancer ([place]) 7353   skin   Complication of anesthesia    Dyspnea    Dysrhythmia    PVCs , Afib   Heart murmur    History of kidney stones    Hyperlipidemia    Hypertension    Hyperthyroidism 08/08/2017   radio active iodine x2   Hypothyroidism    now take synthroid   Paroxysmal atrial fibrillation ([place]) 08/09/2015   PONV (postoperative nausea and vomiting)    Thyroid disease    Tuberculosis     Past Surgical History:  Procedure Laterality Date   CARDIOVERSION N/A 08/05/2016   Procedure: CARDIOVERSION;  Surgeon: Skeet Latch, MD;  Location: [facility];  Service: Cardiovascular;  Laterality: N/A;   CATARACT EXTRACTION Left 09/2012   Dr. Bunnie Philips   LITHOTRIPSY     REPLACEMENT TOTAL KNEE BILATERAL  2011   REVERSE SHOULDER ARTHROPLASTY Left 07/19/2020   Procedure: REVERSE SHOULDER ARTHROPLASTY;  Surgeon: Justice Britain, MD;  [facility];  Service: Orthopedics;  Laterality: Left;  143min   THYROIDECTOMY  1971   TUBAL LIGATION  1979    There were no vitals filed for this visit.   Subjective  Assessment - 09/17/20 1033     Subjective COVID-19 screen performed prior to patient entering clinic.  Doing okay.    Pertinent History HTN, hypothyroidism, bilateral total knee replacements.    Patient Stated Goals Use left UE without pain.    Currently in Pain? Yes    Pain Score 5     Pain Location Shoulder    Pain Orientation Left    Pain Descriptors / Indicators Discomfort    Pain Type Surgical pain    Pain Onset 1 to 4 weeks ago    Pain Frequency Intermittent                [facility] PT Assessment - 09/17/20 0001       Assessment   Medical Diagnosis Left total shoulder replacement.    Referring Provider (PT) Roselee Culver PA-C    Onset Date/Surgical Date 07/19/20    Hand Dominance Right    Next MD Visit 10/17/2020      Precautions   Precaution Comments No ultrasound.  Progress per protocol.                           [facility] Adult PT Treatment/Exercise - 09/17/20 0001  Shoulder Exercises: Supine   Protraction AAROM;Both;20 reps;10 reps    Flexion AAROM;Both;20 reps      Shoulder Exercises: Pulleys   Flexion 3 minutes    Other Pulley Exercises Seated UE Ranger x 5 minutes.    Other Pulley Exercises Wall ladder x10 reps      Modalities   Modalities Vasopneumatic      Vasopneumatic   Number Minutes Vasopneumatic  10 minutes    Vasopnuematic Location  Shoulder    Vasopneumatic Pressure Low    Vasopneumatic Temperature  34      Manual Therapy   Manual Therapy Passive ROM    Passive ROM PROM of L shoulder into flex, ER with holds at end range                         PT Long Term Goals - 09/14/20 1241       PT LONG TERM GOAL #1   Title Independent with a HEP.    Time 4    Period Weeks    Status On-going      PT LONG TERM GOAL #2   Title Active left shoulder flexion to 135 degrees so the patient can easily reach overhead.    Baseline Passive 130 degrees (09/14/20).    Time 4    Period Weeks    Status On-going       PT LONG TERM GOAL #3   Title Active ER to 60 degrees+ to allow for easily donning/doffing of apparel.    Baseline Passive 47    Time 4    Period Weeks    Status On-going      PT LONG TERM GOAL #4   Title Increase left shoulder strength to a solid 4/5 to increase stability for performance of functional activities.    Time 4    Period Weeks    Status On-going      PT LONG TERM GOAL #5   Time 4    Status On-going                   Plan - 09/17/20 1331     Clinical Impression Statement Patient presented in clinic with reports of only intermittant L shoulder pain with activity. Patient able to tolerate AAROM therex fairly well with intermittant guidance. Firm end feels and smooth arc of motion noted during PROM of L shoulder. Normal vasopneumatic response noted following removal of the modality.    Personal Factors and Comorbidities Other;Comorbidity 1;Comorbidity 2    Comorbidities HTN, hypothyroidism, bilateral total knee replacements.    Examination-Activity Limitations Other;Reach Overhead    Examination-Participation Restrictions Other    Stability/Clinical Decision Making Stable/Uncomplicated    Rehab Potential Excellent    PT Frequency 2x / week    PT Duration 4 weeks    PT Treatment/Interventions ADLs/Self Care Home Management;Cryotherapy;Electrical Stimulation;Moist Heat;Therapeutic activities;Therapeutic exercise;Manual techniques;Patient/family education;Passive range of motion;Vasopneumatic Device    PT Next Visit Plan Continue with left shoulder PROM.  Begin supine cane exercises and progress with AAROM.    Consulted and Agree with Plan of Care Patient             Patient will benefit from skilled therapeutic intervention in order to improve the following deficits and impairments:  Pain, Decreased activity tolerance, Decreased range of motion, Increased edema  Visit Diagnosis: Chronic left shoulder pain  Stiffness of left shoulder, not elsewhere  classified  Localized edema     Problem  List Patient Active Problem List   Diagnosis Date Noted   Aortic atherosclerosis ([place]) 12/26/2019   Palpitations 10/21/2019   Obesity (BMI 30-39.9) 10/21/2019   Encounter for chronic pain management 06/30/2019   Localized swelling of both lower extremities 09/20/2018   Hypothyroidism 09/11/2016   History of atrial fibrillation    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/14/2016   Restrictive lung disease 09/28/2015   Lumbar degenerative disc disease 08/15/2015   PAF (paroxysmal atrial fibrillation) ([place]) 08/09/2015   Chronic allergic rhinitis 05/17/2014   Cataract 06/03/2012   Hyperlipidemia LDL goal <100 12/21/2010   Essential hypertension 12/21/2010   GERD (gastroesophageal reflux disease) 07/18/2010   Insulin resistance 07/18/2010   Fatty liver 07/18/2010    Standley Brooking, PTA 09/17/2020, 1:40 PM  [facility] [address] [place], [place], [address] Phone: [phone]   Fax:  [phone]  Name: Martha Perkins MRN: [id number] Date of Birth: [birth year]

## 2020-09-19 ENCOUNTER — Other Ambulatory Visit: Payer: Self-pay

## 2020-09-20 ENCOUNTER — Other Ambulatory Visit: Payer: Self-pay

## 2020-09-20 ENCOUNTER — Ambulatory Visit: Payer: Medicare Other | Admitting: Physical Therapy

## 2020-09-20 DIAGNOSIS — G8929 Other chronic pain: Secondary | ICD-10-CM

## 2020-09-20 DIAGNOSIS — M25612 Stiffness of left shoulder, not elsewhere classified: Secondary | ICD-10-CM | POA: Diagnosis not present

## 2020-09-20 DIAGNOSIS — R600 Localized edema: Secondary | ICD-10-CM

## 2020-09-20 DIAGNOSIS — M25512 Pain in left shoulder: Secondary | ICD-10-CM | POA: Diagnosis not present

## 2020-09-20 NOTE — Therapy (Signed)
[facility] [place], [place], [address] Phone: [phone]   Fax:  [phone]  Physical Therapy Treatment  Patient Details  Name: Martha Perkins MRN: [id number] Date of Birth: 1946-01-30 Referring Provider (PT): Roselee Culver PA-C   Encounter Date: 09/20/2020   PT End of Session - 09/20/20 1104     Visit Number 12    Number of Visits 16    Date for PT Re-Evaluation 10/10/20    Authorization Type FOTO AT LEAST EVERY 5TH VISIT.  PROGRESS NOTE AT 10TH VISIT.  KX MODIFIER AFTER 15 VISITS.    PT Start Time 1030    PT Stop Time 1122    PT Time Calculation (min) 52 min    Activity Tolerance Patient tolerated treatment well             Past Medical History:  Diagnosis Date   Arthritis    neck, back, hips, knees, ankles   Asthma    use everyday   Cancer ([place]) 2330   skin   Complication of anesthesia    Dyspnea    Dysrhythmia    PVCs , Afib   Heart murmur    History of kidney stones    Hyperlipidemia    Hypertension    Hyperthyroidism 08/08/2017   radio active iodine x2   Hypothyroidism    now take synthroid   Paroxysmal atrial fibrillation ([place]) 08/09/2015   PONV (postoperative nausea and vomiting)    Thyroid disease    Tuberculosis     Past Surgical History:  Procedure Laterality Date   CARDIOVERSION N/A 08/05/2016   Procedure: CARDIOVERSION;  Surgeon: Skeet Latch, MD;  Location: [facility];  Service: Cardiovascular;  Laterality: N/A;   CATARACT EXTRACTION Left 09/2012   Dr. Bunnie Philips   LITHOTRIPSY     REPLACEMENT TOTAL KNEE BILATERAL  2011   REVERSE SHOULDER ARTHROPLASTY Left 07/19/2020   Procedure: REVERSE SHOULDER ARTHROPLASTY;  Surgeon: Justice Britain, MD;  [facility];  Service: Orthopedics;  Laterality: Left;  173min   THYROIDECTOMY  1971   TUBAL LIGATION  1979    There were no vitals filed for this visit.   Subjective Assessment - 09/20/20 1103     Subjective COVID-19 screen  performed prior to patient entering clinic.  Patient not feeling well due to a shingles vac.    Pertinent History HTN, hypothyroidism, bilateral total knee replacements.    Patient Stated Goals Use left UE without pain.    Currently in Pain? Yes    Pain Score 5     Pain Location Shoulder    Pain Orientation Left    Pain Descriptors / Indicators Discomfort    Pain Onset 1 to 4 weeks ago                               [facility] Adult PT Treatment/Exercise - 09/20/20 0001       Modalities   Modalities Electrical Stimulation;Cryotherapy      Cryotherapy   Number Minutes Cryotherapy 20 Minutes    Cryotherapy Location --   Left shoulder.     Acupuncturist Location Left shoulder.    Electrical Stimulation Action IFC at 80-150 Hz. x 20 minutes.    Electrical Stimulation Goals Pain      Manual Therapy   Manual Therapy Passive ROM    Passive ROM In supine:  PROM x 23 minutes to patient's  left shoulder into flexion and ER with low load long duration stretching technique utilized.                         PT Long Term Goals - 09/14/20 1241       PT LONG TERM GOAL #1   Title Independent with a HEP.    Time 4    Period Weeks    Status On-going      PT LONG TERM GOAL #2   Title Active left shoulder flexion to 135 degrees so the patient can easily reach overhead.    Baseline Passive 130 degrees (09/14/20).    Time 4    Period Weeks    Status On-going      PT LONG TERM GOAL #3   Title Active ER to 60 degrees+ to allow for easily donning/doffing of apparel.    Baseline Passive 47    Time 4    Period Weeks    Status On-going      PT LONG TERM GOAL #4   Title Increase left shoulder strength to a solid 4/5 to increase stability for performance of functional activities.    Time 4    Period Weeks    Status On-going      PT LONG TERM GOAL #5   Time 4    Status On-going                   Plan - 09/20/20  1107     Clinical Impression Statement Patient not feeling well due to a a shingles vac.  Continued with left shoulder stretching today which she tolerated very well.    Personal Factors and Comorbidities Other;Comorbidity 1;Comorbidity 2    Comorbidities HTN, hypothyroidism, bilateral total knee replacements.    Examination-Activity Limitations Other;Reach Overhead    Examination-Participation Restrictions Other    Stability/Clinical Decision Making Stable/Uncomplicated    Rehab Potential Excellent    PT Frequency 2x / week    PT Duration 4 weeks    PT Treatment/Interventions ADLs/Self Care Home Management;Cryotherapy;Electrical Stimulation;Moist Heat;Therapeutic activities;Therapeutic exercise;Manual techniques;Patient/family education;Passive range of motion;Vasopneumatic Device    PT Next Visit Plan Continue with left shoulder PROM.  Begin supine cane exercises and progress with AAROM.             Patient will benefit from skilled therapeutic intervention in order to improve the following deficits and impairments:  Pain, Decreased activity tolerance, Decreased range of motion, Increased edema  Visit Diagnosis: Chronic left shoulder pain  Stiffness of left shoulder, not elsewhere classified  Localized edema     Problem List Patient Active Problem List   Diagnosis Date Noted   Aortic atherosclerosis ([place]) 12/26/2019   Palpitations 10/21/2019   Obesity (BMI 30-39.9) 10/21/2019   Encounter for chronic pain management 06/30/2019   Localized swelling of both lower extremities 09/20/2018   Hypothyroidism 09/11/2016   History of atrial fibrillation    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/14/2016   Restrictive lung disease 09/28/2015   Lumbar degenerative disc disease 08/15/2015   PAF (paroxysmal atrial fibrillation) ([place]) 08/09/2015   Chronic allergic rhinitis 05/17/2014   Cataract 06/03/2012   Hyperlipidemia LDL goal <100 12/21/2010   Essential hypertension  12/21/2010   GERD (gastroesophageal reflux disease) 07/18/2010   Insulin resistance 07/18/2010   Fatty liver 07/18/2010    [name], [place] MPT 09/20/2020, 11:32 AM  [facility] [address] [place], [place], [address] Phone: [phone]  Fax:  [phone]  Name: Martha Perkins MRN: [id number] Date of Birth: [birth year]

## 2020-09-24 ENCOUNTER — Other Ambulatory Visit: Payer: Self-pay

## 2020-09-24 ENCOUNTER — Ambulatory Visit: Payer: Medicare Other | Admitting: Physical Therapy

## 2020-09-24 DIAGNOSIS — M25512 Pain in left shoulder: Secondary | ICD-10-CM | POA: Diagnosis not present

## 2020-09-24 DIAGNOSIS — R600 Localized edema: Secondary | ICD-10-CM | POA: Diagnosis not present

## 2020-09-24 DIAGNOSIS — G8929 Other chronic pain: Secondary | ICD-10-CM

## 2020-09-24 DIAGNOSIS — M25612 Stiffness of left shoulder, not elsewhere classified: Secondary | ICD-10-CM | POA: Diagnosis not present

## 2020-09-24 NOTE — Therapy (Signed)
[facility] [place], [place], [address] Phone: [phone]   Fax:  [phone]  Physical Therapy Treatment  Patient Details  Name: Martha Perkins MRN: [id number] Date of Birth: March 08, 1946 Referring Provider (PT): Roselee Culver PA-C   Encounter Date: 09/24/2020   PT End of Session - 09/24/20 1159     Visit Number 13    Number of Visits 16    Date for PT Re-Evaluation 10/10/20    Authorization Type FOTO AT LEAST EVERY 5TH VISIT.  PROGRESS NOTE AT 10TH VISIT.  KX MODIFIER AFTER 15 VISITS.    PT Start Time 1030    PT Stop Time 1122    PT Time Calculation (min) 52 min    Activity Tolerance Patient tolerated treatment well    Behavior During Therapy WFL for tasks assessed/performed             Past Medical History:  Diagnosis Date   Arthritis    neck, back, hips, knees, ankles   Asthma    use everyday   Cancer ([place]) 9169   skin   Complication of anesthesia    Dyspnea    Dysrhythmia    PVCs , Afib   Heart murmur    History of kidney stones    Hyperlipidemia    Hypertension    Hyperthyroidism 08/08/2017   radio active iodine x2   Hypothyroidism    now take synthroid   Paroxysmal atrial fibrillation ([place]) 08/09/2015   PONV (postoperative nausea and vomiting)    Thyroid disease    Tuberculosis     Past Surgical History:  Procedure Laterality Date   CARDIOVERSION N/A 08/05/2016   Procedure: CARDIOVERSION;  Surgeon: Skeet Latch, MD;  Location: [facility];  Service: Cardiovascular;  Laterality: N/A;   CATARACT EXTRACTION Left 09/2012   Dr. Bunnie Philips   LITHOTRIPSY     REPLACEMENT TOTAL KNEE BILATERAL  2011   REVERSE SHOULDER ARTHROPLASTY Left 07/19/2020   Procedure: REVERSE SHOULDER ARTHROPLASTY;  Surgeon: Justice Britain, MD;  Location: [facility];  Service: Orthopedics;  Laterality: Left;  139min   THYROIDECTOMY  1971   TUBAL LIGATION  1979    There were no vitals filed for this visit.   Subjective  Assessment - 09/24/20 1150     Subjective COVID-19 screen performed prior to patient entering clinic.  Might have over done it with my home pulleys.  Hurting more today but intermittment.    Pertinent History HTN, hypothyroidism, bilateral total knee replacements.    Currently in Pain? Yes    Pain Score 6     Pain Location Shoulder    Pain Orientation Left    Pain Descriptors / Indicators Discomfort;Sharp    Pain Type Surgical pain    Pain Onset 1 to 4 weeks ago                               [facility] Adult PT Treatment/Exercise - 09/24/20 0001       Shoulder Exercises: Pulleys   Flexion 5 minutes      Modalities   Modalities Electrical Stimulation;Moist Heat      Moist Heat Therapy   Number Minutes Moist Heat 20 Minutes    Moist Heat Location --   Left shoulder.     Acupuncturist Location Left shoulder.    Electrical Stimulation Action IFC at 80-150 Hz on 40% scan x 20 minutes.  Electrical Stimulation Goals Pain      Manual Therapy   Manual Therapy Passive ROM    Passive ROM In supine:  PROM x 18 minutes to patient's left shoulder with focus on flexion and ER.                         PT Long Term Goals - 09/14/20 1241       PT LONG TERM GOAL #1   Title Independent with a HEP.    Time 4    Period Weeks    Status On-going      PT LONG TERM GOAL #2   Title Active left shoulder flexion to 135 degrees so the patient can easily reach overhead.    Baseline Passive 130 degrees (09/14/20).    Time 4    Period Weeks    Status On-going      PT LONG TERM GOAL #3   Title Active ER to 60 degrees+ to allow for easily donning/doffing of apparel.    Baseline Passive 47    Time 4    Period Weeks    Status On-going      PT LONG TERM GOAL #4   Title Increase left shoulder strength to a solid 4/5 to increase stability for performance of functional activities.    Time 4    Period Weeks    Status On-going       PT LONG TERM GOAL #5   Time 4    Status On-going                   Plan - 09/24/20 1157     Clinical Impression Statement Patient with some increased pain today and palpable tenderness over her left middle deltoid.  She feels she might have overdid the pulleys at home.  Her husband is alos assisiting her with wall climbs.    Personal Factors and Comorbidities Other;Comorbidity 1;Comorbidity 2    Comorbidities HTN, hypothyroidism, bilateral total knee replacements.    Examination-Activity Limitations Other;Reach Overhead    Examination-Participation Restrictions Other    Stability/Clinical Decision Making Stable/Uncomplicated    Rehab Potential Excellent    PT Frequency 2x / week    PT Treatment/Interventions ADLs/Self Care Home Management;Cryotherapy;Electrical Stimulation;Moist Heat;Therapeutic activities;Therapeutic exercise;Manual techniques;Patient/family education;Passive range of motion;Vasopneumatic Device    Consulted and Agree with Plan of Care Patient             Patient will benefit from skilled therapeutic intervention in order to improve the following deficits and impairments:  Pain, Decreased activity tolerance, Decreased range of motion, Increased edema  Visit Diagnosis: Chronic left shoulder pain  Stiffness of left shoulder, not elsewhere classified  Localized edema     Problem List Patient Active Problem List   Diagnosis Date Noted   Aortic atherosclerosis ([place]) 12/26/2019   Palpitations 10/21/2019   Obesity (BMI 30-39.9) 10/21/2019   Encounter for chronic pain management 06/30/2019   Localized swelling of both lower extremities 09/20/2018   Hypothyroidism 09/11/2016   History of atrial fibrillation    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/14/2016   Restrictive lung disease 09/28/2015   Lumbar degenerative disc disease 08/15/2015   PAF (paroxysmal atrial fibrillation) ([place]) 08/09/2015   Chronic allergic rhinitis 05/17/2014    Cataract 06/03/2012   Hyperlipidemia LDL goal <100 12/21/2010   Essential hypertension 12/21/2010   GERD (gastroesophageal reflux disease) 07/18/2010   Insulin resistance 07/18/2010   Fatty liver 07/18/2010    Brandis Wixted,  [place] MPT 09/24/2020, 12:00 PM  [facility] [place], [place], [address] Phone: [phone]   Fax:  [phone]  Name: AUDRYNA WENDT MRN: [id number] Date of Birth: 1945-11-22

## 2020-09-27 ENCOUNTER — Other Ambulatory Visit: Payer: Self-pay

## 2020-09-27 ENCOUNTER — Encounter: Payer: Self-pay | Admitting: Physical Therapy

## 2020-09-27 ENCOUNTER — Ambulatory Visit: Payer: Medicare Other | Admitting: Physical Therapy

## 2020-09-27 DIAGNOSIS — M25612 Stiffness of left shoulder, not elsewhere classified: Secondary | ICD-10-CM

## 2020-09-27 DIAGNOSIS — G8929 Other chronic pain: Secondary | ICD-10-CM

## 2020-09-27 DIAGNOSIS — R600 Localized edema: Secondary | ICD-10-CM

## 2020-09-27 DIAGNOSIS — M25512 Pain in left shoulder: Secondary | ICD-10-CM

## 2020-09-27 NOTE — Therapy (Signed)
[facility] [place], [place], [address] Phone: [phone]   Fax:  [phone]  Physical Therapy Treatment  Patient Details  Name: Martha Perkins MRN: [id number] Date of Birth: Jul 11, 1945 Referring Provider (PT): Roselee Culver PA-C   Encounter Date: 09/27/2020   PT End of Session - 09/27/20 1039     Visit Number 14    Number of Visits 16    Date for PT Re-Evaluation 10/10/20    Authorization Type FOTO AT LEAST EVERY 5TH VISIT.  PROGRESS NOTE AT 10TH VISIT.  KX MODIFIER AFTER 15 VISITS.    PT Start Time 1033    PT Stop Time 1114    PT Time Calculation (min) 41 min    Activity Tolerance Patient tolerated treatment well    Behavior During Therapy WFL for tasks assessed/performed             Past Medical History:  Diagnosis Date   Arthritis    neck, back, hips, knees, ankles   Asthma    use everyday   Cancer ([place]) 6203   skin   Complication of anesthesia    Dyspnea    Dysrhythmia    PVCs , Afib   Heart murmur    History of kidney stones    Hyperlipidemia    Hypertension    Hyperthyroidism 08/08/2017   radio active iodine x2   Hypothyroidism    now take synthroid   Paroxysmal atrial fibrillation ([place]) 08/09/2015   PONV (postoperative nausea and vomiting)    Thyroid disease    Tuberculosis     Past Surgical History:  Procedure Laterality Date   CARDIOVERSION N/A 08/05/2016   Procedure: CARDIOVERSION;  Surgeon: Skeet Latch, MD;  Location: [facility];  Service: Cardiovascular;  Laterality: N/A;   CATARACT EXTRACTION Left 09/2012   Dr. Bunnie Philips   LITHOTRIPSY     REPLACEMENT TOTAL KNEE BILATERAL  2011   REVERSE SHOULDER ARTHROPLASTY Left 07/19/2020   Procedure: REVERSE SHOULDER ARTHROPLASTY;  Surgeon: Justice Britain, MD;  Location: [facility];  Service: Orthopedics;  Laterality: Left;  133min   THYROIDECTOMY  1971   TUBAL LIGATION  1979    There were no vitals filed for this visit.   Subjective  Assessment - 09/27/20 1035     Subjective COVID-19 screen performed prior to patient entering clinic. Reports more sciatic related pain today. Reports that her shoulder feels pretty good but reports intermittant muscle spasms.    Pertinent History HTN, hypothyroidism, bilateral total knee replacements.    Patient Stated Goals Use left UE without pain.    Currently in Pain? No/denies                [facility] PT Assessment - 09/27/20 0001       Assessment   Medical Diagnosis Left total shoulder replacement.    Referring Provider (PT) Roselee Culver PA-C    Onset Date/Surgical Date 07/19/20    Hand Dominance Right    Next MD Visit 10/17/2020      Precautions   Precaution Comments No ultrasound.  Progress per protocol.      PROM   Overall PROM  Deficits    PROM Assessment Site Shoulder    Right/Left Shoulder Left    Left Shoulder External Rotation 33 Degrees                           [facility] Adult PT Treatment/Exercise - 09/27/20 0001  Shoulder Exercises: Supine   Protraction AROM;Left;20 reps    Flexion AROM;Left;20 reps    Other Supine Exercises L upper cut x20 reps      Shoulder Exercises: Pulleys   Flexion 5 minutes      Shoulder Exercises: ROM/Strengthening   Wall [place] into flexion x10 reps      Shoulder Exercises: Isometric Strengthening   Flexion 3X5"    External Rotation 3X5"    ABduction 3X5"      Modalities   Modalities Vasopneumatic      Vasopneumatic   Number Minutes Vasopneumatic  10 minutes    Vasopnuematic Location  Shoulder    Vasopneumatic Pressure Low    Vasopneumatic Temperature  34      Manual Therapy   Manual Therapy Passive ROM    Passive ROM PROM of L shoulder into flexion, ER, IR with light holds at end range                         PT Long Term Goals - 09/14/20 1241       PT LONG TERM GOAL #1   Title Independent with a HEP.    Time 4    Period Weeks    Status On-going      PT LONG TERM GOAL  #2   Title Active left shoulder flexion to 135 degrees so the patient can easily reach overhead.    Baseline Passive 130 degrees (09/14/20).    Time 4    Period Weeks    Status On-going      PT LONG TERM GOAL #3   Title Active ER to 60 degrees+ to allow for easily donning/doffing of apparel.    Baseline Passive 47    Time 4    Period Weeks    Status On-going      PT LONG TERM GOAL #4   Title Increase left shoulder strength to a solid 4/5 to increase stability for performance of functional activities.    Time 4    Period Weeks    Status On-going      PT LONG TERM GOAL #5   Time 4    Status On-going                   Plan - 09/27/20 1218     Clinical Impression Statement Patient presented in clinic with no L shoulder pain. Patient introduced to light isometric strengthening as well as new AROM exercises per 10 weeks postop. No complaints of pain with any AROM exercises but did report isometric discomfort. Firm end feels and smooth arc of motion noted during PROM. Normal vasopneumatic response noted following removal of the modality. Patient instructed for 1x/week for 2 weeks to save visits for R TSR that she wants to schedule this year.    Personal Factors and Comorbidities Other;Comorbidity 1;Comorbidity 2    Comorbidities HTN, hypothyroidism, bilateral total knee replacements.    Examination-Activity Limitations Other;Reach Overhead    Examination-Participation Restrictions Other    Stability/Clinical Decision Making Stable/Uncomplicated    Rehab Potential Excellent    PT Frequency 2x / week    PT Duration 4 weeks    PT Treatment/Interventions ADLs/Self Care Home Management;Cryotherapy;Electrical Stimulation;Moist Heat;Therapeutic activities;Therapeutic exercise;Manual techniques;Patient/family education;Passive range of motion;Vasopneumatic Device    PT Next Visit Plan Progress AROM/isometrics into sitting.    Consulted and Agree with Plan of Care Patient  Patient will benefit from skilled therapeutic intervention in order to improve the following deficits and impairments:  Pain, Decreased activity tolerance, Decreased range of motion, Increased edema  Visit Diagnosis: Chronic left shoulder pain  Stiffness of left shoulder, not elsewhere classified  Localized edema     Problem List Patient Active Problem List   Diagnosis Date Noted   Aortic atherosclerosis ([place]) 12/26/2019   Palpitations 10/21/2019   Obesity (BMI 30-39.9) 10/21/2019   Encounter for chronic pain management 06/30/2019   Localized swelling of both lower extremities 09/20/2018   Hypothyroidism 09/11/2016   History of atrial fibrillation    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/14/2016   Restrictive lung disease 09/28/2015   Lumbar degenerative disc disease 08/15/2015   PAF (paroxysmal atrial fibrillation) ([place]) 08/09/2015   Chronic allergic rhinitis 05/17/2014   Cataract 06/03/2012   Hyperlipidemia LDL goal <100 12/21/2010   Essential hypertension 12/21/2010   GERD (gastroesophageal reflux disease) 07/18/2010   Insulin resistance 07/18/2010   Fatty liver 07/18/2010    Standley Brooking, PTA 09/27/2020, 12:24 PM  [facility] [address] [place], [place], [address] Phone: [phone]   Fax:  [phone]  Name: SAPHIRE BARNHART MRN: [id number] Date of Birth: [birth year]

## 2020-09-30 ENCOUNTER — Other Ambulatory Visit: Payer: Self-pay | Admitting: Family Medicine

## 2020-10-03 ENCOUNTER — Ambulatory Visit (INDEPENDENT_AMBULATORY_CARE_PROVIDER_SITE_OTHER): Payer: Medicare Other | Admitting: Family Medicine

## 2020-10-03 ENCOUNTER — Other Ambulatory Visit: Payer: Self-pay

## 2020-10-03 ENCOUNTER — Encounter: Payer: Self-pay | Admitting: Family Medicine

## 2020-10-03 VITALS — BP 137/58 | HR 74 | Ht 62.0 in | Wt 211.0 lb

## 2020-10-03 DIAGNOSIS — H938X2 Other specified disorders of left ear: Secondary | ICD-10-CM | POA: Diagnosis not present

## 2020-10-03 DIAGNOSIS — I10 Essential (primary) hypertension: Secondary | ICD-10-CM

## 2020-10-03 DIAGNOSIS — E8881 Metabolic syndrome: Secondary | ICD-10-CM | POA: Diagnosis not present

## 2020-10-03 DIAGNOSIS — I872 Venous insufficiency (chronic) (peripheral): Secondary | ICD-10-CM | POA: Diagnosis not present

## 2020-10-03 LAB — POCT GLYCOSYLATED HEMOGLOBIN (HGB A1C): Hemoglobin A1C: 5.5 % (ref 4.0–5.6)

## 2020-10-03 MED ORDER — CYCLOBENZAPRINE HCL 5 MG PO TABS
5.0000 mg | ORAL_TABLET | Freq: Every evening | ORAL | 1 refills | Status: DC | PRN
Start: 2020-10-03 — End: 2020-11-14

## 2020-10-03 MED ORDER — PREDNISONE 20 MG PO TABS: 40.0000 mg | ORAL_TABLET | Freq: Every day | ORAL | 0 refills | Status: DC

## 2020-10-03 NOTE — Progress Notes (Signed)
Bilateral lower leg swelling and redness. Worse on L leg. Unable to get compression stockings on. She does try to keep legs elevated as much as possible.  L ear

## 2020-10-03 NOTE — Assessment & Plan Note (Signed)
BP fair but not ideal. Has f/u with Card tomorrow. May need adjustment on her regimen for better BP control

## 2020-10-03 NOTE — Assessment & Plan Note (Signed)
Encouraged her to try to wear her compression stockings when she is able to and has her husband's help if not she can also try doing Ace wraps which would help and she might be able to do those on her own again just reminded her that that really is the mainstay of treatment and that the diuretic is only can I do so much work we also discussed how her weight is also contributing to her venous stasis I would love to see her get her weight down about 20 to 30 pounds I really feel like it would make a big difference.

## 2020-10-03 NOTE — Assessment & Plan Note (Signed)
We discussed continue to work on weight loss.

## 2020-10-03 NOTE — Assessment & Plan Note (Signed)
A1C looks great at 5.5 today.

## 2020-10-03 NOTE — Progress Notes (Signed)
Established Patient Office Visit  Subjective:  Patient ID: Martha Perkins, female    DOB: 03/25/1945  Age: 75 y.o. MRN: [id number]  CC:  Chief Complaint  Patient presents with   Hypertension    HPI Martha Perkins presents for  Hypertension- Pt denies chest pain, SOB, dizziness, or heart palpitations.  Taking meds as directed w/o problems.  Denies medication side effects.  She did note that her diltiazem was recently changed to valsartan by cardiology in fact she has a follow-up with them tomorrow.  Been tolerating the medication well without any problems or side effects.    Still having redness and swelling in her lower legs.  She hasn't been wearing her stocking s regularly.   Left ear with roaring and ringing for about a yr.  But for the last month she has felt like it is clogged.    She was given flexeril to take at night around the time of her surgery.  She says it helps her sleep. She would like a refill since having pain on her left shoulder and will have surgery on that in October. She did great with her surgery on the left and is still doing rehab.    Past Medical History:  Diagnosis Date   Arthritis    neck, back, hips, knees, ankles   Asthma    use everyday   Cancer ([place]) 3704   skin   Complication of anesthesia    Dyspnea    Dysrhythmia    PVCs , Afib   Heart murmur    History of kidney stones    Hyperlipidemia    Hypertension    Hyperthyroidism 08/08/2017   radio active iodine x2   Hypothyroidism    now take synthroid   Paroxysmal atrial fibrillation ([place]) 08/09/2015   PONV (postoperative nausea and vomiting)    Thyroid disease    Tuberculosis     Past Surgical History:  Procedure Laterality Date   CARDIOVERSION N/A 08/05/2016   Procedure: CARDIOVERSION;  Surgeon: Skeet Latch, MD;  Location: [facility];  Service: Cardiovascular;  Laterality: N/A;   CATARACT EXTRACTION Left 09/2012   Dr. Bunnie Philips   LITHOTRIPSY     REPLACEMENT TOTAL KNEE  BILATERAL  2011   REVERSE SHOULDER ARTHROPLASTY Left 07/19/2020   Procedure: REVERSE SHOULDER ARTHROPLASTY;  Surgeon: Justice Britain, MD;  Location: [facility];  Service: Orthopedics;  Laterality: Left;  143mn   THYROIDECTOMY  1971   TUBAL LIGATION  1979    Family History  Problem Relation Age of Onset   Coronary artery disease Mother 669      deceased   Depression Mother    Hyperlipidemia Mother    Diabetes Paternal Grandmother    Thyroid disease Sister    COPD Father    Breast cancer Neg Hx     Social History   Socioeconomic History   Marital status: Married    Spouse name: SRemo Lipps  Number of children: 2   Years of education: 16   Highest education level: Bachelor's degree (e.g., BA, AB, BS)  Occupational History   Occupation: Retired RTherapist, sports   Tobacco Use   Smoking status: Never   Smokeless tobacco: Never  Vaping Use   Vaping Use: Never used  Substance and Sexual Activity   Alcohol use: No   Drug use: No   Sexual activity: Not Currently  Other Topics Concern   Not on file  Social History Narrative   Stay around the house. Go  to grocery store.    Social Determinants of Health   Financial Resource Strain: Not on file  Food Insecurity: Not on file  Transportation Needs: Not on file  Physical Activity: Not on file  Stress: Not on file  Social Connections: Not on file  Intimate Partner Violence: Not on file    Outpatient Medications Prior to Visit  Medication Sig Dispense Refill   acetaminophen (TYLENOL) 500 MG tablet Take 500 mg by mouth every 6 (six) hours as needed for moderate pain or headache.     albuterol (VENTOLIN HFA) 108 (90 Base) MCG/ACT inhaler INHALE TWO PUFFS INTO THE LUNGS EVERY 6 HOURS AS NEEDED FOR WHEEZING OR SHORTNESS OF BREATH (Patient taking differently: Inhale 2 puffs into the lungs every 6 (six) hours as needed for shortness of breath or wheezing.) 8.5 g 2   atorvastatin (LIPITOR) 40 MG tablet TAKE 1 TABLET BY MOUTH  DAILY (Patient taking  differently: Take 40 mg by mouth daily.) 90 tablet 3   Biotin 1000 MCG tablet Take 1,000 mcg by mouth daily.     Cholecalciferol (VITAMIN D) 50 MCG (2000 UT) tablet Take 4,000 Units by mouth daily.     clotrimazole-betamethasone (LOTRISONE) cream Apply 1 application topically 2 (two) times daily. (Patient taking differently: Apply 1 application topically 2 (two) times daily as needed (fungus).) 45 g 2   Coenzyme Q10 (COQ-10) 100 MG CAPS Take 100 mg by mouth daily.     fluticasone (FLONASE) 50 MCG/ACT nasal spray USE 1 SPRAY IN BOTH  NOSTRILS TWICE DAILY (Patient taking differently: Place 1 spray into both nostrils 2 (two) times daily.) 48 g 3   Fluticasone Furoate (ARNUITY ELLIPTA) 100 MCG/ACT AEPB Inhale 1 Inhaler into the lungs daily. 30 each 2   furosemide (LASIX) 20 MG tablet Take 3 tablets (60 mg total) by mouth daily. 90 tablet 3   ketoconazole (NIZORAL) 2 % cream Apply topically daily as needed for irritation. (Patient taking differently: Apply 1 application topically daily as needed for irritation.) 60 g 3   levothyroxine (SYNTHROID) 150 MCG tablet TAKE 1 TABLET BY MOUTH  DAILY BEFORE BREAKFAST TAKE EXTRA ONE-HALF TABLET BY  MOUTH 1 DAY WEEK 96 tablet 3   loratadine (CLARITIN) 10 MG tablet Take 10 mg by mouth at bedtime.     Magnesium Citrate 200 MG TABS Take 400 mg by mouth daily.     meloxicam (MOBIC) 15 MG tablet Take 1 tablet (15 mg total) by mouth daily. 30 tablet 1   metoprolol succinate (TOPROL-XL) 50 MG 24 hr tablet TAKE 1 TABLET BY MOUTH  DAILY WITH OR IMMEDIATELY  FOLLOWING A MEAL 90 tablet 3   Multiple Vitamins-Minerals (ZINC PO) Take 1 tablet by mouth daily.     potassium chloride (KLOR-CON) 10 MEQ tablet TAKE 1 TABLET BY MOUTH  DAILY AS NEEDED 90 tablet 3   traMADol (ULTRAM) 50 MG tablet TAKE ONE TABLET BY MOUTH THREE TIMES DAILY AS NEEDED 90 tablet 0   TURMERIC PO Take 1,500 mg by mouth in the morning and at bedtime.     valsartan (DIOVAN) 160 MG tablet Take 1 tablet (160 mg  total) by mouth daily. 90 tablet 3   vitamin B-12 (CYANOCOBALAMIN) 1000 MCG tablet Take 1,000 mcg by mouth daily.     vitamin C (ASCORBIC ACID) 500 MG tablet Take 500 mg by mouth 2 (two) times daily.     Zinc 50 MG CAPS Take 50 mg by mouth daily.     cyclobenzaprine (FLEXERIL) 10  MG tablet Take 1 tablet (10 mg total) by mouth 3 (three) times daily as needed for muscle spasms. 30 tablet 1   No facility-administered medications prior to visit.    Allergies  Allergen Reactions   Demerol Nausea And Vomiting    ROS Review of Systems    Objective:    Physical Exam Constitutional:      Appearance: She is well-developed.  HENT:     Head: Normocephalic and atraumatic.     Right Ear: Tympanic membrane, ear canal and external ear normal.     Left Ear: Tympanic membrane, ear canal and external ear normal.     Nose: Nose normal.     Mouth/Throat:     Mouth: Mucous membranes are moist.     Pharynx: Oropharynx is clear. No oropharyngeal exudate or posterior oropharyngeal erythema.  Eyes:     Conjunctiva/sclera: Conjunctivae normal.     Pupils: Pupils are equal, round, and reactive to light.  Neck:     Thyroid: No thyromegaly.  Cardiovascular:     Rate and Rhythm: Normal rate and regular rhythm.     Heart sounds: Normal heart sounds.  Pulmonary:     Effort: Pulmonary effort is normal.     Breath sounds: Normal breath sounds. No wheezing.  Musculoskeletal:     Cervical back: Neck supple.  Lymphadenopathy:     Cervical: No cervical adenopathy.  Skin:    General: Skin is warm and dry.     Comments: 1+ pitting edema around the ankles bilaterally with some mild erythema.  Neurological:     Mental Status: She is alert and oriented to person, place, and time.    BP (!) 137/58   Pulse 74   Ht $R'5\' 2"'te$  (1.575 m)   Wt 211 lb (95.7 kg)   SpO2 96%   BMI 38.59 kg/m  Wt Readings from Last 3 Encounters:  10/03/20 211 lb (95.7 kg)  08/30/20 209 lb 12.8 oz (95.2 kg)  07/19/20 210 lb (95.3  kg)     Health Maintenance Due  Topic Date Due   COVID-19 Vaccine (4 - Booster for Moderna series) 04/25/2020    There are no preventive care reminders to display for this patient.  Lab Results  Component Value Date   TSH 0.46 07/10/2020   Lab Results  Component Value Date   WBC 5.5 07/11/2020   HGB 13.7 07/11/2020   HCT 41.2 07/11/2020   MCV 93.6 07/11/2020   PLT 206 07/11/2020   Lab Results  Component Value Date   NA 141 09/05/2020   K 4.1 09/05/2020   CO2 20 09/05/2020   GLUCOSE 108 (H) 09/05/2020   BUN 14 09/05/2020   CREATININE 0.70 09/05/2020   BILITOT 0.9 07/10/2020   ALKPHOS 119 08/25/2016   AST 26 07/10/2020   ALT 38 (H) 07/10/2020   PROT 6.8 07/10/2020   ALBUMIN 4.4 08/25/2016   CALCIUM 9.3 09/05/2020   EGFR 92 09/05/2020   Lab Results  Component Value Date   CHOL 192 07/10/2020   Lab Results  Component Value Date   HDL 46 (L) 07/10/2020   Lab Results  Component Value Date   LDLCALC 117 (H) 07/10/2020   Lab Results  Component Value Date   TRIG 171 (H) 07/10/2020   Lab Results  Component Value Date   CHOLHDL 4.2 07/10/2020   Lab Results  Component Value Date   HGBA1C 5.5 10/03/2020      Assessment & Plan:   Problem List Items  Addressed This Visit       Cardiovascular and Mediastinum   Venous insufficiency of both lower extremities    Encouraged her to try to wear her compression stockings when she is able to and has her husband's help if not she can also try doing Ace wraps which would help and she might be able to do those on her own again just reminded her that that really is the mainstay of treatment and that the diuretic is only can I do so much work we also discussed how her weight is also contributing to her venous stasis I would love to see her get her weight down about 20 to 30 pounds I really feel like it would make a big difference.       Essential hypertension    BP fair but not ideal. Has f/u with Card tomorrow. May  need adjustment on her regimen for better BP control          Endocrine   Insulin resistance - Primary    A1C looks great at 5.5 today.        Relevant Orders   POCT glycosylated hemoglobin (Hb A1C) (Completed)     Other   Morbid obesity ([place])    We discussed continue to work on weight loss.         Other Visit Diagnoses     Ear fullness, left           Left ear fullness - will treat with prednisone.  Call if not better in one week.      Shoulder pain-she does get some relief with the Flexeril we did discuss that for short-term I think it is okay to use that she does not feel sedated in the mornings on it but it is not a safe long-term medication.  I will go ahead and refill it for now but she may also need to speak with her orthopedist.  Meds ordered this encounter  Medications   predniSONE (DELTASONE) 20 MG tablet    Sig: Take 2 tablets (40 mg total) by mouth daily with breakfast.    Dispense:  10 tablet    Refill:  0   cyclobenzaprine (FLEXERIL) 5 MG tablet    Sig: Take 1 tablet (5 mg total) by mouth at bedtime as needed for muscle spasms.    Dispense:  30 tablet    Refill:  1     Follow-up: Return in about 6 months (around 04/05/2021) for bp/ifg.    Beatrice Lecher, MD

## 2020-10-04 ENCOUNTER — Ambulatory Visit (INDEPENDENT_AMBULATORY_CARE_PROVIDER_SITE_OTHER): Payer: Medicare Other | Admitting: Pharmacist

## 2020-10-04 VITALS — BP 136/60 | HR 77 | Resp 14 | Ht 62.0 in | Wt 212.0 lb

## 2020-10-04 DIAGNOSIS — I10 Essential (primary) hypertension: Secondary | ICD-10-CM | POA: Diagnosis not present

## 2020-10-04 MED ORDER — VALSARTAN 320 MG PO TABS: 320.0000 mg | ORAL_TABLET | Freq: Every day | ORAL | 1 refills | Status: DC

## 2020-10-04 NOTE — Patient Instructions (Addendum)
It was nice meeting you!  We would like your blood pressure to be less than 130/80  Continue your metoprolol '50mg'$  once daily Continue your furosemide '20mg'$  as needed We will increase your valsartan to '320mg'$  once a day  We will update your lab work in 1-2 weeks  Please try to reduce your salt intake  Work on increasing your exercise up to 30 minutes a day at least 5 days a week  Begin taking your blood pressure at home about 2 hours after your medications  Please call with any questions  Karren Cobble, PharmD, BCACP, CDCES, [place] [id number] [address] [place], [place] [address] Phone: [phone]; Fax: [phone] 10/04/2020 11:31 AM

## 2020-10-04 NOTE — Progress Notes (Signed)
Patient ID: Martha Perkins                 DOB: 01/31/1946                      MRN: [id number]     HPI: Martha Perkins is a 75 y.o. female referred by Dr. Oval Linsey to HTN clinic. PMH is significant for HTN, PAF, aortic atherosclerosis, Graves Disease, HLD, and obesity.  At last visit with Dr. Oval Linsey, dilitazem was discontinued and patient was started on valsartan.  Patient presents today in good spirits.  Is tolerating valsartan with no adverse effects. BMP showed no abnormalities.  Takes metoprolol in the morning and valsartan at night.  Takes furosemide as needed for lower extremity swelling.  Husband cooks food at home, does not like eating out.  Patient reports she is a "salt addict."  Even though her husband uses salt in his cooking, she will still add it afterwards.  Does not drink alcohol or tobacco.    Has worked on reducing sugars and no longer drinks sodas.  Is not physically active.  Had shoulder surgery 2 months ago.  Has a blood pressure cuff at home but is not using.  Current HTN meds: valsartan '160mg'$  daily, Toprol '50mg'$  daily, furosemide '20mg'$  prn Previously tried: diltiazem BP goal: <130/80  Social History: no smoking, no alcohol  Wt Readings from Last 3 Encounters:  10/04/20 212 lb (96.2 kg)  10/03/20 211 lb (95.7 kg)  08/30/20 209 lb 12.8 oz (95.2 kg)   BP Readings from Last 3 Encounters:  10/03/20 (!) 137/58  08/30/20 140/68  07/19/20 139/72   Pulse Readings from Last 3 Encounters:  10/03/20 74  08/30/20 74  07/19/20 66    Renal function: CrCl cannot be calculated (Patient's most recent lab result is older than the maximum 21 days allowed.).  Past Medical History:  Diagnosis Date   Arthritis    neck, back, hips, knees, ankles   Asthma    use everyday   Cancer ([place]) XX123456   skin   Complication of anesthesia    Dyspnea    Dysrhythmia    PVCs , Afib   Heart murmur    History of kidney stones    Hyperlipidemia    Hypertension    Hyperthyroidism  08/08/2017   radio active iodine x2   Hypothyroidism    now take synthroid   Paroxysmal atrial fibrillation ([place]) 08/09/2015   PONV (postoperative nausea and vomiting)    Thyroid disease    Tuberculosis     Current Outpatient Medications on File Prior to Visit  Medication Sig Dispense Refill   acetaminophen (TYLENOL) 500 MG tablet Take 500 mg by mouth every 6 (six) hours as needed for moderate pain or headache.     albuterol (VENTOLIN HFA) 108 (90 Base) MCG/ACT inhaler INHALE TWO PUFFS INTO THE LUNGS EVERY 6 HOURS AS NEEDED FOR WHEEZING OR SHORTNESS OF BREATH (Patient taking differently: Inhale 2 puffs into the lungs every 6 (six) hours as needed for shortness of breath or wheezing.) 8.5 g 2   atorvastatin (LIPITOR) 40 MG tablet TAKE 1 TABLET BY MOUTH  DAILY (Patient taking differently: Take 40 mg by mouth daily.) 90 tablet 3   Biotin 1000 MCG tablet Take 1,000 mcg by mouth daily.     Cholecalciferol (VITAMIN D) 50 MCG (2000 UT) tablet Take 4,000 Units by mouth daily.     clotrimazole-betamethasone (LOTRISONE) cream Apply 1 application topically 2 (two)  times daily. (Patient taking differently: Apply 1 application topically 2 (two) times daily as needed (fungus).) 45 g 2   Coenzyme Q10 (COQ-10) 100 MG CAPS Take 100 mg by mouth daily.     cyclobenzaprine (FLEXERIL) 5 MG tablet Take 1 tablet (5 mg total) by mouth at bedtime as needed for muscle spasms. 30 tablet 1   fluticasone (FLONASE) 50 MCG/ACT nasal spray USE 1 SPRAY IN BOTH  NOSTRILS TWICE DAILY (Patient taking differently: Place 1 spray into both nostrils 2 (two) times daily.) 48 g 3   Fluticasone Furoate (ARNUITY ELLIPTA) 100 MCG/ACT AEPB Inhale 1 Inhaler into the lungs daily. 30 each 2   furosemide (LASIX) 20 MG tablet Take 3 tablets (60 mg total) by mouth daily. 90 tablet 3   ketoconazole (NIZORAL) 2 % cream Apply topically daily as needed for irritation. (Patient taking differently: Apply 1 application topically daily as needed for  irritation.) 60 g 3   levothyroxine (SYNTHROID) 150 MCG tablet TAKE 1 TABLET BY MOUTH  DAILY BEFORE BREAKFAST TAKE EXTRA ONE-HALF TABLET BY  MOUTH 1 DAY WEEK 96 tablet 3   loratadine (CLARITIN) 10 MG tablet Take 10 mg by mouth at bedtime.     Magnesium Citrate 200 MG TABS Take 400 mg by mouth daily.     meloxicam (MOBIC) 15 MG tablet Take 1 tablet (15 mg total) by mouth daily. 30 tablet 1   metoprolol succinate (TOPROL-XL) 50 MG 24 hr tablet TAKE 1 TABLET BY MOUTH  DAILY WITH OR IMMEDIATELY  FOLLOWING A MEAL 90 tablet 3   Multiple Vitamins-Minerals (ZINC PO) Take 1 tablet by mouth daily.     potassium chloride (KLOR-CON) 10 MEQ tablet TAKE 1 TABLET BY MOUTH  DAILY AS NEEDED 90 tablet 3   predniSONE (DELTASONE) 20 MG tablet Take 2 tablets (40 mg total) by mouth daily with breakfast. 10 tablet 0   traMADol (ULTRAM) 50 MG tablet TAKE ONE TABLET BY MOUTH THREE TIMES DAILY AS NEEDED 90 tablet 0   TURMERIC PO Take 1,500 mg by mouth in the morning and at bedtime.     valsartan (DIOVAN) 160 MG tablet Take 1 tablet (160 mg total) by mouth daily. 90 tablet 3   vitamin B-12 (CYANOCOBALAMIN) 1000 MCG tablet Take 1,000 mcg by mouth daily.     vitamin C (ASCORBIC ACID) 500 MG tablet Take 500 mg by mouth 2 (two) times daily.     Zinc 50 MG CAPS Take 50 mg by mouth daily.     No current facility-administered medications on file prior to visit.    Allergies  Allergen Reactions   Demerol Nausea And Vomiting     Assessment/Plan:  1. Hypertension -  Patient BP in room 136/60 which is above goal of <130/80 although improved since last visit.  Encouraged patient to reduce salt intake.  Encouraged using salt free seasonings such as Mrs Deliah Boston.  Recommended she begin an exercise regimen slowly with ultimate goal of physical activity of at least 30 minutes at least 5 days a week.  Sinc patient needs further BP lowering at this time will increase valsartan to '320mg'$  daily and encouraged patient to begin checking  BP at home about 2 hours after her medications.  Patient to call clinic if readings consistently above goal or if she experiences hypotension.  Repeat BMP scheduled for 1-2 weeks in [place].  Continue metoprolol '50mg'$  BID Continue furosemide '20mg'$  as needed Increase valsatan to '320mg'$  daily Check BMP in 1-2 weeks Recheck in clinic as  needed  Karren Cobble, PharmD, BCACP, [place], [place] [id number] [address], [place],  [address] Phone: [phone]; Fax: [phone] 10/04/2020 12:06 PM

## 2020-10-05 ENCOUNTER — Encounter: Payer: Self-pay | Admitting: Physical Therapy

## 2020-10-05 ENCOUNTER — Ambulatory Visit: Payer: Medicare Other | Admitting: Physical Therapy

## 2020-10-05 ENCOUNTER — Other Ambulatory Visit: Payer: Self-pay

## 2020-10-05 DIAGNOSIS — M25512 Pain in left shoulder: Secondary | ICD-10-CM | POA: Diagnosis not present

## 2020-10-05 DIAGNOSIS — G8929 Other chronic pain: Secondary | ICD-10-CM | POA: Diagnosis not present

## 2020-10-05 DIAGNOSIS — R600 Localized edema: Secondary | ICD-10-CM | POA: Diagnosis not present

## 2020-10-05 DIAGNOSIS — M25612 Stiffness of left shoulder, not elsewhere classified: Secondary | ICD-10-CM | POA: Diagnosis not present

## 2020-10-05 NOTE — Therapy (Signed)
[facility] [place], [place], [address] Phone: [phone]   Fax:  [phone]  Physical Therapy Treatment  Patient Details  Name: Martha Perkins MRN: [id number] Date of Birth: 1946/01/04 Referring Provider (PT): Roselee Culver PA-C   Encounter Date: 10/05/2020   PT End of Session - 10/05/20 1033     Visit Number 15    Number of Visits 16    Date for PT Re-Evaluation 10/10/20    Authorization Type FOTO AT LEAST EVERY 5TH VISIT.  PROGRESS NOTE AT 10TH VISIT.  KX MODIFIER AFTER 15 VISITS.    PT Start Time 1031    PT Stop Time 1117    PT Time Calculation (min) 46 min    Activity Tolerance Patient tolerated treatment well    Behavior During Therapy WFL for tasks assessed/performed             Past Medical History:  Diagnosis Date   Arthritis    neck, back, hips, knees, ankles   Asthma    use everyday   Cancer ([place]) [id number]   skin   Complication of anesthesia    Dyspnea    Dysrhythmia    PVCs , Afib   Heart murmur    History of kidney stones    Hyperlipidemia    Hypertension    Hyperthyroidism 08/08/2017   radio active iodine x2   Hypothyroidism    now take synthroid   Paroxysmal atrial fibrillation ([place]) 08/09/2015   PONV (postoperative nausea and vomiting)    Thyroid disease    Tuberculosis     Past Surgical History:  Procedure Laterality Date   CARDIOVERSION N/A 08/05/2016   Procedure: CARDIOVERSION;  Surgeon: Skeet Latch, MD;  Location: [facility];  Service: Cardiovascular;  Laterality: N/A;   CATARACT EXTRACTION Left 09/2012   Dr. Bunnie Philips   LITHOTRIPSY     REPLACEMENT TOTAL KNEE BILATERAL  2011   REVERSE SHOULDER ARTHROPLASTY Left 07/19/2020   Procedure: REVERSE SHOULDER ARTHROPLASTY;  Surgeon: Justice Britain, MD;  Location: [facility];  Service: Orthopedics;  Laterality: Left;  19mn   THYROIDECTOMY  1971   TUBAL LIGATION  1979    There were no vitals filed for this visit.   Subjective  Assessment - 10/05/20 1032     Subjective COVID-19 screen performed prior to patient entering clinic. Next MD appointment is 10/18/2020.    Pertinent History HTN, hypothyroidism, bilateral total knee replacements.    Patient Stated Goals Use left UE without pain.    Currently in Pain? No/denies                [facility] - 10/05/20 0001       Assessment   Medical Diagnosis Left total shoulder replacement.    Referring Provider (PT) TRoselee CulverPA-C    Onset Date/Surgical Date 07/19/20    Hand Dominance Right    Next MD Visit 10/18/2020      Precautions   Precaution Comments No ultrasound.  Progress per protocol.                           [facility] - 10/05/20 0001       Shoulder Exercises: Seated   Protraction AROM;Left;20 reps    External Rotation AROM;Both;20 reps    Flexion AROM;Left;20 reps    Abduction AROM;Left;20 reps    Other Seated Exercises AROM L shoulder scaption x20 reps    Other Seated  Exercises AROM L upper cut, short lever abductor x30 reps      Shoulder Exercises: Pulleys   Flexion 5 minutes      Modalities   Modalities Vasopneumatic      Vasopneumatic   Number Minutes Vasopneumatic  10 minutes    Vasopnuematic Location  Shoulder    Vasopneumatic Pressure Low    Vasopneumatic Temperature  34      Manual Therapy   Manual Therapy Passive ROM    Passive ROM PROM of L shoulder into flexion, ER, IR with light holds at end range                         PT Long Term Goals - 09/14/20 1241       PT LONG TERM GOAL #1   Title Independent with a HEP.    Time 4    Period Weeks    Status On-going      PT LONG TERM GOAL #2   Title Active left shoulder flexion to 135 degrees so the patient can easily reach overhead.    Baseline Passive 130 degrees (09/14/20).    Time 4    Period Weeks    Status On-going      PT LONG TERM GOAL #3   Title Active ER to 60 degrees+ to allow for easily  donning/doffing of apparel.    Baseline Passive 47    Time 4    Period Weeks    Status On-going      PT LONG TERM GOAL #4   Title Increase left shoulder strength to a solid 4/5 to increase stability for performance of functional activities.    Time 4    Period Weeks    Status On-going      PT LONG TERM GOAL #5   Time 4    Status On-going                   Plan - 10/05/20 1107     Clinical Impression Statement Patient presented in clinic with no current L shoulder pain. Patient to discharge next visit to allow time for R TSR in the next few months. Patient able to tolerate all AROM antigravity therex well but limited endurance and tolerance wise due to SOB and fatigue as heat has affected her asthma. Firm end feels and smooth arc of motion noted during PROM of L shoulder. Normal vasopneumatic response noted following removal of the modality.    Personal Factors and Comorbidities Other;Comorbidity 1;Comorbidity 2    Comorbidities HTN, hypothyroidism, bilateral total knee replacements.    Examination-Activity Limitations Other;Reach Overhead    Examination-Participation Restrictions Other    Stability/Clinical Decision Making Stable/Uncomplicated    Rehab Potential Excellent    PT Frequency 2x / week    PT Duration 4 weeks    PT Treatment/Interventions ADLs/Self Care Home Management;Cryotherapy;Electrical Stimulation;Moist Heat;Therapeutic activities;Therapeutic exercise;Manual techniques;Patient/family education;Passive range of motion;Vasopneumatic Device    PT Next Visit Plan D/C next treatment.    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   Aortic atherosclerosis ([place]) 12/26/2019   Palpitations 10/21/2019   Morbid obesity ([place]) 10/21/2019   Encounter for chronic pain management 06/30/2019   Hypothyroidism 09/11/2016   History of atrial fibrillation    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/14/2016   Restrictive lung disease 09/28/2015   Lumbar degenerative disc disease 08/15/2015   PAF (paroxysmal atrial fibrillation) ([place]) 08/09/2015   Chronic allergic rhinitis 05/17/2014   Cataract 06/03/2012   Hyperlipidemia LDL goal <100 12/21/2010   Essential hypertension 12/21/2010   GERD (gastroesophageal reflux disease) 07/18/2010   Insulin resistance 07/18/2010   Fatty liver 07/18/2010    Standley Brooking, PTA 10/05/2020, 12:18 PM  [facility] [address] [place], [place], [address] Phone: [phone]   Fax:  [phone]  Name: Martha Perkins MRN: [id number] Date of Birth: November 29, 1945

## 2020-10-12 ENCOUNTER — Ambulatory Visit: Payer: Medicare Other | Attending: Orthopedic Surgery | Admitting: Physical Therapy

## 2020-10-12 ENCOUNTER — Other Ambulatory Visit: Payer: Self-pay

## 2020-10-12 DIAGNOSIS — M25512 Pain in left shoulder: Secondary | ICD-10-CM | POA: Insufficient documentation

## 2020-10-12 DIAGNOSIS — M25612 Stiffness of left shoulder, not elsewhere classified: Secondary | ICD-10-CM | POA: Diagnosis not present

## 2020-10-12 DIAGNOSIS — G8929 Other chronic pain: Secondary | ICD-10-CM | POA: Insufficient documentation

## 2020-10-12 DIAGNOSIS — R600 Localized edema: Secondary | ICD-10-CM | POA: Insufficient documentation

## 2020-10-12 NOTE — Therapy (Deleted)
[facility] [place], [place], [address] Phone: [phone]   Fax:  [phone]  October 12, 2020   No Recipients  Physical Therapy Discharge Summary  Patient: Martha Perkins  MRN: [id number]  Date of Birth: 1945-10-03   Diagnosis: Chronic left shoulder pain  Stiffness of left shoulder, not elsewhere classified  Localized edema Referring Provider (PT): Roselee Culver PA-C   The above patient had been seen in Physical Therapy *** times of *** treatments scheduled with *** no shows and *** cancellations.  The treatment consisted of *** The patient is: {improved/worse/unchanged:[id number]}  Subjective: ***  Discharge Findings: ***  Functional Status at Discharge: ***  [id number]   Plan - 10/12/20 1100     Clinical Impression Statement Please see "Therapy Note" section.    Personal Factors and Comorbidities Other;Comorbidity 1;Comorbidity 2    Comorbidities HTN, hypothyroidism, bilateral total knee replacements.    Examination-Activity Limitations Other;Reach Overhead    Examination-Participation Restrictions Other    Stability/Clinical Decision Making Stable/Uncomplicated    Rehab Potential Excellent    PT Frequency 2x / week    PT Duration 4 weeks    PT Treatment/Interventions ADLs/Self Care Home Management;Cryotherapy;Electrical Stimulation;Moist Heat;Therapeutic activities;Therapeutic exercise;Manual techniques;Patient/family education;Passive range of motion;Vasopneumatic Device    PT Next Visit Plan D/c.    Consulted and Agree with Plan of Care Patient             Sincerely,   Giovannie Scerbo, [place], PT   CC No Recipients  [facility] [address] [place], [place], [address] Phone: [phone]   Fax:  [phone]  Patient: Martha Perkins  MRN: [id number]  Date of Birth: [birth year]

## 2020-10-12 NOTE — Therapy (Signed)
[facility] [place], [place], [address] Phone: [phone]   Fax:  [phone]  Physical Therapy Treatment  Patient Details  Name: Martha Perkins MRN: [id number] Date of Birth: 1945/07/07 Referring Provider (PT): Roselee Culver PA-C   Encounter Date: 10/12/2020   PT End of Session - 10/12/20 1033     Visit Number 16    Number of Visits 16    Date for PT Re-Evaluation 10/12/20    Authorization Type FOTO AT LEAST EVERY 5TH VISIT.  PROGRESS NOTE AT 10TH VISIT.  KX MODIFIER AFTER 15 VISITS.             Past Medical History:  Diagnosis Date   Arthritis    neck, back, hips, knees, ankles   Asthma    use everyday   Cancer ([place]) 9622   skin   Complication of anesthesia    Dyspnea    Dysrhythmia    PVCs , Afib   Heart murmur    History of kidney stones    Hyperlipidemia    Hypertension    Hyperthyroidism 08/08/2017   radio active iodine x2   Hypothyroidism    now take synthroid   Paroxysmal atrial fibrillation ([place]) 08/09/2015   PONV (postoperative nausea and vomiting)    Thyroid disease    Tuberculosis     Past Surgical History:  Procedure Laterality Date   CARDIOVERSION N/A 08/05/2016   Procedure: CARDIOVERSION;  Surgeon: Skeet Latch, MD;  Location: [facility];  Service: Cardiovascular;  Laterality: N/A;   CATARACT EXTRACTION Left 09/2012   Dr. Bunnie Philips   LITHOTRIPSY     REPLACEMENT TOTAL KNEE BILATERAL  2011   REVERSE SHOULDER ARTHROPLASTY Left 07/19/2020   Procedure: REVERSE SHOULDER ARTHROPLASTY;  Surgeon: Justice Britain, MD;  Location: [facility];  Service: Orthopedics;  Laterality: Left;  110mn   THYROIDECTOMY  1971   TUBAL LIGATION  1979    There were no vitals filed for this visit.   Subjective Assessment - 10/12/20 1057     Subjective COVID-19 screen performed prior to patient entering clinic.  Want to go easy today due to sciatica acting up.    Pertinent History HTN, hypothyroidism,  bilateral total knee replacements.    Patient Stated Goals Use left UE without pain.    Currently in Pain? Yes    Pain Score 2     Pain Location Shoulder    Pain Orientation Left    Pain Descriptors / Indicators Discomfort    Pain Onset 1 to 4 weeks ago                               [facility] 0001       Vasopneumatic   Number Minutes Vasopneumatic  15 minutes    Vasopnuematic Location  --   Left shoulder.   Vasopneumatic Pressure Low      Manual Therapy   Manual Therapy Passive ROM    Passive ROM Left shoulder passive range of motion x 12 minutes.                         PT Long Term Goals - 10/12/20 1035       PT LONG TERM GOAL #2   Baseline Supine active left should flexion is 135 degrees.    Time 4    Period Weeks    Status Partially  Met      PT LONG TERM GOAL #3   Title Active ER to 60 degrees+ to allow for easily donning/doffing of apparel.    Baseline In supine active ER is 57 degrees.    Time 4    Period Weeks    Status Partially Met      PT LONG TERM GOAL #4   Title Increase left shoulder strength to a solid 4/5 to increase stability for performance of functional activities.    Baseline 4/5    Time 4    Period Weeks    Status Achieved      PT LONG TERM GOAL #5   Title Perform ADL's with pain not > 3/10.                   Plan - 10/12/20 1100     Clinical Impression Statement Please see "Therapy Note" section.    Personal Factors and Comorbidities Other;Comorbidity 1;Comorbidity 2    Comorbidities HTN, hypothyroidism, bilateral total knee replacements.    Examination-Activity Limitations Other;Reach Overhead    Examination-Participation Restrictions Other    Stability/Clinical Decision Making Stable/Uncomplicated    Rehab Potential Excellent    PT Frequency 2x / week    PT Duration 4 weeks    PT Treatment/Interventions ADLs/Self Care Home  Management;Cryotherapy;Electrical Stimulation;Moist Heat;Therapeutic activities;Therapeutic exercise;Manual techniques;Patient/family education;Passive range of motion;Vasopneumatic Device    PT Next Visit Plan D/c.    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   Aortic atherosclerosis ([place]) 12/26/2019   Palpitations 10/21/2019   Morbid obesity ([place]) 10/21/2019   Encounter for chronic pain management 06/30/2019   Hypothyroidism 09/11/2016   History of atrial fibrillation    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/14/2016   Restrictive lung disease 09/28/2015   Lumbar degenerative disc disease 08/15/2015   PAF (paroxysmal atrial fibrillation) ([place]) 08/09/2015   Chronic allergic rhinitis 05/17/2014   Cataract 06/03/2012   Hyperlipidemia LDL goal <100 12/21/2010   Essential hypertension 12/21/2010   GERD (gastroesophageal reflux disease) 07/18/2010   Insulin resistance 07/18/2010   Fatty liver 07/18/2010    PHYSICAL THERAPY DISCHARGE SUMMARY  Visits from Start of Care: 16  Current functional level related to goals / functional outcomes: See above.   Remaining deficits: See goal section.     Education / Equipment: HEP.     Patient agrees to discharge. Patient goals were partially met. Patient is being discharged due to being pleased with the current functional level.     Jonthan Leite, [place] MPT 10/12/2020, 11:05 AM  [facility] [address] [place], [place], [address] Phone: [phone]   Fax:  [phone]  Name: Martha Perkins MRN: [id number] Date of Birth: September 10, 1945

## 2020-10-17 DIAGNOSIS — Z96612 Presence of left artificial shoulder joint: Secondary | ICD-10-CM | POA: Diagnosis not present

## 2020-10-17 DIAGNOSIS — I10 Essential (primary) hypertension: Secondary | ICD-10-CM | POA: Diagnosis not present

## 2020-10-17 LAB — BASIC METABOLIC PANEL
BUN/Creatinine Ratio: 21 (ref 12–28)
BUN: 15 mg/dL (ref 8–27)
CO2: 24 mmol/L (ref 20–29)
Calcium: 9.7 mg/dL (ref 8.7–10.3)
Chloride: 103 mmol/L (ref 96–106)
Creatinine, Ser: 0.71 mg/dL (ref 0.57–1.00)
Glucose: 121 mg/dL — ABNORMAL HIGH (ref 65–99)
Potassium: 4.3 mmol/L (ref 3.5–5.2)
Sodium: 144 mmol/L (ref 134–144)
eGFR: 90 mL/min/{1.73_m2} (ref >59)

## 2020-10-22 ENCOUNTER — Encounter: Payer: Self-pay | Admitting: Family Medicine

## 2020-10-22 ENCOUNTER — Other Ambulatory Visit: Payer: Self-pay | Admitting: Family Medicine

## 2020-10-22 ENCOUNTER — Other Ambulatory Visit: Payer: Self-pay

## 2020-10-22 ENCOUNTER — Ambulatory Visit (INDEPENDENT_AMBULATORY_CARE_PROVIDER_SITE_OTHER): Payer: Medicare Other | Admitting: Family Medicine

## 2020-10-22 VITALS — BP 128/77 | HR 92 | Temp 98.2°F | Wt 210.1 lb

## 2020-10-22 DIAGNOSIS — M5136 Other intervertebral disc degeneration, lumbar region: Secondary | ICD-10-CM

## 2020-10-22 DIAGNOSIS — G8929 Other chronic pain: Secondary | ICD-10-CM | POA: Diagnosis not present

## 2020-10-22 DIAGNOSIS — I10 Essential (primary) hypertension: Secondary | ICD-10-CM | POA: Diagnosis not present

## 2020-10-22 DIAGNOSIS — R300 Dysuria: Secondary | ICD-10-CM

## 2020-10-22 DIAGNOSIS — Z87442 Personal history of urinary calculi: Secondary | ICD-10-CM | POA: Diagnosis not present

## 2020-10-22 DIAGNOSIS — M51369 Other intervertebral disc degeneration, lumbar region without mention of lumbar back pain or lower extremity pain: Secondary | ICD-10-CM

## 2020-10-22 MED ORDER — TRAMADOL HCL 50 MG PO TABS: 50.0000 mg | ORAL_TABLET | Freq: Three times a day (TID) | ORAL | 0 refills | Status: DC | PRN

## 2020-10-22 NOTE — Patient Instructions (Signed)
For the next 10 to 14 days: Please cut your valsartan in half and take half a tab once a day. Increase metoprolol to 2 tabs daily.  These call back and let us know how you are feeling and see if your blood pressures are still well controlled and you are noticing fewer palpitations.   Not helpful then we will plan to switch your valsartan hand to a different ARB.

## 2020-10-22 NOTE — Progress Notes (Signed)
Acute Office Visit  Subjective:    Patient ID: Martha Perkins, female    DOB: 11-17-1945, 75 y.o.   MRN: [id number]  Chief Complaint  Patient presents with   Urinary Tract Infection    HPI Patient is in today for urinary sxs.  She says about 2 weeks ago she had some gross hematuria just lasted for about a day and then resolved on its own.  Then last week she noted some irritating voiding symptoms on and off but was not persistent until about 2 days ago where it became more uncomfortable.  She does have a history of kidney stones and so was not sure if that is what was causing it or if she might have a UTI no fever or chills.  He also wanted to discuss the valsartan hand she says ever since she was started on it a couple months ago she just has not felt well she has been experiencing more palpitations she was on Cardizem and that was discontinued and she was switched to valsartan and then her dose was increased after it did not completely control her blood pressure.  She is also felt a little bit more dizzy on and off on the medication.  Past Medical History:  Diagnosis Date   Arthritis    neck, back, hips, knees, ankles   Asthma    use everyday   Cancer ([place]) 1443   skin   Complication of anesthesia    Dyspnea    Dysrhythmia    PVCs , Afib   Heart murmur    History of kidney stones    Hyperlipidemia    Hypertension    Hyperthyroidism 08/08/2017   radio active iodine x2   Hypothyroidism    now take synthroid   Paroxysmal atrial fibrillation ([place]) 08/09/2015   PONV (postoperative nausea and vomiting)    Thyroid disease    Tuberculosis     Past Surgical History:  Procedure Laterality Date   CARDIOVERSION N/A 08/05/2016   Procedure: CARDIOVERSION;  Surgeon: Skeet Latch, MD;  Location: [facility];  Service: Cardiovascular;  Laterality: N/A;   CATARACT EXTRACTION Left 09/2012   Dr. Bunnie Philips   LITHOTRIPSY     REPLACEMENT TOTAL KNEE BILATERAL  2011   REVERSE  SHOULDER ARTHROPLASTY Left 07/19/2020   Procedure: REVERSE SHOULDER ARTHROPLASTY;  Surgeon: Justice Britain, MD;  Location: [facility];  Service: Orthopedics;  Laterality: Left;  132mn   THYROIDECTOMY  1971   TUBAL LIGATION  1979    Family History  Problem Relation Age of Onset   Coronary artery disease Mother 617      deceased   Depression Mother    Hyperlipidemia Mother    Diabetes Paternal Grandmother    Thyroid disease Sister    COPD Father    Breast cancer Neg Hx     Social History   Socioeconomic History   Marital status: Married    Spouse name: SRemo Lipps  Number of children: 2   Years of education: 16   Highest education level: Bachelor's degree (e.g., BA, AB, BS)  Occupational History   Occupation: Retired RTherapist, sports   Tobacco Use   Smoking status: Never   Smokeless tobacco: Never  Vaping Use   Vaping Use: Never used  Substance and Sexual Activity   Alcohol use: No   Drug use: No   Sexual activity: Not Currently  Other Topics Concern   Not on file  Social History Narrative   Stay around the house.  Go to grocery store.    Social Determinants of Health   Financial Resource Strain: Not on file  Food Insecurity: Not on file  Transportation Needs: Not on file  Physical Activity: Not on file  Stress: Not on file  Social Connections: Not on file  Intimate Partner Violence: Not on file    Outpatient Medications Prior to Visit  Medication Sig Dispense Refill   acetaminophen (TYLENOL) 500 MG tablet Take 500 mg by mouth every 6 (six) hours as needed for moderate pain or headache.     albuterol (VENTOLIN HFA) 108 (90 Base) MCG/ACT inhaler INHALE TWO PUFFS INTO THE LUNGS EVERY 6 HOURS AS NEEDED FOR WHEEZING OR SHORTNESS OF BREATH (Patient taking differently: Inhale 2 puffs into the lungs every 6 (six) hours as needed for shortness of breath or wheezing.) 8.5 g 2   atorvastatin (LIPITOR) 40 MG tablet TAKE 1 TABLET BY MOUTH  DAILY (Patient taking differently: Take 40 mg by mouth  daily.) 90 tablet 3   Biotin 1000 MCG tablet Take 1,000 mcg by mouth daily.     Cholecalciferol (VITAMIN D) 50 MCG (2000 UT) tablet Take 4,000 Units by mouth daily.     clotrimazole-betamethasone (LOTRISONE) cream Apply 1 application topically 2 (two) times daily. (Patient taking differently: Apply 1 application topically 2 (two) times daily as needed (fungus).) 45 g 2   Coenzyme Q10 (COQ-10) 100 MG CAPS Take 100 mg by mouth daily.     cyclobenzaprine (FLEXERIL) 5 MG tablet Take 1 tablet (5 mg total) by mouth at bedtime as needed for muscle spasms. 30 tablet 1   fluticasone (FLONASE) 50 MCG/ACT nasal spray USE 1 SPRAY IN BOTH  NOSTRILS TWICE DAILY (Patient taking differently: Place 1 spray into both nostrils 2 (two) times daily.) 48 g 3   Fluticasone Furoate (ARNUITY ELLIPTA) 100 MCG/ACT AEPB Inhale 1 Inhaler into the lungs daily. 30 each 2   furosemide (LASIX) 20 MG tablet Take 3 tablets (60 mg total) by mouth daily. 90 tablet 3   ketoconazole (NIZORAL) 2 % cream Apply topically daily as needed for irritation. (Patient taking differently: Apply 1 application topically daily as needed for irritation.) 60 g 3   levothyroxine (SYNTHROID) 150 MCG tablet TAKE 1 TABLET BY MOUTH  DAILY BEFORE BREAKFAST TAKE EXTRA ONE-HALF TABLET BY  MOUTH 1 DAY WEEK 96 tablet 3   loratadine (CLARITIN) 10 MG tablet Take 10 mg by mouth at bedtime.     Magnesium Citrate 200 MG TABS Take 400 mg by mouth daily.     meloxicam (MOBIC) 15 MG tablet Take 1 tablet (15 mg total) by mouth daily. 30 tablet 1   metoprolol succinate (TOPROL-XL) 50 MG 24 hr tablet TAKE 1 TABLET BY MOUTH  DAILY WITH OR IMMEDIATELY  FOLLOWING A MEAL 90 tablet 3   Multiple Vitamins-Minerals (ZINC PO) Take 1 tablet by mouth daily.     potassium chloride (KLOR-CON) 10 MEQ tablet TAKE 1 TABLET BY MOUTH  DAILY AS NEEDED 90 tablet 3   predniSONE (DELTASONE) 20 MG tablet Take 2 tablets (40 mg total) by mouth daily with breakfast. 10 tablet 0   TURMERIC PO Take  1,500 mg by mouth in the morning and at bedtime.     valsartan (DIOVAN) 320 MG tablet Take 1 tablet (320 mg total) by mouth daily. 90 tablet 1   vitamin B-12 (CYANOCOBALAMIN) 1000 MCG tablet Take 1,000 mcg by mouth daily.     vitamin C (ASCORBIC ACID) 500 MG tablet Take 500 mg  by mouth 2 (two) times daily.     Zinc 50 MG CAPS Take 50 mg by mouth daily.     traMADol (ULTRAM) 50 MG tablet TAKE ONE TABLET BY MOUTH THREE TIMES DAILY AS NEEDED 90 tablet 0   No facility-administered medications prior to visit.    Allergies  Allergen Reactions   Meperidine And Related     Feels loopy   Oxycodone     loopy   Percocet [Oxycodone-Acetaminophen]     Feels loopy   Demerol Nausea And Vomiting    Review of Systems     Objective:    Physical Exam Constitutional:      Appearance: She is well-developed.  HENT:     Head: Normocephalic and atraumatic.  Cardiovascular:     Rate and Rhythm: Normal rate and regular rhythm.     Heart sounds: Normal heart sounds.  Pulmonary:     Effort: Pulmonary effort is normal.     Breath sounds: Normal breath sounds.  Skin:    General: Skin is warm and dry.  Neurological:     Mental Status: She is alert and oriented to person, place, and time.  Psychiatric:        Behavior: Behavior normal.    BP 128/77 (BP Location: Right Arm, Patient Position: Sitting, Cuff Size: Large)   Pulse 92   Temp 98.2 F (36.8 C)   Wt 210 lb 1.3 oz (95.3 kg)   SpO2 98%   BMI 38.42 kg/m  Wt Readings from Last 3 Encounters:  10/22/20 210 lb 1.3 oz (95.3 kg)  10/04/20 212 lb (96.2 kg)  10/03/20 211 lb (95.7 kg)    Health Maintenance Due  Topic Date Due   COVID-19 Vaccine (4 - Booster for Moderna series) 04/25/2020   INFLUENZA VACCINE  10/08/2020    There are no preventive care reminders to display for this patient.   Lab Results  Component Value Date   TSH 0.46 07/10/2020   Lab Results  Component Value Date   WBC 5.5 07/11/2020   HGB 13.7 07/11/2020    HCT 41.2 07/11/2020   MCV 93.6 07/11/2020   PLT 206 07/11/2020   Lab Results  Component Value Date   NA 144 10/17/2020   K 4.3 10/17/2020   CO2 24 10/17/2020   GLUCOSE 121 (H) 10/17/2020   BUN 15 10/17/2020   CREATININE 0.71 10/17/2020   BILITOT 0.9 07/10/2020   ALKPHOS 119 08/25/2016   AST 26 07/10/2020   ALT 38 (H) 07/10/2020   PROT 6.8 07/10/2020   ALBUMIN 4.4 08/25/2016   CALCIUM 9.7 10/17/2020   EGFR 90 10/17/2020   Lab Results  Component Value Date   CHOL 192 07/10/2020   Lab Results  Component Value Date   HDL 46 (L) 07/10/2020   Lab Results  Component Value Date   LDLCALC 117 (H) 07/10/2020   Lab Results  Component Value Date   TRIG 171 (H) 07/10/2020   Lab Results  Component Value Date   CHOLHDL 4.2 07/10/2020   Lab Results  Component Value Date   HGBA1C 5.5 10/03/2020       Assessment & Plan:   Problem List Items Addressed This Visit       Cardiovascular and Mediastinum   Essential hypertension    For the next 10 to 14 days: Please cut your valsartan in half and take half a tab once a day. Increase metoprolol to 2 tabs daily.  These call back and let us know  how you are feeling and see if your blood pressures are still well controlled and you are noticing fewer palpitations.   Not helpful then we will plan to switch your valsartan hand to a different ARB.        Musculoskeletal and Integument   Lumbar degenerative disc disease   Relevant Medications   traMADol (ULTRAM) 50 MG tablet     Other   Encounter for chronic pain management   Relevant Medications   traMADol (ULTRAM) 50 MG tablet   Other Visit Diagnoses     Dysuria    -  Primary   Relevant Orders   Urine Culture   History of kidney stones          Dysuria-unclear etiology though her UA is only positive for leukocytes so I strongly suspect it is a kidney stone versus UTI but will send the urine for culture especially since she had 2 episodes of gross hematuria.  That  happens again then please let me know that the dipstick today was negative for hematuria.  Phonic pain management-did go ahead and refill her tramadol today.  Meds ordered this encounter  Medications   traMADol (ULTRAM) 50 MG tablet    Sig: Take 1 tablet (50 mg total) by mouth 3 (three) times daily as needed.    Dispense:  90 tablet    Refill:  0      Beatrice Lecher, MD

## 2020-10-22 NOTE — Assessment & Plan Note (Signed)
For the next 10 to 14 days: Please cut your valsartan in half and take half a tab once a day. Increase metoprolol to 2 tabs daily.  These call back and let us know how you are feeling and see if your blood pressures are still well controlled and you are noticing fewer palpitations.   Not helpful then we will plan to switch your valsartan hand to a different ARB.

## 2020-10-24 ENCOUNTER — Other Ambulatory Visit: Payer: Self-pay

## 2020-10-24 ENCOUNTER — Ambulatory Visit: Payer: Medicare Other | Admitting: Physician Assistant

## 2020-10-24 DIAGNOSIS — H938X2 Other specified disorders of left ear: Secondary | ICD-10-CM

## 2020-10-24 LAB — URINE CULTURE
MICRO NUMBER:: 12246268
SPECIMEN QUALITY:: ADEQUATE

## 2020-11-05 ENCOUNTER — Other Ambulatory Visit: Payer: Self-pay | Admitting: Family Medicine

## 2020-11-14 ENCOUNTER — Other Ambulatory Visit: Payer: Self-pay | Admitting: Family Medicine

## 2020-11-16 ENCOUNTER — Telehealth: Payer: Self-pay | Admitting: Family Medicine

## 2020-11-16 NOTE — Telephone Encounter (Signed)
Left message for patient to call back and schedule Medicare Annual Wellness Visit (AWV) on  Saturday Sept 10,2022 to do by phone or video 8-12  If unable to do Saturday, please get scheduled virtually/telephone on day during the week  Last AWV  04/19/2019 Please schedule at any time with [facility] Health Advisor.      40 Minutes appointment   Any questions, please call me at [phone]

## 2020-11-20 DIAGNOSIS — H90A32 Mixed conductive and sensorineural hearing loss, unilateral, left ear with restricted hearing on the contralateral side: Secondary | ICD-10-CM | POA: Diagnosis not present

## 2020-11-20 DIAGNOSIS — H6592 Unspecified nonsuppurative otitis media, left ear: Secondary | ICD-10-CM | POA: Diagnosis not present

## 2020-11-20 NOTE — Patient Instructions (Signed)
DUE TO COVID-19 ONLY ONE VISITOR IS ALLOWED TO COME WITH YOU AND STAY IN THE WAITING ROOM ONLY DURING PRE OP AND PROCEDURE DAY OF SURGERY IF YOU ARE GOING HOME AFTER SURGERY. IF YOU ARE SPENDING THE NIGHT 2 PEOPLE MAY VISIT WITH YOU IN YOUR PRIVATE ROOM AFTER SURGERY UNTIL VISITING  HOURS ARE OVER AT 800 PM AND THE 2 VISITORS CANNOT SPEND THE NIGHT.                 Martha Perkins     Your procedure is scheduled on: 12/06/20   Report to [facility] Main  Entrance   Report to short stay at 5:15 AM     Call this number if you have problems the morning of surgery [phone]    No food after midnight.    You may have clear liquid until 4:30 AM.    At 4:00 AM drink pre surgery drink.   Nothing by mouth after 4:30 AM.   CLEAR LIQUID DIET   Foods Allowed                                                                     Foods Excluded                                                                                                NO MILK OR CREAMER IN COFFEE Coffee and tea, regular and decaf                             liquids that you cannot  Plain Jell-O any favor except red or purple                                           see through such as: Fruit ices (not with fruit pulp)                                     milk, soups, orange juice  Iced Popsicles                                    All solid food Carbonated beverages, regular and diet                                    Cranberry, grape and apple juices Sports drinks like Gatorade Lightly seasoned clear broth or consume(fat free) Sugar     BRUSH YOUR TEETH MORNING OF SURGERY AND RINSE YOUR MOUTH  OUT, NO CHEWING GUM CANDY OR MINTS.     Take these medicines the morning of surgery with A SIP OF WATER: Levothyroxine, Metoprolol, Prednisone. Use your inhaler and bring it with you                                You may not have any metal on your body including hair pins and              piercings  Do not wear  jewelry, make-up, lotions, powders or perfumes, deodorant             Do not wear nail polish on your fingernails.  Do not shave  48 hours prior to surgery.              Men may shave face and neck.   Do not bring valuables to the hospital. [place].  Contacts, dentures or bridgework may not be worn into surgery.       Patients discharged the day of surgery will not be allowed to drive home.  IF YOU ARE HAVING SURGERY AND GOING HOME THE SAME DAY, YOU MUST HAVE AN ADULT TO DRIVE YOU HOME AND BE WITH YOU FOR 24 HOURS.  YOU MAY GO HOME BY TAXI OR UBER OR ORTHERWISE, BUT AN ADULT MUST ACCOMPANY YOU HOME AND STAY WITH YOU FOR 24 HOURS.  Name and phone number of your driver:  Special Instructions: N/A              Please read over the following fact sheets you were given: _____________________________________________________________________  [facility]- Preparing for Total Shoulder Arthroplasty    Before surgery, you can play an important role. Because skin is not sterile, your skin needs to be as free of germs as possible. You can reduce the number of germs on your skin by using the following products. Benzoyl Peroxide Gel Reduces the number of germs present on the skin Applied twice a day to shoulder area starting two days before surgery    ==================================================================  Please follow these instructions carefully:  BENZOYL PEROXIDE 5% GEL  Please do not use if you have an allergy to benzoyl peroxide.   If your skin becomes reddened/irritated stop using the benzoyl peroxide.  Starting two days before surgery, apply as follows: Apply benzoyl peroxide in the morning and at night. Apply after taking a shower. If you are not taking a shower clean entire shoulder front, back, and side along with the armpit with a clean wet washcloth.  Place a quarter-sized dollop on your shoulder and rub in thoroughly,  making sure to cover the front, back, and side of your shoulder, along with the armpit.   2 days before ____ AM   ____ PM              1 day before ____ AM   ____ PM                         Do this twice a day for two days.  (Last application is the night before surgery, AFTER using the CHG soap as described below).  Do NOT apply benzoyl peroxide gel on the day of surgery.              - Preparing  for Surgery Before surgery, you can play an important role.  Because skin is not sterile, your skin needs to be as free of germs as possible.  You can reduce the number of germs on your skin by washing with CHG (chlorahexidine gluconate) soap before surgery.  CHG is an antiseptic cleaner which kills germs and bonds with the skin to continue killing germs even after washing. Please DO NOT use if you have an allergy to CHG or antibacterial soaps.  If your skin becomes reddened/irritated stop using the CHG and inform your nurse when you arrive at Short Stay. Do not shave (including legs and underarms) for at least 48 hours prior to the first CHG shower.   Please follow these instructions carefully:  1.  Shower with CHG Soap the night before surgery and the  morning of Surgery.  2.  If you choose to wash your hair, wash your hair first as usual with your  normal  shampoo.  3.  After you shampoo, rinse your hair and body thoroughly to remove the  shampoo.                            4.  Use CHG as you would any other liquid soap.  You can apply chg directly  to the skin and wash                       Gently with a scrungie or clean washcloth.  5.  Apply the CHG Soap to your body ONLY FROM THE NECK DOWN.   Do not use on face/ open                           Wound or open sores. Avoid contact with eyes, ears mouth and genitals (private parts).                       Wash face,  Genitals (private parts) with your normal soap.             6.  Wash thoroughly, paying special attention to the area where  your surgery  will be performed.  7.  Thoroughly rinse your body with warm water from the neck down.  8.  DO NOT shower/wash with your normal soap after using and rinsing off  the CHG Soap.                9.  Pat yourself dry with a clean towel.            10.  Wear clean pajamas.            11.  Place clean sheets on your bed the night of your first shower and do not  sleep with pets. Day of Surgery : Do not apply any lotions/deodorants the morning of surgery.  Please wear clean clothes to the hospital/surgery center.  FAILURE TO FOLLOW THESE INSTRUCTIONS MAY RESULT IN THE CANCELLATION OF YOUR SURGERY PATIENT SIGNATURE_________________________________  NURSE SIGNATURE__________________________________  ________________________________________________________________________   Adam Phenix  An incentive spirometer is a tool that can help keep your lungs clear and active. This tool measures how well you are filling your lungs with each breath. Taking long deep breaths may help reverse or decrease the chance of developing breathing (pulmonary) problems (especially infection) following: A long period of time when you are unable to move or be  active. BEFORE THE PROCEDURE  If the spirometer includes an indicator to show your best effort, your nurse or respiratory therapist will set it to a desired goal. If possible, sit up straight or lean slightly forward. Try not to slouch. Hold the incentive spirometer in an upright position. INSTRUCTIONS FOR USE  Sit on the edge of your bed if possible, or sit up as far as you can in bed or on a chair. Hold the incentive spirometer in an upright position. Breathe out normally. Place the mouthpiece in your mouth and seal your lips tightly around it. Breathe in slowly and as deeply as possible, raising the piston or the ball toward the top of the column. Hold your breath for 3-5 seconds or for as long as possible. Allow the piston or ball to fall  to the bottom of the column. Remove the mouthpiece from your mouth and breathe out normally. Rest for a few seconds and repeat Steps 1 through 7 at least 10 times every 1-2 hours when you are awake. Take your time and take a few normal breaths between deep breaths. The spirometer may include an indicator to show your best effort. Use the indicator as a goal to work toward during each repetition. After each set of 10 deep breaths, practice coughing to be sure your lungs are clear. If you have an incision (the cut made at the time of surgery), support your incision when coughing by placing a pillow or rolled up towels firmly against it. Once you are able to get out of bed, walk around indoors and cough well. You may stop using the incentive spirometer when instructed by your caregiver.  RISKS AND COMPLICATIONS Take your time so you do not get dizzy or light-headed. If you are in pain, you may need to take or ask for pain medication before doing incentive spirometry. It is harder to take a deep breath if you are having pain. AFTER USE Rest and breathe slowly and easily. It can be helpful to keep track of a log of your progress. Your caregiver can provide you with a simple table to help with this. If you are using the spirometer at home, follow these instructions: [place] IF:  You are having difficultly using the spirometer. You have trouble using the spirometer as often as instructed. Your pain medication is not giving enough relief while using the spirometer. You develop fever of 100.5 F (38.1 C) or higher. SEEK IMMEDIATE MEDICAL CARE IF:  You cough up bloody sputum that had not been present before. You develop fever of 102 F (38.9 C) or greater. You develop worsening pain at or near the incision site. MAKE SURE YOU:  Understand these instructions. Will watch your condition. Will get help right away if you are not doing well or get worse. Document Released: 07/07/2006 Document  Revised: 05/19/2011 Document Reviewed: 09/07/2006 [facility] Patient Information 2014 [place], [place].   ________________________________________________________________________

## 2020-11-21 ENCOUNTER — Other Ambulatory Visit: Payer: Self-pay

## 2020-11-21 ENCOUNTER — Inpatient Hospital Stay (HOSPITAL_COMMUNITY): Admission: RE | Admit: 2020-11-21 | Payer: Medicare Other | Source: Ambulatory Visit

## 2020-11-21 ENCOUNTER — Encounter (HOSPITAL_COMMUNITY): Payer: Self-pay

## 2020-11-21 ENCOUNTER — Encounter (HOSPITAL_COMMUNITY)
Admission: RE | Admit: 2020-11-21 | Discharge: 2020-11-21 | Disposition: A | Discharge status: Home / Self Care | Payer: Medicare Other | Source: Ambulatory Visit | Attending: Orthopedic Surgery | Admitting: Orthopedic Surgery

## 2020-11-21 DIAGNOSIS — Z01812 Encounter for preprocedural laboratory examination: Secondary | ICD-10-CM | POA: Diagnosis not present

## 2020-11-21 LAB — BASIC METABOLIC PANEL
Anion gap: 8 (ref 5–15)
BUN: 22 mg/dL (ref 8–23)
CO2: 25 mmol/L (ref 22–32)
Calcium: 9.6 mg/dL (ref 8.9–10.3)
Chloride: 106 mmol/L (ref 98–111)
Creatinine, Ser: 0.69 mg/dL (ref 0.44–1.00)
GFR, Estimated: >60 mL/min (ref >60)
Glucose, Bld: 92 mg/dL (ref 70–99)
Potassium: 4.3 mmol/L (ref 3.5–5.1)
Sodium: 139 mmol/L (ref 135–145)

## 2020-11-21 LAB — CBC
HCT: 41.2 % (ref 36.0–46.0)
Hemoglobin: 13.7 g/dL (ref 12.0–15.0)
MCH: 30.2 pg (ref 26.0–34.0)
MCHC: 33.3 g/dL (ref 30.0–36.0)
MCV: 90.9 fL (ref 80.0–100.0)
Platelets: 191 10*3/uL (ref 150–400)
RBC: 4.53 MIL/uL (ref 3.87–5.11)
RDW: 12.5 % (ref 11.5–15.5)
WBC: 6.4 10*3/uL (ref 4.0–10.5)
nRBC: 0 % (ref 0.0–0.2)

## 2020-11-21 LAB — SURGICAL PCR SCREEN
MRSA, PCR: NEGATIVE
Staphylococcus aureus: NEGATIVE

## 2020-11-21 NOTE — Progress Notes (Addendum)
COVID test  Completed: NA  PCP - Dr. Peterson Ao LOV 10/22/20 Cardiologist -Dr. Oval Linsey LOV 08/30/20  Chest x-ray - no EKG - 08/30/20-epic Stress Test - no ECHO - 12/30/18-epic Cardiac Cath - no Pacemaker/ICD device last checked:NA  Sleep Study - no CPAP -   Fasting Blood Sugar - NA Checks Blood Sugar _____ times a day  Blood Thinner Instructions:NA Aspirin Instructions: Last Dose:  Anesthesia review: yes  Patient denies shortness of breath, fever, cough and chest pain at PAT appointment Yes Pt can climb 1 flight of stairs, do housework and ADLs without SOB   Patient verbalized understanding of instructions that were given to them at the PAT appointment. Patient was also instructed that they will need to review over the PAT instructions again at home before surgery. Yes Pt had a tympanotomy of Lt ear on 11/21/20 in the office. Fluid was removed and her hearing is improving.

## 2020-11-27 ENCOUNTER — Ambulatory Visit (INDEPENDENT_AMBULATORY_CARE_PROVIDER_SITE_OTHER): Payer: Medicare Other | Admitting: Family Medicine

## 2020-11-27 ENCOUNTER — Other Ambulatory Visit: Payer: Self-pay

## 2020-11-27 ENCOUNTER — Encounter: Payer: Self-pay | Admitting: Family Medicine

## 2020-11-27 VITALS — BP 126/70 | HR 74 | Resp 17

## 2020-11-27 DIAGNOSIS — R002 Palpitations: Secondary | ICD-10-CM

## 2020-11-27 DIAGNOSIS — S30814A Abrasion of vagina and vulva, initial encounter: Secondary | ICD-10-CM | POA: Diagnosis not present

## 2020-11-27 DIAGNOSIS — I10 Essential (primary) hypertension: Secondary | ICD-10-CM

## 2020-11-27 MED ORDER — METOPROLOL SUCCINATE ER 50 MG PO TB24: 100.0000 mg | ORAL_TABLET | Freq: Every day | ORAL | 3 refills | Status: DC

## 2020-11-27 NOTE — Progress Notes (Signed)
Acute Office Visit  Subjective:    Patient ID: Martha Perkins, female    DOB: [birth year], 75 y.o.   MRN: [id number]  Chief Complaint  Patient presents with   Vaginal Itching    HPI Patient is in today for abrasion to labia.  Reports she passed some large kidney stones in August and at least one of them get caught in her labia after passing due to body habitus. States she had to use her fingers to get it out, but it did scrape against the labia and cause an abrasion. She reports the area feels "raw" and irritated. It burns when urine runs over the area - she has tried using a peri-bottle to rinse, but has trouble due to body habitus and not being able to reach the right angles. She has not had any vaginal itching, burning, discharge, dysuria, odor, fevers, inflammation, bleeding.     Past Medical History:  Diagnosis Date   Arthritis    neck, back, hips, knees, ankles   Asthma    use everyday   Cancer ([place]) 2019   skin   Dyspnea    Dysrhythmia    PVCs , Afib   Heart murmur    History of kidney stones    Hyperlipidemia    Hypertension    Hyperthyroidism 08/08/2017   radio active iodine x2   Hypothyroidism    now take synthroid   Paroxysmal atrial fibrillation ([place]) 08/09/2015   PONV (postoperative nausea and vomiting)     Past Surgical History:  Procedure Laterality Date   CARDIOVERSION N/A 08/05/2016   Procedure: CARDIOVERSION;  Surgeon: Skeet Latch, MD;  Location: [facility];  Service: Cardiovascular;  Laterality: N/A;   CATARACT EXTRACTION Left 09/2012   Dr. Bunnie Philips   LITHOTRIPSY     REPLACEMENT TOTAL KNEE BILATERAL  2011   REVERSE SHOULDER ARTHROPLASTY Left 07/19/2020   Procedure: REVERSE SHOULDER ARTHROPLASTY;  Surgeon: Justice Britain, MD;  Location: [facility];  Service: Orthopedics;  Laterality: Left;  159mn   THYROIDECTOMY  1971   TUBAL LIGATION  1979    Family History  Problem Relation Age of Onset   Coronary artery disease Mother 670       deceased   Depression Mother    Hyperlipidemia Mother    Diabetes Paternal Grandmother    Thyroid disease Sister    COPD Father    Breast cancer Neg Hx     Social History   Socioeconomic History   Marital status: Married    Spouse name: SRemo Lipps  Number of children: 2   Years of education: 16   Highest education level: Bachelor's degree (e.g., BA, AB, BS)  Occupational History   Occupation: Retired RTherapist, sports   Tobacco Use   Smoking status: Never   Smokeless tobacco: Never  Vaping Use   Vaping Use: Never used  Substance and Sexual Activity   Alcohol use: No   Drug use: No   Sexual activity: Not Currently  Other Topics Concern   Not on file  Social History Narrative   Stay around the house. Go to grocery store.    Social Determinants of Health   Financial Resource Strain: Not on file  Food Insecurity: Not on file  Transportation Needs: Not on file  Physical Activity: Not on file  Stress: Not on file  Social Connections: Not on file  Intimate Partner Violence: Not on file    Outpatient Medications Prior to Visit  Medication Sig Dispense Refill  acetaminophen (TYLENOL) 500 MG tablet Take 500 mg by mouth every 6 (six) hours as needed for moderate pain or headache.     albuterol (VENTOLIN HFA) 108 (90 Base) MCG/ACT inhaler INHALE TWO PUFFS INTO THE LUNGS EVERY 6 HOURS AS NEEDED FOR WHEEZING OR SHORTNESS OF BREATH (Patient taking differently: Inhale 2 puffs into the lungs every 6 (six) hours as needed for shortness of breath or wheezing.) 8.5 g 2   atorvastatin (LIPITOR) 40 MG tablet TAKE 1 TABLET BY MOUTH  DAILY (Patient taking differently: Take 40 mg by mouth at bedtime.) 90 tablet 3   Biotin 1000 MCG tablet Take 1,000 mcg by mouth daily.     Cholecalciferol (VITAMIN D) 125 MCG (5000 UT) CAPS Take 5,000 Units by mouth daily. With K2     clotrimazole-betamethasone (LOTRISONE) cream Apply 1 application topically 2 (two) times daily. (Patient taking differently: Apply 1  application topically 2 (two) times daily as needed (fungus).) 45 g 2   Coenzyme Q10 (COQ-10) 100 MG CAPS Take 100 mg by mouth daily.     cyclobenzaprine (FLEXERIL) 5 MG tablet Take 1 tablet (5 mg total) by mouth at bedtime as needed for muscle spasms. 30 tablet 1   fluticasone (FLONASE) 50 MCG/ACT nasal spray USE 1 SPRAY IN BOTH  NOSTRILS TWICE DAILY (Patient taking differently: Place 1 spray into both nostrils 2 (two) times daily.) 48 g 3   Fluticasone Furoate (ARNUITY ELLIPTA) 100 MCG/ACT AEPB Inhale 1 Inhaler into the lungs daily. 30 each 2   furosemide (LASIX) 20 MG tablet Take 3 tablets (60 mg total) by mouth daily. (Patient taking differently: Take 60 mg by mouth daily as needed for fluid.) 90 tablet 3   ketoconazole (NIZORAL) 2 % cream Apply topically daily as needed for irritation. (Patient taking differently: Apply 1 application topically daily as needed for irritation.) 60 g 3   levothyroxine (SYNTHROID) 150 MCG tablet TAKE 1 TABLET BY MOUTH  DAILY BEFORE BREAKFAST TAKE EXTRA ONE-HALF TABLET BY  MOUTH 1 DAY WEEK (Patient taking differently: Take 150-225 mcg by mouth daily before breakfast. TAKE 1 TABLET BY MOUTH  DAILY BEFORE BREAKFAST TAKE EXTRA ONE-HALF TABLET BY  MOUTH 1 DAY WEEK 225 mcg on Saturday and 150 mcg all other days) 96 tablet 3   loratadine (CLARITIN) 10 MG tablet Take 10 mg by mouth at bedtime.     Magnesium Citrate 200 MG TABS Take 200 mg by mouth daily.     meloxicam (MOBIC) 15 MG tablet TAKE 1 TABLET BY MOUTH  DAILY 90 tablet 3   metoprolol succinate (TOPROL-XL) 50 MG 24 hr tablet TAKE 1 TABLET BY MOUTH  DAILY WITH OR IMMEDIATELY  FOLLOWING A MEAL (Patient taking differently: Take 50 mg by mouth in the morning and at bedtime.) 90 tablet 3   potassium chloride (KLOR-CON) 10 MEQ tablet TAKE 1 TABLET BY MOUTH  DAILY AS NEEDED (Patient taking differently: Take 10 mEq by mouth daily as needed (Takes with Lasix).) 90 tablet 3   predniSONE (DELTASONE) 20 MG tablet Take 2 tablets  (40 mg total) by mouth daily with breakfast. (Patient not taking: No sig reported) 10 tablet 0   traMADol (ULTRAM) 50 MG tablet Take 1 tablet (50 mg total) by mouth 3 (three) times daily as needed. 90 tablet 0   TURMERIC PO Take 1,500 mg by mouth daily.     valsartan (DIOVAN) 160 MG tablet Take 160 mg by mouth every evening.     vitamin B-12 (CYANOCOBALAMIN) 1000 MCG tablet  Take 1,000 mcg by mouth daily.     vitamin C (ASCORBIC ACID) 500 MG tablet Take 500 mg by mouth 2 (two) times daily.     Zinc 50 MG CAPS Take 50 mg by mouth daily.     No facility-administered medications prior to visit.    Allergies  Allergen Reactions   Meperidine And Related Nausea And Vomiting   Oxycodone     loopy   Percocet [Oxycodone-Acetaminophen]     Feels loopy   Demerol Nausea And Vomiting    Review of Systems All review of systems negative except what is listed in the HPI     Objective:    Physical Exam Vitals reviewed.  Constitutional:      Appearance: Normal appearance. She is obese.  Genitourinary:    Comments: Superficial abrasion (about 5:00, bottom left labial fold) as seen in picture below, no edema, erythema, streaking, induration, warmth Skin:    General: Skin is warm and dry.     Findings: No erythema or rash.  Neurological:     Mental Status: She is alert and oriented to person, place, and time.  Psychiatric:        Mood and Affect: Mood normal.        Behavior: Behavior normal.        Thought Content: Thought content normal.        Judgment: Judgment normal.       There were no vitals taken for this visit. Wt Readings from Last 3 Encounters:  11/21/20 208 lb (94.3 kg)  10/22/20 210 lb 1.3 oz (95.3 kg)  10/04/20 212 lb (96.2 kg)    Health Maintenance Due  Topic Date Due   COVID-19 Vaccine (4 - Booster for Moderna series) 04/17/2020   INFLUENZA VACCINE  10/08/2020    There are no preventive care reminders to display for this patient.   Lab Results  Component  Value Date   TSH 0.46 07/10/2020   Lab Results  Component Value Date   WBC 6.4 11/21/2020   HGB 13.7 11/21/2020   HCT 41.2 11/21/2020   MCV 90.9 11/21/2020   PLT 191 11/21/2020   Lab Results  Component Value Date   NA 139 11/21/2020   K 4.3 11/21/2020   CO2 25 11/21/2020   GLUCOSE 92 11/21/2020   BUN 22 11/21/2020   CREATININE 0.69 11/21/2020   BILITOT 0.9 07/10/2020   ALKPHOS 119 08/25/2016   AST 26 07/10/2020   ALT 38 (H) 07/10/2020   PROT 6.8 07/10/2020   ALBUMIN 4.4 08/25/2016   CALCIUM 9.6 11/21/2020   ANIONGAP 8 11/21/2020   EGFR 90 10/17/2020   Lab Results  Component Value Date   CHOL 192 07/10/2020   Lab Results  Component Value Date   HDL 46 (L) 07/10/2020   Lab Results  Component Value Date   LDLCALC 117 (H) 07/10/2020   Lab Results  Component Value Date   TRIG 171 (H) 07/10/2020   Lab Results  Component Value Date   CHOLHDL 4.2 07/10/2020   Lab Results  Component Value Date   HGBA1C 5.5 10/03/2020       Assessment & Plan:   1. Abrasion of labia, initial encounter Try to keep the area clean and only pat dry, or rinse with  peri-bottle, instead of wiping.  When you are at home, try to go without underwear and let the area air out as much as possible No signs of infection today, so instead of trying antibiotics and  possibly causing a yeast infection, lets try a barrier cream or gentle ointment like Aquaphor or Eucerin  to help prevent further rubbing. Watch for signs of infection: swelling or abscess formation, redness, warmth, fevers, pus-like drainage   2. Palpitations 3. Essential hypertension Question about her BP meds - at last visit told to cut valsartan to 160 mg tab and take 2 metoprolol 30m tabs daily (to better control her palpitations). She was supposed to call back with BP readings and see if palpitations have improved - states that has helped significantly. She will continue monitoring BP. Adjusting medication list to reflect  change.    Patient aware of signs/symptoms requiring further/urgent evaluation.   Follow-up if symptoms worsen or fail to improve.   TPurcell NailsBOlevia Bowens DNP, FNP-C

## 2020-11-27 NOTE — Progress Notes (Signed)
Anesthesia Chart Review   Case: [id number] Date/Time: 12/06/20 0715   Procedure: REVERSE SHOULDER ARTHROPLASTY (Right: Shoulder) - 116min   Anesthesia type: General   Pre-op diagnosis: Right shoulder rotator cuff tear arthropathy   Location: [facility]OM 06 / [facility]   Surgeons: Justice Britain, MD       DISCUSSION:75 y.o. never smoker with h/o PONV, HTN, remote h/o PAFin the setting of Grave's disease and hyperparathyroidism, asthma, right shoulder rotator cuff tear scheduled for above procedure 12/06/2020 with Dr. Justice Britain.   Last seen by cardiology 08/30/2020, stable at this visit.  VS: BP (!) 153/67   Pulse (!) 56   Temp 36.6 C (Oral)   Resp 20   Ht 5\' 2"  (1.575 m)   Wt 94.3 kg   SpO2 98%   BMI 38.04 kg/m   PROVIDERS: Hali Marry, MD is PCP   Skeet Latch, MD is Cardiologist  LABS: Labs reviewed: Acceptable for surgery. (all labs ordered are listed, but only abnormal results are displayed)  Labs Reviewed  SURGICAL PCR SCREEN  CBC  BASIC METABOLIC PANEL     IMAGES:   EKG: 08/30/2020 Rate 74 bpm  NSR  CV: Echo 12/30/2018 1. Left ventricular ejection fraction, by visual estimation, is 60 to  65%. The left ventricle has normal function. Normal left ventricular size.  There is no left ventricular hypertrophy.   2. Left ventricular diastolic Doppler parameters are consistent with  impaired relaxation pattern of LV diastolic filling.   3. Global right ventricle has normal systolic function.The right  ventricular size is normal. No increase in right ventricular wall  thickness.   4. Left atrial size was normal.   5. Right atrial size was normal.   6. Mild to moderate mitral annular calcification.   7. The mitral valve is normal in structure. No evidence of mitral valve  regurgitation. No evidence of mitral stenosis.   8. The tricuspid valve is normal in structure. Tricuspid valve  regurgitation was not visualized by color flow Doppler.   9. The  aortic valve is tricuspid Aortic valve regurgitation was not  visualized by color flow Doppler. Mild aortic valve sclerosis without  stenosis.  10. There is Mild calcification of the aortic valve.  11. There is Mild thickening of the aortic valve.  12. The pulmonic valve was normal in structure. Pulmonic valve  regurgitation is not visualized by color flow Doppler.  13. The inferior vena cava is normal in size with greater than 50%  respiratory variability, suggesting right atrial pressure of 3 mmHg.  Past Medical History:  Diagnosis Date   Arthritis    neck, back, hips, knees, ankles   Asthma    use everyday   Cancer ([place]) 2019   skin   Dyspnea    Dysrhythmia    PVCs , Afib   Heart murmur    History of kidney stones    Hyperlipidemia    Hypertension    Hyperthyroidism 08/08/2017   radio active iodine x2   Hypothyroidism    now take synthroid   Paroxysmal atrial fibrillation ([place]) 08/09/2015   PONV (postoperative nausea and vomiting)     Past Surgical History:  Procedure Laterality Date   CARDIOVERSION N/A 08/05/2016   Procedure: CARDIOVERSION;  Surgeon: Skeet Latch, MD;  Location: [facility];  Service: Cardiovascular;  Laterality: N/A;   CATARACT EXTRACTION Left 09/2012   Dr. Bunnie Philips   LITHOTRIPSY     REPLACEMENT TOTAL KNEE BILATERAL  2011   REVERSE  SHOULDER ARTHROPLASTY Left 07/19/2020   Procedure: REVERSE SHOULDER ARTHROPLASTY;  Surgeon: Justice Britain, MD;  Location: [facility];  Service: Orthopedics;  Laterality: Left;  158min   THYROIDECTOMY  1971   TUBAL LIGATION  1979    MEDICATIONS:  acetaminophen (TYLENOL) 500 MG tablet   albuterol (VENTOLIN HFA) 108 (90 Base) MCG/ACT inhaler   atorvastatin (LIPITOR) 40 MG tablet   Biotin 1000 MCG tablet   Cholecalciferol (VITAMIN D) 125 MCG (5000 UT) CAPS   clotrimazole-betamethasone (LOTRISONE) cream   Coenzyme Q10 (COQ-10) 100 MG CAPS   cyclobenzaprine (FLEXERIL) 5 MG tablet   fluticasone (FLONASE) 50 MCG/ACT  nasal spray   Fluticasone Furoate (ARNUITY ELLIPTA) 100 MCG/ACT AEPB   furosemide (LASIX) 20 MG tablet   ketoconazole (NIZORAL) 2 % cream   levothyroxine (SYNTHROID) 150 MCG tablet   loratadine (CLARITIN) 10 MG tablet   Magnesium Citrate 200 MG TABS   meloxicam (MOBIC) 15 MG tablet   metoprolol succinate (TOPROL-XL) 50 MG 24 hr tablet   potassium chloride (KLOR-CON) 10 MEQ tablet   predniSONE (DELTASONE) 20 MG tablet   traMADol (ULTRAM) 50 MG tablet   TURMERIC PO   valsartan (DIOVAN) 160 MG tablet   vitamin B-12 (CYANOCOBALAMIN) 1000 MCG tablet   vitamin C (ASCORBIC ACID) 500 MG tablet   Zinc 50 MG CAPS   No current facility-administered medications for this encounter.   Konrad Felix, PA-C [facility] [phone]

## 2020-11-27 NOTE — Patient Instructions (Signed)
Try to keep the area clean and only pat dry, or rinse with  peri-bottle, instead of wiping.  When you are at home, try to go without underwear and let the area air out as much as possible No signs of infection today, so instead of trying antibiotics and possible causing a yeast infection, lets try a barrier cream or gentle ointment like Aquaphor or Eucerin  to help prevent further rubbing. Watch for signs of infection: swelling or abscess formation, redness, warmth, fevers, pus-like drainage

## 2020-11-29 ENCOUNTER — Other Ambulatory Visit: Payer: Self-pay | Admitting: Family Medicine

## 2020-11-29 DIAGNOSIS — M51369 Other intervertebral disc degeneration, lumbar region without mention of lumbar back pain or lower extremity pain: Secondary | ICD-10-CM

## 2020-11-29 DIAGNOSIS — G8929 Other chronic pain: Secondary | ICD-10-CM

## 2020-11-29 DIAGNOSIS — M5136 Other intervertebral disc degeneration, lumbar region: Secondary | ICD-10-CM

## 2020-12-05 ENCOUNTER — Encounter (HOSPITAL_COMMUNITY): Payer: Self-pay | Admitting: Orthopedic Surgery

## 2020-12-05 NOTE — Anesthesia Preprocedure Evaluation (Addendum)
Anesthesia Evaluation  Patient identified by MRN, date of birth, ID band Patient awake    Reviewed: Allergy & Precautions, NPO status , Patient's Chart, lab work & pertinent test results, reviewed documented beta blocker date and time   History of Anesthesia Complications (+) PONV and history of anesthetic complications  Airway Mallampati: III  TM Distance: >3 FB Neck ROM: Full    Dental no notable dental hx. (+) Teeth Intact, Dental Advisory Given, Caps   Pulmonary shortness of breath and with exertion, asthma ,    Pulmonary exam normal breath sounds clear to auscultation       Cardiovascular hypertension, Pt. on medications and Pt. on home beta blockers Normal cardiovascular exam+ dysrhythmias Atrial Fibrillation + Valvular Problems/Murmurs  Rhythm:Regular Rate:Normal  Echo 12/30/2018 1. Left ventricular ejection fraction, by visual estimation, is 60 to 65%. The left ventricle has normal function. Normal left ventricular size. There is no left ventricular hypertrophy.  2. Left ventricular diastolic Doppler parameters are consistent with impaired relaxation pattern of LV diastolic filling.  3. Global right ventricle has normal systolic function.The right ventricular size is normal. No increase in right ventricular wall thickness.  4. Left atrial size was normal.  5. Right atrial size was normal.  6. Mild to moderate mitral annular calcification.  7. The mitral valve is normal in structure. No evidence of mitral valve regurgitation. No evidence of mitral stenosis.  8. The tricuspid valve is normal in structure. Tricuspid valve regurgitation was not visualized by color flow Doppler.  9. The aortic valve is tricuspid Aortic valve regurgitation was not visualized by color flow Doppler. Mild aortic valve sclerosis without stenosis.  10. There is Mild calcification of the aortic valve.  11. There is Mild thickening of the aortic  valve.  12. The pulmonic valve was normal in structure. Pulmonic valve regurgitation is not visualized by color flow Doppler.  13. The inferior vena cava is normal in size with greater than 50% respiratory variability, suggesting right atrial pressure of 3 mmHg.   EKG 08/30/20 NSR, Normal   Neuro/Psych negative neurological ROS  negative psych ROS   GI/Hepatic Neg liver ROS, GERD  Controlled and Medicated,  Endo/Other  Hypothyroidism Hyperthyroidism Obesity S/P RAI therapy for hyperthyroidism  Renal/GU negative Renal ROSHx/o renal calculi  negative genitourinary   Musculoskeletal  (+) Arthritis , Osteoarthritis,  Right shoulder Rotator Cuff Arthropathy   Abdominal (+) + obese,   Peds  Hematology negative hematology ROS (+)   Anesthesia Other Findings   Reproductive/Obstetrics                           Anesthesia Physical Anesthesia Plan  ASA: 3  Anesthesia Plan: General   Post-op Pain Management:  Regional for Post-op pain   Induction: Intravenous  PONV Risk Score and Plan: 4 or greater and Treatment may vary due to age or medical condition, Ondansetron, Dexamethasone, Aprepitant and Diphenhydramine  Airway Management Planned: Oral ETT  Additional Equipment:   Intra-op Plan:   Post-operative Plan: Extubation in OR  Informed Consent: I have reviewed the patients History and Physical, chart, labs and discussed the procedure including the risks, benefits and alternatives for the proposed anesthesia with the patient or authorized representative who has indicated his/her understanding and acceptance.     Dental advisory given  Plan Discussed with: CRNA and Anesthesiologist  Anesthesia Plan Comments:        Anesthesia Quick Evaluation

## 2020-12-06 ENCOUNTER — Ambulatory Visit (HOSPITAL_COMMUNITY)
Admission: RE | Admit: 2020-12-06 | Discharge: 2020-12-06 | Disposition: A | Discharge status: Home / Self Care | Payer: Medicare Other | Source: Ambulatory Visit | Attending: Orthopedic Surgery | Admitting: Orthopedic Surgery

## 2020-12-06 ENCOUNTER — Encounter (HOSPITAL_COMMUNITY): Payer: Self-pay

## 2020-12-06 ENCOUNTER — Emergency Department (HOSPITAL_COMMUNITY): Payer: Medicare Other

## 2020-12-06 ENCOUNTER — Other Ambulatory Visit: Payer: Self-pay

## 2020-12-06 ENCOUNTER — Emergency Department (HOSPITAL_COMMUNITY)
Admission: EM | Admit: 2020-12-06 | Discharge: 2020-12-06 | Disposition: A | Discharge status: Home / Self Care | Payer: Medicare Other | Source: Home / Self Care | Attending: Emergency Medicine | Admitting: Emergency Medicine

## 2020-12-06 ENCOUNTER — Ambulatory Visit (HOSPITAL_COMMUNITY): Payer: Medicare Other | Admitting: Anesthesiology

## 2020-12-06 ENCOUNTER — Ambulatory Visit (HOSPITAL_COMMUNITY): Payer: Medicare Other | Admitting: Physician Assistant

## 2020-12-06 ENCOUNTER — Encounter (HOSPITAL_COMMUNITY)
Admission: RE | Disposition: A | Discharge status: Home / Self Care | Payer: Self-pay | Source: Ambulatory Visit | Attending: Orthopedic Surgery

## 2020-12-06 ENCOUNTER — Encounter (HOSPITAL_COMMUNITY): Payer: Self-pay | Admitting: Orthopedic Surgery

## 2020-12-06 DIAGNOSIS — Z79899 Other long term (current) drug therapy: Secondary | ICD-10-CM | POA: Insufficient documentation

## 2020-12-06 DIAGNOSIS — R0602 Shortness of breath: Secondary | ICD-10-CM | POA: Insufficient documentation

## 2020-12-06 DIAGNOSIS — M129 Arthropathy, unspecified: Secondary | ICD-10-CM | POA: Insufficient documentation

## 2020-12-06 DIAGNOSIS — I700 Atherosclerosis of aorta: Secondary | ICD-10-CM | POA: Insufficient documentation

## 2020-12-06 DIAGNOSIS — Z885 Allergy status to narcotic agent status: Secondary | ICD-10-CM | POA: Insufficient documentation

## 2020-12-06 DIAGNOSIS — M75101 Unspecified rotator cuff tear or rupture of right shoulder, not specified as traumatic: Secondary | ICD-10-CM | POA: Diagnosis not present

## 2020-12-06 DIAGNOSIS — M5136 Other intervertebral disc degeneration, lumbar region: Secondary | ICD-10-CM

## 2020-12-06 DIAGNOSIS — Z96653 Presence of artificial knee joint, bilateral: Secondary | ICD-10-CM | POA: Insufficient documentation

## 2020-12-06 DIAGNOSIS — I119 Hypertensive heart disease without heart failure: Secondary | ICD-10-CM | POA: Insufficient documentation

## 2020-12-06 DIAGNOSIS — G8918 Other acute postprocedural pain: Secondary | ICD-10-CM | POA: Diagnosis not present

## 2020-12-06 DIAGNOSIS — Z791 Long term (current) use of non-steroidal anti-inflammatories (NSAID): Secondary | ICD-10-CM | POA: Insufficient documentation

## 2020-12-06 DIAGNOSIS — Z85828 Personal history of other malignant neoplasm of skin: Secondary | ICD-10-CM | POA: Insufficient documentation

## 2020-12-06 DIAGNOSIS — M25711 Osteophyte, right shoulder: Secondary | ICD-10-CM | POA: Diagnosis not present

## 2020-12-06 DIAGNOSIS — Z96612 Presence of left artificial shoulder joint: Secondary | ICD-10-CM | POA: Insufficient documentation

## 2020-12-06 DIAGNOSIS — J45909 Unspecified asthma, uncomplicated: Secondary | ICD-10-CM | POA: Insufficient documentation

## 2020-12-06 DIAGNOSIS — E079 Disorder of thyroid, unspecified: Secondary | ICD-10-CM | POA: Insufficient documentation

## 2020-12-06 DIAGNOSIS — G8929 Other chronic pain: Secondary | ICD-10-CM

## 2020-12-06 DIAGNOSIS — I10 Essential (primary) hypertension: Secondary | ICD-10-CM | POA: Insufficient documentation

## 2020-12-06 DIAGNOSIS — M19011 Primary osteoarthritis, right shoulder: Secondary | ICD-10-CM | POA: Diagnosis not present

## 2020-12-06 DIAGNOSIS — J9811 Atelectasis: Secondary | ICD-10-CM | POA: Insufficient documentation

## 2020-12-06 DIAGNOSIS — E039 Hypothyroidism, unspecified: Secondary | ICD-10-CM | POA: Insufficient documentation

## 2020-12-06 DIAGNOSIS — I517 Cardiomegaly: Secondary | ICD-10-CM | POA: Diagnosis not present

## 2020-12-06 DIAGNOSIS — M19012 Primary osteoarthritis, left shoulder: Secondary | ICD-10-CM | POA: Diagnosis not present

## 2020-12-06 DIAGNOSIS — N200 Calculus of kidney: Secondary | ICD-10-CM | POA: Diagnosis not present

## 2020-12-06 HISTORY — PX: REVERSE SHOULDER ARTHROPLASTY: SHX5054

## 2020-12-06 LAB — CBC WITH DIFFERENTIAL/PLATELET
Abs Immature Granulocytes: 0.04 10*3/uL (ref 0.00–0.07)
Basophils Absolute: 0 10*3/uL (ref 0.0–0.1)
Basophils Relative: 0 %
Eosinophils Absolute: 0 10*3/uL (ref 0.0–0.5)
Eosinophils Relative: 0 %
HCT: 42.4 % (ref 36.0–46.0)
Hemoglobin: 13.9 g/dL (ref 12.0–15.0)
Immature Granulocytes: 0 %
Lymphocytes Relative: 9 %
Lymphs Abs: 1.1 10*3/uL (ref 0.7–4.0)
MCH: 29.9 pg (ref 26.0–34.0)
MCHC: 32.8 g/dL (ref 30.0–36.0)
MCV: 91.2 fL (ref 80.0–100.0)
Monocytes Absolute: 0.5 10*3/uL (ref 0.1–1.0)
Monocytes Relative: 4 %
Neutro Abs: 10.6 10*3/uL — ABNORMAL HIGH (ref 1.7–7.7)
Neutrophils Relative %: 87 %
Platelets: 217 10*3/uL (ref 150–400)
RBC: 4.65 MIL/uL (ref 3.87–5.11)
RDW: 12.7 % (ref 11.5–15.5)
WBC: 12.3 10*3/uL — ABNORMAL HIGH (ref 4.0–10.5)
nRBC: 0 % (ref 0.0–0.2)

## 2020-12-06 LAB — BASIC METABOLIC PANEL
Anion gap: 7 (ref 5–15)
BUN: 16 mg/dL (ref 8–23)
CO2: 26 mmol/L (ref 22–32)
Calcium: 9.8 mg/dL (ref 8.9–10.3)
Chloride: 112 mmol/L — ABNORMAL HIGH (ref 98–111)
Creatinine, Ser: 0.67 mg/dL (ref 0.44–1.00)
GFR, Estimated: >60 mL/min (ref >60)
Glucose, Bld: 161 mg/dL — ABNORMAL HIGH (ref 70–99)
Potassium: 4.5 mmol/L (ref 3.5–5.1)
Sodium: 145 mmol/L (ref 135–145)

## 2020-12-06 LAB — TROPONIN I (HIGH SENSITIVITY): Troponin I (High Sensitivity): 4 ng/L (ref <18)

## 2020-12-06 SURGERY — ARTHROPLASTY, SHOULDER, TOTAL, REVERSE
Anesthesia: General | Site: Shoulder | Laterality: Right

## 2020-12-06 MED ORDER — ONDANSETRON HCL 4 MG/2ML IJ SOLN
INTRAMUSCULAR | Status: DC | PRN
  Administered 2020-12-06: 4 mg via INTRAVENOUS

## 2020-12-06 MED ORDER — ONDANSETRON HCL 4 MG/2ML IJ SOLN
INTRAMUSCULAR | Status: AC
  Administered 2020-12-06: 4 mg via INTRAVENOUS
  Filled 2020-12-06: qty 2

## 2020-12-06 MED ORDER — DIPHENHYDRAMINE HCL 50 MG/ML IJ SOLN
INTRAMUSCULAR | Status: DC | PRN
  Administered 2020-12-06: 12.5 mg via INTRAVENOUS

## 2020-12-06 MED ORDER — CEFAZOLIN SODIUM-DEXTROSE 2-4 GM/100ML-% IV SOLN
2.0000 g | INTRAVENOUS | Status: AC
  Administered 2020-12-06: 2 g via INTRAVENOUS

## 2020-12-06 MED ORDER — ONDANSETRON HCL 4 MG PO TABS: 4.0000 mg | ORAL_TABLET | Freq: Three times a day (TID) | ORAL | 0 refills | Status: DC | PRN

## 2020-12-06 MED ORDER — ORAL CARE MOUTH RINSE: 15.0000 mL | Freq: Once | OROMUCOSAL | Status: AC

## 2020-12-06 MED ORDER — SODIUM CHLORIDE 0.9 % IV SOLN: INTRAVENOUS | Status: DC

## 2020-12-06 MED ORDER — VANCOMYCIN HCL 1000 MG IV SOLR
INTRAVENOUS | Status: AC
  Filled 2020-12-06: qty 20

## 2020-12-06 MED ORDER — 0.9 % SODIUM CHLORIDE (POUR BTL) OPTIME
TOPICAL | Status: DC | PRN
  Administered 2020-12-06: 1000 mL

## 2020-12-06 MED ORDER — LORAZEPAM 2 MG/ML IJ SOLN
1.0000 mg | Freq: Once | INTRAMUSCULAR | Status: AC
  Administered 2020-12-06: 1 mg via INTRAVENOUS
  Filled 2020-12-06: qty 1

## 2020-12-06 MED ORDER — ROCURONIUM BROMIDE 10 MG/ML (PF) SYRINGE
PREFILLED_SYRINGE | INTRAVENOUS | Status: AC
  Filled 2020-12-06: qty 10

## 2020-12-06 MED ORDER — MIDAZOLAM HCL 2 MG/2ML IJ SOLN
INTRAMUSCULAR | Status: AC
  Filled 2020-12-06: qty 2

## 2020-12-06 MED ORDER — TRANEXAMIC ACID-NACL 1000-0.7 MG/100ML-% IV SOLN
INTRAVENOUS | Status: AC
  Filled 2020-12-06: qty 100

## 2020-12-06 MED ORDER — PHENYLEPHRINE HCL-NACL 20-0.9 MG/250ML-% IV SOLN
INTRAVENOUS | Status: DC | PRN
  Administered 2020-12-06: 25 ug/min via INTRAVENOUS

## 2020-12-06 MED ORDER — MIDAZOLAM HCL 5 MG/5ML IJ SOLN
INTRAMUSCULAR | Status: DC | PRN
  Administered 2020-12-06 (×2): 1 mg via INTRAVENOUS

## 2020-12-06 MED ORDER — STERILE WATER FOR IRRIGATION IR SOLN
Status: DC | PRN
  Administered 2020-12-06: 2000 mL

## 2020-12-06 MED ORDER — METOCLOPRAMIDE HCL 5 MG PO TABS
5.0000 mg | ORAL_TABLET | Freq: Three times a day (TID) | ORAL | Status: DC | PRN
  Filled 2020-12-06: qty 2

## 2020-12-06 MED ORDER — ONDANSETRON HCL 4 MG/2ML IJ SOLN
INTRAMUSCULAR | Status: AC
  Filled 2020-12-06: qty 2

## 2020-12-06 MED ORDER — PROPOFOL 10 MG/ML IV BOLUS
INTRAVENOUS | Status: AC
  Filled 2020-12-06: qty 20

## 2020-12-06 MED ORDER — DEXAMETHASONE SODIUM PHOSPHATE 10 MG/ML IJ SOLN
INTRAMUSCULAR | Status: DC | PRN
Start: 2020-12-06 — End: 2020-12-06
  Administered 2020-12-06: 10 mg via INTRAVENOUS

## 2020-12-06 MED ORDER — LIDOCAINE 2% (20 MG/ML) 5 ML SYRINGE
INTRAMUSCULAR | Status: DC | PRN
  Administered 2020-12-06: 50 mg via INTRAVENOUS

## 2020-12-06 MED ORDER — CYCLOBENZAPRINE HCL 10 MG PO TABS: 10.0000 mg | ORAL_TABLET | Freq: Three times a day (TID) | ORAL | 1 refills | Status: DC | PRN

## 2020-12-06 MED ORDER — LACTATED RINGERS IV BOLUS
250.0000 mL | Freq: Once | INTRAVENOUS | Status: AC
  Administered 2020-12-06: 250 mL via INTRAVENOUS

## 2020-12-06 MED ORDER — ALBUTEROL SULFATE HFA 108 (90 BASE) MCG/ACT IN AERS: 2.0000 | INHALATION_SPRAY | RESPIRATORY_TRACT | Status: DC | PRN

## 2020-12-06 MED ORDER — ONDANSETRON HCL 4 MG/2ML IJ SOLN: 4.0000 mg | Freq: Four times a day (QID) | INTRAMUSCULAR | Status: DC | PRN

## 2020-12-06 MED ORDER — OXYCODONE-ACETAMINOPHEN 5-325 MG PO TABS: 0.5000 | ORAL_TABLET | ORAL | 0 refills | Status: DC | PRN

## 2020-12-06 MED ORDER — TRANEXAMIC ACID-NACL 1000-0.7 MG/100ML-% IV SOLN
1000.0000 mg | INTRAVENOUS | Status: AC
  Administered 2020-12-06: 1000 mg via INTRAVENOUS

## 2020-12-06 MED ORDER — APREPITANT 40 MG PO CAPS
40.0000 mg | ORAL_CAPSULE | Freq: Once | ORAL | Status: AC
  Administered 2020-12-06: 40 mg via ORAL

## 2020-12-06 MED ORDER — PHENYLEPHRINE HCL (PRESSORS) 10 MG/ML IV SOLN
INTRAVENOUS | Status: AC
  Filled 2020-12-06: qty 1

## 2020-12-06 MED ORDER — BUPIVACAINE-EPINEPHRINE (PF) 0.5% -1:200000 IJ SOLN
INTRAMUSCULAR | Status: DC | PRN
  Administered 2020-12-06: 15 mL via PERINEURAL

## 2020-12-06 MED ORDER — PROPOFOL 10 MG/ML IV BOLUS
INTRAVENOUS | Status: DC | PRN
  Administered 2020-12-06: 120 mg via INTRAVENOUS

## 2020-12-06 MED ORDER — DEXAMETHASONE SODIUM PHOSPHATE 10 MG/ML IJ SOLN
INTRAMUSCULAR | Status: AC
  Filled 2020-12-06: qty 1

## 2020-12-06 MED ORDER — SUGAMMADEX SODIUM 500 MG/5ML IV SOLN
INTRAVENOUS | Status: AC
  Filled 2020-12-06: qty 5

## 2020-12-06 MED ORDER — TRANEXAMIC ACID 1000 MG/10ML IV SOLN: 1000.0000 mg | INTRAVENOUS | Status: DC

## 2020-12-06 MED ORDER — BUPIVACAINE LIPOSOME 1.3 % IJ SUSP
INTRAMUSCULAR | Status: DC | PRN
  Administered 2020-12-06: 10 mL via PERINEURAL

## 2020-12-06 MED ORDER — TRAMADOL HCL 50 MG PO TABS: 50.0000 mg | ORAL_TABLET | Freq: Three times a day (TID) | ORAL | 0 refills | Status: DC | PRN

## 2020-12-06 MED ORDER — ONDANSETRON HCL 4 MG PO TABS
4.0000 mg | ORAL_TABLET | Freq: Four times a day (QID) | ORAL | Status: DC | PRN
  Filled 2020-12-06: qty 1

## 2020-12-06 MED ORDER — APREPITANT 40 MG PO CAPS
ORAL_CAPSULE | ORAL | Status: AC
  Filled 2020-12-06: qty 1

## 2020-12-06 MED ORDER — CHLORHEXIDINE GLUCONATE 0.12 % MT SOLN
15.0000 mL | Freq: Once | OROMUCOSAL | Status: AC
  Administered 2020-12-06: 15 mL via OROMUCOSAL

## 2020-12-06 MED ORDER — MORPHINE SULFATE (PF) 4 MG/ML IV SOLN: 1.0000 mg | INTRAVENOUS | Status: DC | PRN

## 2020-12-06 MED ORDER — VANCOMYCIN HCL 1000 MG IV SOLR
INTRAVENOUS | Status: DC | PRN
  Administered 2020-12-06: 1000 mg via TOPICAL

## 2020-12-06 MED ORDER — FENTANYL CITRATE (PF) 250 MCG/5ML IJ SOLN
INTRAMUSCULAR | Status: AC
  Filled 2020-12-06: qty 5

## 2020-12-06 MED ORDER — LACTATED RINGERS IV BOLUS
500.0000 mL | Freq: Once | INTRAVENOUS | Status: AC
  Administered 2020-12-06: 500 mL via INTRAVENOUS

## 2020-12-06 MED ORDER — ROCURONIUM BROMIDE 10 MG/ML (PF) SYRINGE
PREFILLED_SYRINGE | INTRAVENOUS | Status: DC | PRN
  Administered 2020-12-06: 70 mg via INTRAVENOUS

## 2020-12-06 MED ORDER — FENTANYL CITRATE (PF) 100 MCG/2ML IJ SOLN
INTRAMUSCULAR | Status: DC | PRN
  Administered 2020-12-06 (×2): 50 ug via INTRAVENOUS

## 2020-12-06 MED ORDER — DIPHENHYDRAMINE HCL 50 MG/ML IJ SOLN
INTRAMUSCULAR | Status: AC
  Filled 2020-12-06: qty 1

## 2020-12-06 MED ORDER — CEFAZOLIN SODIUM-DEXTROSE 2-4 GM/100ML-% IV SOLN
INTRAVENOUS | Status: AC
  Filled 2020-12-06: qty 100

## 2020-12-06 MED ORDER — METOCLOPRAMIDE HCL 5 MG/ML IJ SOLN: 5.0000 mg | Freq: Three times a day (TID) | INTRAMUSCULAR | Status: DC | PRN

## 2020-12-06 MED ORDER — LIDOCAINE HCL (PF) 2 % IJ SOLN
INTRAMUSCULAR | Status: AC
  Filled 2020-12-06: qty 5

## 2020-12-06 MED ORDER — IOHEXOL 350 MG/ML SOLN
100.0000 mL | Freq: Once | INTRAVENOUS | Status: AC | PRN
  Administered 2020-12-06: 80 mL via INTRAVENOUS

## 2020-12-06 MED ORDER — LACTATED RINGERS IV SOLN: INTRAVENOUS | Status: DC

## 2020-12-06 MED ORDER — ONDANSETRON HCL 4 MG/2ML IJ SOLN: 4.0000 mg | Freq: Once | INTRAMUSCULAR | Status: AC | PRN

## 2020-12-06 SURGICAL SUPPLY — 69 items
ADH SKN CLS APL DERMABOND .7 (GAUZE/BANDAGES/DRESSINGS) ×1
AID PSTN UNV HD RSTRNT DISP (MISCELLANEOUS) ×1
BAG COUNTER SPONGE SURGICOUNT (BAG) IMPLANT
BAG SPEC THK2 15X12 ZIP CLS (MISCELLANEOUS) ×1
BAG SPNG CNTER NS LX DISP (BAG)
BAG ZIPLOCK 12X15 (MISCELLANEOUS) ×2 IMPLANT
BLADE SAW SGTL 83.5X18.5 (BLADE) ×2 IMPLANT
BSPLAT GLND +2X24 MDLR (Joint) ×1 IMPLANT
COOLER ICEMAN CLASSIC (MISCELLANEOUS) ×2 IMPLANT
COVER BACK TABLE 60X90IN (DRAPES) ×2 IMPLANT
COVER SURGICAL LIGHT HANDLE (MISCELLANEOUS) ×2 IMPLANT
CUP SUT UNIV REVERS 36 NEUTRAL (Cup) ×2 IMPLANT
DERMABOND ADVANCED (GAUZE/BANDAGES/DRESSINGS) ×1
DERMABOND ADVANCED .7 DNX12 (GAUZE/BANDAGES/DRESSINGS) ×1 IMPLANT
DRAPE INCISE IOBAN 66X45 STRL (DRAPES) IMPLANT
DRAPE ORTHO SPLIT 77X108 STRL (DRAPES) ×4
DRAPE SHEET LG 3/4 BI-LAMINATE (DRAPES) ×2 IMPLANT
DRAPE SURG 17X11 SM STRL (DRAPES) ×2 IMPLANT
DRAPE SURG ORHT 6 SPLT 77X108 (DRAPES) ×2 IMPLANT
DRAPE TOP 10253 STERILE (DRAPES) ×2 IMPLANT
DRAPE U-SHAPE 47X51 STRL (DRAPES) ×2 IMPLANT
DRESSING AQUACEL AG SP 3.5X6 (GAUZE/BANDAGES/DRESSINGS) ×1 IMPLANT
DRSG AQUACEL AG ADV 3.5X10 (GAUZE/BANDAGES/DRESSINGS) ×2 IMPLANT
DRSG AQUACEL AG SP 3.5X6 (GAUZE/BANDAGES/DRESSINGS) ×2
DRSG DERMACEA 8X12 NADH (GAUZE/BANDAGES/DRESSINGS) ×2 IMPLANT
DURAPREP 26ML APPLICATOR (WOUND CARE) ×2 IMPLANT
ELECT BLADE TIP CTD 4 INCH (ELECTRODE) ×2 IMPLANT
ELECT REM PT RETURN 15FT ADLT (MISCELLANEOUS) ×2 IMPLANT
FACESHIELD WRAPAROUND (MASK) ×8 IMPLANT
GLENOID UNI REV MOD 24 +2 LAT (Joint) ×2 IMPLANT
GLENOSPHERE 36 +4 LAT/24 (Joint) ×2 IMPLANT
GLOVE SRG 8 PF TXTR STRL LF DI (GLOVE) ×1 IMPLANT
GLOVE SURG ENC MOIS LTX SZ7 (GLOVE) ×2 IMPLANT
GLOVE SURG ENC MOIS LTX SZ7.5 (GLOVE) ×2 IMPLANT
GLOVE SURG UNDER POLY LF SZ7 (GLOVE) ×2 IMPLANT
GLOVE SURG UNDER POLY LF SZ8 (GLOVE) ×2
GOWN STRL REUS W/TWL LRG LVL3 (GOWN DISPOSABLE) ×4 IMPLANT
KIT BASIN OR (CUSTOM PROCEDURE TRAY) ×2 IMPLANT
KIT TURNOVER KIT A (KITS) ×2 IMPLANT
LINER HUMERAL 36 +3MM SM (Shoulder) ×2 IMPLANT
MANIFOLD NEPTUNE II (INSTRUMENTS) ×2 IMPLANT
NEEDLE TAPERED W/ NITINOL LOOP (MISCELLANEOUS) ×2 IMPLANT
NS IRRIG 1000ML POUR BTL (IV SOLUTION) ×2 IMPLANT
PACK SHOULDER (CUSTOM PROCEDURE TRAY) ×2 IMPLANT
PAD ARMBOARD 7.5X6 YLW CONV (MISCELLANEOUS) ×2 IMPLANT
PAD COLD SHLDR WRAP-ON (PAD) ×2 IMPLANT
PIN NITINOL TARGETER 2.8 (PIN) IMPLANT
PIN SET MODULAR GLENOID SYSTEM (PIN) IMPLANT
RESTRAINT HEAD UNIVERSAL NS (MISCELLANEOUS) ×2 IMPLANT
SCREW CENTRAL MODULAR 25 (Screw) ×2 IMPLANT
SCREW PERI LOCK 5.5X16 (Screw) ×6 IMPLANT
SCREW PERIPHERAL 5.5X20 LOCK (Screw) ×2 IMPLANT
SLING ARM FOAM STRAP LRG (SOFTGOODS) IMPLANT
SLING ARM FOAM STRAP MED (SOFTGOODS) ×2 IMPLANT
SPONGE T-LAP 18X18 ~~LOC~~+RFID (SPONGE) IMPLANT
STEM HUMERAL UNI REVERS SZ6 (Stem) ×2 IMPLANT
SUCTION FRAZIER HANDLE 12FR (TUBING) ×1
SUCTION TUBE FRAZIER 12FR DISP (TUBING) ×1 IMPLANT
SUT FIBERWIRE #2 38 T-5 BLUE (SUTURE)
SUT MNCRL AB 3-0 PS2 18 (SUTURE) ×2 IMPLANT
SUT MON AB 2-0 CT1 36 (SUTURE) ×2 IMPLANT
SUT VIC AB 1 CT1 36 (SUTURE) ×2 IMPLANT
SUTURE FIBERWR #2 38 T-5 BLUE (SUTURE) IMPLANT
SUTURE TAPE 1.3 40 TPR END (SUTURE) ×2 IMPLANT
SUTURETAPE 1.3 40 TPR END (SUTURE) ×4
TOWEL OR 17X26 10 PK STRL BLUE (TOWEL DISPOSABLE) ×2 IMPLANT
TOWEL OR NON WOVEN STRL DISP B (DISPOSABLE) ×2 IMPLANT
WATER STERILE IRR 1000ML POUR (IV SOLUTION) ×4 IMPLANT
YANKAUER SUCT BULB TIP 10FT TU (MISCELLANEOUS) ×2 IMPLANT

## 2020-12-06 NOTE — ED Provider Notes (Signed)
Patient signed out to me at 4 PM.  Awaiting CT scan to rule out PE or other process.  Patient had right shoulder surgery today and has some shortness of breath afterwards.  Could be pain related.  She was given some Ativan with great improvement.  Lab work is unremarkable.  CT scan shows no PE.  There is some atelectasis.  She is already been given an Chiropodist by Psychologist, sport and exercise today.  She does have an incidental thyroid mass we will have her get ultrasound outpatient.  Discharged in good condition.  This chart was dictated using voice recognition software.  Despite best efforts to proofread,  errors can occur which can change the documentation meaning.    Lennice Sites, DO 12/06/20 1907

## 2020-12-06 NOTE — Discharge Instructions (Addendum)
Follow-up with your primary care doctor for thyroid ultrasound to further evaluate thyroid nodule.  Continue to use incentive spirometer and pain medicine at home.

## 2020-12-06 NOTE — ED Notes (Signed)
Pt extremely anxious upon this writer's arrival to the room.  After placing 1L of supplemental O2 on pt for comfort as well as therapeutic communication pt's anxiety began to ease.  Breath sounds clear b/l.  Pt in NAD at this time.

## 2020-12-06 NOTE — Transfer of Care (Signed)
Immediate Anesthesia Transfer of Care Note  Patient: Martha Perkins  Procedure(s) Performed: REVERSE SHOULDER ARTHROPLASTY (Right: Shoulder)  Patient Location: PACU  Anesthesia Type:General  Level of Consciousness: awake, alert , oriented and patient cooperative  Airway & Oxygen Therapy: Patient Spontanous Breathing and Patient connected to face mask oxygen  Post-op Assessment: Report given to RN, Post -op Vital signs reviewed and stable and Patient moving all extremities X 4  Post vital signs: stable  Last Vitals:  Vitals Value Taken Time  BP 143/69 12/06/20 0930  Temp    Pulse 60 12/06/20 0930  Resp 22 12/06/20 0930  SpO2 100 % 12/06/20 0930  Vitals shown include unvalidated device data.  Last Pain:  Vitals:   12/06/20 0552  TempSrc: Oral  PainSc:          Complications: No notable events documented.

## 2020-12-06 NOTE — Op Note (Signed)
12/06/2020  9:23 AM  PATIENT:   Martha Perkins  75 y.o. female  PRE-OPERATIVE DIAGNOSIS:  Right shoulder rotator cuff tear arthropathy  POST-OPERATIVE DIAGNOSIS: Same  PROCEDURE: Right shoulder reverse arthroplasty utilizing a press-fit size 6 Arthrex stem with a neutral metaphysis, +3 polyethylene insert, 36/+4 glenosphere on a small/+2 baseplate  SURGEON:  Artavia Jeanlouis, Metta Clines M.D.  ASSISTANTS: Jenetta Loges, PA-C  ANESTHESIA:   General endotracheal and interscalene block with Exparel  EBL: 150 cc  SPECIMEN: None  Drains: None   PATIENT DISPOSITION:  PACU - hemodynamically stable.    PLAN OF CARE: Discharge to home after PACU  Brief history:  Patient is a 75 year old female well-known to our practice after previous left shoulder reverse arthroplasty who has been followed for chronic and progressively increasing right shoulder pain secondary to severe rotator cuff tear arthropathy.  Due to her increasing functional imitations and failure to respond to prolonged attempts at conservative management, she is brought to the operating room at this time for planned right shoulder reverse arthroplasty.  Preoperatively, I counseled the patient regarding treatment options and risks versus benefits thereof.  Possible surgical complications were all reviewed including potential for bleeding, infection, neurovascular injury, persistent pain, loss of motion, anesthetic complication, failure of the implant, and possible need for additional surgery. They understand and accept and agrees with our planned procedure.   Procedure in detail:  After undergoing routine preop evaluation the patient received prophylactic antibiotics and interscalene block with Exparel was established in the holding area by the anesthesia department.  Patient was subsequently placed supine on the operating table and underwent the smooth induction of the general endotracheal anesthesia.  Placed in the beachchair position  and appropriately padded and protected.  The right shoulder girdle region was sterilely prepped and draped in standard fashion.  Timeout was called.  A deltopectoral approach to the right shoulder is made through an approximate centimeter incision.  Skin flaps were elevated dissection carried deeply and the deltopectoral interval was developed from proximal to distal with the vein taken laterally and electrocautery was then used for hemostasis.  The conjoined tendon was mobilized and retracted medially and the upper centimeter and a half of the pectoralis major was tenotomized for exposure.  Adhesions were divided from beneath the deltoid and the long head biceps tendon was then tenodesed at the upper border the pectoralis major tendon with proximal segment unroofed and excised.  The remnant of the rotator cuff superiorly was split from the apex of the bicipital groove to the base of the coracoid and the subscap was then separated from the lesser tuberosity in a subperiosteal fashion and tagged with a pair of suture tape sutures.  The capsular attachments were then divided from the anterior and infra margins of the humeral neck allowing deliver the humeral head through the wound.  An extra medullary guide was then used to outline a proposed humeral head resection which we then performed with an oscillating saw at approximately 20 degrees retroversion.  The peripheral osteophytes were then removed with a rondure.  A metal cap was then placed at the cut proximal humeral surface and the glenoid was then exposed with appropriate retractors.  A circumferential labral resection was then completed gaining complete visualization of the glenoid.  A guidepin was then directed into the center of the glenoid with an approximately 10 degree inferior tilt and the glenoid was then reamed to the central and peripheral reamers to a stable subchondral bony bed.  All soft  tissue related debris was then carefully irrigated free and  removed.  I should mention also that we identified a large osteochondral loose body in the subcoracoid recess which was ultimately removed.  The glenoid preparation was completed with the central drill and tapped for a 25 mm lag screw.  Our baseplate was assembled and vancomycin powder applied to the threads relaxer and the baseplate was then seated with excellent fit and fixation.  A 36/+4 glenosphere was then impacted onto the baseplate and the central locking screw was then placed.  We then returned our attention back to the proximal humerus where the canal was opened hand reaming and then broaching up to a size 6.  Metaphysis was then reamed with a neutral reamer.  We then performed a trial reduction.  We found that the soft tissue tension was a bit excessive and so the trial was removed and we rebroached seating the implant slightly deeper with repeat Pete reaming of the metaphysis and this ultimately gave [place] appropriate soft tissue balance with good motion and good stability.  This point the trial was then removed.  The final implant was assembled.  Vancomycin powder was then sprayed liberally into the humeral canal and the final implant was then seated.  A +3 polyethylene insert had been selected which we impacted and final reduction was performed showing good motion good stability good soft tissue balance.  This point then confirmed proper mobility of the subscapularis musculotendinous unit and the subscap was repaired back to the eyelets on the collar the implant using the previously placed suture tape sutures.  The arm then easily achieved 30 degrees of external rotation without excessive tension on the subscap repair.  The wound was then copiously irrigated.  Final hemostasis was obtained.  The balance of vancomycin powder was then spread liberally throughout the deep soft tissue layers.  The deltopectoral interval was reapproximated with a series of figure-of-eight number Vicryl sutures.  2-0 Monocryl  used for subcu layer and intracuticular 3-0 Monocryl used for the skin followed by Dermabond and Aquacel dressing.  The right arm was then placed into a sling.  The patient was awakened, extubated, and taken to the recovery room in stable condition.  Jenetta Loges, PA-C was utilized as an Environmental consultant throughout this case, essential for help with positioning the patient, positioning extremity, tissue manipulation, implantation of the prosthesis, suture management, wound closure, and intraoperative decision-making.  Marin Shutter MD   Contact # [phone]

## 2020-12-06 NOTE — ED Notes (Signed)
Pt ambulated to the bathroom, shob became increasingly worse.

## 2020-12-06 NOTE — Anesthesia Postprocedure Evaluation (Signed)
Anesthesia Post Note  Patient: Martha Perkins  Procedure(s) Performed: REVERSE SHOULDER ARTHROPLASTY (Right: Shoulder)     Patient location during evaluation: PACU Anesthesia Type: General Level of consciousness: awake and alert and oriented Pain management: pain level controlled Vital Signs Assessment: post-procedure vital signs reviewed and stable Respiratory status: spontaneous breathing, nonlabored ventilation and respiratory function stable Cardiovascular status: blood pressure returned to baseline and stable Postop Assessment: no apparent nausea or vomiting Anesthetic complications: no   No notable events documented.  Last Vitals:  Vitals:   12/06/20 0945 12/06/20 1000  BP: 130/73 136/71  Pulse: (!) 58 (!) 55  Resp: 18 17  Temp:    SpO2: 92% 93%    Last Pain:  Vitals:   12/06/20 1000  TempSrc:   PainSc: 0-No pain                 Loyalty Arentz A.

## 2020-12-06 NOTE — ED Provider Notes (Signed)
[facility] DEPT Provider Note   CSN: [id number] Arrival date & time: 12/06/20  1237     History Chief Complaint  Patient presents with   Shortness of Breath    Martha Perkins is a 75 y.o. female.  75 year old female presents with cute onset of shortness of breath which began today after she was going home from having right shoulder surgery.  States that she has had dyspnea on exertion.  No hemoptysis or cough.  No fever.  States that she just cannot get a deep breath and.  No prior history of same.  No recent leg pain or swelling.  No treatment use prior to arrival      Past Medical History:  Diagnosis Date   Arthritis    neck, back, hips, knees, ankles   Asthma    use everyday   Cancer ([place]) 2019   skin   Dyspnea    Dysrhythmia    PVCs , Afib   Heart murmur    History of kidney stones    Hyperlipidemia    Hypertension    Hyperthyroidism 08/08/2017   radio active iodine x2   Hypothyroidism    now take synthroid   Paroxysmal atrial fibrillation ([place]) 08/09/2015   PONV (postoperative nausea and vomiting)     Patient Active Problem List   Diagnosis Date Noted   Venous insufficiency of both lower extremities 10/03/2020   S/P reverse total shoulder arthroplasty, 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   Aortic atherosclerosis ([place]) 12/26/2019   Palpitations 10/21/2019   Morbid obesity ([place]) 10/21/2019   Encounter for chronic pain management 06/30/2019   Hypothyroidism 09/11/2016   History of atrial fibrillation    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/14/2016   Restrictive lung disease 09/28/2015   Lumbar degenerative disc disease 08/15/2015   PAF (paroxysmal atrial fibrillation) ([place]) 08/09/2015   Chronic allergic rhinitis 05/17/2014   Cataract 06/03/2012   Hyperlipidemia LDL goal <100 12/21/2010   Essential hypertension 12/21/2010   GERD (gastroesophageal reflux disease) 07/18/2010   Insulin resistance 07/18/2010   Fatty liver 07/18/2010     Past Surgical History:  Procedure Laterality Date   CARDIOVERSION N/A 08/05/2016   Procedure: CARDIOVERSION;  Surgeon: Skeet Latch, MD;  Location: [facility];  Service: Cardiovascular;  Laterality: N/A;   CATARACT EXTRACTION Left 09/2012   Dr. Bunnie Philips   LITHOTRIPSY     REPLACEMENT TOTAL KNEE BILATERAL  2011   REVERSE SHOULDER ARTHROPLASTY Left 07/19/2020   Procedure: REVERSE SHOULDER ARTHROPLASTY;  Surgeon: Justice Britain, MD;  Location: [facility];  Service: Orthopedics;  Laterality: Left;  162min   THYROIDECTOMY  1971   TUBAL LIGATION  1979     OB History   No obstetric history on file.     Family History  Problem Relation Age of Onset   Coronary artery disease Mother 32       deceased   Depression Mother    Hyperlipidemia Mother    Diabetes Paternal Grandmother    Thyroid disease Sister    COPD Father    Breast cancer Neg Hx     Social History   Tobacco Use   Smoking status: Never   Smokeless tobacco: Never  Vaping Use   Vaping Use: Never used  Substance Use Topics   Alcohol use: No   Drug use: No    Home Medications Prior to Admission medications   Medication Sig Start Date End Date Taking? Authorizing Provider  acetaminophen (TYLENOL) 500 MG tablet Take 500 mg by  mouth every 6 (six) hours as needed for moderate pain or headache.    [provider]  albuterol (VENTOLIN HFA) 108 (90 Base) MCG/ACT inhaler INHALE TWO PUFFS INTO THE LUNGS EVERY 6 HOURS AS NEEDED FOR WHEEZING OR SHORTNESS OF BREATH Patient taking differently: Inhale 2 puffs into the lungs every 6 (six) hours as needed for shortness of breath or wheezing. 07/25/19   Hali Marry, MD  atorvastatin (LIPITOR) 40 MG tablet TAKE 1 TABLET BY MOUTH  DAILY Patient taking differently: Take 40 mg by mouth at bedtime. 04/02/20   Hali Marry, MD  Biotin 1000 MCG tablet Take 1,000 mcg by mouth daily.    [provider]  Cholecalciferol (VITAMIN D) 125 MCG (5000 UT)  CAPS Take 5,000 Units by mouth daily. With K2    [provider]  clotrimazole-betamethasone (LOTRISONE) cream Apply 1 application topically 2 (two) times daily. Patient taking differently: Apply 1 application topically 2 (two) times daily as needed (fungus). 04/12/20   Hali Marry, MD  Coenzyme Q10 (COQ-10) 100 MG CAPS Take 100 mg by mouth daily.    [provider]  cyclobenzaprine (FLEXERIL) 10 MG tablet Take 1 tablet (10 mg total) by mouth 3 (three) times daily as needed for muscle spasms. 12/06/20   Shuford, Olivia Mackie, PA-C  cyclobenzaprine (FLEXERIL) 5 MG tablet Take 1 tablet (5 mg total) by mouth at bedtime as needed for muscle spasms. 11/14/20   Hali Marry, MD  fluticasone (FLONASE) 50 MCG/ACT nasal spray USE 1 SPRAY IN BOTH  NOSTRILS TWICE DAILY Patient taking differently: Place 1 spray into both nostrils 2 (two) times daily. 01/23/20   Hali Marry, MD  Fluticasone Furoate (ARNUITY ELLIPTA) 100 MCG/ACT AEPB Inhale 1 Inhaler into the lungs daily. 04/12/20   Hali Marry, MD  furosemide (LASIX) 20 MG tablet Take 3 tablets (60 mg total) by mouth daily. Patient taking differently: Take 60 mg by mouth daily as needed for fluid. 04/23/20   Terrilyn Saver, NP  ketoconazole (NIZORAL) 2 % cream Apply topically daily as needed for irritation. Patient taking differently: Apply 1 application topically daily as needed for irritation. 12/26/19   Hali Marry, MD  levothyroxine (SYNTHROID) 150 MCG tablet TAKE 1 TABLET BY MOUTH  DAILY BEFORE BREAKFAST TAKE EXTRA ONE-HALF TABLET BY  MOUTH 1 DAY WEEK Patient taking differently: Take 150-225 mcg by mouth daily before breakfast. TAKE 1 TABLET BY MOUTH  DAILY BEFORE BREAKFAST TAKE EXTRA ONE-HALF TABLET BY  MOUTH 1 DAY WEEK 225 mcg on Saturday and 150 mcg all other days 09/03/20   Hali Marry, MD  loratadine (CLARITIN) 10 MG tablet Take 10 mg by mouth at bedtime.    [provider]   Magnesium Citrate 200 MG TABS Take 200 mg by mouth daily.    [provider]  meloxicam (MOBIC) 15 MG tablet TAKE 1 TABLET BY MOUTH  DAILY 11/06/20   Hali Marry, MD  metoprolol succinate (TOPROL-XL) 50 MG 24 hr tablet Take 2 tablets (100 mg total) by mouth daily. TAKE 2 TABLETS BY MOUTH  DAILY WITH OR IMMEDIATELY  FOLLOWING A MEAL 11/27/20   Terrilyn Saver, NP  ondansetron (ZOFRAN) 4 MG tablet Take 1 tablet (4 mg total) by mouth every 8 (eight) hours as needed for nausea or vomiting. 12/06/20   Shuford, Olivia Mackie, PA-C  oxyCODONE-acetaminophen (PERCOCET) 5-325 MG tablet Take 0.5-1 tablets by mouth every 4 (four) hours as needed for severe pain (max 6 q). 12/06/20  Shuford, Olivia Mackie, PA-C  potassium chloride (KLOR-CON) 10 MEQ tablet TAKE 1 TABLET BY MOUTH  DAILY AS NEEDED Patient taking differently: Take 10 mEq by mouth daily as needed (Takes with Lasix). 10/01/20   Hali Marry, MD  traMADol (ULTRAM) 50 MG tablet Take 1 tablet (50 mg total) by mouth 3 (three) times daily as needed. 12/06/20   Shuford, Olivia Mackie, PA-C  TURMERIC PO Take 1,500 mg by mouth daily.    [provider]  valsartan (DIOVAN) 160 MG tablet Take 160 mg by mouth every evening.    [provider]  vitamin B-12 (CYANOCOBALAMIN) 1000 MCG tablet Take 1,000 mcg by mouth daily.    [provider]  vitamin C (ASCORBIC ACID) 500 MG tablet Take 500 mg by mouth 2 (two) times daily.    [provider]  Zinc 50 MG CAPS Take 50 mg by mouth daily.    [provider]    Allergies    Meperidine and related, Oxycodone, Percocet [oxycodone-acetaminophen], and Demerol  Review of Systems   Review of Systems  All other systems reviewed and are negative.  Physical Exam Updated Vital Signs BP (!) 178/74   Pulse 64   Temp 97.6 F (36.4 C) (Oral)   Resp (!) 32   Ht 1.575 m (5\' 2" )   Wt 94.4 kg   SpO2 92%   BMI 38.06 kg/m   Physical Exam Vitals and nursing note reviewed.   Constitutional:      General: She is not in acute distress.    Appearance: Normal appearance. She is well-developed. She is not toxic-appearing.  HENT:     Head: Normocephalic and atraumatic.  Eyes:     General: Lids are normal.     Conjunctiva/sclera: Conjunctivae normal.     Pupils: Pupils are equal, round, and reactive to light.  Neck:     Thyroid: No thyroid mass.     Trachea: No tracheal deviation.  Cardiovascular:     Rate and Rhythm: Normal rate and regular rhythm.     Heart sounds: Normal heart sounds. No murmur heard.   No gallop.  Pulmonary:     Effort: Pulmonary effort is normal. No respiratory distress.     Breath sounds: Normal breath sounds. No stridor. No decreased breath sounds, wheezing, rhonchi or rales.  Abdominal:     General: There is no distension.     Palpations: Abdomen is soft.     Tenderness: There is no abdominal tenderness. There is no rebound.  Musculoskeletal:        General: No tenderness. Normal range of motion.       Arms:     Cervical back: Normal range of motion and neck supple.  Skin:    General: Skin is warm and dry.     Findings: No abrasion or rash.  Neurological:     Mental Status: She is alert and oriented to person, place, and time. Mental status is at baseline.     GCS: GCS eye subscore is 4. GCS verbal subscore is 5. GCS motor subscore is 6.     Cranial Nerves: Cranial nerves are intact. No cranial nerve deficit.     Sensory: No sensory deficit.     Motor: Motor function is intact.  Psychiatric:        Attention and Perception: Attention normal.        Speech: Speech normal.        Behavior: Behavior normal.    ED Results / Procedures /  Treatments   Labs (all labs ordered are listed, but only abnormal results are displayed) Labs Reviewed  CBC WITH DIFFERENTIAL/PLATELET  BASIC METABOLIC PANEL  TROPONIN I (HIGH SENSITIVITY)    EKG EKG Interpretation  Date/Time:  Thursday December 06 2020 12:47:46 EDT Ventricular  Rate:  69 PR Interval:  205 QRS Duration: 100 QT Interval:  417 QTC Calculation: 447 R Axis:   73 Text Interpretation: Sinus rhythm Consider left atrial enlargement Anteroseptal infarct, age indeterminate Confirmed by Lacretia Leigh [id number]) on 12/06/2020 1:52:28 PM  Radiology DG Chest 2 View  Result Date: 12/06/2020 CLINICAL DATA:  Shortness of breath shoulder surgery postop day 0 EXAM: CHEST - 2 VIEW COMPARISON:  07/13/2015 FINDINGS: Mild cardiomegaly. No acute abnormality of the lungs. Status post bilateral shoulder reverse arthroplasty. Disc degenerative disease and partial ankylosis of the thoracic spine. IMPRESSION: Mild cardiomegaly. No acute abnormality of the lungs. Electronically Signed   By: Delanna Ahmadi M.D.   On: 12/06/2020 13:37    Procedures Procedures   Medications Ordered in ED Medications  albuterol (VENTOLIN HFA) 108 (90 Base) MCG/ACT inhaler 2 puff (has no administration in time range)  0.9 %  sodium chloride infusion (has no administration in time range)  LORazepam (ATIVAN) injection 1 mg (has no administration in time range)    ED Course  I have reviewed the triage vital signs and the nursing notes.  Pertinent labs & imaging results that were available during my care of the patient were reviewed by me and considered in my medical decision making (see chart for details).    MDM Rules/Calculators/A&P                           Patient with negative chest x-ray here.  Given Ativan which did improve her blood pressure.  Will need to have chest CT to rule out PE.  That is pending at this time and will be signed to next provider Final Clinical Impression(s) / ED Diagnoses Final diagnoses:  None    Rx / DC Orders ED Discharge Orders     None        Lacretia Leigh, MD 12/06/20 1622

## 2020-12-06 NOTE — H&P (Signed)
Martha Perkins    Chief Complaint: Right shoulder rotator cuff tear arthropathy HPI: The patient is a 75 y.o. female with chronic and progressively increasing right shoulder pain related to severe rotator cuff tear arthropathy.  Due to her increasing functional imitations and failure to respond to prolonged attempts at conservative management, she is brought to the operating room at this time for planned right shoulder reverse arthroplasty  Past Medical History:  Diagnosis Date   Arthritis    neck, back, hips, knees, ankles   Asthma    use everyday   Cancer ([place]) 2019   skin   Dyspnea    Dysrhythmia    PVCs , Afib   Heart murmur    History of kidney stones    Hyperlipidemia    Hypertension    Hyperthyroidism 08/08/2017   radio active iodine x2   Hypothyroidism    now take synthroid   Paroxysmal atrial fibrillation ([place]) 08/09/2015   PONV (postoperative nausea and vomiting)     Past Surgical History:  Procedure Laterality Date   CARDIOVERSION N/A 08/05/2016   Procedure: CARDIOVERSION;  Surgeon: Skeet Latch, MD;  Location: [facility];  Service: Cardiovascular;  Laterality: N/A;   CATARACT EXTRACTION Left 09/2012   Dr. Bunnie Philips   LITHOTRIPSY     REPLACEMENT TOTAL KNEE BILATERAL  2011   REVERSE SHOULDER ARTHROPLASTY Left 07/19/2020   Procedure: REVERSE SHOULDER ARTHROPLASTY;  Surgeon: Justice Britain, MD;  Location: [facility];  Service: Orthopedics;  Laterality: Left;  137min   THYROIDECTOMY  1971   TUBAL LIGATION  1979    Family History  Problem Relation Age of Onset   Coronary artery disease Mother 33       deceased   Depression Mother    Hyperlipidemia Mother    Diabetes Paternal Grandmother    Thyroid disease Sister    COPD Father    Breast cancer Neg Hx     Social History:  reports that she has never smoked. She has never used smokeless tobacco. She reports that she does not drink alcohol and does not use drugs.   Medications Prior to Admission   Medication Sig Dispense Refill   acetaminophen (TYLENOL) 500 MG tablet Take 500 mg by mouth every 6 (six) hours as needed for moderate pain or headache.     albuterol (VENTOLIN HFA) 108 (90 Base) MCG/ACT inhaler INHALE TWO PUFFS INTO THE LUNGS EVERY 6 HOURS AS NEEDED FOR WHEEZING OR SHORTNESS OF BREATH (Patient taking differently: Inhale 2 puffs into the lungs every 6 (six) hours as needed for shortness of breath or wheezing.) 8.5 g 2   atorvastatin (LIPITOR) 40 MG tablet TAKE 1 TABLET BY MOUTH  DAILY (Patient taking differently: Take 40 mg by mouth at bedtime.) 90 tablet 3   Biotin 1000 MCG tablet Take 1,000 mcg by mouth daily.     Cholecalciferol (VITAMIN D) 125 MCG (5000 UT) CAPS Take 5,000 Units by mouth daily. With K2     clotrimazole-betamethasone (LOTRISONE) cream Apply 1 application topically 2 (two) times daily. (Patient taking differently: Apply 1 application topically 2 (two) times daily as needed (fungus).) 45 g 2   Coenzyme Q10 (COQ-10) 100 MG CAPS Take 100 mg by mouth daily.     cyclobenzaprine (FLEXERIL) 5 MG tablet Take 1 tablet (5 mg total) by mouth at bedtime as needed for muscle spasms. 30 tablet 1   fluticasone (FLONASE) 50 MCG/ACT nasal spray USE 1 SPRAY IN BOTH  NOSTRILS TWICE DAILY (Patient taking differently:  Place 1 spray into both nostrils 2 (two) times daily.) 48 g 3   Fluticasone Furoate (ARNUITY ELLIPTA) 100 MCG/ACT AEPB Inhale 1 Inhaler into the lungs daily. 30 each 2   furosemide (LASIX) 20 MG tablet Take 3 tablets (60 mg total) by mouth daily. (Patient taking differently: Take 60 mg by mouth daily as needed for fluid.) 90 tablet 3   ketoconazole (NIZORAL) 2 % cream Apply topically daily as needed for irritation. (Patient taking differently: Apply 1 application topically daily as needed for irritation.) 60 g 3   levothyroxine (SYNTHROID) 150 MCG tablet TAKE 1 TABLET BY MOUTH  DAILY BEFORE BREAKFAST TAKE EXTRA ONE-HALF TABLET BY  MOUTH 1 DAY WEEK (Patient taking  differently: Take 150-225 mcg by mouth daily before breakfast. TAKE 1 TABLET BY MOUTH  DAILY BEFORE BREAKFAST TAKE EXTRA ONE-HALF TABLET BY  MOUTH 1 DAY WEEK 225 mcg on Saturday and 150 mcg all other days) 96 tablet 3   loratadine (CLARITIN) 10 MG tablet Take 10 mg by mouth at bedtime.     Magnesium Citrate 200 MG TABS Take 200 mg by mouth daily.     meloxicam (MOBIC) 15 MG tablet TAKE 1 TABLET BY MOUTH  DAILY 90 tablet 3   metoprolol succinate (TOPROL-XL) 50 MG 24 hr tablet Take 2 tablets (100 mg total) by mouth daily. TAKE 2 TABLETS BY MOUTH  DAILY WITH OR IMMEDIATELY  FOLLOWING A MEAL 90 tablet 3   potassium chloride (KLOR-CON) 10 MEQ tablet TAKE 1 TABLET BY MOUTH  DAILY AS NEEDED (Patient taking differently: Take 10 mEq by mouth daily as needed (Takes with Lasix).) 90 tablet 3   traMADol (ULTRAM) 50 MG tablet Take 1 tablet (50 mg total) by mouth 3 (three) times daily as needed. 90 tablet 0   TURMERIC PO Take 1,500 mg by mouth daily.     valsartan (DIOVAN) 160 MG tablet Take 160 mg by mouth every evening.     vitamin B-12 (CYANOCOBALAMIN) 1000 MCG tablet Take 1,000 mcg by mouth daily.     vitamin C (ASCORBIC ACID) 500 MG tablet Take 500 mg by mouth 2 (two) times daily.     Zinc 50 MG CAPS Take 50 mg by mouth daily.     predniSONE (DELTASONE) 20 MG tablet Take 2 tablets (40 mg total) by mouth daily with breakfast. (Patient not taking: No sig reported) 10 tablet 0     Physical Exam: Right shoulder demonstrates painful and guarded motion as noted at her recent office visits.  Globally decreased strength.  Plain radiographs confirm changes consistent with chronic rotator cuff tear arthropathy.  Vitals  Temp:  [98.7 F (37.1 C)] 98.7 F (37.1 C) (09/29 0552) Pulse Rate:  [78] 78 (09/29 0552) Resp:  [18] 18 (09/29 0552) BP: (154)/(76) 154/76 (09/29 0552) SpO2:  [100 %] 100 % (09/29 0552) Weight:  [94.4 kg] 94.4 kg (09/29 0549)  Assessment/Plan  Impression: Right shoulder rotator cuff  tear arthropathy  Plan of Action: Procedure(s): REVERSE SHOULDER ARTHROPLASTY  Tobby Fawcett M Tzion Wedel 12/06/2020, 6:37 AM Contact # [phone]

## 2020-12-06 NOTE — Discharge Instructions (Signed)
 Martha Perkins, M.D., F.A.A.O.S. Orthopaedic Surgery Specializing in Arthroscopic and Reconstructive Surgery of the Shoulder [phone] [address] - [place], [place] [address] - Fax [phone]   POST-OP TOTAL SHOULDER REPLACEMENT INSTRUCTIONS  1. Follow up in the office for your first post-op appointment 10-14 days from the date of your surgery. If you do not already have a scheduled appointment, our office will contact you to schedule.  2. The bandage over your incision is waterproof. You may begin showering with this dressing on. You may leave this dressing on until first follow up appointment within 2 weeks. We prefer you leave this dressing in place until follow up however after 5-7 days if you are having itching or skin irritation and would like to remove it you may do so. Go slow and tug at the borders gently to break the bond the dressing has with the skin. At this point if there is no drainage it is okay to go without a bandage or you may cover it with a light guaze and tape. You can also expect significant bruising around your shoulder that will drift down your arm and into your chest wall. This is very normal and should resolve over several days.   3. Wear your sling/immobilizer at all times except to perform the exercises below or to occasionally let your arm dangle by your side to stretch your elbow. You also need to sleep in your sling immobilizer until instructed otherwise. It is ok to remove your sling if you are sitting in a controlled environment and allow your arm to rest in a position of comfort by your side or on your lap with pillows to give your neck and skin a break from the sling. You may remove it to allow arm to dangle by side to shower. If you are up walking around and when you go to sleep at night you need to wear it.  4. Range of motion to your elbow, wrist, and hand are encouraged 3-5 times daily. Exercise to your hand and fingers helps to reduce  swelling you may experience.   5. Prescriptions for a pain medication and a muscle relaxant are provided for you. It is recommended that if you are experiencing pain that you pain medication alone is not controlling, add the muscle relaxant along with the pain medication which can give additional pain relief. The first 1-2 days is generally the most severe of your pain and then should gradually decrease. As your pain lessens it is recommended that you decrease your use of the pain medications to an "as needed basis'" only and to always comply with the recommended dosages of the pain medications.  6. Pain medications can produce constipation along with their use. If you experience this, the use of an over the counter stool softener or laxative daily is recommended.   7. For additional questions or concerns, please do not hesitate to call the office. If after hours there is an answering service to forward your concerns to the physician on call.  8.Pain control following an exparel block  To help control your post-operative pain you received a nerve block  performed with Exparel which is a long acting anesthetic (numbing agent) which can provide pain relief and sensations of numbness (and relief of pain) in the operative shoulder and arm for up to 3 days. Sometimes it provides mixed relief, meaning you may still have numbness in certain areas of the arm but can still be able to   move  parts of that arm, hand, and fingers. We recommend that your prescribed pain medications  be used as needed. We do not feel it is necessary to "pre medicate" and "stay ahead" of pain.  Taking narcotic pain medications when you are not having any pain can lead to unnecessary and potentially dangerous side effects.    9. Use the ice machine as much as possible in the first 5-7 days from surgery, then you can wean its use to as needed. The ice typically needs to be replaced every 6 hours, instead of ice you can actually freeze  water bottles to put in the cooler and then fill water around them to avoid having to purchase ice. You can have spare water bottles freezing to allow you to rotate them once they have melted. Try to have a thin shirt or light cloth or towel under the ice wrap to protect your skin.   FOR ADDITIONAL INFO ON ICE MACHINE AND INSTRUCTIONS GO TO THE WEBSITE AT  https://[email or web address].com/products/donjoy/[email or web address]  10.  We recommend that you avoid any dental work or cleaning in the first 3 months following your joint replacement. This is to help minimize the possibility of infection from the bacteria in your mouth that enters your bloodstream during dental work. We also recommend that you take an antibiotic prior to your dental work for the first year after your shoulder replacement to further help reduce that risk. Please simply contact our office for antibiotics to be sent to your pharmacy prior to dental work.  11. Dental Antibiotics:  In most cases prophylactic antibiotics for Dental procdeures after total joint surgery are not necessary.  Exceptions are as follows:  1. History of prior total joint infection  2. Severely immunocompromised (Organ Transplant, cancer chemotherapy, Rheumatoid biologic meds such as Humera)  3. Poorly controlled diabetes (A1C &gt; 8.0, blood glucose over 200)  If you have one of these conditions, contact your surgeon for an antibiotic prescription, prior to your dental procedure.   POST-OP EXERCISES  Pendulum Exercises  Perform pendulum exercises while standing and bending at the waist. Support your uninvolved arm on a table or chair and allow your operated arm to hang freely. Make sure to do these exercises passively - not using you shoulder muscles. These exercises can be performed once your nerve block effects have worn off.  Repeat 20 times. Do 3 sessions per day.     

## 2020-12-06 NOTE — Anesthesia Procedure Notes (Signed)
Anesthesia Regional Block: Interscalene brachial plexus block  Laterality: Right  Prep: chloraprep       Needles:  Injection technique: Single-shot  Needle Type: Echogenic Stimulator Needle     Needle Length: 10cm  Needle Gauge: 21   Needle insertion depth: 7 cm   Additional Needles:   Procedures:,,,, ultrasound used (permanent image in chart),,   Motor weakness within 5 minutes.  Narrative:  Start time: 12/06/2020 7:09 AM End time: 12/06/2020 7:14 AM Injection made incrementally with aspirations every 5 mL.  Performed by: Personally  Anesthesiologist: Josephine Igo, MD  Additional Notes: Timeout performed. Patient sedated. Relevant anatomy ID'd using [place]. Incremental 2-49ml injection of LA with frequent aspiration. Patient tolerated procedure well.    Right Interscalene Block

## 2020-12-06 NOTE — ED Triage Notes (Signed)
Patient states she had right shoulder replacement surgery and just left from the short stay and was getting into her vehicle. Patient states she developed SOB. Patient states she used her Albuterol inhaler twice with no results.

## 2020-12-06 NOTE — Anesthesia Procedure Notes (Signed)
Procedure Name: Intubation Date/Time: 12/06/2020 7:46 AM Performed by: Lissa Morales, CRNA Pre-anesthesia Checklist: Patient identified, Emergency Drugs available, Suction available and Patient being monitored Patient Re-evaluated:Patient Re-evaluated prior to induction Oxygen Delivery Method: Circle system utilized Preoxygenation: Pre-oxygenation with 100% oxygen Induction Type: IV induction Ventilation: Mask ventilation without difficulty Laryngoscope Size: Mac and 4 Tube type: Oral Number of attempts: 1 Airway Equipment and Method: Stylet and Oral airway Placement Confirmation: ETT inserted through vocal cords under direct vision, positive ETCO2 and breath sounds checked- equal and bilateral Secured at: 22 cm Tube secured with: Tape Dental Injury: Teeth and Oropharynx as per pre-operative assessment

## 2020-12-06 NOTE — ED Notes (Signed)
Dr. Zenia Resides is aware of inability to collect labs or start IV.  IV team consult placed.

## 2020-12-06 NOTE — Evaluation (Signed)
Occupational Therapy Evaluation Patient Details Name: Martha Perkins MRN: [id number] DOB: 09-Nov-1945 Today's Date: 12/06/2020   History of Present Illness Patient is a 75 year old female s/p R reverse total shoulder arthroplasty. PMH includes L shoulder replacement   Clinical Impression   Patient is a 75 year old female s/p shoulder replacement without functional use of right dominant upper extremity secondary to effects of surgery and interscalene block and shoulder precautions. Therapist provided education and instruction to patient and spouse in regards to exercises, precautions, positioning, donning upper extremity clothing and bathing while maintaining shoulder precautions, ice and edema management and donning/doffing sling. Patient and spouse verbalized understanding and demonstrated as needed. Patient needed assistance to donn shirt, underwear, pants, socks and shoes and provided with instruction on compensatory strategies to perform ADLs. Patient to follow up with MD for further therapy needs.        Recommendations for follow up therapy are one component of a multi-disciplinary discharge planning process, led by the attending physician.  Recommendations may be updated based on patient status, additional functional criteria and insurance authorization.   Follow Up Recommendations  Follow surgeon's recommendation for DC plan and follow-up therapies    Equipment Recommendations  None recommended by OT       Precautions / Restrictions Precautions Precautions: Shoulder Type of Shoulder Precautions: If sitting in controlled environment, ok to come out of sling to give neck a break. Please sleep in it to protect until follow up in office. OK to use operative arm for feeding, hygiene and ADLs. Ok to instruct Pendulums and lap slides as exercises. Ok to use operative arm within the following parameters for ADL purposes Ok for PROM, AAROM, AROM within pain tolerance and within the following ROM    ER 20 ABD 45 FE 60. AROM elbow, wrist, hand ok Shoulder Interventions: Shoulder sling/immobilizer;Off for dressing/bathing/exercises Precaution Booklet Issued: Yes (comment) Required Braces or Orthoses: Sling Restrictions Weight Bearing Restrictions: Yes RUE Weight Bearing: Non weight bearing      Mobility Bed Mobility               General bed mobility comments: in chair    Transfers Overall transfer level: Independent Equipment used: None                  Balance Overall balance assessment: Mild deficits observed, not formally tested                                         ADL either performed or assessed with clinical judgement   ADL Overall ADL's : Needs assistance/impaired Eating/Feeding: Independent Eating/Feeding Details (indicate cue type and reason): drink from cup Grooming: Wash/dry hands;Independent;Standing Grooming Details (indicate cue type and reason): wash L hand only due to nerve block Upper Body Bathing: Minimal assistance   Lower Body Bathing: Moderate assistance;Sitting/lateral leans;Sit to/from stand   Upper Body Dressing : Total assistance;Standing Upper Body Dressing Details (indicate cue type and reason): to don adaptive button up shirt Lower Body Dressing: Moderate assistance;Sit to/from stand;Sitting/lateral leans Lower Body Dressing Details (indicate cue type and reason): needs assist to thread L UE and pull up clothing over R hip Toilet Transfer: Independent;Ambulation   Toileting- Clothing Manipulation and Hygiene: Minimal assistance;Sitting/lateral lean;Sit to/from stand       Functional mobility during ADLs: Independent General ADL Comments: patient and spouse educated in compensatory strategies for self care  tasks in order to maintain shoulder precautions      Pertinent Vitals/Pain Pain Assessment: Faces Faces Pain Scale: Hurts a little bit Pain Location: R UE Pain Descriptors / Indicators:  Numbness;Heaviness Pain Intervention(s): Monitored during session     Hand Dominance Right   Extremity/Trunk Assessment Upper Extremity Assessment Upper Extremity Assessment: RUE deficits/detail RUE Deficits / Details: + nerve block   Lower Extremity Assessment Lower Extremity Assessment: Overall WFL for tasks assessed       Communication Communication Communication: No difficulties   Cognition Arousal/Alertness: Awake/alert Behavior During Therapy: WFL for tasks assessed/performed Overall Cognitive Status: Within Functional Limits for tasks assessed                                        Exercises Exercises: Shoulder   Shoulder Instructions Shoulder Instructions Donning/doffing shirt without moving shoulder: Maximal assistance;Caregiver independent with task;Patient able to independently direct caregiver Method for sponge bathing under operated UE: Minimal assistance;Caregiver independent with task;Patient able to independently direct caregiver Donning/doffing sling/immobilizer: Moderate assistance;Caregiver independent with task;Patient able to independently direct caregiver Correct positioning of sling/immobilizer: Minimal assistance;Caregiver independent with task;Patient able to independently direct caregiver Pendulum exercises (written home exercise program): Caregiver independent with task;Patient able to independently direct caregiver ROM for elbow, wrist and digits of operated UE: Caregiver independent with task;Patient able to independently direct caregiver Sling wearing schedule (on at all times/off for ADL's): Caregiver independent with task;Patient able to independently direct caregiver Proper positioning of operated UE when showering: Caregiver independent with task;Patient able to independently direct caregiver Dressing change:  (N/A) Positioning of UE while sleeping: Caregiver independent with task;Patient able to independently direct caregiver     Home Living Family/patient expects to be discharged to:: Private residence Living Arrangements: Spouse/significant other Available Help at Discharge: Family Type of Home: House Home Access: Stairs to enter Technical brewer of Steps: a few         Bathroom Shower/Tub: Occupational psychologist: Handicapped height     Home Equipment: Clinical cytogeneticist - 2 wheels;Cane - single point          Prior Functioning/Environment Level of Independence: Independent                 OT Problem List: Pain;Obesity;Impaired UE functional use;Decreased knowledge of precautions         OT Goals(Current goals can be found in the care plan section) Acute Rehab OT Goals Patient Stated Goal: home OT Goal Formulation: All assessment and education complete, DC therapy   AM-PAC OT "6 Clicks" Daily Activity     Outcome Measure Help from another person eating meals?: None Help from another person taking care of personal grooming?: None Help from another person toileting, which includes using toliet, bedpan, or urinal?: A Little Help from another person bathing (including washing, rinsing, drying)?: A Lot Help from another person to put on and taking off regular upper body clothing?: Total Help from another person to put on and taking off regular lower body clothing?: A Lot 6 Click Score: 16   End of Session Equipment Utilized During Treatment: Other (comment) (sling) Nurse Communication: Other (comment) (OT complete)  Activity Tolerance: Patient tolerated treatment well Patient left: in chair;with call bell/phone within reach;with family/visitor present  OT Visit Diagnosis: Pain Pain - Right/Left: Right Pain - part of body: Shoulder  Time: 0992-7800 OT Time Calculation (min): 32 min Charges:  OT General Charges $OT Visit: 1 Visit OT Evaluation $OT Eval Low Complexity: 1 Low OT Treatments $Self Care/Home Management : 8-22 mins  Delbert Phenix OT OT  pager: [place] 12/06/2020, 12:41 PM

## 2020-12-08 ENCOUNTER — Encounter (HOSPITAL_COMMUNITY): Payer: Self-pay | Admitting: Orthopedic Surgery

## 2020-12-10 ENCOUNTER — Encounter: Payer: Self-pay | Admitting: Family Medicine

## 2020-12-10 ENCOUNTER — Telehealth (INDEPENDENT_AMBULATORY_CARE_PROVIDER_SITE_OTHER): Payer: Medicare Other | Admitting: Family Medicine

## 2020-12-10 VITALS — Temp 100.7°F

## 2020-12-10 DIAGNOSIS — G8918 Other acute postprocedural pain: Secondary | ICD-10-CM | POA: Diagnosis not present

## 2020-12-10 DIAGNOSIS — U071 COVID-19: Secondary | ICD-10-CM

## 2020-12-10 DIAGNOSIS — M25511 Pain in right shoulder: Secondary | ICD-10-CM | POA: Diagnosis not present

## 2020-12-10 DIAGNOSIS — J4521 Mild intermittent asthma with (acute) exacerbation: Secondary | ICD-10-CM | POA: Diagnosis not present

## 2020-12-10 MED ORDER — OXYCODONE-ACETAMINOPHEN 5-325 MG PO TABS: 0.5000 | ORAL_TABLET | Freq: Four times a day (QID) | ORAL | 0 refills | Status: DC | PRN

## 2020-12-10 MED ORDER — ALBUTEROL SULFATE HFA 108 (90 BASE) MCG/ACT IN AERS: INHALATION_SPRAY | RESPIRATORY_TRACT | 2 refills | Status: DC

## 2020-12-10 MED ORDER — NIRMATRELVIR/RITONAVIR (PAXLOVID)TABLET: 3.0000 | ORAL_TABLET | Freq: Two times a day (BID) | ORAL | 0 refills | Status: DC

## 2020-12-10 MED ORDER — BENZONATATE 100 MG PO CAPS: 100.0000 mg | ORAL_CAPSULE | Freq: Two times a day (BID) | ORAL | 0 refills | Status: DC | PRN

## 2020-12-10 NOTE — Progress Notes (Signed)
Virtual Video Visit via MyChart Note  I connected with  Martha Perkins on 12/10/20 at  2:20 PM EDT by the video enabled telemedicine application for MyChart, and verified that I am speaking with the correct person using two identifiers.   I introduced myself as a Designer, jewellery with the practice. We discussed the limitations of evaluation and management by telemedicine and the availability of in person appointments. The patient expressed understanding and agreed to proceed.  Participating parties in this visit include: The patient and the nurse practitioner listed.  The patient is: At home I am: In the office - [facility]  Subjective:    CC:  Chief Complaint  Patient presents with   Cough    HPI: Martha Perkins is a 75 y.o. year old female presenting today via [place] today for COVID symptoms.  Patient had right shoulder replacement last Thursday. When getting into her car to go home she started having significant shortness of breath. They went next door to the ED with CT scan showing atelectasis. She was encouraged to continue incentive spirometry post-operatively, but no other interventions. States the next morning her husband woke up feeling bad and tested positive for COVID. Over the next several days she developed cough with yellow-brown sputum, fatigue, and last night had a fever of 101.9 F. States she has been too tired and out of it to take a COVID test and her husband has feeling too poorly to help her with it.   Reports her nerve block for surgery didn't last past the first day. She only got 10 pain pills. Would like some more. Significant pain at baseline that is worsened with coughing spells.  Past medical history, Surgical history, Family history not pertinant except as noted below, Social history, Allergies, and medications have been entered into the medical record, reviewed, and corrections made.   Review of Systems:  All review of systems negative except  what is listed in the HPI   Objective:    General:  Speaking clearly in complete sentences. Absent shortness of breath noted.   Alert and oriented x3.   Normal judgment.  Absent acute distress.   Impression and Recommendations:    1. Mild intermittent reactive airway disease with acute exacerbation Refilling albuterol inhaler. Continue with spirometry while symptomatic and post-op mobility decreased. She has a pulse ox to monitor her saturation. - albuterol (VENTOLIN HFA) 108 (90 Base) MCG/ACT inhaler; INHALE TWO PUFFS INTO THE LUNGS EVERY 6 HOURS AS NEEDED FOR WHEEZING OR SHORTNESS OF BREATH  Dispense: 8.5 g; Refill: 2  2. COVID-19 High risk for complications. Treating with Paxlovid - instructed to hold Lipitor until after completed. Refilling albuterol and adding Tessalon. Encouraged her to continue incentive spirometry, OTC cold/analgesics, rest, hydration, humidifier use, warm liquids, etc. Patient aware of signs/symptoms requiring further/urgent evaluation. Education attached to AVS.  - nirmatrelvir/ritonavir EUA (PAXLOVID) 20 x 150 MG & 10 x 100MG  TABS; Take 3 tablets by mouth 2 (two) times daily. Use as directed per package insert.  May use any available formulation at pharmacy with the same total dosage.  Dispense: 30 tablet; Refill: 0 - benzonatate (TESSALON) 100 MG capsule; Take 1 capsule (100 mg total) by mouth 2 (two) times daily as needed for cough.  Dispense: 20 capsule; Refill: 0 - albuterol (VENTOLIN HFA) 108 (90 Base) MCG/ACT inhaler; INHALE TWO PUFFS INTO THE LUNGS EVERY 6 HOURS AS NEEDED FOR WHEEZING OR SHORTNESS OF BREATH  Dispense: 8.5 g; Refill: 2  3.  Acute postoperative pain of right shoulder Nerve block did not last over 24 hours and she has finished the post-op pain pills she was given. In significant pain and worsened by all of the coughing. Will give 10 more tablets but that is all for now. Patient agreeable. PDMP reviewed. Monitor for signs of infection. -  oxyCODONE-acetaminophen (PERCOCET) 5-325 MG tablet; Take 0.5-1 tablets by mouth every 6 (six) hours as needed for severe pain.  Dispense: 10 tablet; Refill: 0  Patient aware of signs/symptoms requiring further/urgent evaluation.   Follow-up if symptoms worsen or fail to improve.    I discussed the assessment and treatment plan with the patient. The patient was provided an opportunity to ask questions and all were answered. The patient agreed with the plan and demonstrated an understanding of the instructions.   The patient was advised to call back or seek an in-person evaluation if the symptoms worsen or if the condition fails to improve as anticipated.  I spent 20 minutes dedicated to the care of this patient on the date of this encounter to include pre-visit chart review of prior notes and results, face-to-face time with the patient, and post-visit ordering of testing as indicated.   Terrilyn Saver, NP

## 2020-12-10 NOTE — Patient Instructions (Signed)
Paxlovid patient fact sheet: HotterNames.de   Over the counter medications that may be helpful for symptoms:  Guaifenesin 1200 mg extended release tabs twice daily, with plenty of water For cough and congestion Brand name: Mucinex   Pseudoephedrine 30 mg, one or two tabs every 4 to 6 hours For sinus congestion Brand name: Sudafed You must get this from the pharmacy counter.  Oxymetazoline nasal spray each morning, one spray in each nostril, for NO MORE THAN 3 days  For nasal and sinus congestion Brand name: Afrin Saline nasal spray or Saline Nasal Irrigation 3-5 times a day For nasal and sinus congestion Brand names: Ocean or AYR Fluticasone nasal spray, one spray in each nostril, each morning (after oxymetazoline and saline, if used) For nasal and sinus congestion Brand name: Flonase Warm salt water gargles  For sore throat Every few hours as needed Alternate ibuprofen 400-600 mg and acetaminophen 1000 mg every 4-6 hours For fever, body aches, headache Brand names: Motrin or Advil and Tylenol Dextromethorphan 12-hour cough version 30 mg every 12 hours  For cough Brand name: Delsym Stop all other cold medications for now (Nyquil, Dayquil, Tylenol Cold, Theraflu, etc) and other non-prescription cough/cold preparations. Many of these have the same ingredients listed above and could cause an overdose of medication.   Herbal treatments that have been shown to be helpful in some patients include: Vitamin C 1000mg  per day Vitamin D 4000iU per day Zinc 100mg  per day Quercetin 25-500mg  twice a day Melatonin 5-10mg  at bedtime  General Instructions Allow your body to rest Drink PLENTY of fluids Isolate yourself from everyone, even family, until test results have returned  If your COVID-19 test is positive Then you ARE INFECTED and you can pass the virus to others You must quarantine from others for a minimum of  5 days since symptoms started AND You  are fever free for 24 hours WITHOUT any medication to reduce fever AND Your symptoms are improving Wear mask for the next 5 days If not improved by day 5, quarantine for the full 10 days. Do not go to the store or other public areas Do not go around household members who are not known to be infected with COVID-19 If you MUST leave your area of quarantine (example: go to a bathroom you share with others in your home), you must Wear a mask Wash your hands thoroughly Wipe down any surfaces you touch Do not share food, drinks, towels, or other items with other persons Dispose of your own tissues, food containers, etc  Once you have recovered, please continue good preventive care measures, including:  wearing a mask when in public wash your hands frequently avoid touching your face/nose/eyes cover coughs/sneezes with the inside of your elbow stay out of crowds keep a 6 foot distance from others  If you develop severe shortness of breath, uncontrolled fevers, coughing up blood, confusion, chest pain, or signs of dehydration (such as significantly decreased urine amounts or dizziness with standing) please go to the ER.

## 2020-12-13 ENCOUNTER — Encounter: Payer: Self-pay | Admitting: Family Medicine

## 2020-12-13 NOTE — Telephone Encounter (Signed)
Normally I would recommend doing a virtual but we really do not have anything available today so I would recommend that she do an E-visit online.

## 2020-12-14 ENCOUNTER — Encounter: Payer: Self-pay | Admitting: Family Medicine

## 2020-12-14 ENCOUNTER — Telehealth (INDEPENDENT_AMBULATORY_CARE_PROVIDER_SITE_OTHER): Payer: Medicare Other | Admitting: Family Medicine

## 2020-12-14 DIAGNOSIS — R399 Unspecified symptoms and signs involving the genitourinary system: Secondary | ICD-10-CM | POA: Diagnosis not present

## 2020-12-14 MED ORDER — SULFAMETHOXAZOLE-TRIMETHOPRIM 800-160 MG PO TABS: 1.0000 | ORAL_TABLET | Freq: Two times a day (BID) | ORAL | 0 refills | Status: AC

## 2020-12-14 MED ORDER — PHENAZOPYRIDINE HCL 200 MG PO TABS: 200.0000 mg | ORAL_TABLET | Freq: Three times a day (TID) | ORAL | 0 refills | Status: AC

## 2020-12-14 NOTE — Progress Notes (Signed)
Virtual Video Visit via MyChart Note  I connected with  Martha Perkins on 12/14/20 at  1:00 PM EDT by the video enabled telemedicine application for MyChart, and verified that I am speaking with the correct person using two identifiers.   I introduced myself as a Designer, jewellery with the practice. We discussed the limitations of evaluation and management by telemedicine and the availability of in person appointments. The patient expressed understanding and agreed to proceed.  Participating parties in this visit include: The patient and the nurse practitioner listed.  The patient is: At home I am: In the office - [facility]  Subjective:    CC:  Chief Complaint  Patient presents with   Urinary Tract Infection     HPI: Martha Perkins is a 75 y.o. year old female presenting today via [place] today for UTI symptoms.  Patient is currently quarantined for COVID, but 2 days ago she developed significant dysuria, bladder spasms, chills, increased frequency, urgency, cloudy urine. States she has not had any fevers or noticed any odor changes to her urine. No abdominal, flank, back pain, nausea, vomiting, diarrhea.    Past medical history, Surgical history, Family history not pertinant except as noted below, Social history, Allergies, and medications have been entered into the medical record, reviewed, and corrections made.   Review of Systems:  All review of systems negative except what is listed in the HPI   Objective:    General:  Speaking clearly in complete sentences. Absent shortness of breath noted.   Alert and oriented x3.   Normal judgment.  Absent acute distress.   Impression and Recommendations:    1. UTI symptoms - sulfamethoxazole-trimethoprim (BACTRIM DS) 800-160 MG tablet; Take 1 tablet by mouth 2 (two) times daily for 3 days.  Dispense: 6 tablet; Refill: 0 - phenazopyridine (PYRIDIUM) 200 MG tablet; Take 1 tablet (200 mg total) by mouth 3 (three)  times daily for 2 days.  Dispense: 6 tablet; Refill: 0  Will go ahead and treat empirically since she is quarantined. Adding Bactrim and Pyridium. If still having significant symptoms by Monday, please bring urine sample so we can culture and make any changes if needed. Remain well hydrated and avoid bladder irritants. Patient aware of signs/symptoms requiring further/urgent evaluation.   Follow-up if symptoms worsen or fail to improve.    I discussed the assessment and treatment plan with the patient. The patient was provided an opportunity to ask questions and all were answered. The patient agreed with the plan and demonstrated an understanding of the instructions.   The patient was advised to call back or seek an in-person evaluation if the symptoms worsen or if the condition fails to improve as anticipated.  I spent 20 minutes dedicated to the care of this patient on the date of this encounter to include pre-visit chart review of prior notes and results, face-to-face time with the patient, and post-visit ordering of testing as indicated.   Terrilyn Saver, NP

## 2020-12-17 DIAGNOSIS — Z96611 Presence of right artificial shoulder joint: Secondary | ICD-10-CM | POA: Diagnosis not present

## 2020-12-21 ENCOUNTER — Other Ambulatory Visit: Payer: Self-pay | Admitting: Family Medicine

## 2020-12-21 DIAGNOSIS — M7989 Other specified soft tissue disorders: Secondary | ICD-10-CM

## 2020-12-27 ENCOUNTER — Other Ambulatory Visit: Payer: Self-pay | Admitting: Family Medicine

## 2020-12-27 ENCOUNTER — Ambulatory Visit: Payer: Medicare Other | Attending: Orthopedic Surgery | Admitting: Physical Therapy

## 2020-12-27 DIAGNOSIS — G8929 Other chronic pain: Secondary | ICD-10-CM

## 2020-12-27 DIAGNOSIS — M5136 Other intervertebral disc degeneration, lumbar region: Secondary | ICD-10-CM

## 2020-12-27 DIAGNOSIS — M25511 Pain in right shoulder: Secondary | ICD-10-CM | POA: Diagnosis not present

## 2020-12-27 DIAGNOSIS — M25611 Stiffness of right shoulder, not elsewhere classified: Secondary | ICD-10-CM

## 2020-12-27 DIAGNOSIS — R600 Localized edema: Secondary | ICD-10-CM | POA: Diagnosis not present

## 2020-12-27 NOTE — Therapy (Signed)
[facility] [place], [place], [address] Phone: [phone]   Fax:  [phone]  Physical Therapy Evaluation  Patient Details  Name: Martha Perkins MRN: [id number] Date of Birth: 1945/04/03 Referring Provider (PT): Justice Britain MD   Encounter Date: 12/27/2020   PT End of Session - 12/27/20 1549     Visit Number 1    Number of Visits 8    Date for PT Re-Evaluation 01/24/21    Authorization Type FOTO AT LEAST EVERY 5TH VISIT.  PROGRESS NOTE AT 10TH VISIT.  KX MODIFIER AFTER 15 VISITS.    PT Start Time 0314    PT Stop Time 0350    PT Time Calculation (min) 36 min    Activity Tolerance Patient tolerated treatment well    Behavior During Therapy [facility] for tasks assessed/performed             Past Medical History:  Diagnosis Date   Arthritis    neck, back, hips, knees, ankles   Asthma    use everyday   Cancer ([place]) 2019   skin   Dyspnea    Dysrhythmia    PVCs , Afib   Heart murmur    History of kidney stones    Hyperlipidemia    Hypertension    Hyperthyroidism 08/08/2017   radio active iodine x2   Hypothyroidism    now take synthroid   Paroxysmal atrial fibrillation ([place]) 08/09/2015   PONV (postoperative nausea and vomiting)     Past Surgical History:  Procedure Laterality Date   CARDIOVERSION N/A 08/05/2016   Procedure: CARDIOVERSION;  Surgeon: Skeet Latch, MD;  Location: [facility];  Service: Cardiovascular;  Laterality: N/A;   CATARACT EXTRACTION Left 09/2012   Dr. Bunnie Philips   LITHOTRIPSY     REPLACEMENT TOTAL KNEE BILATERAL  2011   REVERSE SHOULDER ARTHROPLASTY Left 07/19/2020   Procedure: REVERSE SHOULDER ARTHROPLASTY;  Surgeon: Justice Britain, MD;  Location: [facility];  Service: Orthopedics;  Laterality: Left;  168min   REVERSE SHOULDER ARTHROPLASTY Right 12/06/2020   Procedure: REVERSE SHOULDER ARTHROPLASTY;  Surgeon: Justice Britain, MD;  Location: [facility];  Service: Orthopedics;  Laterality:  Right;  185min   THYROIDECTOMY  1971   TUBAL LIGATION  1979    There were no vitals filed for this visit.    Subjective Assessment - 12/27/20 1537     Subjective COVID-19 screen performed prior to patient entering clinic.  The patient presents to the clinic today s/p right reverse total shoulder replacement.  She states she had some difficulties having gotten COVID and a UTI.  She really hasn't been able to do her HEP other than the pendulum at this time.  She also reports that when she ully extends her right elbow she experiences a shooting/shocking pain into her right wrist and hand.  Her resting pain-level today is a 5/10.  She is keeping her arm flexed and by her abdomen as she forgot her sling.    Pertinent History HTN, thyroid problems, bilateral TKA's, left TSA.    Patient Stated Goals Use right UE with minimal pain.    Currently in Pain? Yes    Pain Score 5     Pain Location Shoulder    Pain Orientation Right    Pain Descriptors / Indicators Aching    Pain Type Surgical pain;Chronic pain    Pain Onset More than a month ago    Pain Frequency Constant    Aggravating Factors  Movement.  Pain Relieving Factors Pain medication.                [facility] PT Assessment - 12/27/20 0001       Assessment   Medical Diagnosis Reverse left total shoulder replacement    Referring Provider (PT) Justice Britain MD    Onset Date/Surgical Date --   12/06/20 (surgery date).     Precautions   Precaution Comments No ultrasound.  Per protocol.      Restrictions   Other Position/Activity Restrictions No UE weight bearing.      Balance Screen   Has the patient fallen in the past 6 months No    Has the patient had a decrease in activity level because of a fear of falling?  No    Is the patient reluctant to leave their home because of a fear of falling?  No      Home Environment   Living Environment Private residence      Prior Function   Level of Independence Independent       Observation/Other Assessments   Observations Right shoulder incision appears to be healing well.  Min+ edema noted.    Focus on Therapeutic Outcomes (FOTO)  Complete.      ROM / Strength   AROM / PROM / Strength PROM      PROM   Overall PROM Comments In supine:  P/AROM into right shoulder flexion to 78 degrees and ER to 0 degrees.      Palpation   Palpation comment Diffuse anterior right shoulder tenderness.                        Objective measurements completed on examination: See above findings.       [facility] Adult PT Treatment/Exercise - 12/27/20 0001       Modalities   Modalities Vasopneumatic      Vasopneumatic   Number Minutes Vasopneumatic  10 minutes    Vasopnuematic Location  --   RT shoulder with pillow between thorax and right elbow.                         PT Long Term Goals - 12/27/20 1728       PT LONG TERM GOAL #1   Title Independent with a HEP.    Time 4    Period Weeks    Status New      PT LONG TERM GOAL #2   Title Active right shoulder flexion to 135 degrees so the patient can easily reach overhead.    Time 4    Period Weeks    Status New      PT LONG TERM GOAL #3   Title Active ER to 60 degrees+ to allow for easily donning/doffing of apparel.    Time 4    Period Weeks    Status New      PT LONG TERM GOAL #4   Title Increase right shoulder strength to a solid 4/5 to increase stability for performance of functional activities.    Time 4    Period Weeks    Status New      PT LONG TERM GOAL #5   Title Perform ADL's with pain not > 3/10.    Time 4    Period Weeks    Status New                    Plan -  12/27/20 1603     Comorbidities HTN, thyroid problems, bilateral TKA's, left TSA.    Examination-Activity Limitations Other;Reach Overhead    Examination-Participation Restrictions Other    Stability/Clinical Decision Making Stable/Uncomplicated    Clinical Decision Making Low    Rehab  Potential Good    PT Frequency 2x / week    PT Duration 4 weeks    PT Treatment/Interventions ADLs/Self Care Home Management;Cryotherapy;Electrical Stimulation;Therapeutic activities;Therapeutic exercise;Neuromuscular re-education;Manual techniques;Patient/family education;Passive range of motion;Vasopneumatic Device             Patient will benefit from skilled therapeutic intervention in order to improve the following deficits and impairments:  Pain, Decreased activity tolerance, Increased edema, Decreased range of motion  Visit Diagnosis: Chronic right shoulder pain - Plan: PT plan of care cert/re-cert  Stiffness of right shoulder, not elsewhere classified - Plan: PT plan of care cert/re-cert  Localized edema - Plan: PT plan of care cert/re-cert     Problem List Patient Active Problem List   Diagnosis Date Noted   Venous insufficiency of both lower extremities 10/03/2020   S/P reverse total shoulder arthroplasty, 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   Aortic atherosclerosis ([place]) 12/26/2019   Palpitations 10/21/2019   Morbid obesity ([place]) 10/21/2019   Encounter for chronic pain management 06/30/2019   Hypothyroidism 09/11/2016   History of atrial fibrillation    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/14/2016   Restrictive lung disease 09/28/2015   Lumbar degenerative disc disease 08/15/2015   PAF (paroxysmal atrial fibrillation) ([place]) 08/09/2015   Chronic allergic rhinitis 05/17/2014   Cataract 06/03/2012   Hyperlipidemia LDL goal <100 12/21/2010   Essential hypertension 12/21/2010   GERD (gastroesophageal reflux disease) 07/18/2010   Insulin resistance 07/18/2010   Fatty liver 07/18/2010    Deolinda Frid, [place], PT 12/27/2020, 5:40 PM  [facility] [address] [place], [place], [address] Phone: [phone]   Fax:  [phone]  Name: CHRISTINNA SPRUNG MRN: [id number] Date of Birth: 1945/05/09

## 2020-12-28 ENCOUNTER — Other Ambulatory Visit: Payer: Self-pay | Admitting: Family Medicine

## 2020-12-28 DIAGNOSIS — G8929 Other chronic pain: Secondary | ICD-10-CM

## 2020-12-28 DIAGNOSIS — M5136 Other intervertebral disc degeneration, lumbar region: Secondary | ICD-10-CM

## 2020-12-28 NOTE — Telephone Encounter (Signed)
[facility] unable to fill tramadol rx. Requesting for provider to "Prescription MME cannot be calculated for this prescription. Enter discrete sig details to calculate prescription MME".

## 2020-12-31 ENCOUNTER — Other Ambulatory Visit: Payer: Self-pay

## 2020-12-31 ENCOUNTER — Encounter: Payer: Self-pay | Admitting: Physical Therapy

## 2020-12-31 ENCOUNTER — Ambulatory Visit: Payer: Medicare Other | Admitting: Physical Therapy

## 2020-12-31 DIAGNOSIS — M25611 Stiffness of right shoulder, not elsewhere classified: Secondary | ICD-10-CM | POA: Diagnosis not present

## 2020-12-31 DIAGNOSIS — R600 Localized edema: Secondary | ICD-10-CM

## 2020-12-31 DIAGNOSIS — M25511 Pain in right shoulder: Secondary | ICD-10-CM | POA: Diagnosis not present

## 2020-12-31 DIAGNOSIS — G8929 Other chronic pain: Secondary | ICD-10-CM | POA: Diagnosis not present

## 2020-12-31 NOTE — Therapy (Signed)
[facility] [place], [place], [address] Phone: [phone]   Fax:  [phone]  Physical Therapy Treatment  Patient Details  Name: Martha Perkins MRN: [id number] Date of Birth: 1945/05/06 Referring Provider (PT): Justice Britain MD   Encounter Date: 12/31/2020   PT End of Session - 12/31/20 1032     Visit Number 2    Number of Visits 8    Date for PT Re-Evaluation 01/24/21    Authorization Type FOTO AT LEAST EVERY 5TH VISIT.  PROGRESS NOTE AT 10TH VISIT.  KX MODIFIER AFTER 15 VISITS.    PT Start Time 1032    PT Stop Time 1121    PT Time Calculation (min) 49 min    Activity Tolerance Patient tolerated treatment well    Behavior During Therapy WFL for tasks assessed/performed             Past Medical History:  Diagnosis Date   Arthritis    neck, back, hips, knees, ankles   Asthma    use everyday   Cancer ([place]) 2019   skin   Dyspnea    Dysrhythmia    PVCs , Afib   Heart murmur    History of kidney stones    Hyperlipidemia    Hypertension    Hyperthyroidism 08/08/2017   radio active iodine x2   Hypothyroidism    now take synthroid   Paroxysmal atrial fibrillation ([place]) 08/09/2015   PONV (postoperative nausea and vomiting)     Past Surgical History:  Procedure Laterality Date   CARDIOVERSION N/A 08/05/2016   Procedure: CARDIOVERSION;  Surgeon: Skeet Latch, MD;  Location: [facility];  Service: Cardiovascular;  Laterality: N/A;   CATARACT EXTRACTION Left 09/2012   Dr. Bunnie Philips   LITHOTRIPSY     REPLACEMENT TOTAL KNEE BILATERAL  2011   REVERSE SHOULDER ARTHROPLASTY Left 07/19/2020   Procedure: REVERSE SHOULDER ARTHROPLASTY;  Surgeon: Justice Britain, MD;  Location: [facility];  Service: Orthopedics;  Laterality: Left;  130min   REVERSE SHOULDER ARTHROPLASTY Right 12/06/2020   Procedure: REVERSE SHOULDER ARTHROPLASTY;  Surgeon: Justice Britain, MD;  Location: [facility];  Service: Orthopedics;  Laterality:  Right;  18min   THYROIDECTOMY  1971   TUBAL LIGATION  1979    There were no vitals filed for this visit.   Subjective Assessment - 12/31/20 1106     Subjective Forgot the exercises that she was instructed in during evaluation.    Pertinent History HTN, thyroid problems, bilateral TKA's, left TSA.    Patient Stated Goals Use right UE with minimal pain.    Currently in Pain? Yes    Pain Score --   no rating provided   Pain Location Shoulder    Pain Orientation Right    Pain Descriptors / Indicators Discomfort    Pain Type Surgical pain    Pain Onset More than a month ago    Pain Frequency Constant                [facility] PT Assessment - 12/31/20 0001       Assessment   Medical Diagnosis Reverse left total shoulder replacement    Referring Provider (PT) Justice Britain MD    Onset Date/Surgical Date 12/06/20      Precautions   Precaution Comments No ultrasound.  Per protocol.                           [facility] Adult PT  Treatment/Exercise - 12/31/20 0001       Modalities   Modalities Vasopneumatic      Vasopneumatic   Number Minutes Vasopneumatic  10 minutes    Vasopnuematic Location  Shoulder    Vasopneumatic Pressure Low    Vasopneumatic Temperature  34/pain      Manual Therapy   Manual Therapy Passive ROM    Passive ROM PROM of R shoulder into flexion, ER/IR with gentle holds at end range                          PT Long Term Goals - 12/27/20 1728       PT LONG TERM GOAL #1   Title Independent with a HEP.    Time 4    Period Weeks    Status New      PT LONG TERM GOAL #2   Title Active right shoulder flexion to 135 degrees so the patient can easily reach overhead.    Time 4    Period Weeks    Status New      PT LONG TERM GOAL #3   Title Active ER to 60 degrees+ to allow for easily donning/doffing of apparel.    Time 4    Period Weeks    Status New      PT LONG TERM GOAL #4   Title Increase right shoulder strength  to a solid 4/5 to increase stability for performance of functional activities.    Time 4    Period Weeks    Status New      PT LONG TERM GOAL #5   Title Perform ADL's with pain not > 3/10.    Time 4    Period Weeks    Status New                   Plan - 12/31/20 1130     Clinical Impression Statement Patient presented in clinic with sling donned. Patient able to tolerate PROM of R shoulder well with no complaints of pain. Intermittant oscillations provided during PROM to reduce guarding and discomfort. Patient was re-educated regarding the HEP from evaluation of table slides, pendulums, and AAROM ER. Normal vasopneumatic response noted following removal of the modality.    Personal Factors and Comorbidities Comorbidity 1    Comorbidities HTN, thyroid problems, bilateral TKA's, left TSA.    Examination-Activity Limitations Other;Reach Overhead    Examination-Participation Restrictions Other    Stability/Clinical Decision Making Stable/Uncomplicated    Rehab Potential Good    PT Frequency 2x / week    PT Duration 4 weeks    PT Treatment/Interventions ADLs/Self Care Home Management;Cryotherapy;Electrical Stimulation;Therapeutic activities;Therapeutic exercise;Neuromuscular re-education;Manual techniques;Patient/family education;Passive range of motion;Vasopneumatic Device    PT Next Visit Plan Progress per protocol.  Vasopneumatic to right shoulder with pillow between thorax and right elbow.    Consulted and Agree with Plan of Care Patient             Patient will benefit from skilled therapeutic intervention in order to improve the following deficits and impairments:  Pain, Decreased activity tolerance, Increased edema, Decreased range of motion  Visit Diagnosis: Chronic right shoulder pain  Stiffness of right shoulder, not elsewhere classified  Localized edema     Problem List Patient Active Problem List   Diagnosis Date Noted   Venous insufficiency of both  lower extremities 10/03/2020   S/P reverse total shoulder arthroplasty, 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   Aortic atherosclerosis ([place])  12/26/2019   Palpitations 10/21/2019   Morbid obesity ([place]) 10/21/2019   Encounter for chronic pain management 06/30/2019   Hypothyroidism 09/11/2016   History of atrial fibrillation    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/14/2016   Restrictive lung disease 09/28/2015   Lumbar degenerative disc disease 08/15/2015   PAF (paroxysmal atrial fibrillation) ([place]) 08/09/2015   Chronic allergic rhinitis 05/17/2014   Cataract 06/03/2012   Hyperlipidemia LDL goal <100 12/21/2010   Essential hypertension 12/21/2010   GERD (gastroesophageal reflux disease) 07/18/2010   Insulin resistance 07/18/2010   Fatty liver 07/18/2010    Standley Brooking, PTA 12/31/2020, 11:33 AM  [facility] [address] [place], [place], [address] Phone: [phone]   Fax:  [phone]  Name: Martha Perkins MRN: [id number] Date of Birth: 01/06/1946

## 2021-01-04 ENCOUNTER — Ambulatory Visit: Payer: Medicare Other | Admitting: Physical Therapy

## 2021-01-04 ENCOUNTER — Encounter (HOSPITAL_COMMUNITY): Payer: Self-pay | Admitting: Radiology

## 2021-01-04 ENCOUNTER — Other Ambulatory Visit: Payer: Self-pay

## 2021-01-04 ENCOUNTER — Encounter: Payer: Self-pay | Admitting: Physical Therapy

## 2021-01-04 DIAGNOSIS — M25611 Stiffness of right shoulder, not elsewhere classified: Secondary | ICD-10-CM

## 2021-01-04 DIAGNOSIS — G8929 Other chronic pain: Secondary | ICD-10-CM

## 2021-01-04 DIAGNOSIS — M25511 Pain in right shoulder: Secondary | ICD-10-CM

## 2021-01-04 DIAGNOSIS — R600 Localized edema: Secondary | ICD-10-CM

## 2021-01-04 NOTE — Therapy (Signed)
[facility] [place], [place], [address] Phone: [phone]   Fax:  [phone]  Physical Therapy Treatment  Patient Details  Name: Martha Perkins MRN: [id number] Date of Birth: 1945/06/25 Referring Provider (PT): Justice Britain MD   Encounter Date: 01/04/2021   PT End of Session - 01/04/21 1038     Visit Number 3    Number of Visits 8    Date for PT Re-Evaluation 01/24/21    Authorization Type FOTO AT LEAST EVERY 5TH VISIT.  PROGRESS NOTE AT 10TH VISIT.  KX MODIFIER AFTER 15 VISITS.    PT Start Time 1038    PT Stop Time 1118    PT Time Calculation (min) 40 min    Activity Tolerance Patient tolerated treatment well    Behavior During Therapy WFL for tasks assessed/performed             Past Medical History:  Diagnosis Date   Arthritis    neck, back, hips, knees, ankles   Asthma    use everyday   Cancer ([place]) 2019   skin   Dyspnea    Dysrhythmia    PVCs , Afib   Heart murmur    History of kidney stones    Hyperlipidemia    Hypertension    Hyperthyroidism 08/08/2017   radio active iodine x2   Hypothyroidism    now take synthroid   Paroxysmal atrial fibrillation ([place]) 08/09/2015   PONV (postoperative nausea and vomiting)     Past Surgical History:  Procedure Laterality Date   CARDIOVERSION N/A 08/05/2016   Procedure: CARDIOVERSION;  Surgeon: Skeet Latch, MD;  Location: [facility];  Service: Cardiovascular;  Laterality: N/A;   CATARACT EXTRACTION Left 09/2012   Dr. Bunnie Philips   LITHOTRIPSY     REPLACEMENT TOTAL KNEE BILATERAL  2011   REVERSE SHOULDER ARTHROPLASTY Left 07/19/2020   Procedure: REVERSE SHOULDER ARTHROPLASTY;  Surgeon: Justice Britain, MD;  Location: [facility];  Service: Orthopedics;  Laterality: Left;  166min   REVERSE SHOULDER ARTHROPLASTY Right 12/06/2020   Procedure: REVERSE SHOULDER ARTHROPLASTY;  Surgeon: Justice Britain, MD;  Location[facility];  Service: Orthopedics;  Laterality:  Right;  137min   THYROIDECTOMY  1971   TUBAL LIGATION  1979    There were no vitals filed for this visit.   Subjective Assessment - 01/04/21 1037     Subjective Better today but intermittantly does something that causes pain.    Pertinent History HTN, thyroid problems, bilateral TKA's, left TSA.    Patient Stated Goals Use right UE with minimal pain.    Currently in Pain? Yes    Pain Score 3     Pain Location Shoulder    Pain Orientation Right    Pain Descriptors / Indicators Sore;Aching    Pain Type Surgical pain    Pain Onset More than a month ago    Pain Frequency Intermittent                [facility] PT Assessment - 01/04/21 0001       Assessment   Medical Diagnosis Reverse left total shoulder replacement    Referring Provider (PT) Justice Britain MD    Onset Date/Surgical Date 12/06/20    Hand Dominance Right      Precautions   Precaution Comments No ultrasound.  Per protocol.                           [facility]  Adult PT Treatment/Exercise - 01/04/21 0001       Modalities   Modalities Vasopneumatic      Vasopneumatic   Number Minutes Vasopneumatic  10 minutes    Vasopnuematic Location  Shoulder    Vasopneumatic Pressure Low    Vasopneumatic Temperature  34/pain      Manual Therapy   Manual Therapy Passive ROM    Passive ROM PROM of R shoulder into flexion, ER/IR with gentle holds at end range                          PT Long Term Goals - 12/27/20 1728       PT LONG TERM GOAL #1   Title Independent with a HEP.    Time 4    Period Weeks    Status New      PT LONG TERM GOAL #2   Title Active right shoulder flexion to 135 degrees so the patient can easily reach overhead.    Time 4    Period Weeks    Status New      PT LONG TERM GOAL #3   Title Active ER to 60 degrees+ to allow for easily donning/doffing of apparel.    Time 4    Period Weeks    Status New      PT LONG TERM GOAL #4   Title Increase right shoulder  strength to a solid 4/5 to increase stability for performance of functional activities.    Time 4    Period Weeks    Status New      PT LONG TERM GOAL #5   Title Perform ADL's with pain not > 3/10.    Time 4    Period Weeks    Status New                   Plan - 01/04/21 1126     Clinical Impression Statement Patient presented in clinic with reports of minimal ache in R shoulder. Patient remains cautious with use of RUE but intermittantly has pain as she is RUE dominant. Firm end feels and smooth arc of motion noted during PROM session of R shoulder. Normal vasopneumatic response noted following removal of the modality.    Personal Factors and Comorbidities Comorbidity 1    Comorbidities HTN, thyroid problems, bilateral TKA's, left TSA.    Examination-Activity Limitations Other;Reach Overhead    Examination-Participation Restrictions Other    Stability/Clinical Decision Making Stable/Uncomplicated    Rehab Potential Good    PT Frequency 2x / week    PT Duration 4 weeks    PT Treatment/Interventions ADLs/Self Care Home Management;Cryotherapy;Electrical Stimulation;Therapeutic activities;Therapeutic exercise;Neuromuscular re-education;Manual techniques;Patient/family education;Passive range of motion;Vasopneumatic Device    PT Next Visit Plan Progress per protocol.  Vasopneumatic to right shoulder with pillow between thorax and right elbow.    Consulted and Agree with Plan of Care Patient             Patient will benefit from skilled therapeutic intervention in order to improve the following deficits and impairments:  Pain, Decreased activity tolerance, Increased edema, Decreased range of motion  Visit Diagnosis: Chronic right shoulder pain  Stiffness of right shoulder, not elsewhere classified  Localized edema     Problem List Patient Active Problem List   Diagnosis Date Noted   Venous insufficiency of both lower extremities 10/03/2020   S/P reverse total  shoulder arthroplasty, 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   Aortic atherosclerosis ([place]) 12/26/2019  Palpitations 10/21/2019   Morbid obesity ([place]) 10/21/2019   Encounter for chronic pain management 06/30/2019   Hypothyroidism 09/11/2016   History of atrial fibrillation    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/14/2016   Restrictive lung disease 09/28/2015   Lumbar degenerative disc disease 08/15/2015   PAF (paroxysmal atrial fibrillation) ([place]) 08/09/2015   Chronic allergic rhinitis 05/17/2014   Cataract 06/03/2012   Hyperlipidemia LDL goal <100 12/21/2010   Essential hypertension 12/21/2010   GERD (gastroesophageal reflux disease) 07/18/2010   Insulin resistance 07/18/2010   Fatty liver 07/18/2010    Standley Brooking, PTA 01/04/2021, 11:32 AM  [facility] [address] [place], [place], [address] Phone: [phone]   Fax:  [phone]  Name: Martha Perkins MRN: [id number] Date of Birth: 09-30-1945

## 2021-01-07 ENCOUNTER — Encounter: Payer: Self-pay | Admitting: Physical Therapy

## 2021-01-07 ENCOUNTER — Other Ambulatory Visit: Payer: Self-pay

## 2021-01-07 ENCOUNTER — Ambulatory Visit: Payer: Medicare Other | Admitting: Physical Therapy

## 2021-01-07 DIAGNOSIS — R600 Localized edema: Secondary | ICD-10-CM | POA: Diagnosis not present

## 2021-01-07 DIAGNOSIS — G8929 Other chronic pain: Secondary | ICD-10-CM

## 2021-01-07 DIAGNOSIS — M25611 Stiffness of right shoulder, not elsewhere classified: Secondary | ICD-10-CM

## 2021-01-07 DIAGNOSIS — M25511 Pain in right shoulder: Secondary | ICD-10-CM | POA: Diagnosis not present

## 2021-01-07 NOTE — Therapy (Signed)
[facility] [place], [place], [address] Phone: [phone]   Fax:  [phone]  Physical Therapy Treatment  Patient Details  Name: Martha Perkins MRN: [id number] Date of Birth: Oct 14, 1945 Referring Provider (PT): Justice Britain MD   Encounter Date: 01/07/2021   PT End of Session - 01/07/21 0946     Visit Number 4    Number of Visits 8    Date for PT Re-Evaluation 01/24/21    Authorization Type FOTO AT LEAST EVERY 5TH VISIT.  PROGRESS NOTE AT 10TH VISIT.  KX MODIFIER AFTER 15 VISITS.    PT Start Time [phone]    PT Stop Time 1034    PT Time Calculation (min) 42 min    Activity Tolerance Patient tolerated treatment well    Behavior During Therapy WFL for tasks assessed/performed             Past Medical History:  Diagnosis Date   Arthritis    neck, back, hips, knees, ankles   Asthma    use everyday   Cancer ([place]) 2019   skin   Dyspnea    Dysrhythmia    PVCs , Afib   Heart murmur    History of kidney stones    Hyperlipidemia    Hypertension    Hyperthyroidism 08/08/2017   radio active iodine x2   Hypothyroidism    now take synthroid   Paroxysmal atrial fibrillation ([place]) 08/09/2015   PONV (postoperative nausea and vomiting)     Past Surgical History:  Procedure Laterality Date   CARDIOVERSION N/A 08/05/2016   Procedure: CARDIOVERSION;  Surgeon: Skeet Latch, MD;  Location: [facility];  Service: Cardiovascular;  Laterality: N/A;   CATARACT EXTRACTION Left 09/2012   Dr. Bunnie Philips   LITHOTRIPSY     REPLACEMENT TOTAL KNEE BILATERAL  2011   REVERSE SHOULDER ARTHROPLASTY Left 07/19/2020   Procedure: REVERSE SHOULDER ARTHROPLASTY;  Surgeon: Justice Britain, MD;  Location: [facility];  Service: Orthopedics;  Laterality: Left;  135min   REVERSE SHOULDER ARTHROPLASTY Right 12/06/2020   Procedure: REVERSE SHOULDER ARTHROPLASTY;  Surgeon: Justice Britain, MD;  Location[facility];  Service: Orthopedics;  Laterality:  Right;  161min   THYROIDECTOMY  1971   TUBAL LIGATION  1979    There were no vitals filed for this visit.   Subjective Assessment - 01/07/21 0946     Subjective Reports that she has been having an ache on top of her forearm.    Pertinent History HTN, thyroid problems, bilateral TKA's, left TSA.    Patient Stated Goals Use right UE with minimal pain.    Currently in Pain? Other (Comment)   No pain assessment provided               [facility] PT Assessment - 01/07/21 0001       Assessment   Medical Diagnosis Reverse left total shoulder replacement    Referring Provider (PT) Justice Britain MD    Onset Date/Surgical Date 12/06/20    Hand Dominance Right      Precautions   Precaution Comments No ultrasound.  Per protocol.                           [facility] Adult PT Treatment/Exercise - 01/07/21 0001       Modalities   Modalities Vasopneumatic      Vasopneumatic   Number Minutes Vasopneumatic  15 minutes    Vasopnuematic Location  Shoulder  Vasopneumatic Pressure Low    Vasopneumatic Temperature  34/pain      Manual Therapy   Manual Therapy Passive ROM    Passive ROM PROM of R shoulder into flexion, ER/IR with gentle holds at end range                          PT Long Term Goals - 12/27/20 1728       PT LONG TERM GOAL #1   Title Independent with a HEP.    Time 4    Period Weeks    Status New      PT LONG TERM GOAL #2   Title Active right shoulder flexion to 135 degrees so the patient can easily reach overhead.    Time 4    Period Weeks    Status New      PT LONG TERM GOAL #3   Title Active ER to 60 degrees+ to allow for easily donning/doffing of apparel.    Time 4    Period Weeks    Status New      PT LONG TERM GOAL #4   Title Increase right shoulder strength to a solid 4/5 to increase stability for performance of functional activities.    Time 4    Period Weeks    Status New      PT LONG TERM GOAL #5   Title Perform  ADL's with pain not > 3/10.    Time 4    Period Weeks    Status New                   Plan - 01/07/21 1045     Clinical Impression Statement Patient presented in clinic with no new complaints. Patient able to tolerate all PROM with increased holds at end range. Patient also indicated that she had experienced an aching sensation of the R posterior forearm recently. Firm end feels and smooth arc of motion noted during PROM of R shoulder. Normal vasopneumatic response noted following removal of the modality.    Personal Factors and Comorbidities Comorbidity 1    Comorbidities HTN, thyroid problems, bilateral TKA's, left TSA.    Examination-Activity Limitations Other;Reach Overhead    Examination-Participation Restrictions Other    Stability/Clinical Decision Making Stable/Uncomplicated    Rehab Potential Good    PT Frequency 2x / week    PT Duration 4 weeks    PT Treatment/Interventions ADLs/Self Care Home Management;Cryotherapy;Electrical Stimulation;Therapeutic activities;Therapeutic exercise;Neuromuscular re-education;Manual techniques;Patient/family education;Passive range of motion;Vasopneumatic Device    PT Next Visit Plan Progress per protocol.  Vasopneumatic to right shoulder with pillow between thorax and right elbow.    Consulted and Agree with Plan of Care Patient             Patient will benefit from skilled therapeutic intervention in order to improve the following deficits and impairments:  Pain, Decreased activity tolerance, Increased edema, Decreased range of motion  Visit Diagnosis: Chronic right shoulder pain  Stiffness of right shoulder, not elsewhere classified  Localized edema     Problem List Patient Active Problem List   Diagnosis Date Noted   Venous insufficiency of both lower extremities 10/03/2020   S/P reverse total shoulder arthroplasty, 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   Aortic atherosclerosis ([place]) 12/26/2019   Palpitations 10/21/2019   Morbid obesity  ([place]) 10/21/2019   Encounter for chronic pain management 06/30/2019   Hypothyroidism 09/11/2016   History of atrial fibrillation    Primary osteoarthritis of  both shoulders 02/14/2016   Restrictive lung disease 09/28/2015   Lumbar degenerative disc disease 08/15/2015   PAF (paroxysmal atrial fibrillation) ([place]) 08/09/2015   Chronic allergic rhinitis 05/17/2014   Cataract 06/03/2012   Hyperlipidemia LDL goal <100 12/21/2010   Essential hypertension 12/21/2010   GERD (gastroesophageal reflux disease) 07/18/2010   Insulin resistance 07/18/2010   Fatty liver 07/18/2010    Standley Brooking, PTA 01/07/2021, 10:48 AM  [facility] [place], [place], [address] Phone: [phone]   Fax:  [phone]  Name: BRITINY DEFRAIN MRN: [id number] Date of Birth: Jul 10, 1945

## 2021-01-08 ENCOUNTER — Encounter: Payer: Medicare Other | Admitting: Physical Therapy

## 2021-01-10 ENCOUNTER — Encounter: Payer: Self-pay | Admitting: Physical Therapy

## 2021-01-10 ENCOUNTER — Other Ambulatory Visit: Payer: Self-pay

## 2021-01-10 ENCOUNTER — Ambulatory Visit: Payer: Medicare Other | Attending: Orthopedic Surgery | Admitting: Physical Therapy

## 2021-01-10 DIAGNOSIS — M25611 Stiffness of right shoulder, not elsewhere classified: Secondary | ICD-10-CM | POA: Diagnosis not present

## 2021-01-10 DIAGNOSIS — M25511 Pain in right shoulder: Secondary | ICD-10-CM | POA: Diagnosis not present

## 2021-01-10 DIAGNOSIS — G8929 Other chronic pain: Secondary | ICD-10-CM | POA: Diagnosis not present

## 2021-01-10 DIAGNOSIS — R600 Localized edema: Secondary | ICD-10-CM | POA: Diagnosis not present

## 2021-01-10 NOTE — Therapy (Signed)
[facility] [place], [place], [address] Phone: [phone]   Fax:  [phone]  Physical Therapy Treatment  Patient Details  Name: Martha Perkins MRN: [id number] Date of Birth: [birth year] Referring Provider (PT): Justice Britain MD   Encounter Date: 01/10/2021   PT End of Session - 01/10/21 1031     Visit Number 5    Number of Visits 8    Date for PT Re-Evaluation 01/24/21    Authorization Type FOTO AT LEAST EVERY 5TH VISIT.  PROGRESS NOTE AT 10TH VISIT.  KX MODIFIER AFTER 15 VISITS.    PT Start Time 1032    PT Stop Time 1113    PT Time Calculation (min) 41 min    Activity Tolerance Patient tolerated treatment well    Behavior During Therapy WFL for tasks assessed/performed             Past Medical History:  Diagnosis Date   Arthritis    neck, back, hips, knees, ankles   Asthma    use everyday   Cancer ([place]) 2019   skin   Dyspnea    Dysrhythmia    PVCs , Afib   Heart murmur    History of kidney stones    Hyperlipidemia    Hypertension    Hyperthyroidism 08/08/2017   radio active iodine x2   Hypothyroidism    now take synthroid   Paroxysmal atrial fibrillation ([place]) 08/09/2015   PONV (postoperative nausea and vomiting)     Past Surgical History:  Procedure Laterality Date   CARDIOVERSION N/A 08/05/2016   Procedure: CARDIOVERSION;  Surgeon: Skeet Latch, MD;  Location: [place];  Service: Cardiovascular;  Laterality: N/A;   CATARACT EXTRACTION Left 09/2012   Dr. Bunnie Philips   LITHOTRIPSY     REPLACEMENT TOTAL KNEE BILATERAL  2011   REVERSE SHOULDER ARTHROPLASTY Left 07/19/2020   Procedure: REVERSE SHOULDER ARTHROPLASTY;  Surgeon: Justice Britain, MD;  Location: [facility];  Service: Orthopedics;  Laterality: Left;  166min   REVERSE SHOULDER ARTHROPLASTY Right 12/06/2020   Procedure: REVERSE SHOULDER ARTHROPLASTY;  Surgeon: Justice Britain, MD;  Location: [facility];  Service: Orthopedics;  Laterality:  Right;  12min   THYROIDECTOMY  1971   TUBAL LIGATION  1979    There were no vitals filed for this visit.   Subjective Assessment - 01/10/21 1031     Subjective Reports pain today upon waking. Had burning pain in R axilla last night as well. Having the aching of the anterior R wrist as well along forearm as well.    Pertinent History HTN, thyroid problems, bilateral TKA's, left TSA.    Patient Stated Goals Use right UE with minimal pain.    Currently in Pain? Yes    Pain Score 5     Pain Location Shoulder    Pain Orientation Right    Pain Descriptors / Indicators Discomfort    Pain Type Surgical pain    Pain Onset More than a month ago    Pain Frequency Intermittent                [facility] PT Assessment - 01/10/21 0001       Assessment   Medical Diagnosis Reverse left total shoulder replacement    Referring Provider (PT) Justice Britain MD    Onset Date/Surgical Date 12/06/20    Hand Dominance Right      Precautions   Precaution Comments No ultrasound.  Per protocol.  [facility] Adult PT Treatment/Exercise - 01/10/21 0001       Modalities   Modalities Vasopneumatic      Vasopneumatic   Number Minutes Vasopneumatic  10 minutes    Vasopnuematic Location  Shoulder    Vasopneumatic Pressure Low    Vasopneumatic Temperature  34/pain      Manual Therapy   Manual Therapy Passive ROM    Passive ROM PROM of R shoulder into flexion, ER/IR with gentle holds at end range                          PT Long Term Goals - 12/27/20 1728       PT LONG TERM GOAL #1   Title Independent with a HEP.    Time 4    Period Weeks    Status New      PT LONG TERM GOAL #2   Title Active right shoulder flexion to 135 degrees so the patient can easily reach overhead.    Time 4    Period Weeks    Status New      PT LONG TERM GOAL #3   Title Active ER to 60 degrees+ to allow for easily donning/doffing of apparel.    Time 4     Period Weeks    Status New      PT LONG TERM GOAL #4   Title Increase right shoulder strength to a solid 4/5 to increase stability for performance of functional activities.    Time 4    Period Weeks    Status New      PT LONG TERM GOAL #5   Title Perform ADL's with pain not > 3/10.    Time 4    Period Weeks    Status New                   Plan - 01/10/21 1105     Clinical Impression Statement Patient presented in clinic with reports of more pain and ache of wrist and forearm intermittantly. Patient able to tolerate PROM and holds for longer period of time. No complaints of pain with PROM session. Firm end feels and smooth arc of motion noted during PROM of the L shoulder. Normal vasopnuematic response noted following removal of the modality.    Personal Factors and Comorbidities Comorbidity 1    Comorbidities HTN, thyroid problems, bilateral TKA's, left TSA.    Examination-Activity Limitations Other;Reach Overhead    Examination-Participation Restrictions Other    Stability/Clinical Decision Making Stable/Uncomplicated    Rehab Potential Good    PT Frequency 2x / week    PT Duration 4 weeks    PT Treatment/Interventions ADLs/Self Care Home Management;Cryotherapy;Electrical Stimulation;Therapeutic activities;Therapeutic exercise;Neuromuscular re-education;Manual techniques;Patient/family education;Passive range of motion;Vasopneumatic Device    PT Next Visit Plan Progress per protocol.  Vasopneumatic to right shoulder with pillow between thorax and right elbow.    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   Aortic atherosclerosis ([place]) 12/26/2019   Palpitations 10/21/2019   Morbid obesity ([place]) 10/21/2019   Encounter for chronic pain management 06/30/2019   Hypothyroidism 09/11/2016   History of atrial fibrillation    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/14/2016   Restrictive lung disease 09/28/2015   Lumbar degenerative disc disease 08/15/2015   PAF (paroxysmal atrial fibrillation) ([place]) 08/09/2015   Chronic allergic rhinitis 05/17/2014   Cataract 06/03/2012   Hyperlipidemia LDL goal <100 12/21/2010   Essential hypertension 12/21/2010   GERD (gastroesophageal reflux disease) 07/18/2010   Insulin resistance 07/18/2010   Fatty liver 07/18/2010    Standley Brooking, PTA 01/10/2021, 11:29 AM  [facility] [address] [place], [place], [address] Phone: [phone]   Fax:  [phone]  Name: Martha Perkins MRN: [id number] Date of Birth: 1945/08/29

## 2021-01-11 ENCOUNTER — Encounter: Payer: Medicare Other | Admitting: Physical Therapy

## 2021-01-14 ENCOUNTER — Ambulatory Visit: Payer: Medicare Other | Admitting: Physical Therapy

## 2021-01-14 ENCOUNTER — Other Ambulatory Visit: Payer: Self-pay

## 2021-01-14 DIAGNOSIS — M25511 Pain in right shoulder: Secondary | ICD-10-CM | POA: Diagnosis not present

## 2021-01-14 DIAGNOSIS — M25611 Stiffness of right shoulder, not elsewhere classified: Secondary | ICD-10-CM

## 2021-01-14 DIAGNOSIS — G8929 Other chronic pain: Secondary | ICD-10-CM

## 2021-01-14 DIAGNOSIS — R600 Localized edema: Secondary | ICD-10-CM

## 2021-01-14 NOTE — Therapy (Signed)
[facility] [place], [place], [address] Phone: [phone]   Fax:  [phone]  Physical Therapy Treatment  Patient Details  Name: Martha Perkins MRN: [id number] Date of Birth: 1945-11-11 Referring Provider (PT): Martha Britain MD   Encounter Date: 01/14/2021   PT End of Session - 01/14/21 1109     Visit Number 6    Number of Visits 8    Date for PT Re-Evaluation 01/24/21    Authorization Type FOTO AT LEAST EVERY 5TH VISIT.  PROGRESS NOTE AT 10TH VISIT.  KX MODIFIER AFTER 15 VISITS.    PT Start Time 1032    PT Stop Time 1115    PT Time Calculation (min) 43 min    Activity Tolerance Patient tolerated treatment well    Behavior During Therapy WFL for tasks assessed/performed             Past Medical History:  Diagnosis Date   Arthritis    neck, back, hips, knees, ankles   Asthma    use everyday   Cancer ([place]) 2019   skin   Dyspnea    Dysrhythmia    PVCs , Afib   Heart murmur    History of kidney stones    Hyperlipidemia    Hypertension    Hyperthyroidism 08/08/2017   radio active iodine x2   Hypothyroidism    now take synthroid   Paroxysmal atrial fibrillation ([place]) 08/09/2015   PONV (postoperative nausea and vomiting)     Past Surgical History:  Procedure Laterality Date   CARDIOVERSION N/A 08/05/2016   Procedure: CARDIOVERSION;  Surgeon: Martha Latch, MD;  Location: [facility];  Service: Cardiovascular;  Laterality: N/A;   CATARACT EXTRACTION Left 09/2012   Dr. Bunnie Perkins   LITHOTRIPSY     REPLACEMENT TOTAL KNEE BILATERAL  2011   REVERSE SHOULDER ARTHROPLASTY Left 07/19/2020   Procedure: REVERSE SHOULDER ARTHROPLASTY;  Surgeon: Martha Britain, MD;  [facility];  Service: Orthopedics;  Laterality: Left;  137min   REVERSE SHOULDER ARTHROPLASTY Right 12/06/2020   Procedure: REVERSE SHOULDER ARTHROPLASTY;  Surgeon: Martha Britain, MD;  [facility];  Service: Orthopedics;  Laterality:  Right;  142min   THYROIDECTOMY  1971   TUBAL LIGATION  1979    There were no vitals filed for this visit.   Subjective Assessment - 01/14/21 1104     Subjective COVID-19 screen performed prior to patient entering clinic.  about the same.    Pertinent History HTN, thyroid problems, bilateral TKA's, left TSA.    Patient Stated Goals Use right UE with minimal pain.    Currently in Pain? Yes    Pain Score 5     Pain Location Shoulder    Pain Orientation Right    Pain Descriptors / Indicators Discomfort    Pain Type Surgical pain    Pain Onset More than a month ago                               [facility] Adult PT Treatment/Exercise - 01/14/21 0001       Vasopneumatic   Number Minutes Vasopneumatic  15 minutes    Vasopnuematic Location  --   RT shoulder with pillow between thorax and elbow.   Vasopneumatic Pressure Low      Manual Therapy   Manual Therapy Passive ROM    Passive ROM In supine:  PROM x 23 minutes to patient's right shoulder with  low load lond duration stretching technique utilized.                          PT Long Term Goals - 12/27/20 1728       PT LONG TERM GOAL #1   Title Independent with a HEP.    Time 4    Period Weeks    Status New      PT LONG TERM GOAL #2   Title Active right shoulder flexion to 135 degrees so the patient can easily reach overhead.    Time 4    Period Weeks    Status New      PT LONG TERM GOAL #3   Title Active ER to 60 degrees+ to allow for easily donning/doffing of apparel.    Time 4    Period Weeks    Status New      PT LONG TERM GOAL #4   Title Increase right shoulder strength to a solid 4/5 to increase stability for performance of functional activities.    Time 4    Period Weeks    Status New      PT LONG TERM GOAL #5   Title Perform ADL's with pain not > 3/10.    Time 4    Period Weeks    Status New                   Plan - 01/14/21 1106     Clinical Impression  Statement Patient doing okay today.  She tolerated passive range of motion to her right shoulder without complaint today.    Personal Factors and Comorbidities Comorbidity 1    Comorbidities HTN, thyroid problems, bilateral TKA's, left TSA.    Examination-Activity Limitations Other;Reach Overhead    Examination-Participation Restrictions Other    Stability/Clinical Decision Making Stable/Uncomplicated    Rehab Potential Good    PT Frequency 2x / week    PT Duration 4 weeks    PT Treatment/Interventions ADLs/Self Care Home Management;Cryotherapy;Electrical Stimulation;Therapeutic activities;Therapeutic exercise;Neuromuscular re-education;Manual techniques;Patient/family education;Passive range of motion;Vasopneumatic Device    PT Next Visit Plan Progress per protocol.  Vasopneumatic to right shoulder with pillow between thorax and right elbow.    Consulted and Agree with Plan of Care Patient             Patient will benefit from skilled therapeutic intervention in order to improve the following deficits and impairments:  Pain, Decreased activity tolerance, Increased edema, Decreased range of motion  Visit Diagnosis: Chronic right shoulder pain  Stiffness of right shoulder, not elsewhere classified  Localized edema     Problem List Patient Active Problem List   Diagnosis Date Noted   Venous insufficiency of both lower extremities 10/03/2020   S/P reverse total shoulder arthroplasty, 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   Aortic atherosclerosis ([place]) 12/26/2019   Palpitations 10/21/2019   Morbid obesity ([place]) 10/21/2019   Encounter for chronic pain management 06/30/2019   Hypothyroidism 09/11/2016   History of atrial fibrillation    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/14/2016   Restrictive lung disease 09/28/2015   Lumbar degenerative disc disease 08/15/2015   PAF (paroxysmal atrial fibrillation) ([place]) 08/09/2015   Chronic allergic rhinitis 05/17/2014   Cataract 06/03/2012    Hyperlipidemia LDL goal <100 12/21/2010   Essential hypertension 12/21/2010   GERD (gastroesophageal reflux disease) 07/18/2010   Insulin resistance 07/18/2010   Fatty liver 07/18/2010    Martha Perkins, Martha Perkins, PT 01/14/2021, 11:16 AM  [facility] [place], [place], [address] Phone: [phone]   Fax:  [phone]  Name: Martha Perkins MRN: [id number] Date of Birth: Jul 25, 1945

## 2021-01-15 ENCOUNTER — Ambulatory Visit: Payer: Medicare Other | Admitting: Physician Assistant

## 2021-01-17 ENCOUNTER — Ambulatory Visit: Payer: Medicare Other | Admitting: Physical Therapy

## 2021-01-17 ENCOUNTER — Encounter: Payer: Self-pay | Admitting: Physical Therapy

## 2021-01-17 ENCOUNTER — Other Ambulatory Visit: Payer: Self-pay

## 2021-01-17 DIAGNOSIS — R600 Localized edema: Secondary | ICD-10-CM | POA: Diagnosis not present

## 2021-01-17 DIAGNOSIS — M25611 Stiffness of right shoulder, not elsewhere classified: Secondary | ICD-10-CM

## 2021-01-17 DIAGNOSIS — M25511 Pain in right shoulder: Secondary | ICD-10-CM | POA: Diagnosis not present

## 2021-01-17 DIAGNOSIS — G8929 Other chronic pain: Secondary | ICD-10-CM | POA: Diagnosis not present

## 2021-01-17 NOTE — Therapy (Signed)
[facility] [place], [place], [address] Phone: [phone]   Fax:  [phone]  Physical Therapy Treatment  Patient Details  Name: Martha Perkins MRN: [id number] Date of Birth: 03/31/1945 Referring Provider (PT): Justice Britain MD   Encounter Date: 01/17/2021   PT End of Session - 01/17/21 1033     Visit Number 7    Number of Visits 8    Date for PT Re-Evaluation 01/24/21    Authorization Type FOTO AT LEAST EVERY 5TH VISIT.  PROGRESS NOTE AT 10TH VISIT.  KX MODIFIER AFTER 15 VISITS.    PT Start Time 1033    PT Stop Time 1112    PT Time Calculation (min) 39 min    Activity Tolerance Patient tolerated treatment well    Behavior During Therapy WFL for tasks assessed/performed             Past Medical History:  Diagnosis Date   Arthritis    neck, back, hips, knees, ankles   Asthma    use everyday   Cancer ([place]) 2019   skin   Dyspnea    Dysrhythmia    PVCs , Afib   Heart murmur    History of kidney stones    Hyperlipidemia    Hypertension    Hyperthyroidism 08/08/2017   radio active iodine x2   Hypothyroidism    now take synthroid   Paroxysmal atrial fibrillation ([place]) 08/09/2015   PONV (postoperative nausea and vomiting)     Past Surgical History:  Procedure Laterality Date   CARDIOVERSION N/A 08/05/2016   Procedure: CARDIOVERSION;  Surgeon: Skeet Latch, MD;  Location: [facility];  Service: Cardiovascular;  Laterality: N/A;   CATARACT EXTRACTION Left 09/2012   Dr. Bunnie Philips   LITHOTRIPSY     REPLACEMENT TOTAL KNEE BILATERAL  2011   REVERSE SHOULDER ARTHROPLASTY Left 07/19/2020   Procedure: REVERSE SHOULDER ARTHROPLASTY;  Surgeon: Justice Britain, MD;  [facility];  Service: Orthopedics;  Laterality: Left;  159min   REVERSE SHOULDER ARTHROPLASTY Right 12/06/2020   Procedure: REVERSE SHOULDER ARTHROPLASTY;  Surgeon: Justice Britain, MD;  [facility];  Service: Orthopedics;  Laterality:  Right;  128min   THYROIDECTOMY  1971   TUBAL LIGATION  1979    There were no vitals filed for this visit.   Subjective Assessment - 01/17/21 1031     Subjective COVID-19 screen performed prior to patient entering clinic. More discomfort after MD visit yesterday.    Pertinent History HTN, thyroid problems, bilateral TKA's, left TSA.    Patient Stated Goals Use right UE with minimal pain.    Currently in Pain? Yes    Pain Score 5     Pain Location Shoulder    Pain Orientation Right    Pain Descriptors / Indicators Sore    Pain Type Surgical pain    Pain Onset More than a month ago    Pain Frequency Intermittent                [facility] PT Assessment - 01/17/21 0001       Assessment   Medical Diagnosis Reverse left total shoulder replacement    Referring Provider (PT) Justice Britain MD    Onset Date/Surgical Date 12/06/20    Hand Dominance Right      Precautions   Precaution Comments No ultrasound.  Per protocol.  [facility] Adult PT Treatment/Exercise - 01/17/21 0001       Modalities   Modalities Vasopneumatic      Vasopneumatic   Number Minutes Vasopneumatic  10 minutes    Vasopnuematic Location  Shoulder    Vasopneumatic Pressure Low    Vasopneumatic Temperature  34/pain      Manual Therapy   Manual Therapy Passive ROM    Passive ROM PROM R shoulder with low load lond duration stretching for flexion/Er                          PT Long Term Goals - 12/27/20 1728       PT LONG TERM GOAL #1   Title Independent with a HEP.    Time 4    Period Weeks    Status New      PT LONG TERM GOAL #2   Title Active right shoulder flexion to 135 degrees so the patient can easily reach overhead.    Time 4    Period Weeks    Status New      PT LONG TERM GOAL #3   Title Active ER to 60 degrees+ to allow for easily donning/doffing of apparel.    Time 4    Period Weeks    Status New      PT LONG TERM GOAL #4    Title Increase right shoulder strength to a solid 4/5 to increase stability for performance of functional activities.    Time 4    Period Weeks    Status New      PT LONG TERM GOAL #5   Title Perform ADL's with pain not > 3/10.    Time 4    Period Weeks    Status New                   Plan - 01/17/21 1126     Clinical Impression Statement Patient presented in clinic with some discomfort after mobility in MD office in which the MD was pleased. Patient reported soreness along R axilla. Firm end feels and smooth arc of motion noted during PROM in all directions. Patient to progress to next stage of protocol next week. Normal vasopneumatic response noted following removal of the modality.    Personal Factors and Comorbidities Comorbidity 1    Comorbidities HTN, thyroid problems, bilateral TKA's, left TSA.    Examination-Activity Limitations Other;Reach Overhead    Examination-Participation Restrictions Other    Stability/Clinical Decision Making Stable/Uncomplicated    Rehab Potential Good    PT Frequency 2x / week    PT Duration 4 weeks    PT Treatment/Interventions ADLs/Self Care Home Management;Cryotherapy;Electrical Stimulation;Therapeutic activities;Therapeutic exercise;Neuromuscular re-education;Manual techniques;Patient/family education;Passive range of motion;Vasopneumatic Device    PT Next Visit Plan Progress per protocol.  Vasopneumatic to right shoulder with pillow between thorax and right elbow.    Consulted and Agree with Plan of Care Patient             Patient will benefit from skilled therapeutic intervention in order to improve the following deficits and impairments:  Pain, Decreased activity tolerance, Increased edema, Decreased range of motion  Visit Diagnosis: Stiffness of right shoulder, not elsewhere classified  Chronic right shoulder pain  Localized edema     Problem List Patient Active Problem List   Diagnosis Date Noted   Venous  insufficiency of both lower extremities 10/03/2020   S/P reverse total shoulder arthroplasty, 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  Aortic atherosclerosis ([place]) 12/26/2019   Palpitations 10/21/2019   Morbid obesity ([place]) 10/21/2019   Encounter for chronic pain management 06/30/2019   Hypothyroidism 09/11/2016   History of atrial fibrillation    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/14/2016   Restrictive lung disease 09/28/2015   Lumbar degenerative disc disease 08/15/2015   PAF (paroxysmal atrial fibrillation) ([place]) 08/09/2015   Chronic allergic rhinitis 05/17/2014   Cataract 06/03/2012   Hyperlipidemia LDL goal <100 12/21/2010   Essential hypertension 12/21/2010   GERD (gastroesophageal reflux disease) 07/18/2010   Insulin resistance 07/18/2010   Fatty liver 07/18/2010    Standley Brooking, PTA 01/17/2021, 11:32 AM  [facility] [address] [place], [place], [address] Phone: [phone]   Fax:  [phone]  Name: IYSHA MISHKIN MRN: [id number] Date of Birth: 05-31-1945

## 2021-01-21 ENCOUNTER — Ambulatory Visit: Payer: Medicare Other | Admitting: Physical Therapy

## 2021-01-21 ENCOUNTER — Other Ambulatory Visit: Payer: Self-pay

## 2021-01-21 ENCOUNTER — Encounter: Payer: Self-pay | Admitting: Physical Therapy

## 2021-01-21 DIAGNOSIS — G8929 Other chronic pain: Secondary | ICD-10-CM | POA: Diagnosis not present

## 2021-01-21 DIAGNOSIS — M25611 Stiffness of right shoulder, not elsewhere classified: Secondary | ICD-10-CM

## 2021-01-21 DIAGNOSIS — M25511 Pain in right shoulder: Secondary | ICD-10-CM | POA: Diagnosis not present

## 2021-01-21 DIAGNOSIS — R600 Localized edema: Secondary | ICD-10-CM | POA: Diagnosis not present

## 2021-01-21 NOTE — Addendum Note (Signed)
Addended by: Lukisha Procida, [place] W on: 01/21/2021 11:46 AM   Modules accepted: Orders

## 2021-01-21 NOTE — Therapy (Signed)
[facility] [place], [place], [address] Phone: [phone]   Fax:  [phone]  Physical Therapy Treatment  Patient Details  Name: Martha Perkins MRN: [id number] Date of Birth: September 13, 1945 Referring Provider (PT): Justice Britain MD   Encounter Date: 01/21/2021   PT End of Session - 01/21/21 1118     Visit Number 8    Number of Visits 8    Date for PT Re-Evaluation 01/24/21    Authorization Type FOTO AT LEAST EVERY 5TH VISIT.  PROGRESS NOTE AT 10TH VISIT.  KX MODIFIER AFTER 15 VISITS.    PT Start Time 1032    PT Stop Time 1115    PT Time Calculation (min) 43 min    Activity Tolerance Patient tolerated treatment well    Behavior During Therapy WFL for tasks assessed/performed             Past Medical History:  Diagnosis Date   Arthritis    neck, back, hips, knees, ankles   Asthma    use everyday   Cancer ([place]) 2019   skin   Dyspnea    Dysrhythmia    PVCs , Afib   Heart murmur    History of kidney stones    Hyperlipidemia    Hypertension    Hyperthyroidism 08/08/2017   radio active iodine x2   Hypothyroidism    now take synthroid   Paroxysmal atrial fibrillation ([place]) 08/09/2015   PONV (postoperative nausea and vomiting)     Past Surgical History:  Procedure Laterality Date   CARDIOVERSION N/A 08/05/2016   Procedure: CARDIOVERSION;  Surgeon: Skeet Latch, MD;  Location: [place];  Service: Cardiovascular;  Laterality: N/A;   CATARACT EXTRACTION Left 09/2012   Dr. Bunnie Philips   LITHOTRIPSY     REPLACEMENT TOTAL KNEE BILATERAL  2011   REVERSE SHOULDER ARTHROPLASTY Left 07/19/2020   Procedure: REVERSE SHOULDER ARTHROPLASTY;  Surgeon: Justice Britain, MD;  Location: [facility];  Service: Orthopedics;  Laterality: Left;  164min   REVERSE SHOULDER ARTHROPLASTY Right 12/06/2020   Procedure: REVERSE SHOULDER ARTHROPLASTY;  Surgeon: Justice Britain, MD;  Location: [facility];  Service: Orthopedics;  Laterality:  Right;  161min   THYROIDECTOMY  1971   TUBAL LIGATION  1979    There were no vitals filed for this visit.   Subjective Assessment - 01/21/21 1117     Subjective COVID-19 screen performed prior to patient entering clinic. Asking for home pulley system for home again.    Pertinent History HTN, thyroid problems, bilateral TKA's, left TSA.    Patient Stated Goals Use right UE with minimal pain.    Currently in Pain? Other (Comment)   No pain assessment provided by patient               [facility] PT Assessment - 01/21/21 0001       Assessment   Medical Diagnosis Reverse left total shoulder replacement    Referring Provider (PT) Justice Britain MD    Onset Date/Surgical Date 12/06/20    Hand Dominance Right    Next MD Visit 02/26/2021      Precautions   Precaution Comments No ultrasound.  Per protocol.                           [facility] Adult PT Treatment/Exercise - 01/21/21 0001       Exercises   Exercises Shoulder      Shoulder Exercises: Supine   Protraction  AAROM;Both;15 reps    External Rotation AAROM;Right;15 reps    Flexion AAROM;Both;15 reps      Shoulder Exercises: Pulleys   Flexion 5 minutes      Shoulder Exercises: ROM/Strengthening   Ranger sitting; flex, CW and CCW circles      Modalities   Modalities Vasopneumatic      Vasopneumatic   Number Minutes Vasopneumatic  10 minutes    Vasopnuematic Location  Shoulder    Vasopneumatic Pressure Low    Vasopneumatic Temperature  34/pain      Manual Therapy   Manual Therapy Passive ROM    Passive ROM PROM R shoulder with low load lond duration stretching for flexion/ER                     PT Education - 01/21/21 1135     Education Details Pulley system    Person(s) Educated Patient    Methods Handout;Explanation    Comprehension Verbalized understanding                 PT Long Term Goals - 12/27/20 1728       PT LONG TERM GOAL #1   Title Independent with a HEP.     Time 4    Period Weeks    Status New      PT LONG TERM GOAL #2   Title Active right shoulder flexion to 135 degrees so the patient can easily reach overhead.    Time 4    Period Weeks    Status New      PT LONG TERM GOAL #3   Title Active ER to 60 degrees+ to allow for easily donning/doffing of apparel.    Time 4    Period Weeks    Status New      PT LONG TERM GOAL #4   Title Increase right shoulder strength to a solid 4/5 to increase stability for performance of functional activities.    Time 4    Period Weeks    Status New      PT LONG TERM GOAL #5   Title Perform ADL's with pain not > 3/10.    Time 4    Period Weeks    Status New                   Plan - 01/21/21 1132     Clinical Impression Statement Patient initated with AAROM exercises with mod assist from PTA in regards to set up due to ROM and strength deficits. Patient was provided a pulley system for home to be used for 3-5 minutes approximately three times daily. Patient very weak with RUE and attempted to drive last week but was unable to latch her seat belt. Has ordered an extended to hopefully assist with the seatbelt. Firm end feels and smooth arc of motion noted during PROM of R shoulder. Normal vasopneumatic response noted following removal of the modality.    Personal Factors and Comorbidities Comorbidity 1    Comorbidities HTN, thyroid problems, bilateral TKA's, left TSA.    Examination-Activity Limitations Other;Reach Overhead    Examination-Participation Restrictions Other    Stability/Clinical Decision Making Stable/Uncomplicated    Rehab Potential Good    PT Frequency 2x / week    PT Duration 4 weeks    PT Treatment/Interventions ADLs/Self Care Home Management;Cryotherapy;Electrical Stimulation;Therapeutic activities;Therapeutic exercise;Neuromuscular re-education;Manual techniques;Patient/family education;Passive range of motion;Vasopneumatic Device    PT Next Visit Plan Progress AAROM  exercises.  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   Aortic atherosclerosis ([place]) 12/26/2019   Palpitations 10/21/2019   Morbid obesity ([place]) 10/21/2019   Encounter for chronic pain management 06/30/2019   Hypothyroidism 09/11/2016   History of atrial fibrillation    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/14/2016   Restrictive lung disease 09/28/2015   Lumbar degenerative disc disease 08/15/2015   PAF (paroxysmal atrial fibrillation) ([place]) 08/09/2015   Chronic allergic rhinitis 05/17/2014   Cataract 06/03/2012   Hyperlipidemia LDL goal <100 12/21/2010   Essential hypertension 12/21/2010   GERD (gastroesophageal reflux disease) 07/18/2010   Insulin resistance 07/18/2010   Fatty liver 07/18/2010    Standley Brooking, PTA 01/21/2021, 11:36 AM  [facility] [address] [place], [place], [address] Phone: [phone]   Fax:  [phone]  Name: KEYRA VIRELLA MRN: [id number] Date of Birth: September 03, 1945

## 2021-01-24 ENCOUNTER — Other Ambulatory Visit: Payer: Self-pay

## 2021-01-24 ENCOUNTER — Ambulatory Visit: Payer: Medicare Other | Admitting: Physical Therapy

## 2021-01-24 ENCOUNTER — Encounter: Payer: Self-pay | Admitting: Physical Therapy

## 2021-01-24 DIAGNOSIS — M25611 Stiffness of right shoulder, not elsewhere classified: Secondary | ICD-10-CM

## 2021-01-24 DIAGNOSIS — R600 Localized edema: Secondary | ICD-10-CM

## 2021-01-24 DIAGNOSIS — G8929 Other chronic pain: Secondary | ICD-10-CM

## 2021-01-24 DIAGNOSIS — M25511 Pain in right shoulder: Secondary | ICD-10-CM | POA: Diagnosis not present

## 2021-01-24 NOTE — Therapy (Signed)
[facility] [place], [place], [address] Phone: [phone]   Fax:  [phone]  Physical Therapy Treatment  Patient Details  Name: Martha Perkins MRN: [id number] Date of Birth: December 04, 1945 Referring Provider (PT): Justice Britain MD   Encounter Date: 01/24/2021   PT End of Session - 01/24/21 1005     Visit Number 9    Number of Visits 12    Date for PT Re-Evaluation 02/07/21    Authorization Type FOTO AT LEAST EVERY 5TH VISIT.  PROGRESS NOTE AT 10TH VISIT.  KX MODIFIER AFTER 15 VISITS.    PT Start Time 0950    PT Stop Time 1033    PT Time Calculation (min) 43 min    Activity Tolerance Patient tolerated treatment well    Behavior During Therapy WFL for tasks assessed/performed             Past Medical History:  Diagnosis Date   Arthritis    neck, back, hips, knees, ankles   Asthma    use everyday   Cancer ([place]) 2019   skin   Dyspnea    Dysrhythmia    PVCs , Afib   Heart murmur    History of kidney stones    Hyperlipidemia    Hypertension    Hyperthyroidism 08/08/2017   radio active iodine x2   Hypothyroidism    now take synthroid   Paroxysmal atrial fibrillation ([place]) 08/09/2015   PONV (postoperative nausea and vomiting)     Past Surgical History:  Procedure Laterality Date   CARDIOVERSION N/A 08/05/2016   Procedure: CARDIOVERSION;  Surgeon: Skeet Latch, MD;  Location: [place];  Service: Cardiovascular;  Laterality: N/A;   CATARACT EXTRACTION Left 09/2012   Dr. Bunnie Philips   LITHOTRIPSY     REPLACEMENT TOTAL KNEE BILATERAL  2011   REVERSE SHOULDER ARTHROPLASTY Left 07/19/2020   Procedure: REVERSE SHOULDER ARTHROPLASTY;  Surgeon: Justice Britain, MD;  [facility];  Service: Orthopedics;  Laterality: Left;  175min   REVERSE SHOULDER ARTHROPLASTY Right 12/06/2020   Procedure: REVERSE SHOULDER ARTHROPLASTY;  Surgeon: Justice Britain, MD;  Location[facility];  Service: Orthopedics;  Laterality:  Right;  133min   THYROIDECTOMY  1971   TUBAL LIGATION  1979    There were no vitals filed for this visit.   Subjective Assessment - 01/24/21 0956     Subjective COVID-19 screen performed prior to patient entering clinic. Mistakenly reached for her sun visor this morning.    Pertinent History HTN, thyroid problems, bilateral TKA's, left TSA.    Patient Stated Goals Use right UE with minimal pain.    Currently in Pain? Yes    Pain Score 6     Pain Location Shoulder    Pain Orientation Right    Pain Descriptors / Indicators Discomfort    Pain Type Surgical pain    Pain Onset More than a month ago    Pain Frequency Intermittent                [facility] PT Assessment - 01/24/21 0001       Assessment   Medical Diagnosis Reverse left total shoulder replacement    Referring Provider (PT) Justice Britain MD    Onset Date/Surgical Date 12/06/20    Hand Dominance Right    Next MD Visit 02/26/2021      Precautions   Precaution Comments No ultrasound.  Per protocol.  [facility] Adult PT Treatment/Exercise - 01/24/21 0001       Shoulder Exercises: Supine   Protraction AAROM;Both;20 reps    External Rotation AAROM;Right;20 reps    Flexion AAROM;Both;20 reps      Shoulder Exercises: Pulleys   Flexion 5 minutes      Shoulder Exercises: ROM/Strengthening   Ranger sitting; flex, CW and CCW circles      Modalities   Modalities Vasopneumatic      Vasopneumatic   Number Minutes Vasopneumatic  10 minutes    Vasopnuematic Location  Shoulder    Vasopneumatic Pressure Low    Vasopneumatic Temperature  34/pain      Manual Therapy   Manual Therapy Passive ROM    Passive ROM PROM R shoulder with low load lond duration stretching for flexion/ER                          PT Long Term Goals - 12/27/20 1728       PT LONG TERM GOAL #1   Title Independent with a HEP.    Time 4    Period Weeks    Status New      PT LONG TERM  GOAL #2   Title Active right shoulder flexion to 135 degrees so the patient can easily reach overhead.    Time 4    Period Weeks    Status New      PT LONG TERM GOAL #3   Title Active ER to 60 degrees+ to allow for easily donning/doffing of apparel.    Time 4    Period Weeks    Status New      PT LONG TERM GOAL #4   Title Increase right shoulder strength to a solid 4/5 to increase stability for performance of functional activities.    Time 4    Period Weeks    Status New      PT LONG TERM GOAL #5   Title Perform ADL's with pain not > 3/10.    Time 4    Period Weeks    Status New                   Plan - 01/24/21 1043     Clinical Impression Statement Patient presented in clinic with reports of more RUE discomfort after mistakenly reaching for her sunvisor with RUE. Patient able to tolerate AAROM fairly well other than intermittant discomfort of R shoulder. Intermittant VCs and tactile guidance provided to ensure proper motion. Firm end feels and smooth arc of motion noted during PROM of R shoulder. Normal vasopneumatic response noted following removal of the modality.    Personal Factors and Comorbidities Comorbidity 1    Comorbidities HTN, thyroid problems, bilateral TKA's, left TSA.    Examination-Activity Limitations Other;Reach Overhead    Examination-Participation Restrictions Other    Stability/Clinical Decision Making Stable/Uncomplicated    Rehab Potential Good    PT Frequency 2x / week    PT Duration 4 weeks    PT Treatment/Interventions ADLs/Self Care Home Management;Cryotherapy;Electrical Stimulation;Therapeutic activities;Therapeutic exercise;Neuromuscular re-education;Manual techniques;Patient/family education;Passive range of motion;Vasopneumatic Device    PT Next Visit Plan Progress AAROM exercises.    Consulted and Agree with Plan of Care Patient             Patient will benefit from skilled therapeutic intervention in order to improve the  following deficits and impairments:  Pain, Decreased activity tolerance, Increased edema, Decreased range of motion  Visit Diagnosis: Stiffness of right shoulder, not elsewhere classified  Chronic right shoulder pain  Localized edema     Problem List Patient Active Problem List   Diagnosis Date Noted   Venous insufficiency of both lower extremities 10/03/2020   S/P reverse total shoulder arthroplasty, 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   Aortic atherosclerosis ([place]) 12/26/2019   Palpitations 10/21/2019   Morbid obesity ([place]) 10/21/2019   Encounter for chronic pain management 06/30/2019   Hypothyroidism 09/11/2016   History of atrial fibrillation    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/14/2016   Restrictive lung disease 09/28/2015   Lumbar degenerative disc disease 08/15/2015   PAF (paroxysmal atrial fibrillation) ([place]) 08/09/2015   Chronic allergic rhinitis 05/17/2014   Cataract 06/03/2012   Hyperlipidemia LDL goal <100 12/21/2010   Essential hypertension 12/21/2010   GERD (gastroesophageal reflux disease) 07/18/2010   Insulin resistance 07/18/2010   Fatty liver 07/18/2010    Standley Brooking, PTA 01/24/2021, 10:56 AM  [facility] [address] [place], [place], [address] Phone: [phone]   Fax:  [phone]  Name: COLETTE DICAMILLO MRN: [id number] Date of Birth: May 19, 1945

## 2021-01-29 ENCOUNTER — Other Ambulatory Visit: Payer: Self-pay

## 2021-01-29 ENCOUNTER — Ambulatory Visit: Payer: Medicare Other | Admitting: Physical Therapy

## 2021-01-29 DIAGNOSIS — M25611 Stiffness of right shoulder, not elsewhere classified: Secondary | ICD-10-CM

## 2021-01-29 DIAGNOSIS — R600 Localized edema: Secondary | ICD-10-CM

## 2021-01-29 DIAGNOSIS — G8929 Other chronic pain: Secondary | ICD-10-CM

## 2021-01-29 DIAGNOSIS — M25511 Pain in right shoulder: Secondary | ICD-10-CM

## 2021-01-29 NOTE — Therapy (Signed)
Martha Perkins, [place], [address] Phone: [phone]   Fax:  [phone]  Physical Therapy Treatment  Patient Details  Name: Martha Perkins MRN: [id number] Date of Birth: [birth year] Referring Provider (PT): Martha Britain MD   Encounter Date: 01/29/2021   PT End of Session - 01/29/21 1124     Visit Number 10    Number of Visits 12    Date for PT Re-Evaluation 02/07/21    Authorization Type FOTO AT LEAST EVERY 5TH VISIT.  PROGRESS NOTE AT 10TH VISIT.  KX MODIFIER AFTER 15 VISITS.    PT Start Time 1032             Past Medical History:  Diagnosis Date   Arthritis    neck, back, hips, knees, ankles   Asthma    use everyday   Cancer ([place]) 2019   skin   Dyspnea    Dysrhythmia    PVCs , Afib   Heart murmur    History of kidney stones    Hyperlipidemia    Hypertension    Hyperthyroidism 08/08/2017   radio active iodine x2   Hypothyroidism    now take synthroid   Paroxysmal atrial fibrillation ([place]) 08/09/2015   PONV (postoperative nausea and vomiting)     Past Surgical History:  Procedure Laterality Date   CARDIOVERSION N/A 08/05/2016   Procedure: CARDIOVERSION;  Surgeon: Martha Latch, MD;  Location: [place];  Service: Cardiovascular;  Laterality: N/A;   CATARACT EXTRACTION Left 09/2012   Dr. Bunnie Philips   LITHOTRIPSY     REPLACEMENT TOTAL KNEE BILATERAL  2011   REVERSE SHOULDER ARTHROPLASTY Left 07/19/2020   Procedure: REVERSE SHOULDER ARTHROPLASTY;  Surgeon: Martha Britain, MD;  [facility];  Service: Orthopedics;  Laterality: Left;  132min   REVERSE SHOULDER ARTHROPLASTY Right 12/06/2020   Procedure: REVERSE SHOULDER ARTHROPLASTY;  Surgeon: Martha Britain, MD;  [facility];  Service: Orthopedics;  Laterality: Right;  139min   THYROIDECTOMY  1971   TUBAL LIGATION  1979    There were no vitals filed for this visit.   Subjective Assessment - 01/29/21 1124     Subjective  COVID-19 screen performed prior to patient entering clinic.  Hurting a lot this morning.  Wants to go easy.    Pertinent History HTN, thyroid problems, bilateral TKA's, left TSA.    Patient Stated Goals Use right UE with minimal pain.    Pain Score 9     Pain Location Shoulder    Pain Descriptors / Indicators Aching    Pain Onset More than a month ago                               [facility] Adult PT Treatment/Exercise - 01/29/21 0001       Shoulder Exercises: Seated   Other Seated Exercises Seated UE Ranger x 4 minutes.      Shoulder Exercises: Pulleys   Flexion --   4 minutes.     Modalities   Modalities Electrical Stimulation;Moist Heat      Moist Heat Therapy   Number Minutes Moist Heat 20 Minutes    Moist Heat Location --   Right shoulder.     Acupuncturist Location Right shoulder.    Electrical Stimulation Action IFC at 80-150 Hz x 20 minutes.    Electrical Stimulation Goals Pain;Tone      Manual Therapy  Manual Therapy Soft tissue mobilization    Soft tissue mobilization STW/M x 15 minutes to patient's right shoulder.                          PT Long Term Goals - 12/27/20 1728       PT LONG TERM GOAL #1   Title Independent with a HEP.    Time 4    Period Weeks    Status New      PT LONG TERM GOAL #2   Title Active right shoulder flexion to 135 degrees so the patient can easily reach overhead.    Time 4    Period Weeks    Status New      PT LONG TERM GOAL #3   Title Active ER to 60 degrees+ to allow for easily donning/doffing of apparel.    Time 4    Period Weeks    Status New      PT LONG TERM GOAL #4   Title Increase right shoulder strength to a solid 4/5 to increase stability for performance of functional activities.    Time 4    Period Weeks    Status New      PT LONG TERM GOAL #5   Title Perform ADL's with pain not > 3/10.    Time 4    Period Weeks    Status New                    Plan - 01/29/21 1128     Clinical Impression Statement The patient came into the clinic today in a lot of pain and she feels it could be related to the cold weather.  Performed a more conservative today per her request.  She felt much better after treatment.    Personal Factors and Comorbidities Comorbidity 1    Comorbidities HTN, thyroid problems, bilateral TKA's, left TSA.    Examination-Activity Limitations Other;Reach Overhead    Examination-Participation Restrictions Other    Stability/Clinical Decision Making Stable/Uncomplicated    Rehab Potential Good    PT Frequency 2x / week    PT Duration 4 weeks    PT Treatment/Interventions ADLs/Self Care Home Management;Cryotherapy;Electrical Stimulation;Therapeutic activities;Therapeutic exercise;Neuromuscular re-education;Manual techniques;Patient/family education;Passive range of motion;Vasopneumatic Device    PT Next Visit Plan Progress AAROM exercises.    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   Aortic atherosclerosis ([place]) 12/26/2019   Palpitations 10/21/2019   Morbid obesity ([place]) 10/21/2019   Encounter for chronic pain management 06/30/2019   Hypothyroidism 09/11/2016   History of atrial fibrillation    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/14/2016   Restrictive lung disease 09/28/2015   Lumbar degenerative disc disease 08/15/2015   PAF (paroxysmal atrial fibrillation) ([place])  08/09/2015   Chronic allergic rhinitis 05/17/2014   Cataract 06/03/2012   Hyperlipidemia LDL goal <100 12/21/2010   Essential hypertension 12/21/2010   GERD (gastroesophageal reflux disease) 07/18/2010  Insulin resistance 07/18/2010   Fatty liver 07/18/2010   Progress Note Reporting Period 12/27/20 to 01/29/21  See note below for Objective Data and Assessment of Progress/Goals. Goals ongoing at this time.    Wymon Swaney, [place], PT 01/29/2021, 11:33 AM  [facility] [address] [place], [place], [address] Phone: [phone]   Fax:  [phone]  Name: Martha Perkins MRN: [id number] Date of Birth: [birth year]

## 2021-02-04 ENCOUNTER — Ambulatory Visit: Payer: Medicare Other | Admitting: Physical Therapy

## 2021-02-04 ENCOUNTER — Other Ambulatory Visit: Payer: Self-pay

## 2021-02-04 DIAGNOSIS — G8929 Other chronic pain: Secondary | ICD-10-CM

## 2021-02-04 DIAGNOSIS — M25611 Stiffness of right shoulder, not elsewhere classified: Secondary | ICD-10-CM | POA: Diagnosis not present

## 2021-02-04 DIAGNOSIS — M25511 Pain in right shoulder: Secondary | ICD-10-CM | POA: Diagnosis not present

## 2021-02-04 DIAGNOSIS — R600 Localized edema: Secondary | ICD-10-CM | POA: Diagnosis not present

## 2021-02-04 NOTE — Therapy (Signed)
[facility] [place], [place], [address] Phone: [phone]   Fax:  [phone]  Physical Therapy Treatment  Patient Details  Name: Martha Perkins MRN: [id number] Date of Birth: September 13, 1945 Referring Provider (PT): Justice Britain MD   Encounter Date: 02/04/2021   PT End of Session - 02/04/21 1152     Visit Number 11    Number of Visits 12    Date for PT Re-Evaluation 02/07/21    Authorization Type FOTO AT LEAST EVERY 5TH VISIT.  PROGRESS NOTE AT 10TH VISIT.  KX MODIFIER AFTER 15 VISITS.    PT Start Time 1030    PT Stop Time 1121    PT Time Calculation (min) 51 min    Activity Tolerance Patient tolerated treatment well    Behavior During Therapy WFL for tasks assessed/performed             Past Medical History:  Diagnosis Date   Arthritis    neck, back, hips, knees, ankles   Asthma    use everyday   Cancer ([place]) 2019   skin   Dyspnea    Dysrhythmia    PVCs , Afib   Heart murmur    History of kidney stones    Hyperlipidemia    Hypertension    Hyperthyroidism 08/08/2017   radio active iodine x2   Hypothyroidism    now take synthroid   Paroxysmal atrial fibrillation ([place]) 08/09/2015   PONV (postoperative nausea and vomiting)     Past Surgical History:  Procedure Laterality Date   CARDIOVERSION N/A 08/05/2016   Procedure: CARDIOVERSION;  Surgeon: Skeet Latch, MD;  Location: [facility];  Service: Cardiovascular;  Laterality: N/A;   CATARACT EXTRACTION Left 09/2012   Dr. Bunnie Philips   LITHOTRIPSY     REPLACEMENT TOTAL KNEE BILATERAL  2011   REVERSE SHOULDER ARTHROPLASTY Left 07/19/2020   Procedure: REVERSE SHOULDER ARTHROPLASTY;  Surgeon: Justice Britain, MD;  Location: [facility];  Service: Orthopedics;  Laterality: Left;  155min   REVERSE SHOULDER ARTHROPLASTY Right 12/06/2020   Procedure: REVERSE SHOULDER ARTHROPLASTY;  Surgeon: Justice Britain, MD;  Location: [facility];  Service: Orthopedics;  Laterality:  Right;  117min   THYROIDECTOMY  1971   TUBAL LIGATION  1979    There were no vitals filed for this visit.   Subjective Assessment - 02/04/21 1153     Subjective COVID-19 screen performed prior to patient entering clinic.  Patient reports last treatment was very helpful.  She strained her shoulder this morning and her pain is at a 5.    Pertinent History HTN, thyroid problems, bilateral TKA's, left TSA.    Patient Stated Goals Use right UE with minimal pain.    Currently in Pain? Yes    Pain Score 5     Pain Location Shoulder    Pain Orientation Right    Pain Descriptors / Indicators Aching    Pain Onset More than a month ago                               [facility] Adult PT Treatment/Exercise - 02/04/21 0001       Exercises   Exercises Shoulder      Shoulder Exercises: Pulleys   Flexion 5 minutes    Other Pulley Exercises Seated UE Ranger x 5 minutes.      Modalities   Modalities Electrical Stimulation;Moist Heat      Moist Heat Therapy  Number Minutes Moist Heat 20 Minutes    Moist Heat Location --   Right shoulder.     Acupuncturist Location Right shoulder.    Electrical Stimulation Action IFc at 80-150 Hz.    Electrical Stimulation Parameters 40% scan x 20 minutes.    Electrical Stimulation Goals Pain;Tone      Manual Therapy   Manual Therapy Soft tissue mobilization    Soft tissue mobilization STW/M x 13 minutesto patient right shoulder to reduce tone.                          PT Long Term Goals - 01/29/21 1136       PT LONG TERM GOAL #1   Title Independent with a HEP.    Time 4    Period Weeks    Status On-going      PT LONG TERM GOAL #2   Title Active right shoulder flexion to 135 degrees so the patient can easily reach overhead.    Time 4    Period Weeks    Status On-going      PT LONG TERM GOAL #3   Title Active ER to 60 degrees+ to allow for easily donning/doffing of apparel.     Time 4    Period Weeks    Status On-going      PT LONG TERM GOAL #4   Title Increase right shoulder strength to a solid 4/5 to increase stability for performance of functional activities.    Time 4    Period Weeks    Status On-going      PT LONG TERM GOAL #5   Title Perform ADL's with pain not > 3/10.    Time 4    Period Weeks    Status On-going                   Plan - 02/04/21 1157     Clinical Impression Statement The patient is doing better since receiving soft tissue work which as brought her pain done so her shoulder will move better.  Continue per TSA protocol.    Personal Factors and Comorbidities Comorbidity 1    Comorbidities HTN, thyroid problems, bilateral TKA's, left TSA.    Examination-Activity Limitations Other;Reach Overhead    Examination-Participation Restrictions Other    Stability/Clinical Decision Making Stable/Uncomplicated    Rehab Potential Good    PT Frequency 2x / week    PT Duration 4 weeks    PT Treatment/Interventions ADLs/Self Care Home Management;Cryotherapy;Electrical Stimulation;Therapeutic activities;Therapeutic exercise;Neuromuscular re-education;Manual techniques;Patient/family education;Passive range of motion;Vasopneumatic Device    PT Next Visit Plan Progress AAROM exercises.    Consulted and Agree with Plan of Care Patient             Patient will benefit from skilled therapeutic intervention in order to improve the following deficits and impairments:  Pain, Decreased activity tolerance, Increased edema, Decreased range of motion  Visit Diagnosis: Stiffness of right shoulder, not elsewhere classified  Chronic right shoulder pain     Problem List Patient Active Problem List   Diagnosis Date Noted   Venous insufficiency of both lower extremities 10/03/2020   S/P reverse total shoulder arthroplasty, 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   Aortic atherosclerosis ([place]) 12/26/2019   Palpitations 10/21/2019   Morbid obesity ([place])  10/21/2019   Encounter for chronic pain management 06/30/2019   Hypothyroidism 09/11/2016   History of atrial fibrillation    Primary osteoarthritis of  both shoulders 02/14/2016   Restrictive lung disease 09/28/2015   Lumbar degenerative disc disease 08/15/2015   PAF (paroxysmal atrial fibrillation) ([place]) 08/09/2015   Chronic allergic rhinitis 05/17/2014   Cataract 06/03/2012   Hyperlipidemia LDL goal <100 12/21/2010   Essential hypertension 12/21/2010   GERD (gastroesophageal reflux disease) 07/18/2010   Insulin resistance 07/18/2010   Fatty liver 07/18/2010    Flavia Bruss, [place], PT 02/04/2021, 12:03 PM  [facility] [address] [place], [place], [address] Phone: [phone]   Fax:  [phone]  Name: TAHLIYAH ANAGNOS MRN: [id number] Date of Birth: Oct 19, 1945

## 2021-02-07 ENCOUNTER — Ambulatory Visit: Payer: Medicare Other | Attending: Orthopedic Surgery | Admitting: Physical Therapy

## 2021-02-07 ENCOUNTER — Other Ambulatory Visit: Payer: Self-pay

## 2021-02-07 ENCOUNTER — Encounter: Payer: Self-pay | Admitting: Physical Therapy

## 2021-02-07 DIAGNOSIS — G8929 Other chronic pain: Secondary | ICD-10-CM | POA: Diagnosis not present

## 2021-02-07 DIAGNOSIS — M25511 Pain in right shoulder: Secondary | ICD-10-CM | POA: Insufficient documentation

## 2021-02-07 DIAGNOSIS — M25611 Stiffness of right shoulder, not elsewhere classified: Secondary | ICD-10-CM | POA: Diagnosis not present

## 2021-02-07 DIAGNOSIS — R600 Localized edema: Secondary | ICD-10-CM

## 2021-02-07 NOTE — Addendum Note (Signed)
Addended by: Alyss Granato, [place] W on: 02/07/2021 12:37 PM   Modules accepted: Orders

## 2021-02-07 NOTE — Therapy (Signed)
[facility] [place], [place], [address] Phone: [phone]   Fax:  [phone]  Physical Therapy Treatment  Patient Details  Name: Martha Perkins MRN: [id number] Date of Birth: [birth year] Referring Provider (PT): Justice Britain MD   Encounter Date: 02/07/2021   PT End of Session - 02/07/21 1037     Visit Number 12    Number of Visits 12    Date for PT Re-Evaluation 02/07/21    Authorization Type FOTO AT LEAST EVERY 5TH VISIT.  PROGRESS NOTE AT 10TH VISIT.  KX MODIFIER AFTER 15 VISITS.    PT Start Time 1032    PT Stop Time 1117    PT Time Calculation (min) 45 min    Activity Tolerance Patient tolerated treatment well    Behavior During Therapy WFL for tasks assessed/performed             Past Medical History:  Diagnosis Date   Arthritis    neck, back, hips, knees, ankles   Asthma    use everyday   Cancer ([place]) 2019   skin   Dyspnea    Dysrhythmia    PVCs , Afib   Heart murmur    History of kidney stones    Hyperlipidemia    Hypertension    Hyperthyroidism 08/08/2017   radio active iodine x2   Hypothyroidism    now take synthroid   Paroxysmal atrial fibrillation ([place]) 08/09/2015   PONV (postoperative nausea and vomiting)     Past Surgical History:  Procedure Laterality Date   CARDIOVERSION N/A 08/05/2016   Procedure: CARDIOVERSION;  Surgeon: Skeet Latch, MD;  Location: [facility];  Service: Cardiovascular;  Laterality: N/A;   CATARACT EXTRACTION Left 09/2012   Dr. Bunnie Philips   LITHOTRIPSY     REPLACEMENT TOTAL KNEE BILATERAL  2011   REVERSE SHOULDER ARTHROPLASTY Left 07/19/2020   Procedure: REVERSE SHOULDER ARTHROPLASTY;  Surgeon: Justice Britain, MD;  [facility];  Service: Orthopedics;  Laterality: Left;  149min   REVERSE SHOULDER ARTHROPLASTY Right 12/06/2020   Procedure: REVERSE SHOULDER ARTHROPLASTY;  Surgeon: Justice Britain, MD;  [facility];  Service: Orthopedics;  Laterality:  Right;  1104min   THYROIDECTOMY  1971   TUBAL LIGATION  1979    There were no vitals filed for this visit.   Subjective Assessment - 02/07/21 1035     Subjective COVID-19 screen performed prior to patient entering clinic.  Reports that she has had some jabbing like pain in posterior R shoulder. Has had more discomfort overall still.    Pertinent History HTN, thyroid problems, bilateral TKA's, left TSA.    Patient Stated Goals Use right UE with minimal pain.    Currently in Pain? Yes    Pain Score 5     Pain Location Shoulder    Pain Orientation Right    Pain Descriptors / Indicators Discomfort    Pain Type Surgical pain    Pain Onset More than a month ago    Pain Frequency Constant                [facility] PT Assessment - 02/07/21 0001       Assessment   Medical Diagnosis Reverse left total shoulder replacement    Referring Provider (PT) Justice Britain MD    Onset Date/Surgical Date 12/06/20    Hand Dominance Right    Next MD Visit 02/26/2021      Precautions   Precaution Comments No ultrasound.  Per protocol.  [facility] Adult PT Treatment/Exercise - 02/07/21 0001       Elbow Exercises   Elbow Flexion Strengthening;Right;20 reps;Seated;Bar weights/barbell   2#     Shoulder Exercises: Seated   Extension Strengthening;Right;20 reps;Theraband    Theraband Level (Shoulder Extension) Level 1 (Yellow)    Retraction Strengthening;Right;20 reps;Theraband    Theraband Level (Shoulder Retraction) Level 1 (Yellow)    External Rotation AROM;Right;20 reps    External Rotation Limitations Resisted R ER yellow theraband x20 reps    Internal Rotation Strengthening;Right;20 reps;Theraband    Theraband Level (Shoulder Internal Rotation) Level 1 (Yellow)    Flexion AROM;Right;15 reps    Flexion Limitations upper cut    Abduction AROM;Right      Shoulder Exercises: Standing   Other Standing Exercises Cone to low cabinet x15 reps      Shoulder  Exercises: Pulleys   Flexion 5 minutes      Shoulder Exercises: ROM/Strengthening   Wall Wash CW/CCW x15 rpes      Modalities   Modalities Vasopneumatic      Vasopneumatic   Number Minutes Vasopneumatic  10 minutes    Vasopnuematic Location  Shoulder    Vasopneumatic Pressure Low    Vasopneumatic Temperature  34/pain                          PT Long Term Goals - 01/29/21 1136       PT LONG TERM GOAL #1   Title Independent with a HEP.    Time 4    Period Weeks    Status On-going      PT LONG TERM GOAL #2   Title Active right shoulder flexion to 135 degrees so the patient can easily reach overhead.    Time 4    Period Weeks    Status On-going      PT LONG TERM GOAL #3   Title Active ER to 60 degrees+ to allow for easily donning/doffing of apparel.    Time 4    Period Weeks    Status On-going      PT LONG TERM GOAL #4   Title Increase right shoulder strength to a solid 4/5 to increase stability for performance of functional activities.    Time 4    Period Weeks    Status On-going      PT LONG TERM GOAL #5   Title Perform ADL's with pain not > 3/10.    Time 4    Period Weeks    Status On-going                   Plan - 02/07/21 1155     Clinical Impression Statement Patient reports more posterior shoulder jabbing pain as well as continued pain that is worrisome to her. Patient states that she uses her RUE for ADLs such as reaching but limited with lifting certain plates due to weight. Patient progressed to more strengthening and functional training with intermittant multimodal cueing to correct technique. Patient fatigued with therex. Normal vasopneumatic response noted following removal of the modality.    Personal Factors and Comorbidities Comorbidity 1    Comorbidities HTN, thyroid problems, bilateral TKA's, left TSA.    Examination-Activity Limitations Other;Reach Overhead    Examination-Participation Restrictions Other     Stability/Clinical Decision Making Stable/Uncomplicated    Rehab Potential Good    PT Frequency 2x / week    PT Duration 4 weeks    PT Treatment/Interventions ADLs/Self  Care Home Management;Cryotherapy;Electrical Stimulation;Therapeutic activities;Therapeutic exercise;Neuromuscular re-education;Manual techniques;Patient/family education;Passive range of motion;Vasopneumatic Device    PT Next Visit Plan Progress light strengthening and functional ROM.    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   Aortic atherosclerosis ([place]) 12/26/2019   Palpitations 10/21/2019   Morbid obesity ([place]) 10/21/2019   Encounter for chronic pain management 06/30/2019   Hypothyroidism 09/11/2016   History of atrial fibrillation    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/14/2016   Restrictive lung disease 09/28/2015   Lumbar degenerative disc disease 08/15/2015   PAF (paroxysmal atrial fibrillation) ([place]) 08/09/2015   Chronic allergic rhinitis 05/17/2014   Cataract 06/03/2012   Hyperlipidemia LDL goal <100 12/21/2010   Essential hypertension 12/21/2010   GERD (gastroesophageal reflux disease) 07/18/2010   Insulin resistance 07/18/2010   Fatty liver 07/18/2010    Standley Brooking, PTA 02/07/2021, 12:16 PM  [facility] [address] [place], [place], [address] Phone: [phone]   Fax:  [phone]  Name: Martha Perkins MRN:  [id number] Date of Birth: Dec 18, 1945

## 2021-02-11 ENCOUNTER — Other Ambulatory Visit: Payer: Self-pay

## 2021-02-11 ENCOUNTER — Ambulatory Visit: Payer: Medicare Other | Admitting: Physical Therapy

## 2021-02-11 DIAGNOSIS — M25511 Pain in right shoulder: Secondary | ICD-10-CM

## 2021-02-11 DIAGNOSIS — R600 Localized edema: Secondary | ICD-10-CM | POA: Diagnosis not present

## 2021-02-11 DIAGNOSIS — M25611 Stiffness of right shoulder, not elsewhere classified: Secondary | ICD-10-CM

## 2021-02-11 DIAGNOSIS — G8929 Other chronic pain: Secondary | ICD-10-CM

## 2021-02-11 NOTE — Therapy (Signed)
[facility] [place], [place], [address] Phone: [phone]   Fax:  [phone]  Physical Therapy Treatment  Patient Details  Name: Martha Perkins MRN: [id number] Date of Birth: 10/07/1945 Referring Provider (PT): Justice Britain MD   Encounter Date: 02/11/2021   PT End of Session - 02/11/21 1511     Visit Number 13    Number of Visits 16    Date for PT Re-Evaluation 02/21/21    Authorization Type FOTO AT LEAST EVERY 5TH VISIT.  PROGRESS NOTE AT 10TH VISIT.  KX MODIFIER AFTER 15 VISITS.    PT Start Time 1030    PT Stop Time 1114    PT Time Calculation (min) 44 min    Activity Tolerance Patient tolerated treatment well    Behavior During Therapy WFL for tasks assessed/performed             Past Medical History:  Diagnosis Date   Arthritis    neck, back, hips, knees, ankles   Asthma    use everyday   Cancer ([place]) 2019   skin   Dyspnea    Dysrhythmia    PVCs , Afib   Heart murmur    History of kidney stones    Hyperlipidemia    Hypertension    Hyperthyroidism 08/08/2017   radio active iodine x2   Hypothyroidism    now take synthroid   Paroxysmal atrial fibrillation ([place]) 08/09/2015   PONV (postoperative nausea and vomiting)     Past Surgical History:  Procedure Laterality Date   CARDIOVERSION N/A 08/05/2016   Procedure: CARDIOVERSION;  Surgeon: Skeet Latch, MD;  Location: [facility];  Service: Cardiovascular;  Laterality: N/A;   CATARACT EXTRACTION Left 09/2012   Dr. Bunnie Philips   LITHOTRIPSY     REPLACEMENT TOTAL KNEE BILATERAL  2011   REVERSE SHOULDER ARTHROPLASTY Left 07/19/2020   Procedure: REVERSE SHOULDER ARTHROPLASTY;  Surgeon: Justice Britain, MD;  [facility];  Service: Orthopedics;  Laterality: Left;  176min   REVERSE SHOULDER ARTHROPLASTY Right 12/06/2020   Procedure: REVERSE SHOULDER ARTHROPLASTY;  Surgeon: Justice Britain, MD;  [facility];  Service: Orthopedics;  Laterality:  Right;  173min   THYROIDECTOMY  1971   TUBAL LIGATION  1979    There were no vitals filed for this visit.   Subjective Assessment - 02/11/21 1514     Subjective COVID-19 screen performed prior to patient entering clinic.  Pain at a 5.    Pertinent History HTN, thyroid problems, bilateral TKA's, left TSA.    Patient Stated Goals Use right UE with minimal pain.    Currently in Pain? Yes    Pain Score 5     Pain Location Shoulder    Pain Orientation Right    Pain Descriptors / Indicators Discomfort    Pain Onset More than a month ago                               [facility] Adult PT Treatment/Exercise - 02/11/21 0001       Exercises   Exercises Shoulder      Shoulder Exercises: Supine   Other Supine Exercises In supine:  Right shoulder punches, rhy stabs, flexion and ER with hand hand starting from abdominal to fatigue x 2.      Shoulder Exercises: Pulleys   Flexion 5 minutes    Other Pulley Exercises Seated Ranger x 5 minutes.  Shoulder Exercises: ROM/Strengthening   UBE (Upper Arm Bike) 120 RPM's slow pace 4 minutes forward and 4 minutes backward (8 minutes total).      Modalities   Modalities Teacher, English as a foreign language Location RT shoulder.    Electrical Stimulation Action IFC at 80-150 Hz.    Electrical Stimulation Parameters 40% scan x 15 minutes.    Electrical Stimulation Goals Pain;Tone                          PT Long Term Goals - 01/29/21 1136       PT LONG TERM GOAL #1   Title Independent with a HEP.    Time 4    Period Weeks    Status On-going      PT LONG TERM GOAL #2   Title Active right shoulder flexion to 135 degrees so the patient can easily reach overhead.    Time 4    Period Weeks    Status On-going      PT LONG TERM GOAL #3   Title Active ER to 60 degrees+ to allow for easily donning/doffing of apparel.    Time 4    Period Weeks    Status On-going       PT LONG TERM GOAL #4   Title Increase right shoulder strength to a solid 4/5 to increase stability for performance of functional activities.    Time 4    Period Weeks    Status On-going      PT LONG TERM GOAL #5   Title Perform ADL's with pain not > 3/10.    Time 4    Period Weeks    Status On-going                   Plan - 02/11/21 1520     Clinical Impression Statement Patient did great today with the addition of slow pace UBE.  Her active right shoulder motion from the supine position is improving nicely.    Personal Factors and Comorbidities Comorbidity 1    Comorbidities HTN, thyroid problems, bilateral TKA's, left TSA.    Examination-Activity Limitations Other;Reach Overhead    Examination-Participation Restrictions Other    Stability/Clinical Decision Making Stable/Uncomplicated    Rehab Potential Good    PT Frequency 2x / week    PT Duration 4 weeks    PT Treatment/Interventions ADLs/Self Care Home Management;Cryotherapy;Electrical Stimulation;Therapeutic activities;Therapeutic exercise;Neuromuscular re-education;Manual techniques;Patient/family education;Passive range of motion;Vasopneumatic Device    PT Next Visit Plan Progress light strengthening and functional ROM.    Consulted and Agree with Plan of Care Patient             Patient will benefit from skilled therapeutic intervention in order to improve the following deficits and impairments:  Pain, Decreased activity tolerance, Increased edema, Decreased range of motion  Visit Diagnosis: Stiffness of right shoulder, not elsewhere classified  Chronic right shoulder pain     Problem List Patient Active Problem List   Diagnosis Date Noted   Venous insufficiency of both lower extremities 10/03/2020   S/P reverse total shoulder arthroplasty, 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   Aortic atherosclerosis ([place]) 12/26/2019   Palpitations 10/21/2019   Morbid obesity ([place]) 10/21/2019   Encounter for chronic pain  management 06/30/2019   Hypothyroidism 09/11/2016   History of atrial fibrillation    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/14/2016   Restrictive lung disease 09/28/2015   Lumbar  degenerative disc disease 08/15/2015   PAF (paroxysmal atrial fibrillation) ([place]) 08/09/2015   Chronic allergic rhinitis 05/17/2014   Cataract 06/03/2012   Hyperlipidemia LDL goal <100 12/21/2010   Essential hypertension 12/21/2010   GERD (gastroesophageal reflux disease) 07/18/2010   Insulin resistance 07/18/2010   Fatty liver 07/18/2010    Mariana Wiederholt, [place], PT 02/11/2021, 3:23 PM  [facility] [address] [place], [place], [address] Phone: [phone]   Fax:  [phone]  Name: Martha Perkins MRN: [id number] Date of Birth: 28-Jul-1945

## 2021-02-12 ENCOUNTER — Telehealth: Payer: Medicare Other

## 2021-02-12 NOTE — Progress Notes (Incomplete)
Chronic Care Management Pharmacy Note  02/12/2021 Name:  Martha Perkins MRN:  [id number] DOB:  06/02/1945  Subjective: Martha Perkins is an 75 y.o. year old female who is a primary patient of Martha Perkins, Martha Kocher, MD.  The CCM team was consulted for assistance with disease management and care coordination needs.    Engaged with patient by telephone for follow up visit in response to provider referral for pharmacy case management and/or care coordination services.   Consent to Services:  The patient was given information about Chronic Care Management services, agreed to services, and gave verbal consent prior to initiation of services.  Please see initial visit note for detailed documentation.   Patient Care Team: Hali Marry, MD as PCP - General (Family Medicine) Renato Shin, MD as Consulting Physician (Endocrinology) Darius Bump, [facility] (Pharmacist) Darius Bump, [facility] as Pharmacist (Pharmacist)  Recent office visits: 05/28/20 - Dr. Madilyn Fireman (PCP): BLE swelling, hypothyroidism, HTN f/u  Recent consult visits: 01/30/20 - Skeet Latch (cardiology): Afib, HTN, [place] [facility] visits: 07/19/20 - Reverse Shoulder Arthroplasty SDS  Objective:  Lab Results  Component Value Date   CREATININE 0.67 12/06/2020   CREATININE 0.69 11/21/2020   CREATININE 0.71 10/17/2020    Lab Results  Component Value Date   HGBA1C 5.5 10/03/2020      Component Value Date/Time   CHOL 192 07/10/2020 0000   TRIG 171 (H) 07/10/2020 0000   HDL 46 (L) 07/10/2020 0000   CHOLHDL 4.2 07/10/2020 0000   VLDL 34 (H) 07/02/2015 0959   LDLCALC 117 (H) 07/10/2020 0000    Hepatic Function Latest Ref Rng & Units 07/10/2020 06/30/2019 09/20/2018  Total Protein 6.1 - 8.1 g/dL 6.8 7.1 6.9  Albumin 3.6 - 5.1 g/dL - - -  AST 10 - 35 U/L 26 24 35  ALT 6 - 29 U/L 38(H) 35(H) 53(H)  Alk Phosphatase 33 - 130 U/L - - -  Total Bilirubin 0.2 - 1.2 mg/dL 0.9 0.9 1.0  Bilirubin, Direct 0.0 - 0.2  mg/dL - - 0.1    Lab Results  Component Value Date/Time   TSH 0.46 07/10/2020 12:00 AM   TSH 4.36 04/12/2020 11:10 AM   FREET4 1.27 10/21/2019 09:47 AM   FREET4 0.96 09/11/2016 11:29 AM    CBC Latest Ref Rng & Units 12/06/2020 11/21/2020 07/11/2020  WBC 4.0 - 10.5 K/uL 12.3(H) 6.4 5.5  Hemoglobin 12.0 - 15.0 g/dL 13.9 13.7 13.7  Hematocrit 36.0 - 46.0 % 42.4 41.2 41.2  Platelets 150 - 400 K/uL 217 191 206    No results found for: VD25OH  Clinical ASCVD: Yes  The 10-year ASCVD risk score (Arnett DK, et al., 2019) is: 23.8%   Values used to calculate the score:     Age: 75 years     Sex: Female     Is Non-Hispanic African American: No     Diabetic: No     Tobacco smoker: No     Systolic Blood Pressure: 802 mmHg     Is BP treated: Yes     HDL Cholesterol: 46 mg/dL     Total Cholesterol: 192 mg/dL    Social History   Tobacco Use  Smoking Status Never  Smokeless Tobacco Never   BP Readings from Last 3 Encounters:  12/06/20 (!) 153/74  12/06/20 (!) 145/64  11/27/20 126/70   Pulse Readings from Last 3 Encounters:  12/06/20 70  12/06/20 (!) 55  11/27/20 74   Wt Readings from  Last 3 Encounters:  12/06/20 208 lb 1.1 oz (94.4 kg)  12/06/20 208 lb 1.1 oz (94.4 kg)  11/21/20 208 lb (94.3 kg)    Assessment: Review of patient past medical history, allergies, medications, health status, including review of consultants reports, laboratory and other test data, was performed as part of comprehensive evaluation and provision of chronic care management services.   SDOH:  (Social Determinants of Health) assessments and interventions performed:    CCM Care Plan  Allergies  Allergen Reactions   Meperidine And Related Nausea And Vomiting   Oxycodone     loopy   Percocet [Oxycodone-Acetaminophen]     Feels loopy   Demerol Nausea And Vomiting    Medications Reviewed Today     Reviewed by App[place], Mali W, PT (Physical Therapist) on 02/11/21 at 1510  Med List Status: <None>    Medication Order Taking? Sig Documenting Provider Last Dose Status Informant  acetaminophen (TYLENOL) 500 MG tablet [id number] No Take 500 mg by mouth every 6 (six) hours as needed for moderate pain or headache. [provider] Taking Active Self  albuterol (VENTOLIN HFA) 108 (90 Base) MCG/ACT inhaler [id number] No INHALE TWO PUFFS INTO THE LUNGS EVERY 6 HOURS AS NEEDED FOR WHEEZING OR SHORTNESS OF BREATH Terrilyn Saver, NP Taking Active   atorvastatin (LIPITOR) 40 MG tablet [id number] No TAKE 1 TABLET BY MOUTH  DAILY  Patient taking differently: Take 40 mg by mouth at bedtime.   Hali Marry, MD Taking Active   benzonatate (TESSALON) 100 MG capsule [id number] No Take 1 capsule (100 mg total) by mouth 2 (two) times daily as needed for cough. Terrilyn Saver, NP Taking Active   Biotin 1000 MCG tablet [id number] No Take 1,000 mcg by mouth daily. [provider] Taking Active Self  Cholecalciferol (VITAMIN D) 125 MCG (5000 UT) CAPS [id number] No Take 5,000 Units by mouth daily. With K2 [provider] Taking Active Self  clotrimazole-betamethasone (LOTRISONE) cream [id number] No Apply 1 application topically 2 (two) times daily.  Patient taking differently: Apply 1 application topically 2 (two) times daily as needed (fungus).   Hali Marry, MD Taking Active   Coenzyme Q10 (COQ-10) 100 MG CAPS [id number] No Take 100 mg by mouth daily. [provider] Taking Active Self           Med Note Wilmon Pali, MELISSA R   Mon Nov 19, 2020  5:25 PM) ON HOLD   cyclobenzaprine (FLEXERIL) 10 MG tablet [id number] No Take 1 tablet (10 mg total) by mouth 3 (three) times daily as needed for muscle spasms. Shuford, Olivia Mackie, PA-C Taking Active   cyclobenzaprine (FLEXERIL) 5 MG tablet [id number] No Take 1 tablet (5 mg total) by mouth at bedtime as needed for muscle spasms. Hali Marry, MD Taking Active Self  fluticasone [facility]) 50 MCG/ACT nasal spray [id number]  USE 1  SPRAY IN BOTH  NOSTRILS TWICE DAILY Hali Marry, MD  Active   Fluticasone Furoate (ARNUITY ELLIPTA) 100 MCG/ACT AEPB [id number] No Inhale 1 Inhaler into the lungs daily. Hali Marry, MD Taking Active Self           Med Note [place] Nov 19, 2020  5:25 PM)    furosemide (LASIX) 20 MG tablet [id number]  TAKE 3 TABLETS BY MOUTH  DAILY Terrilyn Saver, NP  Active   ketoconazole (NIZORAL) 2 % cream [id number] No Apply topically daily as needed for irritation.  Patient taking differently: Apply 1 application  topically daily as needed for irritation.   Hali Marry, MD Taking Active   levothyroxine (SYNTHROID) 150 MCG tablet [id number] No TAKE 1 TABLET BY MOUTH  DAILY BEFORE BREAKFAST TAKE EXTRA ONE-HALF TABLET BY  MOUTH 1 DAY WEEK  Patient taking differently: Take 150-225 mcg by mouth daily before breakfast. TAKE 1 TABLET BY MOUTH  DAILY BEFORE BREAKFAST TAKE EXTRA ONE-HALF TABLET BY  MOUTH 1 DAY WEEK 225 mcg on Saturday and 150 mcg all other days   Hali Marry, MD Taking Active   loratadine (CLARITIN) 10 MG tablet [id number] No Take 10 mg by mouth at bedtime. [provider] Taking Active Self  Magnesium Citrate 200 MG TABS [id number] No Take 200 mg by mouth daily. [provider] Taking Active Self  meloxicam (MOBIC) 15 MG tablet [id number] No TAKE 1 TABLET BY MOUTH  DAILY Hali Marry, MD Taking Active Self  metoprolol succinate (TOPROL-XL) 50 MG 24 hr tablet [id number] No Take 2 tablets (100 mg total) by mouth daily. TAKE 2 TABLETS BY MOUTH  DAILY WITH OR IMMEDIATELY  FOLLOWING A MEAL Terrilyn Saver, NP Taking Active   nirmatrelvir/ritonavir EUA (PAXLOVID) 20 x 150 MG & 10 x 100MG TABS [id number] No Take 3 tablets by mouth 2 (two) times daily. Use as directed per package insert.  May use any available formulation at pharmacy with the same total dosage. Terrilyn Saver, NP Taking Active   ondansetron (ZOFRAN) 4 MG tablet [id number]  No Take 1 tablet (4 mg total) by mouth every 8 (eight) hours as needed for nausea or vomiting. Shuford, Olivia Mackie, PA-C Taking Active   oxyCODONE-acetaminophen (PERCOCET) 5-325 MG tablet [id number] No Take 0.5-1 tablets by mouth every 6 (six) hours as needed for severe pain. Terrilyn Saver, NP Taking Active   potassium chloride (KLOR-CON) 10 MEQ tablet [id number] No TAKE 1 TABLET BY MOUTH  DAILY AS NEEDED  Patient taking differently: Take 10 mEq by mouth daily as needed (Takes with Lasix).   Hali Marry, MD Taking Active   traMADol Veatrice Bourbon) 50 MG tablet [id number]  Take 1 tablet (50 mg total) by mouth 3 (three) times daily as needed. Hali Marry, MD  Active   TURMERIC PO [id number] No Take 1,500 mg by mouth daily. [provider] Taking Active Self  valsartan (DIOVAN) 160 MG tablet [id number] No Take 160 mg by mouth every evening. [provider] Taking Active Self  vitamin B-12 (CYANOCOBALAMIN) 1000 MCG tablet [id number] No Take 1,000 mcg by mouth daily. [provider] Taking Active Self  vitamin C (ASCORBIC ACID) 500 MG tablet [id number] No Take 500 mg by mouth 2 (two) times daily. [provider] Taking Active Self  Zinc 50 MG CAPS [id number] No Take 50 mg by mouth daily. [provider] Taking Active Self            Patient Active Problem List   Diagnosis Date Noted   Venous insufficiency of both lower extremities 10/03/2020   S/P reverse total shoulder arthroplasty, 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   Aortic atherosclerosis ([place]) 12/26/2019   Palpitations 10/21/2019   Morbid obesity ([place]) 10/21/2019   Encounter for chronic pain management 06/30/2019   Hypothyroidism 09/11/2016   History of atrial fibrillation    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/14/2016   Restrictive lung disease 09/28/2015   Lumbar degenerative disc disease 08/15/2015   PAF (paroxysmal atrial fibrillation) ([place]) 08/09/2015   Chronic allergic rhinitis 05/17/2014    Cataract 06/03/2012   Hyperlipidemia LDL goal <  100 12/21/2010   Essential hypertension 12/21/2010   GERD (gastroesophageal reflux disease) 07/18/2010   Insulin resistance 07/18/2010   Fatty liver 07/18/2010    Immunization History  Administered Date(s) Administered   Fluad Quad(high Dose 65+) 12/29/2018, 12/26/2019   Influenza Split 01/27/2012, 01/01/2017   Influenza, High Dose Seasonal PF 01/29/2018   Influenza,inj,Quad PF,6+ Mos 11/26/2012, 12/29/2013, 12/14/2014, 11/13/2015   Moderna Sars-Covid-2 Vaccination 06/14/2019, 07/12/2019, 01/24/2020   PPD Test 05/18/2018   Pneumococcal Conjugate-13 12/29/2013   Pneumococcal Polysaccharide-23 11/26/2012   Tdap 07/02/2015   Zoster Recombinat (Shingrix) 06/11/2020, 09/19/2020    Conditions to be addressed/monitored: Atrial Fibrillation, HTN and HLD  There are no care plans that you recently modified to display for this patient.   Medication Assistance: None required.  Patient affirms current coverage meets needs.  Patient's preferred pharmacy is:  [employer] - [place], [place] [facility] [address] [place] [address] [place] [address] Phone: [phone] Fax: [place] [address] [employer], [address] [place] [address] Phone: [phone] Fax: [place] [facility] ([facility] ) - [place], [place] [address] Phone: [phone] Fax: [phone]  [id number] [id number]: [phone]: [phone] Uses pill box? No - has a system with her bottles which she states is working well for her Pt endorses 90% compliance  Follow Up:  Patient agrees to Care Plan and Follow-up.  Plan: Telephone follow up appointment with care management team member scheduled for:   626month KeLarinda ButteryPharmD Clinical Pharmacist [phone]

## 2021-02-14 ENCOUNTER — Other Ambulatory Visit: Payer: Self-pay

## 2021-02-14 ENCOUNTER — Encounter: Payer: Self-pay | Admitting: Physical Therapy

## 2021-02-14 ENCOUNTER — Ambulatory Visit: Payer: Medicare Other | Admitting: Physical Therapy

## 2021-02-14 DIAGNOSIS — M25611 Stiffness of right shoulder, not elsewhere classified: Secondary | ICD-10-CM

## 2021-02-14 DIAGNOSIS — G8929 Other chronic pain: Secondary | ICD-10-CM | POA: Diagnosis not present

## 2021-02-14 DIAGNOSIS — R600 Localized edema: Secondary | ICD-10-CM | POA: Diagnosis not present

## 2021-02-14 DIAGNOSIS — M25511 Pain in right shoulder: Secondary | ICD-10-CM | POA: Diagnosis not present

## 2021-02-14 NOTE — Therapy (Signed)
[facility] [place], [place], [address] Phone: [phone]   Fax:  [phone]  Physical Therapy Treatment  Patient Details  Name: Martha Perkins MRN: [id number] Date of Birth: Nov 15, 1945 Referring Provider (PT): Justice Britain MD   Encounter Date: 02/14/2021   PT End of Session - 02/14/21 1035     Visit Number 14    Number of Visits 16    Date for PT Re-Evaluation 02/21/21    Authorization Type FOTO AT LEAST EVERY 5TH VISIT.  PROGRESS NOTE AT 10TH VISIT.  KX MODIFIER AFTER 15 VISITS.    PT Start Time 1032    PT Stop Time 1122    PT Time Calculation (min) 50 min    Activity Tolerance Patient tolerated treatment well    Behavior During Therapy WFL for tasks assessed/performed             Past Medical History:  Diagnosis Date   Arthritis    neck, back, hips, knees, ankles   Asthma    use everyday   Cancer ([place]) 2019   skin   Dyspnea    Dysrhythmia    PVCs , Afib   Heart murmur    History of kidney stones    Hyperlipidemia    Hypertension    Hyperthyroidism 08/08/2017   radio active iodine x2   Hypothyroidism    now take synthroid   Paroxysmal atrial fibrillation ([place]) 08/09/2015   PONV (postoperative nausea and vomiting)     Past Surgical History:  Procedure Laterality Date   CARDIOVERSION N/A 08/05/2016   Procedure: CARDIOVERSION;  Surgeon: Skeet Latch, MD;  Location: [facility];  Service: Cardiovascular;  Laterality: N/A;   CATARACT EXTRACTION Left 09/2012   Dr. Bunnie Philips   LITHOTRIPSY     REPLACEMENT TOTAL KNEE BILATERAL  2011   REVERSE SHOULDER ARTHROPLASTY Left 07/19/2020   Procedure: REVERSE SHOULDER ARTHROPLASTY;  Surgeon: Justice Britain, MD;  Location: [facility];  Service: Orthopedics;  Laterality: Left;  163min   REVERSE SHOULDER ARTHROPLASTY Right 12/06/2020   Procedure: REVERSE SHOULDER ARTHROPLASTY;  Surgeon: Justice Britain, MD;  Location: [facility];  Service: Orthopedics;  Laterality:  Right;  122min   THYROIDECTOMY  1971   TUBAL LIGATION  1979    There were no vitals filed for this visit.   Subjective Assessment - 02/14/21 1032     Subjective COVID-19 screen performed prior to patient entering clinic. 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    Pertinent History HTN, thyroid problems, bilateral TKA's, left TSA.    Patient Stated Goals Use right UE with minimal pain.    Currently in Pain? Yes    Pain Score 4     Pain Location Shoulder    Pain Orientation Right    Pain Descriptors / Indicators Sore    Pain Type Surgical pain    Pain Onset More than a month ago    Pain Frequency Intermittent                [facility] PT Assessment - 02/14/21 0001       Assessment   Medical Diagnosis Reverse left total shoulder replacement    Referring Provider (PT) Justice Britain MD    Onset Date/Surgical Date 12/06/20    Hand Dominance Right      Precautions   Precaution Comments No ultrasound.  Per protocol.  [place] Adult PT Treatment/Exercise - 02/14/21 0001       Shoulder Exercises: Supine   Other Supine Exercises ABCs x1rep      Shoulder Exercises: Seated   Extension Strengthening;Right;20 reps;Theraband    Theraband Level (Shoulder Extension) Level 1 (Yellow)    Retraction Strengthening;Right;20 reps;Theraband    Theraband Level (Shoulder Retraction) Level 1 (Yellow)    Protraction AROM;Right;20 reps    External Rotation Strengthening;Right;20 reps;Theraband    Theraband Level (Shoulder External Rotation) Level 1 (Yellow)    Internal Rotation Strengthening;Right;20 reps;Theraband    Theraband Level (Shoulder Internal Rotation) Level 1 (Yellow)    Flexion AROM;Right;20 reps    Flexion Limitations upper cut, full flexion    Abduction AROM;Right;20 reps    ABduction Limitations elbow flexed      Shoulder Exercises: Pulleys   Flexion 5 minutes       Modalities   Modalities Electrical Stimulation      Electrical Stimulation   Electrical Stimulation Location R shoulder    Electrical Stimulation Action Pre-Mod    Electrical Stimulation Parameters 80-150 hz x10 min    Electrical Stimulation Goals Pain      Manual Therapy   Manual Therapy Passive ROM    Passive ROM PROM R shoulder with low load lond duration stretching for flexion/ER                          PT Long Term Goals - 01/29/21 1136       PT LONG TERM GOAL #1   Title Independent with a HEP.    Time 4    Period Weeks    Status On-going      PT LONG TERM GOAL #2   Title Active right shoulder flexion to 135 degrees so the patient can easily reach overhead.    Time 4    Period Weeks    Status On-going      PT LONG TERM GOAL #3   Title Active ER to 60 degrees+ to allow for easily donning/doffing of apparel.    Time 4    Period Weeks    Status On-going      PT LONG TERM GOAL #4   Title Increase right shoulder strength to a solid 4/5 to increase stability for performance of functional activities.    Time 4    Period Weeks    Status On-going      PT LONG TERM GOAL #5   Title Perform ADL's with pain not > 3/10.    Time 4    Period Weeks    Status On-going                   Plan - 02/14/21 1133     Clinical Impression Statement Patient tolerated today's treatment fairly well as she was progressed to full antigravity AROM and light strengthening. Mild discomfort reported with resisted ER/IR. Fatigue presents fairly quickly with all therex. Firm end feels and smooth arc of motion noted during PROM. Normal stimulation response noted following removal of the modality.    Personal Factors and Comorbidities Comorbidity 1    Comorbidities HTN, thyroid problems, bilateral TKA's, left TSA.    Examination-Activity Limitations Other;Reach Overhead    Stability/Clinical Decision Making Stable/Uncomplicated    Rehab Potential Good    PT Frequency  2x / week    PT Duration 4 weeks    PT Treatment/Interventions ADLs/Self Care Home Management;Cryotherapy;Electrical Stimulation;Therapeutic activities;Therapeutic exercise;Neuromuscular re-education;Manual techniques;Patient/family education;Passive  range of motion;Vasopneumatic Device    PT Next Visit Plan Progress light strengthening and functional ROM.    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   Aortic atherosclerosis ([place]) 12/26/2019   Palpitations 10/21/2019   Morbid obesity ([place]) 10/21/2019   Encounter for chronic pain management 06/30/2019   Hypothyroidism 09/11/2016   History of atrial fibrillation    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/14/2016   Restrictive lung disease 09/28/2015   Lumbar degenerative disc disease 08/15/2015   PAF (paroxysmal atrial fibrillation) ([place]) 08/09/2015   Chronic allergic rhinitis 05/17/2014   Cataract 06/03/2012   Hyperlipidemia LDL goal <100 12/21/2010   Essential hypertension 12/21/2010   GERD (gastroesophageal reflux disease) 07/18/2010   Insulin resistance 07/18/2010   Fatty liver 07/18/2010    Standley Brooking, PTA 02/14/2021, 11:40 AM  [facility] [address] [place], [place], [address] Phone: [phone]   Fax:  [phone]  Name: Martha Perkins MRN: [id number] Date of Birth: 1945/04/23

## 2021-02-18 ENCOUNTER — Ambulatory Visit: Payer: Medicare Other | Admitting: Physical Therapy

## 2021-02-18 ENCOUNTER — Encounter: Payer: Self-pay | Admitting: Physical Therapy

## 2021-02-18 ENCOUNTER — Other Ambulatory Visit: Payer: Self-pay

## 2021-02-18 DIAGNOSIS — M25611 Stiffness of right shoulder, not elsewhere classified: Secondary | ICD-10-CM | POA: Diagnosis not present

## 2021-02-18 DIAGNOSIS — G8929 Other chronic pain: Secondary | ICD-10-CM

## 2021-02-18 DIAGNOSIS — R600 Localized edema: Secondary | ICD-10-CM

## 2021-02-18 DIAGNOSIS — M25511 Pain in right shoulder: Secondary | ICD-10-CM | POA: Diagnosis not present

## 2021-02-18 NOTE — Therapy (Signed)
[facility] [place], [place], [address] Phone: [phone]   Fax:  [phone]  Physical Therapy Treatment  Patient Details  Name: Martha Perkins MRN: [id number] Date of Birth: 18-Aug-1945 Referring Provider (PT): Justice Britain MD   Encounter Date: 02/18/2021   PT End of Session - 02/18/21 1036     Visit Number 15    Number of Visits 16    Date for PT Re-Evaluation 02/21/21    Authorization Type FOTO AT LEAST EVERY 5TH VISIT.  PROGRESS NOTE AT 10TH VISIT.  KX MODIFIER AFTER 15 VISITS.    PT Start Time 1031    PT Stop Time 1113    PT Time Calculation (min) 42 min    Activity Tolerance Patient tolerated treatment well    Behavior During Therapy WFL for tasks assessed/performed             Past Medical History:  Diagnosis Date   Arthritis    neck, back, hips, knees, ankles   Asthma    use everyday   Cancer ([place]) 2019   skin   Dyspnea    Dysrhythmia    PVCs , Afib   Heart murmur    History of kidney stones    Hyperlipidemia    Hypertension    Hyperthyroidism 08/08/2017   radio active iodine x2   Hypothyroidism    now take synthroid   Paroxysmal atrial fibrillation ([place]) 08/09/2015   PONV (postoperative nausea and vomiting)     Past Surgical History:  Procedure Laterality Date   CARDIOVERSION N/A 08/05/2016   Procedure: CARDIOVERSION;  Surgeon: Skeet Latch, MD;  Location: [facility];  Service: Cardiovascular;  Laterality: N/A;   CATARACT EXTRACTION Left 09/2012   Dr. Bunnie Philips   LITHOTRIPSY     REPLACEMENT TOTAL KNEE BILATERAL  2011   REVERSE SHOULDER ARTHROPLASTY Left 07/19/2020   Procedure: REVERSE SHOULDER ARTHROPLASTY;  Surgeon: Justice Britain, MD;  Location: [facility];  Service: Orthopedics;  Laterality: Left;  158min   REVERSE SHOULDER ARTHROPLASTY Right 12/06/2020   Procedure: REVERSE SHOULDER ARTHROPLASTY;  Surgeon: Justice Britain, MD;  Location: [facility];  Service: Orthopedics;  Laterality:  Right;  168min   THYROIDECTOMY  1971   TUBAL LIGATION  1979    There were no vitals filed for this visit.   Subjective Assessment - 02/18/21 1032     Subjective COVID-19 screen performed prior to patient entering clinic. Used pain medication prior to recital on Saturday and was sore afterwards.    Pertinent History HTN, thyroid problems, bilateral TKA's, left TSA.    Patient Stated Goals Use right UE with minimal pain.    Currently in Pain? Yes    Pain Location Shoulder    Pain Orientation Right    Pain Descriptors / Indicators Sore    Pain Type Surgical pain    Pain Onset More than a month ago    Pain Frequency Intermittent                [facility] PT Assessment - 02/18/21 0001       Assessment   Medical Diagnosis Reverse left total shoulder replacement    Referring Provider (PT) Justice Britain MD    Onset Date/Surgical Date 12/06/20    Hand Dominance Right      Precautions   Precaution Comments No ultrasound.  Per protocol.  [facility] Adult PT Treatment/Exercise - 02/18/21 0001       Elbow Exercises   Elbow Flexion Strengthening;Right;20 reps;10 reps;Seated;Bar weights/barbell   3#     Shoulder Exercises: Seated   Extension Strengthening;Right;20 reps;10 reps;Theraband    Theraband Level (Shoulder Extension) Level 1 (Yellow)    Retraction Strengthening;Right;20 reps;10 reps;Theraband    Theraband Level (Shoulder Retraction) Level 1 (Yellow)    Protraction Strengthening;Right;20 reps;Theraband    Theraband Level (Shoulder Protraction) Level 1 (Yellow)    External Rotation Strengthening;Right;20 reps;Theraband    Theraband Level (Shoulder External Rotation) Level 1 (Yellow)    Internal Rotation Strengthening;Right;20 reps;Theraband    Theraband Level (Shoulder Internal Rotation) Level 1 (Yellow)    Flexion AROM;Right;20 reps;10 reps    Flexion Limitations upper cut, full flexion    Abduction AROM;Right;20 reps;10 reps     ABduction Limitations elbow flexed      Shoulder Exercises: ROM/Strengthening   [place] into flex x20 reps      Modalities   Modalities Vasopneumatic      Vasopneumatic   Number Minutes Vasopneumatic  15 minutes    Vasopnuematic Location  Shoulder    Vasopneumatic Pressure Low    Vasopneumatic Temperature  34/pain                          PT Long Term Goals - 02/18/21 1111       PT LONG TERM GOAL #1   Title Independent with a HEP.    Time 4    Period Weeks    Status Achieved      PT LONG TERM GOAL #2   Title Active right shoulder flexion to 135 degrees so the patient can easily reach overhead.    Time 4    Period Weeks    Status On-going      PT LONG TERM GOAL #3   Title Active ER to 60 degrees+ to allow for easily donning/doffing of apparel.    Time 4    Period Weeks    Status On-going      PT LONG TERM GOAL #4   Title Increase right shoulder strength to a solid 4/5 to increase stability for performance of functional activities.    Time 4    Period Weeks    Status On-going      PT LONG TERM GOAL #5   Title Perform ADL's with pain not > 3/10.    Time 4    Period Weeks    Status Achieved                   Plan - 02/18/21 1158     Clinical Impression Statement Patient presented in clinic with reports of some soreness after signing at a recent recital. Patient progressed with AROM and resistance training. Greatest limitation is weakness of ERs and fatigue with activity. Patient requires some assist with donning coats but has handweights at home to use. Normal vasopneumatic response noted following removal of the modality.    Personal Factors and Comorbidities Comorbidity 1    Comorbidities HTN, thyroid problems, bilateral TKA's, left TSA.    Examination-Activity Limitations Other;Reach Overhead    Examination-Participation Restrictions Other    Stability/Clinical Decision Making Stable/Uncomplicated    Rehab Potential Good    PT  Frequency 2x / week    PT Duration 4 weeks    PT Treatment/Interventions ADLs/Self Care Home Management;Cryotherapy;Electrical Stimulation;Therapeutic activities;Therapeutic exercise;Neuromuscular re-education;Manual techniques;Patient/family education;Passive range of motion;Vasopneumatic Device  PT Next Visit Plan Progress light strengthening and functional ROM.    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   Aortic atherosclerosis ([place]) 12/26/2019   Palpitations 10/21/2019   Morbid obesity ([place]) 10/21/2019   Encounter for chronic pain management 06/30/2019   Hypothyroidism 09/11/2016   History of atrial fibrillation    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/14/2016   Restrictive lung disease 09/28/2015   Lumbar degenerative disc disease 08/15/2015   PAF (paroxysmal atrial fibrillation) ([place]) 08/09/2015   Chronic allergic rhinitis 05/17/2014   Cataract 06/03/2012   Hyperlipidemia LDL goal <100 12/21/2010   Essential hypertension 12/21/2010   GERD (gastroesophageal reflux disease) 07/18/2010   Insulin resistance 07/18/2010   Fatty liver 07/18/2010    Standley Brooking, PTA 02/18/2021, 12:03 PM  [facility] [address] [place], [place], [address] Phone: [phone]   Fax:  [phone]  Name: Martha Perkins MRN: [id number] Date of Birth: 25-Oct-1945

## 2021-02-21 ENCOUNTER — Other Ambulatory Visit: Payer: Self-pay | Admitting: Family Medicine

## 2021-02-22 ENCOUNTER — Other Ambulatory Visit: Payer: Self-pay

## 2021-02-22 ENCOUNTER — Ambulatory Visit: Payer: Medicare Other | Admitting: Physical Therapy

## 2021-02-22 ENCOUNTER — Encounter: Payer: Self-pay | Admitting: Physical Therapy

## 2021-02-22 DIAGNOSIS — M25611 Stiffness of right shoulder, not elsewhere classified: Secondary | ICD-10-CM | POA: Diagnosis not present

## 2021-02-22 DIAGNOSIS — G8929 Other chronic pain: Secondary | ICD-10-CM | POA: Diagnosis not present

## 2021-02-22 DIAGNOSIS — R600 Localized edema: Secondary | ICD-10-CM | POA: Diagnosis not present

## 2021-02-22 DIAGNOSIS — M25511 Pain in right shoulder: Secondary | ICD-10-CM | POA: Diagnosis not present

## 2021-02-22 NOTE — Therapy (Signed)
[facility] [place], [place], [address] Phone: [phone]   Fax:  [phone]  Physical Therapy Treatment  Patient Details  Name: Martha Perkins MRN: [id number] Date of Birth: Nov 24, 1945 Referring Provider (PT): Justice Britain MD   Encounter Date: 02/22/2021   PT End of Session - 02/22/21 1042     Visit Number 16    Number of Visits 16    Date for PT Re-Evaluation 02/21/21    Authorization Type FOTO AT LEAST EVERY 5TH VISIT.  PROGRESS NOTE AT 10TH VISIT.  KX MODIFIER AFTER 15 VISITS.    PT Start Time 1034    PT Stop Time 1118    PT Time Calculation (min) 44 min    Activity Tolerance Patient tolerated treatment well    Behavior During Therapy WFL for tasks assessed/performed             Past Medical History:  Diagnosis Date   Arthritis    neck, back, hips, knees, ankles   Asthma    use everyday   Cancer ([place]) 2019   skin   Dyspnea    Dysrhythmia    PVCs , Afib   Heart murmur    History of kidney stones    Hyperlipidemia    Hypertension    Hyperthyroidism 08/08/2017   radio active iodine x2   Hypothyroidism    now take synthroid   Paroxysmal atrial fibrillation ([place]) 08/09/2015   PONV (postoperative nausea and vomiting)     Past Surgical History:  Procedure Laterality Date   CARDIOVERSION N/A 08/05/2016   Procedure: CARDIOVERSION;  Surgeon: Skeet Latch, MD;  Location: [facility];  Service: Cardiovascular;  Laterality: N/A;   CATARACT EXTRACTION Left 09/2012   Dr. Bunnie Philips   LITHOTRIPSY     REPLACEMENT TOTAL KNEE BILATERAL  2011   REVERSE SHOULDER ARTHROPLASTY Left 07/19/2020   Procedure: REVERSE SHOULDER ARTHROPLASTY;  Surgeon: Justice Britain, MD;  Location: [facility];  Service: Orthopedics;  Laterality: Left;  164mn   REVERSE SHOULDER ARTHROPLASTY Right 12/06/2020   Procedure: REVERSE SHOULDER ARTHROPLASTY;  Surgeon: SJustice Britain MD;  Location[facility];  Service: Orthopedics;  Laterality:  Right;  1228m   THYROIDECTOMY  1971   TUBAL LIGATION  1979    There were no vitals filed for this visit.   Subjective Assessment - 02/22/21 1040     Subjective COVID-19 screen performed prior to patient entering clinic. Aggravated B shoulders with hasseling with seat belt.    Pertinent History HTN, thyroid problems, bilateral TKA's, left TSA.    Patient Stated Goals Use right UE with minimal pain.    Currently in Pain? Yes    Pain Score 7     Pain Location Shoulder    Pain Orientation Right    Pain Descriptors / Indicators Discomfort    Pain Type Surgical pain    Pain Onset More than a month ago    Pain Frequency Intermittent    Multiple Pain Sites Yes    Pain Score 5    Pain Location Shoulder    Pain Orientation Left    Pain Descriptors / Indicators Discomfort    Pain Type Chronic pain    Pain Onset More than a month ago    Pain Frequency Constant    Pain Score 6    Pain Location Back    Pain Orientation Lower    Pain Descriptors / Indicators Discomfort    Pain Type Chronic pain    Pain Onset  Today    Pain Frequency Constant                [facility] PT Assessment - 02/22/21 0001       Assessment   Medical Diagnosis Reverse left total shoulder replacement    Referring Provider (PT) Justice Britain MD    Onset Date/Surgical Date 12/06/20    Hand Dominance Right      Precautions   Precaution Comments No ultrasound.  Per protocol.      ROM / Strength   AROM / PROM / Strength AROM      AROM   AROM Assessment Site Shoulder    Right/Left Shoulder Right                           [facility] Adult PT Treatment/Exercise - 02/22/21 0001       Shoulder Exercises: Pulleys   Flexion 5 minutes                          PT Long Term Goals - 02/22/21 1047       PT LONG TERM GOAL #1   Title Independent with a HEP.    Time 4    Period Weeks    Status Achieved      PT LONG TERM GOAL #2   Title Active right shoulder flexion to 135  degrees so the patient can easily reach overhead.    Time 4    Period Weeks    Status Achieved      PT LONG TERM GOAL #3   Title Active ER to 60 degrees+ to allow for easily donning/doffing of apparel.    Time 4    Period Weeks    Status Not Met   AROM 40 deg in supine     PT LONG TERM GOAL #4   Title Increase right shoulder strength to a solid 4/5 to increase stability for performance of functional activities.    Time 4    Period Weeks    Status Unable to assess   due to pain     PT LONG TERM GOAL #5   Title Perform ADLs with pain not > 3/10.    Time 4    Period Weeks    Status Achieved                   Plan - 02/22/21 1234     Clinical Impression Statement Patient presented in overall increased pain today after trying to fix seat belt. Patient was limited with any therex due to pain and instead focus was on discharge necessities and PROM to calm pain. Intermittant osciallations provide to control pain during PROM. Patient limited with MMT goal as could not be assessed due to pain. Most goals achieved during PT though. Normal vasopneumatic response noted following removal of the modality.    Personal Factors and Comorbidities Comorbidity 1    Comorbidities HTN, thyroid problems, bilateral TKA's, left TSA.    Examination-Activity Limitations Other;Reach Overhead    Examination-Participation Restrictions Other    Stability/Clinical Decision Making Stable/Uncomplicated    Rehab Potential Good    PT Frequency 2x / week    PT Duration 4 weeks    PT Treatment/Interventions ADLs/Self Care Home Management;Cryotherapy;Electrical Stimulation;Therapeutic activities;Therapeutic exercise;Neuromuscular re-education;Manual techniques;Patient/family education;Passive range of motion;Vasopneumatic Device    PT Next Visit Plan D/C    Consulted and Agree with Plan of Care Patient  Patient will benefit from skilled therapeutic intervention in order to improve the  following deficits and impairments:  Pain, Decreased activity tolerance, Increased edema, Decreased range of motion  Visit Diagnosis: Stiffness of right shoulder, not elsewhere classified  Chronic right shoulder pain  Localized edema     Problem List Patient Active Problem List   Diagnosis Date Noted   Venous insufficiency of both lower extremities 10/03/2020   S/P reverse total shoulder arthroplasty, 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   Aortic atherosclerosis ([place]) 12/26/2019   Palpitations 10/21/2019   Morbid obesity ([place]) 10/21/2019   Encounter for chronic pain management 06/30/2019   Hypothyroidism 09/11/2016   History of atrial fibrillation    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/14/2016   Restrictive lung disease 09/28/2015   Lumbar degenerative disc disease 08/15/2015   PAF (paroxysmal atrial fibrillation) ([place]) 08/09/2015   Chronic allergic rhinitis 05/17/2014   Cataract 06/03/2012   Hyperlipidemia LDL goal <100 12/21/2010   Essential hypertension 12/21/2010   GERD (gastroesophageal reflux disease) 07/18/2010   Insulin resistance 07/18/2010   Fatty liver 07/18/2010    Standley Brooking, PTA 02/22/2021, 12:36 PM  [facility] [address] [place], [place], [address] Phone: [phone]   Fax:  [phone]  Name: KEANU FRICKEY MRN: [id number] Date of Birth: 06/16/1945

## 2021-02-22 NOTE — Therapy (Addendum)
[facility] [place], [place], [address] Phone: [phone]   Fax:  [phone]  Physical Therapy Treatment  Patient Details  Name: Martha Perkins MRN: [id number] Date of Birth: Nov 24, 1945 Referring Provider (PT): Justice Britain MD   Encounter Date: 02/22/2021   PT End of Session - 02/22/21 1042     Visit Number 16    Number of Visits 16    Date for PT Re-Evaluation 02/21/21    Authorization Type FOTO AT LEAST EVERY 5TH VISIT.  PROGRESS NOTE AT 10TH VISIT.  KX MODIFIER AFTER 15 VISITS.    PT Start Time 1034    PT Stop Time 1118    PT Time Calculation (min) 44 min    Activity Tolerance Patient tolerated treatment well    Behavior During Therapy WFL for tasks assessed/performed             Past Medical History:  Diagnosis Date   Arthritis    neck, back, hips, knees, ankles   Asthma    use everyday   Cancer ([place]) 2019   skin   Dyspnea    Dysrhythmia    PVCs , Afib   Heart murmur    History of kidney stones    Hyperlipidemia    Hypertension    Hyperthyroidism 08/08/2017   radio active iodine x2   Hypothyroidism    now take synthroid   Paroxysmal atrial fibrillation ([place]) 08/09/2015   PONV (postoperative nausea and vomiting)     Past Surgical History:  Procedure Laterality Date   CARDIOVERSION N/A 08/05/2016   Procedure: CARDIOVERSION;  Surgeon: Skeet Latch, MD;  Location: [facility];  Service: Cardiovascular;  Laterality: N/A;   CATARACT EXTRACTION Left 09/2012   Dr. Bunnie Philips   LITHOTRIPSY     REPLACEMENT TOTAL KNEE BILATERAL  2011   REVERSE SHOULDER ARTHROPLASTY Left 07/19/2020   Procedure: REVERSE SHOULDER ARTHROPLASTY;  Surgeon: Justice Britain, MD;  [facility];  Service: Orthopedics;  Laterality: Left;  164mn   REVERSE SHOULDER ARTHROPLASTY Right 12/06/2020   Procedure: REVERSE SHOULDER ARTHROPLASTY;  Surgeon: SJustice Britain MD;  Location: [facility];  Service: Orthopedics;  Laterality:  Right;  1228m   THYROIDECTOMY  1971   TUBAL LIGATION  1979    There were no vitals filed for this visit.   Subjective Assessment - 02/22/21 1040     Subjective COVID-19 screen performed prior to patient entering clinic. Aggravated B shoulders with hasseling with seat belt.    Pertinent History HTN, thyroid problems, bilateral TKA's, left TSA.    Patient Stated Goals Use right UE with minimal pain.    Currently in Pain? Yes    Pain Score 7     Pain Location Shoulder    Pain Orientation Right    Pain Descriptors / Indicators Discomfort    Pain Type Surgical pain    Pain Onset More than a month ago    Pain Frequency Intermittent    Multiple Pain Sites Yes    Pain Score 5    Pain Location Shoulder    Pain Orientation Left    Pain Descriptors / Indicators Discomfort    Pain Type Chronic pain    Pain Onset More than a month ago    Pain Frequency Constant    Pain Score 6    Pain Location Back    Pain Orientation Lower    Pain Descriptors / Indicators Discomfort    Pain Type Chronic pain    Pain Onset  Today    Pain Frequency Constant                [facility] PT Assessment - 02/22/21 0001       Assessment   Medical Diagnosis Reverse left total shoulder replacement    Referring Provider (PT) Justice Britain MD    Onset Date/Surgical Date 12/06/20    Hand Dominance Right      Precautions   Precaution Comments No ultrasound.  Per protocol.      Observation/Other Assessments   Focus on Therapeutic Outcomes (FOTO)  36% limitation 16th visit      ROM / Strength   AROM / PROM / Strength AROM      AROM   Overall AROM  Deficits;Due to pain    AROM Assessment Site Shoulder    Right/Left Shoulder Right    Right Shoulder Flexion 135 Degrees    Right Shoulder External Rotation 40 Degrees                           [facility] Adult PT Treatment/Exercise - 02/22/21 0001       Shoulder Exercises: Pulleys   Flexion 5 minutes      Modalities   Modalities  Vasopneumatic      Vasopneumatic   Number Minutes Vasopneumatic  10 minutes    Vasopnuematic Location  Shoulder    Vasopneumatic Pressure Low    Vasopneumatic Temperature  34/pain      Manual Therapy   Manual Therapy Passive ROM    Passive ROM PROM R shoulder with low load lond duration stretching for flexion/ER                          PT Long Term Goals - 02/22/21 1047       PT LONG TERM GOAL #1   Title Independent with a HEP.    Time 4    Period Weeks    Status Achieved      PT LONG TERM GOAL #2   Title Active right shoulder flexion to 135 degrees so the patient can easily reach overhead.    Time 4    Period Weeks    Status Achieved      PT LONG TERM GOAL #3   Title Active ER to 60 degrees+ to allow for easily donning/doffing of apparel.    Time 4    Period Weeks    Status Not Met   AROM 40 deg in supine     PT LONG TERM GOAL #4   Title Increase right shoulder strength to a solid 4/5 to increase stability for performance of functional activities.    Time 4    Period Weeks    Status Unable to assess   due to pain     PT LONG TERM GOAL #5   Title Perform ADLs with pain not > 3/10.    Time 4    Period Weeks    Status Achieved                   Plan - 02/22/21 1234     Clinical Impression Statement Patient presented in overall increased pain today after trying to fix seat belt. Patient was limited with any therex due to pain and instead focus was on discharge necessities and PROM to calm pain. Intermittant osciallations provide to control pain during PROM. Patient limited with MMT goal as could not be  assessed due to pain. Most goals achieved during PT though. Normal vasopneumatic response noted following removal of the modality.    Personal Factors and Comorbidities Comorbidity 1    Comorbidities HTN, thyroid problems, bilateral TKA's, left TSA.    Examination-Activity Limitations Other;Reach Overhead    Examination-Participation  Restrictions Other    Stability/Clinical Decision Making Stable/Uncomplicated    Rehab Potential Good    PT Frequency 2x / week    PT Duration 4 weeks    PT Treatment/Interventions ADLs/Self Care Home Management;Cryotherapy;Electrical Stimulation;Therapeutic activities;Therapeutic exercise;Neuromuscular re-education;Manual techniques;Patient/family education;Passive range of motion;Vasopneumatic Device    PT Next Visit Plan D/C    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   Aortic atherosclerosis ([place]) 12/26/2019   Palpitations 10/21/2019   Morbid obesity ([place]) 10/21/2019   Encounter for chronic pain management 06/30/2019   Hypothyroidism 09/11/2016   History of atrial fibrillation    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/14/2016   Restrictive lung disease 09/28/2015   Lumbar degenerative disc disease 08/15/2015   PAF (paroxysmal atrial fibrillation) ([place]) 08/09/2015   Chronic allergic rhinitis 05/17/2014   Cataract 06/03/2012   Hyperlipidemia LDL goal <100 12/21/2010   Essential hypertension 12/21/2010   GERD (gastroesophageal reflux disease) 07/18/2010   Insulin resistance 07/18/2010   Fatty liver 07/18/2010    Standley Brooking, PTA 02/22/2021, 12:40 PM  [facility] [address] [place], [place], [address] Phone: [phone]   Fax:  [phone]  Name: Martha Perkins MRN: [id number] Date of Birth:  08/31/1945     PHYSICAL THERAPY DISCHARGE SUMMARY  Visits from Start of Care: 16.  Current functional level related to goals / functional outcomes: See above.   Remaining deficits: See goal section.   Education / Equipment: HEP.   Patient agrees to discharge. Patient goals were partially met. Patient is being discharged due to being pleased with the current functional level.      Mali Applegate MPT

## 2021-02-26 ENCOUNTER — Other Ambulatory Visit: Payer: Self-pay | Admitting: Family Medicine

## 2021-02-26 DIAGNOSIS — M5136 Other intervertebral disc degeneration, lumbar region: Secondary | ICD-10-CM

## 2021-02-26 DIAGNOSIS — G8929 Other chronic pain: Secondary | ICD-10-CM

## 2021-02-27 DIAGNOSIS — Z471 Aftercare following joint replacement surgery: Secondary | ICD-10-CM | POA: Diagnosis not present

## 2021-02-27 DIAGNOSIS — Z96611 Presence of right artificial shoulder joint: Secondary | ICD-10-CM | POA: Diagnosis not present

## 2021-03-05 ENCOUNTER — Other Ambulatory Visit: Payer: Self-pay

## 2021-03-05 DIAGNOSIS — I10 Essential (primary) hypertension: Secondary | ICD-10-CM

## 2021-03-05 MED ORDER — METOPROLOL SUCCINATE ER 50 MG PO TB24: 100.0000 mg | ORAL_TABLET | Freq: Every day | ORAL | 3 refills | Status: DC

## 2021-03-05 NOTE — Telephone Encounter (Signed)
Pt called stating that [facility] needs new RX for metoprolol to reflect that she is now taking 2 tablets daily rather than 1 tablet daily.  RX sent to pharmacy.  Charyl Bigger, CMA

## 2021-03-15 ENCOUNTER — Encounter: Payer: Self-pay | Admitting: Family Medicine

## 2021-03-15 ENCOUNTER — Other Ambulatory Visit: Payer: Self-pay

## 2021-03-15 ENCOUNTER — Ambulatory Visit (INDEPENDENT_AMBULATORY_CARE_PROVIDER_SITE_OTHER): Payer: Medicare Other | Admitting: Family Medicine

## 2021-03-15 VITALS — BP 126/80 | HR 67 | Temp 97.8°F | Ht 62.0 in | Wt 213.1 lb

## 2021-03-15 DIAGNOSIS — R600 Localized edema: Secondary | ICD-10-CM

## 2021-03-15 NOTE — Progress Notes (Signed)
Acute Office Visit  Subjective:    Patient ID: Martha Perkins, female    DOB: [birth year], 76 y.o.   MRN: [id number]  Chief Complaint  Patient presents with   Edema    bilateral    HPI Patient is in today for bilateral lower extremity edema.   She reports that around Christmas she started noticing her legs were swelling more than usual and starting to weep. They can be sore at times from the tightness. She reports the weeping comes and goes. Reports that she tried to keep her legs elevated when possible, but she has been very busy lately, up on her feet a lot with the holidays. States that because the lasix makes her void so much, she avoids it on day when she has places to go - admits to only taking her lasix 2-3 days per week lately. She denies any chest pain, shortness of breath, wheezing, coughing, abdominal distention, rashes, fevers.     Past Medical History:  Diagnosis Date   Arthritis    neck, back, hips, knees, ankles   Asthma    use everyday   Cancer ([place]) 2019   skin   Dyspnea    Dysrhythmia    PVCs , Afib   Heart murmur    History of kidney stones    Hyperlipidemia    Hypertension    Hyperthyroidism 08/08/2017   radio active iodine x2   Hypothyroidism    now take synthroid   Paroxysmal atrial fibrillation ([place]) 08/09/2015   PONV (postoperative nausea and vomiting)     Past Surgical History:  Procedure Laterality Date   CARDIOVERSION N/A 08/05/2016   Procedure: CARDIOVERSION;  Surgeon: Skeet Latch, MD;  Location: [place];  Service: Cardiovascular;  Laterality: N/A;   CATARACT EXTRACTION Left 09/2012   Dr. Bunnie Philips   LITHOTRIPSY     REPLACEMENT TOTAL KNEE BILATERAL  2011   REVERSE SHOULDER ARTHROPLASTY Left 07/19/2020   Procedure: REVERSE SHOULDER ARTHROPLASTY;  Surgeon: Justice Britain, MD;  Location: [facility];  Service: Orthopedics;  Laterality: Left;  16mn   REVERSE SHOULDER ARTHROPLASTY Right 12/06/2020   Procedure: REVERSE SHOULDER  ARTHROPLASTY;  Surgeon: SJustice Britain MD;  Location: [facility];  Service: Orthopedics;  Laterality: Right;  1266m   THYROIDECTOMY  1971   TUBAL LIGATION  1979    Family History  Problem Relation Age of Onset   Coronary artery disease Mother 6554     deceased   Depression Mother    Hyperlipidemia Mother    Diabetes Paternal Grandmother    Thyroid disease Sister    COPD Father    Breast cancer Neg Hx     Social History   Socioeconomic History   Marital status: Married    Spouse name: StRemo Lipps Number of children: 2   Years of education: 16   Highest education level: Bachelor's degree (e.g., BA, AB, BS)  Occupational History   Occupation: Retired RNTherapist, sports  Tobacco Use   Smoking status: Never   Smokeless tobacco: Never  Vaping Use   Vaping Use: Never used  Substance and Sexual Activity   Alcohol use: No   Drug use: No   Sexual activity: Not Currently  Other Topics Concern   Not on file  Social History Narrative   Stay around the house. Go to grocery store.    Social Determinants of Health   Financial Resource Strain: Not on file  Food Insecurity: Not on file  Transportation Needs: Not  on file  Physical Activity: Not on file  Stress: Not on file  Social Connections: Not on file  Intimate Partner Violence: Not on file    Outpatient Medications Prior to Visit  Medication Sig Dispense Refill   acetaminophen (TYLENOL) 500 MG tablet Take 500 mg by mouth every 6 (six) hours as needed for moderate pain or headache.     albuterol (VENTOLIN HFA) 108 (90 Base) MCG/ACT inhaler INHALE TWO PUFFS INTO THE LUNGS EVERY 6 HOURS AS NEEDED FOR WHEEZING OR SHORTNESS OF BREATH 8.5 g 2   atorvastatin (LIPITOR) 40 MG tablet Take 1 tablet (40 mg total) by mouth at bedtime. 90 tablet 3   benzonatate (TESSALON) 100 MG capsule Take 1 capsule (100 mg total) by mouth 2 (two) times daily as needed for cough. 20 capsule 0   Biotin 1000 MCG tablet Take 1,000 mcg by mouth daily.     Cholecalciferol  (VITAMIN D) 125 MCG (5000 UT) CAPS Take 5,000 Units by mouth daily. With K2     clotrimazole-betamethasone (LOTRISONE) cream Apply 1 application topically 2 (two) times daily. (Patient taking differently: Apply 1 application topically 2 (two) times daily as needed (fungus).) 45 g 2   Coenzyme Q10 (COQ-10) 100 MG CAPS Take 100 mg by mouth daily.     cyclobenzaprine (FLEXERIL) 10 MG tablet Take 1 tablet (10 mg total) by mouth 3 (three) times daily as needed for muscle spasms. 30 tablet 1   cyclobenzaprine (FLEXERIL) 5 MG tablet Take 1 tablet (5 mg total) by mouth at bedtime as needed for muscle spasms. 30 tablet 1   fluticasone (FLONASE) 50 MCG/ACT nasal spray USE 1 SPRAY IN BOTH  NOSTRILS TWICE DAILY 48 g 3   Fluticasone Furoate (ARNUITY ELLIPTA) 100 MCG/ACT AEPB Inhale 1 Inhaler into the lungs daily. 30 each 2   furosemide (LASIX) 20 MG tablet TAKE 3 TABLETS BY MOUTH  DAILY 90 tablet 11   ketoconazole (NIZORAL) 2 % cream Apply topically daily as needed for irritation. (Patient taking differently: Apply 1 application topically daily as needed for irritation.) 60 g 3   levothyroxine (SYNTHROID) 150 MCG tablet TAKE 1 TABLET BY MOUTH  DAILY BEFORE BREAKFAST TAKE EXTRA ONE-HALF TABLET BY  MOUTH 1 DAY WEEK (Patient taking differently: Take 150-225 mcg by mouth daily before breakfast. TAKE 1 TABLET BY MOUTH  DAILY BEFORE BREAKFAST TAKE EXTRA ONE-HALF TABLET BY  MOUTH 1 DAY WEEK 225 mcg on Saturday and 150 mcg all other days) 96 tablet 3   loratadine (CLARITIN) 10 MG tablet Take 10 mg by mouth at bedtime.     Magnesium Citrate 200 MG TABS Take 200 mg by mouth daily.     meloxicam (MOBIC) 15 MG tablet TAKE 1 TABLET BY MOUTH  DAILY 90 tablet 3   metoprolol succinate (TOPROL-XL) 50 MG 24 hr tablet Take 2 tablets (100 mg total) by mouth daily. TAKE 2 TABLETS BY MOUTH  DAILY WITH OR IMMEDIATELY  FOLLOWING A MEAL 90 tablet 3   nirmatrelvir/ritonavir EUA (PAXLOVID) 20 x 150 MG & 10 x 100MG TABS Take 3 tablets by  mouth 2 (two) times daily. Use as directed per package insert.  May use any available formulation at pharmacy with the same total dosage. 30 tablet 0   ondansetron (ZOFRAN) 4 MG tablet Take 1 tablet (4 mg total) by mouth every 8 (eight) hours as needed for nausea or vomiting. 10 tablet 0   oxyCODONE-acetaminophen (PERCOCET) 5-325 MG tablet Take 0.5-1 tablets by mouth every 6 (six)  hours as needed for severe pain. 10 tablet 0   potassium chloride (KLOR-CON) 10 MEQ tablet TAKE 1 TABLET BY MOUTH  DAILY AS NEEDED (Patient taking differently: Take 10 mEq by mouth daily as needed (Takes with Lasix).) 90 tablet 3   traMADol (ULTRAM) 50 MG tablet TAKE ONE TABLET BY MOUTH THREE TIMES DAILY AS NEEDED 90 tablet 0   TURMERIC PO Take 1,500 mg by mouth daily.     valsartan (DIOVAN) 160 MG tablet Take 160 mg by mouth every evening.     vitamin B-12 (CYANOCOBALAMIN) 1000 MCG tablet Take 1,000 mcg by mouth daily.     vitamin C (ASCORBIC ACID) 500 MG tablet Take 500 mg by mouth 2 (two) times daily.     Zinc 50 MG CAPS Take 50 mg by mouth daily.     No facility-administered medications prior to visit.    Allergies  Allergen Reactions   Meperidine And Related Nausea And Vomiting   Oxycodone     loopy   Percocet [Oxycodone-Acetaminophen]     Feels loopy   Demerol Nausea And Vomiting    Review of Systems All review of systems negative except what is listed in the HPI     Objective:    Physical Exam Vitals reviewed.  Constitutional:      Appearance: Normal appearance.  Cardiovascular:     Rate and Rhythm: Normal rate and regular rhythm.  Pulmonary:     Effort: Pulmonary effort is normal.     Breath sounds: Normal breath sounds. No rales.  Abdominal:     Palpations: Abdomen is soft.  Musculoskeletal:     Right lower leg: Edema present.     Left lower leg: Edema present.     Comments: BLE edema 2+, no weeping visible at visit today  Skin:    General: Skin is warm and dry.     Findings: No  lesion.  Neurological:     General: No focal deficit present.     Mental Status: She is alert and oriented to person, place, and time. Mental status is at baseline.  Psychiatric:        Mood and Affect: Mood normal.        Behavior: Behavior normal.        Thought Content: Thought content normal.        Judgment: Judgment normal.    BP 126/80 (BP Location: Left Arm, Patient Position: Sitting, Cuff Size: Large)    Pulse 67    Temp 97.8 F (36.6 C) (Oral)    Ht 5' 2"  (1.575 m)    Wt 213 lb 1.9 oz (96.7 kg)    SpO2 99%    BMI 38.98 kg/m  Wt Readings from Last 3 Encounters:  03/15/21 213 lb 1.9 oz (96.7 kg)  12/06/20 208 lb 1.1 oz (94.4 kg)  12/06/20 208 lb 1.1 oz (94.4 kg)    There are no preventive care reminders to display for this patient.   There are no preventive care reminders to display for this patient.   Lab Results  Component Value Date   TSH 0.46 07/10/2020   Lab Results  Component Value Date   WBC 12.3 (H) 12/06/2020   HGB 13.9 12/06/2020   HCT 42.4 12/06/2020   MCV 91.2 12/06/2020   PLT 217 12/06/2020   Lab Results  Component Value Date   NA 145 12/06/2020   K 4.5 12/06/2020   CO2 26 12/06/2020   GLUCOSE 161 (H) 12/06/2020   BUN  16 12/06/2020   CREATININE 0.67 12/06/2020   BILITOT 0.9 07/10/2020   ALKPHOS 119 08/25/2016   AST 26 07/10/2020   ALT 38 (H) 07/10/2020   PROT 6.8 07/10/2020   ALBUMIN 4.4 08/25/2016   CALCIUM 9.8 12/06/2020   ANIONGAP 7 12/06/2020   EGFR 90 10/17/2020   Lab Results  Component Value Date   CHOL 192 07/10/2020   Lab Results  Component Value Date   HDL 46 (L) 07/10/2020   Lab Results  Component Value Date   LDLCALC 117 (H) 07/10/2020   Lab Results  Component Value Date   TRIG 171 (H) 07/10/2020   Lab Results  Component Value Date   CHOLHDL 4.2 07/10/2020   Lab Results  Component Value Date   HGBA1C 5.5 10/03/2020       Assessment & Plan:   1. Bilateral lower extremity edema Rechecking metabolic  panel today for baseline Start taking your lasix every day as prescribed Focus on fluid and sodium restrictions Elevate legs and wear compression socks as often as able  Weigh yourself daily and keep a log to bring to next appointment   - BASIC METABOLIC PANEL WITH GFR   Follow-up in 1 week or sooner if needed.   Purcell Nails Olevia Bowens, DNP, FNP-C

## 2021-03-15 NOTE — Patient Instructions (Addendum)
Rechecking metabolic panel today for baseline Start taking your lasix every day as prescribed Focus on fluid and sodium restrictions Elevate legs and wear compression socks as often as able  Weigh yourself daily and keep a log to bring to next appointment

## 2021-03-16 LAB — BASIC METABOLIC PANEL WITH GFR
BUN: 20 mg/dL (ref 7–25)
CO2: 29 mmol/L (ref 20–32)
Calcium: 9.9 mg/dL (ref 8.6–10.4)
Chloride: 106 mmol/L (ref 98–110)
Creat: 0.76 mg/dL (ref 0.60–1.00)
Glucose, Bld: 86 mg/dL (ref 65–99)
Potassium: 4.4 mmol/L (ref 3.5–5.3)
Sodium: 142 mmol/L (ref 135–146)
eGFR: 83 mL/min/{1.73_m2} (ref >=60)

## 2021-03-22 ENCOUNTER — Ambulatory Visit (INDEPENDENT_AMBULATORY_CARE_PROVIDER_SITE_OTHER): Payer: Medicare Other | Admitting: Family Medicine

## 2021-03-22 ENCOUNTER — Other Ambulatory Visit: Payer: Self-pay

## 2021-03-22 ENCOUNTER — Encounter: Payer: Self-pay | Admitting: Family Medicine

## 2021-03-22 VITALS — BP 126/79 | HR 72 | Temp 99.1°F | Ht 62.0 in | Wt 215.0 lb

## 2021-03-22 DIAGNOSIS — R600 Localized edema: Secondary | ICD-10-CM | POA: Diagnosis not present

## 2021-03-22 NOTE — Progress Notes (Signed)
Acute Office Visit  Subjective:    Patient ID: Martha Perkins, female    DOB: Jan 25, 1946, 76 y.o.   MRN: [id number]  Chief Complaint  Patient presents with   Edema    HPI Patient is in today for edema follow-up.  According to home scale she lost a few pounds, but according to ours she gained 2 pounds. Reports she has been weighing first thing in the morning in pajamas. At home on Monday she weighed 213 lbs and on Friday she weighed 209 lbs. She noticed her legs felt a little wet yesterday, but hasn't been noticing as much weeping as last week. Reports she is wearing compression socks and limiting her fluids and sodium. States she has been taking her lasix as prescribed.   She denies any chest pain, dyspnea/shortness of breath, wheezing, coughing, fevers, abdominal distention/pain.    Past Medical History:  Diagnosis Date   Arthritis    neck, back, hips, knees, ankles   Asthma    use everyday   Cancer ([place]) 2019   skin   Dyspnea    Dysrhythmia    PVCs , Afib   Heart murmur    History of kidney stones    Hyperlipidemia    Hypertension    Hyperthyroidism 08/08/2017   radio active iodine x2   Hypothyroidism    now take synthroid   Paroxysmal atrial fibrillation ([place]) 08/09/2015   PONV (postoperative nausea and vomiting)     Past Surgical History:  Procedure Laterality Date   CARDIOVERSION N/A 08/05/2016   Procedure: CARDIOVERSION;  Surgeon: Skeet Latch, MD;  Location: [facility];  Service: Cardiovascular;  Laterality: N/A;   CATARACT EXTRACTION Left 09/2012   Dr. Bunnie Philips   LITHOTRIPSY     REPLACEMENT TOTAL KNEE BILATERAL  2011   REVERSE SHOULDER ARTHROPLASTY Left 07/19/2020   Procedure: REVERSE SHOULDER ARTHROPLASTY;  Surgeon: Justice Britain, MD;  Location: [facility];  Service: Orthopedics;  Laterality: Left;  118mn   REVERSE SHOULDER ARTHROPLASTY Right 12/06/2020   Procedure: REVERSE SHOULDER ARTHROPLASTY;  Surgeon: SJustice Britain MD;  Location: [facility];   Service: Orthopedics;  Laterality: Right;  1257m   THYROIDECTOMY  1971   TUBAL LIGATION  1979    Family History  Problem Relation Age of Onset   Coronary artery disease Mother 6552     deceased   Depression Mother    Hyperlipidemia Mother    Diabetes Paternal Grandmother    Thyroid disease Sister    COPD Father    Breast cancer Neg Hx     Social History   Socioeconomic History   Marital status: Married    Spouse name: StRemo Lipps Number of children: 2   Years of education: 16   Highest education level: Bachelor's degree (e.g., BA, AB, BS)  Occupational History   Occupation: Retired RNTherapist, sports  Tobacco Use   Smoking status: Never   Smokeless tobacco: Never  Vaping Use   Vaping Use: Never used  Substance and Sexual Activity   Alcohol use: No   Drug use: No   Sexual activity: Not Currently  Other Topics Concern   Not on file  Social History Narrative   Stay around the house. Go to grocery store.    Social Determinants of Health   Financial Resource Strain: Not on file  Food Insecurity: Not on file  Transportation Needs: Not on file  Physical Activity: Not on file  Stress: Not on file  Social Connections: Not on  file  Intimate Partner Violence: Not on file    Outpatient Medications Prior to Visit  Medication Sig Dispense Refill   acetaminophen (TYLENOL) 500 MG tablet Take 500 mg by mouth every 6 (six) hours as needed for moderate pain or headache.     albuterol (VENTOLIN HFA) 108 (90 Base) MCG/ACT inhaler INHALE TWO PUFFS INTO THE LUNGS EVERY 6 HOURS AS NEEDED FOR WHEEZING OR SHORTNESS OF BREATH 8.5 g 2   atorvastatin (LIPITOR) 40 MG tablet Take 1 tablet (40 mg total) by mouth at bedtime. 90 tablet 3   benzonatate (TESSALON) 100 MG capsule Take 1 capsule (100 mg total) by mouth 2 (two) times daily as needed for cough. 20 capsule 0   Biotin 1000 MCG tablet Take 1,000 mcg by mouth daily.     Cholecalciferol (VITAMIN D) 125 MCG (5000 UT) CAPS Take 5,000 Units by mouth  daily. With K2     clotrimazole-betamethasone (LOTRISONE) cream Apply 1 application topically 2 (two) times daily. (Patient taking differently: Apply 1 application topically 2 (two) times daily as needed (fungus).) 45 g 2   Coenzyme Q10 (COQ-10) 100 MG CAPS Take 100 mg by mouth daily.     cyclobenzaprine (FLEXERIL) 10 MG tablet Take 1 tablet (10 mg total) by mouth 3 (three) times daily as needed for muscle spasms. 30 tablet 1   cyclobenzaprine (FLEXERIL) 5 MG tablet Take 1 tablet (5 mg total) by mouth at bedtime as needed for muscle spasms. 30 tablet 1   fluticasone (FLONASE) 50 MCG/ACT nasal spray USE 1 SPRAY IN BOTH  NOSTRILS TWICE DAILY 48 g 3   Fluticasone Furoate (ARNUITY ELLIPTA) 100 MCG/ACT AEPB Inhale 1 Inhaler into the lungs daily. 30 each 2   furosemide (LASIX) 20 MG tablet TAKE 3 TABLETS BY MOUTH  DAILY 90 tablet 11   ketoconazole (NIZORAL) 2 % cream Apply topically daily as needed for irritation. (Patient taking differently: Apply 1 application topically daily as needed for irritation.) 60 g 3   levothyroxine (SYNTHROID) 150 MCG tablet TAKE 1 TABLET BY MOUTH  DAILY BEFORE BREAKFAST TAKE EXTRA ONE-HALF TABLET BY  MOUTH 1 DAY WEEK (Patient taking differently: Take 150-225 mcg by mouth daily before breakfast. TAKE 1 TABLET BY MOUTH  DAILY BEFORE BREAKFAST TAKE EXTRA ONE-HALF TABLET BY  MOUTH 1 DAY WEEK 225 mcg on Saturday and 150 mcg all other days) 96 tablet 3   loratadine (CLARITIN) 10 MG tablet Take 10 mg by mouth at bedtime.     Magnesium Citrate 200 MG TABS Take 200 mg by mouth daily.     meloxicam (MOBIC) 15 MG tablet TAKE 1 TABLET BY MOUTH  DAILY 90 tablet 3   metoprolol succinate (TOPROL-XL) 50 MG 24 hr tablet Take 2 tablets (100 mg total) by mouth daily. TAKE 2 TABLETS BY MOUTH  DAILY WITH OR IMMEDIATELY  FOLLOWING A MEAL 90 tablet 3   nirmatrelvir/ritonavir EUA (PAXLOVID) 20 x 150 MG & 10 x 100MG TABS Take 3 tablets by mouth 2 (two) times daily. Use as directed per package insert.   May use any available formulation at pharmacy with the same total dosage. 30 tablet 0   ondansetron (ZOFRAN) 4 MG tablet Take 1 tablet (4 mg total) by mouth every 8 (eight) hours as needed for nausea or vomiting. 10 tablet 0   oxyCODONE-acetaminophen (PERCOCET) 5-325 MG tablet Take 0.5-1 tablets by mouth every 6 (six) hours as needed for severe pain. 10 tablet 0   potassium chloride (KLOR-CON) 10 MEQ tablet TAKE  1 TABLET BY MOUTH  DAILY AS NEEDED (Patient taking differently: Take 10 mEq by mouth daily as needed (Takes with Lasix).) 90 tablet 3   traMADol (ULTRAM) 50 MG tablet TAKE ONE TABLET BY MOUTH THREE TIMES DAILY AS NEEDED 90 tablet 0   TURMERIC PO Take 1,500 mg by mouth daily.     valsartan (DIOVAN) 160 MG tablet Take 160 mg by mouth every evening.     vitamin B-12 (CYANOCOBALAMIN) 1000 MCG tablet Take 1,000 mcg by mouth daily.     vitamin C (ASCORBIC ACID) 500 MG tablet Take 500 mg by mouth 2 (two) times daily.     Zinc 50 MG CAPS Take 50 mg by mouth daily.     No facility-administered medications prior to visit.    Allergies  Allergen Reactions   Meperidine And Related Nausea And Vomiting   Oxycodone     loopy   Percocet [Oxycodone-Acetaminophen]     Feels loopy   Demerol Nausea And Vomiting    Review of Systems All review of systems negative except what is listed in the HPI     Objective:    Physical Exam Vitals reviewed.  Constitutional:      Appearance: Normal appearance. She is obese.  Cardiovascular:     Rate and Rhythm: Normal rate and regular rhythm.     Heart sounds: Normal heart sounds.  Pulmonary:     Effort: Pulmonary effort is normal.     Breath sounds: Normal breath sounds. No rales.  Abdominal:     Palpations: Abdomen is soft.  Musculoskeletal:     Right lower leg: Edema present.     Left lower leg: Edema present.     Comments: 2+  Neurological:     General: No focal deficit present.     Mental Status: She is alert and oriented to person, place,  and time. Mental status is at baseline.  Psychiatric:        Mood and Affect: Mood normal.        Behavior: Behavior normal.        Thought Content: Thought content normal.        Judgment: Judgment normal.    There were no vitals taken for this visit. Wt Readings from Last 3 Encounters:  03/15/21 213 lb 1.9 oz (96.7 kg)  12/06/20 208 lb 1.1 oz (94.4 kg)  12/06/20 208 lb 1.1 oz (94.4 kg)    There are no preventive care reminders to display for this patient.  There are no preventive care reminders to display for this patient.   Lab Results  Component Value Date   TSH 0.46 07/10/2020   Lab Results  Component Value Date   WBC 12.3 (H) 12/06/2020   HGB 13.9 12/06/2020   HCT 42.4 12/06/2020   MCV 91.2 12/06/2020   PLT 217 12/06/2020   Lab Results  Component Value Date   NA 142 03/15/2021   K 4.4 03/15/2021   CO2 29 03/15/2021   GLUCOSE 86 03/15/2021   BUN 20 03/15/2021   CREATININE 0.76 03/15/2021   BILITOT 0.9 07/10/2020   ALKPHOS 119 08/25/2016   AST 26 07/10/2020   ALT 38 (H) 07/10/2020   PROT 6.8 07/10/2020   ALBUMIN 4.4 08/25/2016   CALCIUM 9.9 03/15/2021   ANIONGAP 7 12/06/2020   EGFR 83 03/15/2021   Lab Results  Component Value Date   CHOL 192 07/10/2020   Lab Results  Component Value Date   HDL 46 (L) 07/10/2020  Lab Results  Component Value Date   LDLCALC 117 (H) 07/10/2020   Lab Results  Component Value Date   TRIG 171 (H) 07/10/2020   Lab Results  Component Value Date   CHOLHDL 4.2 07/10/2020   Lab Results  Component Value Date   HGBA1C 5.5 10/03/2020       Assessment & Plan:   1. Bilateral lower extremity edema Take 1 extra lasix tab each day to total 80 mg of lasix for Saturday, Sunday, and Monday this weekend. Then please follow-up here on Tuesday or Wednesday to reassess your weight, swelling, and kidney function.  Try to limit your daily fluid intake to about 50 ounces total.  Continue avoiding salt, wearing your compression  socks and elevating your legs as much as possible.   - BASIC METABOLIC PANEL WITH GFR  Patient aware of signs/symptoms requiring further/urgent evaluation.   Follow-up in 4 days or sooner if needed.   Terrilyn Saver, NP

## 2021-03-22 NOTE — Patient Instructions (Addendum)
Take 1 extra lasix tab each day to total 80 mg of lasix for Saturday, Sunday, and Monday this weekend. Then please follow-up here on Tuesday or Wednesday to reassess your weight, swelling, and kidney function.  Try to limit your fluid intake to about 50 ounces total.  Continue avoiding salt, wearing your compression socks and elevating your legs as much as possible.

## 2021-03-23 ENCOUNTER — Encounter: Payer: Self-pay | Admitting: Family Medicine

## 2021-03-23 LAB — BASIC METABOLIC PANEL WITH GFR
BUN: 21 mg/dL (ref 7–25)
CO2: 27 mmol/L (ref 20–32)
Calcium: 10 mg/dL (ref 8.6–10.4)
Chloride: 103 mmol/L (ref 98–110)
Creat: 0.79 mg/dL (ref 0.60–1.00)
Glucose, Bld: 98 mg/dL (ref 65–99)
Potassium: 5.1 mmol/L (ref 3.5–5.3)
Sodium: 141 mmol/L (ref 135–146)
eGFR: 79 mL/min/{1.73_m2} (ref >=60)

## 2021-03-26 ENCOUNTER — Ambulatory Visit (INDEPENDENT_AMBULATORY_CARE_PROVIDER_SITE_OTHER): Payer: Medicare Other | Admitting: Family Medicine

## 2021-03-26 ENCOUNTER — Other Ambulatory Visit: Payer: Self-pay

## 2021-03-26 ENCOUNTER — Encounter: Payer: Self-pay | Admitting: Family Medicine

## 2021-03-26 ENCOUNTER — Telehealth: Payer: Self-pay | Admitting: Family Medicine

## 2021-03-26 ENCOUNTER — Ambulatory Visit (INDEPENDENT_AMBULATORY_CARE_PROVIDER_SITE_OTHER): Payer: Medicare Other

## 2021-03-26 VITALS — BP 139/71 | HR 68 | Resp 18 | Ht 62.0 in | Wt 214.0 lb

## 2021-03-26 DIAGNOSIS — I878 Other specified disorders of veins: Secondary | ICD-10-CM

## 2021-03-26 DIAGNOSIS — R600 Localized edema: Secondary | ICD-10-CM

## 2021-03-26 DIAGNOSIS — M7989 Other specified soft tissue disorders: Secondary | ICD-10-CM | POA: Diagnosis not present

## 2021-03-26 DIAGNOSIS — M79605 Pain in left leg: Secondary | ICD-10-CM | POA: Diagnosis not present

## 2021-03-26 DIAGNOSIS — I10 Essential (primary) hypertension: Secondary | ICD-10-CM

## 2021-03-26 MED ORDER — FUROSEMIDE 10 MG/ML IJ SOLN
20.0000 mg | Freq: Once | INTRAMUSCULAR | Status: AC
  Administered 2021-03-26: 20 mg via INTRAMUSCULAR

## 2021-03-26 MED ORDER — VALSARTAN 320 MG PO TABS: 320.0000 mg | ORAL_TABLET | Freq: Every day | ORAL | 1 refills | Status: DC

## 2021-03-26 NOTE — Assessment & Plan Note (Signed)
Blood pressure still little borderline and certainly could be contributing to the increase swelling.  Would like to try increasing the Diovan to 320 mg to see if we can get the systolic less than 471 and the diastolic less than 80.

## 2021-03-26 NOTE — Telephone Encounter (Signed)
Is call patient and let her know I am also can increase the dose on her Diovan just so that we can get her blood pressure under little bit better control.  Having the blood pressure running a little high can increase the risk for swelling and make it more difficult to control her swelling.

## 2021-03-26 NOTE — Telephone Encounter (Signed)
Unable to dial out at the moment. Will try again later.

## 2021-03-26 NOTE — Progress Notes (Signed)
Established Patient Office Visit  Subjective:  Patient ID: Martha Perkins, female    DOB: 04/23/1945  Age: 76 y.o. MRN: [id number]  CC:  Chief Complaint  Patient presents with   Follow-up    Swelling in legs and feet. Patient stated she has noticed some drainage/fluid from legs. Patient states legs are painful off and on. Slight improvement with swelling in legs     HPI Martha Perkins presents for Swelling in legs and feet that started about 3 weeks ago to the point that she was seeping and having fluid in her shoes from her legs.. Patient stated she has noticed some drainage/fluid from legs. Patient states legs are painful off and on. Slight improvement with swelling in legs.  But she still feels like there is still some swollen.  She says the left leg has been more painful and more bothersome and in fact was really uncomfortable last night while she was trying to fall asleep.  She denies any change in breathing or chest symptoms.  No chest pain.  Been trying to limit her fluid intake to no more than 50 ounces a day.  And she is now taking 80 mg of Lasix.  Weight was 208 lb in Sept.  She is now 214 pounds, but only down 1 pound since she was here last week.  Past Medical History:  Diagnosis Date   Arthritis    neck, back, hips, knees, ankles   Asthma    use everyday   Cancer ([place]) 2019   skin   Dyspnea    Dysrhythmia    PVCs , Afib   Heart murmur    History of kidney stones    Hyperlipidemia    Hypertension    Hyperthyroidism 08/08/2017   radio active iodine x2   Hypothyroidism    now take synthroid   Paroxysmal atrial fibrillation ([place]) 08/09/2015   PONV (postoperative nausea and vomiting)     Past Surgical History:  Procedure Laterality Date   CARDIOVERSION N/A 08/05/2016   Procedure: CARDIOVERSION;  Surgeon: Skeet Latch, MD;  Location: [facility];  Service: Cardiovascular;  Laterality: N/A;   CATARACT EXTRACTION Left 09/2012   Dr. Bunnie Philips   LITHOTRIPSY      REPLACEMENT TOTAL KNEE BILATERAL  2011   REVERSE SHOULDER ARTHROPLASTY Left 07/19/2020   Procedure: REVERSE SHOULDER ARTHROPLASTY;  Surgeon: Justice Britain, MD;  Location: [facility];  Service: Orthopedics;  Laterality: Left;  152mn   REVERSE SHOULDER ARTHROPLASTY Right 12/06/2020   Procedure: REVERSE SHOULDER ARTHROPLASTY;  Surgeon: SJustice Britain MD;  Location: [facility];  Service: Orthopedics;  Laterality: Right;  1213m   THYROIDECTOMY  1971   TUBAL LIGATION  1979    Family History  Problem Relation Age of Onset   Coronary artery disease Mother [id number]     deceased   Depression Mother    Hyperlipidemia Mother    Diabetes Paternal Grandmother    Thyroid disease Sister    COPD Father    Breast cancer Neg Hx     Social History   Socioeconomic History   Marital status: Married    Spouse name: StRemo Lipps Number of children: 2   Years of education: 16   Highest education level: Bachelor's degree (e.g., BA, AB, BS)  Occupational History   Occupation: Retired RNTherapist, sports  Tobacco Use   Smoking status: Never   Smokeless tobacco: Never  Vaping Use   Vaping Use: Never used  Substance and Sexual Activity  Alcohol use: No   Drug use: No   Sexual activity: Not Currently  Other Topics Concern   Not on file  Social History Narrative   Stay around the house. Go to grocery store.    Social Determinants of Health   Financial Resource Strain: Not on file  Food Insecurity: Not on file  Transportation Needs: Not on file  Physical Activity: Not on file  Stress: Not on file  Social Connections: Not on file  Intimate Partner Violence: Not on file    Outpatient Medications Prior to Visit  Medication Sig Dispense Refill   acetaminophen (TYLENOL) 500 MG tablet Take 500 mg by mouth every 6 (six) hours as needed for moderate pain or headache.     albuterol (VENTOLIN HFA) 108 (90 Base) MCG/ACT inhaler INHALE TWO PUFFS INTO THE LUNGS EVERY 6 HOURS AS NEEDED FOR WHEEZING OR SHORTNESS OF BREATH 8.5 g 2    atorvastatin (LIPITOR) 40 MG tablet Take 1 tablet (40 mg total) by mouth at bedtime. 90 tablet 3   Biotin 1000 MCG tablet Take 1,000 mcg by mouth daily.     Cholecalciferol (VITAMIN D) 125 MCG (5000 UT) CAPS Take 5,000 Units by mouth daily. With K2     clotrimazole-betamethasone (LOTRISONE) cream Apply 1 application topically 2 (two) times daily. (Patient taking differently: Apply 1 application topically 2 (two) times daily as needed (fungus).) 45 g 2   Coenzyme Q10 (COQ-10) 100 MG CAPS Take 100 mg by mouth daily.     fluticasone (FLONASE) 50 MCG/ACT nasal spray USE 1 SPRAY IN BOTH  NOSTRILS TWICE DAILY 48 g 3   Fluticasone Furoate (ARNUITY ELLIPTA) 100 MCG/ACT AEPB Inhale 1 Inhaler into the lungs daily. 30 each 2   furosemide (LASIX) 20 MG tablet TAKE 3 TABLETS BY MOUTH  DAILY (Patient taking differently: 20 mg daily. Patient takes 4 tablets ( 80 mg ) daily.) 90 tablet 11   ketoconazole (NIZORAL) 2 % cream Apply topically daily as needed for irritation. (Patient taking differently: Apply 1 application topically daily as needed for irritation.) 60 g 3   levothyroxine (SYNTHROID) 150 MCG tablet TAKE 1 TABLET BY MOUTH  DAILY BEFORE BREAKFAST TAKE EXTRA ONE-HALF TABLET BY  MOUTH 1 DAY WEEK (Patient taking differently: Take 150-225 mcg by mouth daily before breakfast. TAKE 1 TABLET BY MOUTH  DAILY BEFORE BREAKFAST TAKE EXTRA ONE-HALF TABLET BY  MOUTH 1 DAY WEEK 225 mcg on Saturday and 150 mcg all other days) 96 tablet 3   loratadine (CLARITIN) 10 MG tablet Take 10 mg by mouth at bedtime.     Magnesium Citrate 200 MG TABS Take 200 mg by mouth daily.     meloxicam (MOBIC) 15 MG tablet TAKE 1 TABLET BY MOUTH  DAILY 90 tablet 3   metoprolol succinate (TOPROL-XL) 50 MG 24 hr tablet Take 2 tablets (100 mg total) by mouth daily. TAKE 2 TABLETS BY MOUTH  DAILY WITH OR IMMEDIATELY  FOLLOWING A MEAL 90 tablet 3   potassium chloride (KLOR-CON) 10 MEQ tablet TAKE 1 TABLET BY MOUTH  DAILY AS NEEDED (Patient taking  differently: Take 10 mEq by mouth daily as needed (Takes with Lasix).) 90 tablet 3   traMADol (ULTRAM) 50 MG tablet TAKE ONE TABLET BY MOUTH THREE TIMES DAILY AS NEEDED 90 tablet 0   TURMERIC PO Take 1,500 mg by mouth daily.     vitamin B-12 (CYANOCOBALAMIN) 1000 MCG tablet Take 1,000 mcg by mouth daily.     vitamin C (ASCORBIC ACID) 500  MG tablet Take 500 mg by mouth 2 (two) times daily.     Zinc 50 MG CAPS Take 50 mg by mouth daily.     valsartan (DIOVAN) 160 MG tablet Take 160 mg by mouth every evening.     benzonatate (TESSALON) 100 MG capsule Take 1 capsule (100 mg total) by mouth 2 (two) times daily as needed for cough. 20 capsule 0   cyclobenzaprine (FLEXERIL) 10 MG tablet Take 1 tablet (10 mg total) by mouth 3 (three) times daily as needed for muscle spasms. 30 tablet 1   cyclobenzaprine (FLEXERIL) 5 MG tablet Take 1 tablet (5 mg total) by mouth at bedtime as needed for muscle spasms. 30 tablet 1   nirmatrelvir/ritonavir EUA (PAXLOVID) 20 x 150 MG & 10 x 100MG TABS Take 3 tablets by mouth 2 (two) times daily. Use as directed per package insert.  May use any available formulation at pharmacy with the same total dosage. 30 tablet 0   ondansetron (ZOFRAN) 4 MG tablet Take 1 tablet (4 mg total) by mouth every 8 (eight) hours as needed for nausea or vomiting. 10 tablet 0   oxyCODONE-acetaminophen (PERCOCET) 5-325 MG tablet Take 0.5-1 tablets by mouth every 6 (six) hours as needed for severe pain. 10 tablet 0   No facility-administered medications prior to visit.    Allergies  Allergen Reactions   Meperidine And Related Nausea And Vomiting   Oxycodone     loopy   Percocet [Oxycodone-Acetaminophen]     Feels loopy   Demerol Nausea And Vomiting    ROS Review of Systems    Objective:    Physical Exam Constitutional:      Appearance: Normal appearance. She is well-developed.  HENT:     Head: Normocephalic and atraumatic.  Cardiovascular:     Rate and Rhythm: Normal rate and  regular rhythm.     Heart sounds: Normal heart sounds.  Pulmonary:     Effort: Pulmonary effort is normal.     Breath sounds: Normal breath sounds.  Musculoskeletal:     Comments: Trace pitting edema both lower extremities with some peau d'orange skin with mild erythema anterior bilaterally.  Skin:    General: Skin is warm and dry.  Neurological:     Mental Status: She is alert and oriented to person, place, and time.  Psychiatric:        Behavior: Behavior normal.    BP 139/71    Pulse 68    Resp 18    Ht 5' 2"  (1.575 m)    Wt 214 lb (97.1 kg)    SpO2 98%    BMI 39.14 kg/m  Wt Readings from Last 3 Encounters:  03/26/21 214 lb (97.1 kg)  03/22/21 215 lb 0.6 oz (97.5 kg)  03/15/21 213 lb 1.9 oz (96.7 kg)     There are no preventive care reminders to display for this patient.  There are no preventive care reminders to display for this patient.  Lab Results  Component Value Date   TSH 0.46 07/10/2020   Lab Results  Component Value Date   WBC 12.3 (H) 12/06/2020   HGB 13.9 12/06/2020   HCT 42.4 12/06/2020   MCV 91.2 12/06/2020   PLT 217 12/06/2020   Lab Results  Component Value Date   NA 141 03/22/2021   K 5.1 03/22/2021   CO2 27 03/22/2021   GLUCOSE 98 03/22/2021   BUN 21 03/22/2021   CREATININE 0.79 03/22/2021   BILITOT 0.9 07/10/2020  ALKPHOS 119 08/25/2016   AST 26 07/10/2020   ALT 38 (H) 07/10/2020   PROT 6.8 07/10/2020   ALBUMIN 4.4 08/25/2016   CALCIUM 10.0 03/22/2021   ANIONGAP 7 12/06/2020   EGFR 79 03/22/2021   Lab Results  Component Value Date   CHOL 192 07/10/2020   Lab Results  Component Value Date   HDL 46 (L) 07/10/2020   Lab Results  Component Value Date   LDLCALC 117 (H) 07/10/2020   Lab Results  Component Value Date   TRIG 171 (H) 07/10/2020   Lab Results  Component Value Date   CHOLHDL 4.2 07/10/2020   Lab Results  Component Value Date   HGBA1C 5.5 10/03/2020      Assessment & Plan:   Problem List Items Addressed  This Visit       Cardiovascular and Mediastinum   Essential hypertension    Blood pressure still little borderline and certainly could be contributing to the increase swelling.  Would like to try increasing the Diovan to 320 mg to see if we can get the systolic less than 248 and the diastolic less than 80.      Relevant Medications   valsartan (DIOVAN) 320 MG tablet     Other   Venous stasis    Continue 80 mg Lasix daily for at least the next several days continue to weigh at home daily.  Increase potassium to 3 tabs daily to avoid hypokalemia.  Limit fluids to no more than 50 ounces daily.  Would also like to work to get her blood pressure little bit lower to see if that may help control the swelling as well.      Relevant Orders   US Venous Img Lower Unilateral Left (Completed)   Other Visit Diagnoses     Left leg pain    -  Primary   Relevant Orders   US Venous Img Lower Unilateral Left (Completed)   Bilateral lower extremity edema       Relevant Medications   furosemide (LASIX) injection 20 mg (Completed)   furosemide (LASIX) injection 20 mg (Completed)   Other Relevant Orders   US Venous Img Lower Unilateral Left (Completed)       Venous stasis-discussed doing the Lasix IM to try to bypass the gut and see if that helps with a little bit of diuresis of still feel like she is about 5 pounds up from her baseline.  Continue to fluid restrict to no more than 50 ounces per day.  Recommend that she try to wear her compression stocking as much as she can.  Left leg pain-did recommend further work-up just to rule out DVT.    Meds ordered this encounter  Medications   furosemide (LASIX) injection 20 mg   furosemide (LASIX) injection 20 mg   valsartan (DIOVAN) 320 MG tablet    Sig: Take 1 tablet (320 mg total) by mouth daily.    Dispense:  90 tablet    Refill:  1    Follow-up: No follow-ups on file.    Beatrice Lecher, MD

## 2021-03-26 NOTE — Progress Notes (Signed)
No sign of blood clot which is great.  Please see phone note.  Continue with 80 mg of Lasix daily increase potassium to 3 tabs daily.  I am also increasing the Diovan to bring her blood pressure down.  Limit fluids to no more than 50 ounces a day.  Strict salt avoidance.  Call back on Friday to let me know her home weight today versus her home weight on Friday.

## 2021-03-26 NOTE — Telephone Encounter (Signed)
Spoke with pt and advised of recommendations.  Pt expressed understanding and is agreeable.  Charyl Bigger, CMA

## 2021-03-26 NOTE — Assessment & Plan Note (Signed)
Continue 80 mg Lasix daily for at least the next several days continue to weigh at home daily.  Increase potassium to 3 tabs daily to avoid hypokalemia.  Limit fluids to no more than 50 ounces daily.  Would also like to work to get her blood pressure little bit lower to see if that may help control the swelling as well.

## 2021-03-28 ENCOUNTER — Other Ambulatory Visit: Payer: Self-pay | Admitting: Family Medicine

## 2021-03-28 DIAGNOSIS — M5136 Other intervertebral disc degeneration, lumbar region: Secondary | ICD-10-CM

## 2021-03-28 DIAGNOSIS — M51369 Other intervertebral disc degeneration, lumbar region without mention of lumbar back pain or lower extremity pain: Secondary | ICD-10-CM

## 2021-03-28 DIAGNOSIS — G8929 Other chronic pain: Secondary | ICD-10-CM

## 2021-03-28 NOTE — Telephone Encounter (Signed)
Per [facility] - "Maximum MME cannot be calculated for this prescription. Enter discrete sig details to calculate maximum MME".

## 2021-03-29 ENCOUNTER — Telehealth: Payer: Self-pay

## 2021-03-29 DIAGNOSIS — R600 Localized edema: Secondary | ICD-10-CM

## 2021-03-29 NOTE — Telephone Encounter (Signed)
Martha Perkins called and left a message stating she was to call about her weight. She is wanting to know if she should continue Lasix 80 mg or go back to the 60 mg.   The swelling is a little better.   Tuesday 210 lbs Wednesday 209 lbs Thursday 210 lbs Friday 210 lbs

## 2021-03-29 NOTE — Telephone Encounter (Signed)
Okay, just let her know that it is okay to alternate between 62 and 80.  So 1 day take 80 the next day take 60 that we will just make a slight change backwards and make sure that she does not start gaining fluid again.  Continue to limit total fluids for the day to no more than 50 ounces and continue to really limit salt intake.  I also like to consider referring her for evaluation for something called lymphedema that is where the legs stay chronically swollen and do not always respond great to the diuretic I really feel like she may have a component of venous stasis and lymphedema.  If she is okay with a referral let me know and I will be happy to make that

## 2021-04-01 NOTE — Telephone Encounter (Signed)
Orders Placed This Encounter  Procedures  . Ambulatory referral to Physical Therapy    Referral Priority:   Routine    Referral Type:   Physical Medicine    Referral Reason:   Specialty Services Required    Requested Specialty:   Physical Therapy    Number of Visits Requested:   1    

## 2021-04-01 NOTE — Telephone Encounter (Signed)
Patient advised of recommendations. She is ok with referral.

## 2021-04-05 ENCOUNTER — Ambulatory Visit (INDEPENDENT_AMBULATORY_CARE_PROVIDER_SITE_OTHER): Payer: Medicare Other | Admitting: Family Medicine

## 2021-04-05 ENCOUNTER — Encounter: Payer: Self-pay | Admitting: Family Medicine

## 2021-04-05 ENCOUNTER — Other Ambulatory Visit: Payer: Self-pay

## 2021-04-05 VITALS — BP 143/62 | HR 92 | Resp 16 | Ht 62.0 in | Wt 217.0 lb

## 2021-04-05 DIAGNOSIS — I10 Essential (primary) hypertension: Secondary | ICD-10-CM

## 2021-04-05 DIAGNOSIS — M7989 Other specified soft tissue disorders: Secondary | ICD-10-CM

## 2021-04-05 DIAGNOSIS — R600 Localized edema: Secondary | ICD-10-CM

## 2021-04-05 MED ORDER — SPIRONOLACTONE 25 MG PO TABS: 25.0000 mg | ORAL_TABLET | Freq: Every day | ORAL | 3 refills | Status: DC

## 2021-04-05 MED ORDER — FUROSEMIDE 40 MG PO TABS: 80.0000 mg | ORAL_TABLET | Freq: Every day | ORAL | 1 refills | Status: DC

## 2021-04-05 NOTE — Assessment & Plan Note (Signed)
Not well controlled.  Continue Diovan 3 to 20 mg, metoprolol 50 mg daily.  We will add spironolactone 25 mg and increase the Lasix back to 80 mg twice a day.  Follow-up in 2 weeks for blood pressure check and check on

## 2021-04-05 NOTE — Progress Notes (Signed)
Acute Office Visit  Subjective:    Patient ID: Martha Perkins, female    DOB: 03-Feb-1946, 76 y.o.   MRN: [id number]  Chief Complaint  Patient presents with   Leg Pain    Right upper leg pain, Bilateral leg tightness, swollen leg, red spot on lower right leg.     HPI Patient is in today for right leg pain and swelling.  I recently saw her after she had been swelling for several weeks and then been taking some extra diuretic to try to reduce that swelling.  Blood pressure was a little borderline and so we had tried to increase her Diovan to 320 mg.  She was concerned because when she woke up this morning she saw little red area on her right shin this morning.  She has been taking 60 mg alternating with 80 mg of Lasix every other day.  Having right upper inner thigh pain.  Also having swelling.  She does not member any injury or trauma she has been pretty sedentary lately and so wonders if it might just be from not using it well.  Was able to get an appoint with lymphedema clinic but its not until the end of March.  He did go up on the Diovan to 320 mg but says she felt "lousy" for a couple of days and so she actually decided to hold her nighttime dose of metoprolol and she says feel better.  Past Medical History:  Diagnosis Date   Arthritis    neck, back, hips, knees, ankles   Asthma    use everyday   Cancer ([place]) 2019   skin   Dyspnea    Dysrhythmia    PVCs , Afib   Heart murmur    History of kidney stones    Hyperlipidemia    Hypertension    Hyperthyroidism 08/08/2017   radio active iodine x2   Hypothyroidism    now take synthroid   Paroxysmal atrial fibrillation ([place]) 08/09/2015   PONV (postoperative nausea and vomiting)     Past Surgical History:  Procedure Laterality Date   CARDIOVERSION N/A 08/05/2016   Procedure: CARDIOVERSION;  Surgeon: Skeet Latch, MD;  Location: [place];  Service: Cardiovascular;  Laterality: N/A;   CATARACT EXTRACTION Left 09/2012    Dr. Bunnie Philips   LITHOTRIPSY     REPLACEMENT TOTAL KNEE BILATERAL  2011   REVERSE SHOULDER ARTHROPLASTY Left 07/19/2020   Procedure: REVERSE SHOULDER ARTHROPLASTY;  Surgeon: Justice Britain, MD;  Location: [facility];  Service: Orthopedics;  Laterality: Left;  123mn   REVERSE SHOULDER ARTHROPLASTY Right 12/06/2020   Procedure: REVERSE SHOULDER ARTHROPLASTY;  Surgeon: SJustice Britain MD;  Location: [facility];  Service: Orthopedics;  Laterality: Right;  1211m   THYROIDECTOMY  1971   TUBAL LIGATION  1979    Family History  Problem Relation Age of Onset   Coronary artery disease Mother [id number]     deceased   Depression Mother    Hyperlipidemia Mother    Diabetes Paternal Grandmother    Thyroid disease Sister    COPD Father    Breast cancer Neg Hx     Social History   Socioeconomic History   Marital status: Married    Spouse name: StRemo Lipps Number of children: 2   Years of education: 16   Highest education level: Bachelor's degree (e.g., BA, AB, BS)  Occupational History   Occupation: Retired RNTherapist, sports  Tobacco Use   Smoking status: Never  Smokeless tobacco: Never  Vaping Use   Vaping Use: Never used  Substance and Sexual Activity   Alcohol use: No   Drug use: No   Sexual activity: Not Currently  Other Topics Concern   Not on file  Social History Narrative   Stay around the house. Go to grocery store.    Social Determinants of Health   Financial Resource Strain: Not on file  Food Insecurity: Not on file  Transportation Needs: Not on file  Physical Activity: Not on file  Stress: Not on file  Social Connections: Not on file  Intimate Partner Violence: Not on file    Outpatient Medications Prior to Visit  Medication Sig Dispense Refill   acetaminophen (TYLENOL) 500 MG tablet Take 500 mg by mouth every 6 (six) hours as needed for moderate pain or headache.     albuterol (VENTOLIN HFA) 108 (90 Base) MCG/ACT inhaler INHALE TWO PUFFS INTO THE LUNGS EVERY 6 HOURS AS NEEDED FOR  WHEEZING OR SHORTNESS OF BREATH 8.5 g 2   atorvastatin (LIPITOR) 40 MG tablet Take 1 tablet (40 mg total) by mouth at bedtime. 90 tablet 3   Biotin 1000 MCG tablet Take 1,000 mcg by mouth daily.     Cholecalciferol (VITAMIN D) 125 MCG (5000 UT) CAPS Take 5,000 Units by mouth daily. With K2     clotrimazole-betamethasone (LOTRISONE) cream Apply 1 application topically 2 (two) times daily. (Patient taking differently: Apply 1 application topically 2 (two) times daily as needed (fungus).) 45 g 2   Coenzyme Q10 (COQ-10) 100 MG CAPS Take 100 mg by mouth daily.     fluticasone (FLONASE) 50 MCG/ACT nasal spray USE 1 SPRAY IN BOTH  NOSTRILS TWICE DAILY 48 g 3   Fluticasone Furoate (ARNUITY ELLIPTA) 100 MCG/ACT AEPB Inhale 1 Inhaler into the lungs daily. 30 each 2   ketoconazole (NIZORAL) 2 % cream Apply topically daily as needed for irritation. (Patient taking differently: Apply 1 application topically daily as needed for irritation.) 60 g 3   levothyroxine (SYNTHROID) 150 MCG tablet TAKE 1 TABLET BY MOUTH  DAILY BEFORE BREAKFAST TAKE EXTRA ONE-HALF TABLET BY  MOUTH 1 DAY WEEK (Patient taking differently: Take 150-225 mcg by mouth daily before breakfast. TAKE 1 TABLET BY MOUTH  DAILY BEFORE BREAKFAST TAKE EXTRA ONE-HALF TABLET BY  MOUTH 1 DAY WEEK 225 mcg on Saturday and 150 mcg all other days) 96 tablet 3   loratadine (CLARITIN) 10 MG tablet Take 10 mg by mouth at bedtime.     Magnesium Citrate 200 MG TABS Take 200 mg by mouth daily.     meloxicam (MOBIC) 15 MG tablet TAKE 1 TABLET BY MOUTH  DAILY 90 tablet 3   metoprolol succinate (TOPROL-XL) 50 MG 24 hr tablet Take 2 tablets (100 mg total) by mouth daily. TAKE 2 TABLETS BY MOUTH  DAILY WITH OR IMMEDIATELY  FOLLOWING A MEAL (Patient taking differently: Take 100 mg by mouth daily. Patient currently taking 1 tablet daily) 90 tablet 3   potassium chloride (KLOR-CON) 10 MEQ tablet TAKE 1 TABLET BY MOUTH  DAILY AS NEEDED (Patient taking differently: Take 10 mEq  by mouth daily as needed (Takes with Lasix).) 90 tablet 3   traMADol (ULTRAM) 50 MG tablet Take 1 tablet (50 mg total) by mouth 3 (three) times daily as needed. 90 tablet 0   TURMERIC PO Take 1,500 mg by mouth daily.     valsartan (DIOVAN) 320 MG tablet Take 1 tablet (320 mg total) by mouth daily. [place]  tablet 1   vitamin B-12 (CYANOCOBALAMIN) 1000 MCG tablet Take 1,000 mcg by mouth daily.     vitamin C (ASCORBIC ACID) 500 MG tablet Take 500 mg by mouth 2 (two) times daily.     Zinc 50 MG CAPS Take 50 mg by mouth daily.     furosemide (LASIX) 20 MG tablet TAKE 3 TABLETS BY MOUTH  DAILY (Patient taking differently: 20 mg daily. Patient takes 4 tablets ( 80 mg ) daily.) 90 tablet 11   No facility-administered medications prior to visit.    Allergies  Allergen Reactions   Meperidine And Related Nausea And Vomiting   Oxycodone     loopy   Percocet [Oxycodone-Acetaminophen]     Feels loopy   Demerol Nausea And Vomiting    Review of Systems     Objective:    Physical Exam Vitals reviewed.  Constitutional:      Appearance: She is well-developed.  HENT:     Head: Normocephalic and atraumatic.  Eyes:     Conjunctiva/sclera: Conjunctivae normal.  Cardiovascular:     Rate and Rhythm: Normal rate.  Pulmonary:     Effort: Pulmonary effort is normal.  Musculoskeletal:     Comments: She has some trace pitting edema on both legs though her left actually looks a little bit worse than her right as far as thickening of the skin over the shin with a little bubbling up of the skin.  On the right though she does have a mildly erythematous area that is more circular.  She also has very dry skin on her legs.  No open wounds or drainage.  Skin:    General: Skin is dry.     Coloration: Skin is not pale.  Neurological:     Mental Status: She is alert and oriented to person, place, and time.  Psychiatric:        Behavior: Behavior normal.    BP (!) 143/62 (BP Location: Left Arm)    Pulse 92     Resp 16    Ht 5' 2"  (1.575 m)    Wt 217 lb (98.4 kg)    SpO2 97%    BMI 39.69 kg/m  Wt Readings from Last 3 Encounters:  04/05/21 217 lb (98.4 kg)  03/26/21 214 lb (97.1 kg)  03/22/21 215 lb 0.6 oz (97.5 kg)    There are no preventive care reminders to display for this patient.   There are no preventive care reminders to display for this patient.   Lab Results  Component Value Date   TSH 0.46 07/10/2020   Lab Results  Component Value Date   WBC 12.3 (H) 12/06/2020   HGB 13.9 12/06/2020   HCT 42.4 12/06/2020   MCV 91.2 12/06/2020   PLT 217 12/06/2020   Lab Results  Component Value Date   NA 141 03/22/2021   K 5.1 03/22/2021   CO2 27 03/22/2021   GLUCOSE 98 03/22/2021   BUN 21 03/22/2021   CREATININE 0.79 03/22/2021   BILITOT 0.9 07/10/2020   ALKPHOS 119 08/25/2016   AST 26 07/10/2020   ALT 38 (H) 07/10/2020   PROT 6.8 07/10/2020   ALBUMIN 4.4 08/25/2016   CALCIUM 10.0 03/22/2021   ANIONGAP 7 12/06/2020   EGFR 79 03/22/2021   Lab Results  Component Value Date   CHOL 192 07/10/2020   Lab Results  Component Value Date   HDL 46 (L) 07/10/2020   Lab Results  Component Value Date   LDLCALC 117 (H)  07/10/2020   Lab Results  Component Value Date   TRIG 171 (H) 07/10/2020   Lab Results  Component Value Date   CHOLHDL 4.2 07/10/2020   Lab Results  Component Value Date   HGBA1C 5.5 10/03/2020       Assessment & Plan:   Problem List Items Addressed This Visit       Cardiovascular and Mediastinum   Essential hypertension    Not well controlled.  Continue Diovan 3 to 20 mg, metoprolol 50 mg daily.  We will add spironolactone 25 mg and increase the Lasix back to 80 mg twice a day.  Follow-up in 2 weeks for blood pressure check and check on      Relevant Medications   furosemide (LASIX) 40 MG tablet   spironolactone (ALDACTONE) 25 MG tablet   Other Visit Diagnoses     Bilateral lower extremity edema    -  Primary   Relevant Orders   TSH    Localized swelling of both lower extremities       Relevant Medications   furosemide (LASIX) 40 MG tablet      Bilateral lower extremity swelling-but she does have a little area of mild erythema on the right anterior shin which I really feel like it is from the swelling.  We discussed putting an Ace wrap on it today she said her husband could probably help her rewrap it this weekend she is just not able to do it herself.  We will increase her Lasix to 80 mg daily, limit fluids to 50 ounces daily.  I still feel like a large component of her chronic swelling is lymphedema but unfortunately she is not able to get an appointment for most 2 months.  They do have her on a cancellation list so hopefully she will get in a little sooner.  I had like to have her come back in about 2 weeks so we can make sure that her blood pressure is also responding.  Meds ordered this encounter  Medications   furosemide (LASIX) 40 MG tablet    Sig: Take 2 tablets (80 mg total) by mouth daily.    Dispense:  180 tablet    Refill:  1    Requesting 1 year supply   spironolactone (ALDACTONE) 25 MG tablet    Sig: Take 1 tablet (25 mg total) by mouth daily.    Dispense:  90 tablet    Refill:  3     Beatrice Lecher, MD

## 2021-04-06 LAB — TSH: TSH: 0.3 mIU/L — ABNORMAL LOW (ref 0.40–4.50)

## 2021-04-08 ENCOUNTER — Ambulatory Visit: Payer: Medicare Other | Admitting: Family Medicine

## 2021-04-08 ENCOUNTER — Encounter: Payer: Self-pay | Admitting: Family Medicine

## 2021-04-08 NOTE — Progress Notes (Signed)
Hi Martha Perkins, your TSH is still a little overly suppressed so I need to make an adjustment to your thyroid regimen again.  Please verify your dose and we will make a slight change and then plan to recheck it again in 6 weeks.

## 2021-04-10 NOTE — Progress Notes (Signed)
OK, take half a tab on Sundays and whole tab the other 6 days of week and then repeat pattern weekly

## 2021-04-15 ENCOUNTER — Other Ambulatory Visit: Payer: Self-pay

## 2021-04-15 ENCOUNTER — Encounter: Payer: Self-pay | Admitting: Family Medicine

## 2021-04-15 ENCOUNTER — Ambulatory Visit (INDEPENDENT_AMBULATORY_CARE_PROVIDER_SITE_OTHER): Payer: Medicare Other | Admitting: Family Medicine

## 2021-04-15 VITALS — BP 129/80 | HR 80 | Resp 16 | Ht 62.0 in | Wt 213.0 lb

## 2021-04-15 DIAGNOSIS — R7301 Impaired fasting glucose: Secondary | ICD-10-CM | POA: Diagnosis not present

## 2021-04-15 DIAGNOSIS — I10 Essential (primary) hypertension: Secondary | ICD-10-CM

## 2021-04-15 DIAGNOSIS — E8881 Metabolic syndrome: Secondary | ICD-10-CM

## 2021-04-15 DIAGNOSIS — E890 Postprocedural hypothyroidism: Secondary | ICD-10-CM | POA: Diagnosis not present

## 2021-04-15 LAB — POCT GLYCOSYLATED HEMOGLOBIN (HGB A1C): Hemoglobin A1C: 5.8 % — AB (ref 4.0–5.6)

## 2021-04-15 MED ORDER — POTASSIUM CHLORIDE ER 10 MEQ PO TBCR: 20.0000 meq | EXTENDED_RELEASE_TABLET | Freq: Every day | ORAL | 0 refills | Status: DC | PRN

## 2021-04-15 MED ORDER — LEVOTHYROXINE SODIUM 150 MCG PO TABS: 150.0000 ug | ORAL_TABLET | Freq: Every day | ORAL | 0 refills | Status: DC

## 2021-04-15 MED ORDER — METOPROLOL SUCCINATE ER 50 MG PO TB24: 50.0000 mg | ORAL_TABLET | Freq: Every day | ORAL | 0 refills | Status: DC

## 2021-04-15 NOTE — Assessment & Plan Note (Signed)
Dec dose to whole tab daily.  Drop off the extra half a tab weekly.  Plan to recheck in about 8 weeks.

## 2021-04-15 NOTE — Progress Notes (Signed)
Established Patient Office Visit  Subjective:  Patient ID: Martha Perkins, female    DOB: Jul 31, 1945  Age: 76 y.o. MRN: [id number]  CC:  Chief Complaint  Patient presents with   Hypertension    Follow up    Leg Swelling    Follow up. Some improvement    Imapired fasting glucose     Follow up     HPI Martha Perkins presents for   Hypertension-a few changes to the medication she is only taking metoprolol once.  She did not feel great when she was taking it twice a day so she dropped her evening dose.  And she has started the spironolactone so far tolerating it well.  She was not sure why what to do with the potassium she has been taking anywhere between 2 to 3/day.  Impaired fasting glucose-no increased thirst or urination. No symptoms consistent with hypoglycemia.  Lower extremity swelling-it has been somewhat better she has been trying to keep them wrapped with an Ace wrap at home and that has been helping.  She is also been sleeping in a recliner with her feet propped up and is trying to keep them elevated during the day when she can she says it does help but it does make a difference.  She has not been able to get an appointment with the lymphedema clinic until the end of March.  Past Medical History:  Diagnosis Date   Arthritis    neck, back, hips, knees, ankles   Asthma    use everyday   Cancer ([place]) 2019   skin   Dyspnea    Dysrhythmia    PVCs , Afib   Heart murmur    History of kidney stones    Hyperlipidemia    Hypertension    Hyperthyroidism 08/08/2017   radio active iodine x2   Hypothyroidism    now take synthroid   Paroxysmal atrial fibrillation ([place]) 08/09/2015   PONV (postoperative nausea and vomiting)     Past Surgical History:  Procedure Laterality Date   CARDIOVERSION N/A 08/05/2016   Procedure: CARDIOVERSION;  Surgeon: Skeet Latch, MD;  Location: [facility];  Service: Cardiovascular;  Laterality: N/A;   CATARACT EXTRACTION Left 09/2012   Dr.  Bunnie Philips   LITHOTRIPSY     REPLACEMENT TOTAL KNEE BILATERAL  2011   REVERSE SHOULDER ARTHROPLASTY Left 07/19/2020   Procedure: REVERSE SHOULDER ARTHROPLASTY;  Surgeon: Justice Britain, MD;  Location[facility];  Service: Orthopedics;  Laterality: Left;  116mn   REVERSE SHOULDER ARTHROPLASTY Right 12/06/2020   Procedure: REVERSE SHOULDER ARTHROPLASTY;  Surgeon: SJustice Britain MD;  Location[facility];  Service: Orthopedics;  Laterality: Right;  1263m   THYROIDECTOMY  1971   TUBAL LIGATION  1979    Family History  Problem Relation Age of Onset   Coronary artery disease Mother [id number]     deceased   Depression Mother    Hyperlipidemia Mother    Diabetes Paternal Grandmother    Thyroid disease Sister    COPD Father    Breast cancer Neg Hx     Social History   Socioeconomic History   Marital status: Married    Spouse name: StRemo Lipps Number of children: 2   Years of education: 16   Highest education level: Bachelor's degree (e.g., BA, AB, BS)  Occupational History   Occupation: Retired RNTherapist, sports  Tobacco Use   Smoking status: Never   Smokeless tobacco: Never  Vaping Use  Vaping Use: Never used  Substance and Sexual Activity   Alcohol use: No   Drug use: No   Sexual activity: Not Currently  Other Topics Concern   Not on file  Social History Narrative   Stay around the house. Go to grocery store.    Social Determinants of Health   Financial Resource Strain: Not on file  Food Insecurity: Not on file  Transportation Needs: Not on file  Physical Activity: Not on file  Stress: Not on file  Social Connections: Not on file  Intimate Partner Violence: Not on file    Outpatient Medications Prior to Visit  Medication Sig Dispense Refill   acetaminophen (TYLENOL) 500 MG tablet Take 500 mg by mouth every 6 (six) hours as needed for moderate pain or headache.     albuterol (VENTOLIN HFA) 108 (90 Base) MCG/ACT inhaler INHALE TWO PUFFS INTO THE LUNGS EVERY 6 HOURS AS NEEDED FOR WHEEZING  OR SHORTNESS OF BREATH 8.5 g 2   atorvastatin (LIPITOR) 40 MG tablet Take 1 tablet (40 mg total) by mouth at bedtime. 90 tablet 3   Biotin 1000 MCG tablet Take 1,000 mcg by mouth daily.     Cholecalciferol (VITAMIN D) 125 MCG (5000 UT) CAPS Take 5,000 Units by mouth daily. With K2     clotrimazole-betamethasone (LOTRISONE) cream Apply 1 application topically 2 (two) times daily. (Patient taking differently: Apply 1 application topically 2 (two) times daily as needed (fungus).) 45 g 2   Coenzyme Q10 (COQ-10) 100 MG CAPS Take 100 mg by mouth daily.     fluticasone (FLONASE) 50 MCG/ACT nasal spray USE 1 SPRAY IN BOTH  NOSTRILS TWICE DAILY 48 g 3   Fluticasone Furoate (ARNUITY ELLIPTA) 100 MCG/ACT AEPB Inhale 1 Inhaler into the lungs daily. 30 each 2   furosemide (LASIX) 40 MG tablet Take 2 tablets (80 mg total) by mouth daily. 180 tablet 1   ketoconazole (NIZORAL) 2 % cream Apply topically daily as needed for irritation. (Patient taking differently: Apply 1 application topically daily as needed for irritation.) 60 g 3   loratadine (CLARITIN) 10 MG tablet Take 10 mg by mouth at bedtime.     Magnesium Citrate 200 MG TABS Take 200 mg by mouth daily.     meloxicam (MOBIC) 15 MG tablet TAKE 1 TABLET BY MOUTH  DAILY 90 tablet 3   spironolactone (ALDACTONE) 25 MG tablet Take 1 tablet (25 mg total) by mouth daily. 90 tablet 3   traMADol (ULTRAM) 50 MG tablet Take 1 tablet (50 mg total) by mouth 3 (three) times daily as needed. 90 tablet 0   TURMERIC PO Take 1,500 mg by mouth daily.     valsartan (DIOVAN) 320 MG tablet Take 1 tablet (320 mg total) by mouth daily. 90 tablet 1   vitamin B-12 (CYANOCOBALAMIN) 1000 MCG tablet Take 1,000 mcg by mouth daily.     vitamin C (ASCORBIC ACID) 500 MG tablet Take 500 mg by mouth 2 (two) times daily.     Zinc 50 MG CAPS Take 50 mg by mouth daily.     levothyroxine (SYNTHROID) 150 MCG tablet TAKE 1 TABLET BY MOUTH  DAILY BEFORE BREAKFAST TAKE EXTRA ONE-HALF TABLET BY   MOUTH 1 DAY WEEK (Patient taking differently: Take 150-225 mcg by mouth daily before breakfast. TAKE 1 TABLET BY MOUTH  DAILY BEFORE BREAKFAST TAKE EXTRA ONE-HALF TABLET BY  MOUTH 1 DAY WEEK 225 mcg on Saturday and 150 mcg all other days) 96 tablet 3   metoprolol  succinate (TOPROL-XL) 50 MG 24 hr tablet Take 2 tablets (100 mg total) by mouth daily. TAKE 2 TABLETS BY MOUTH  DAILY WITH OR IMMEDIATELY  FOLLOWING A MEAL (Patient taking differently: Take 100 mg by mouth daily. Patient currently taking 1 tablet daily) 90 tablet 3   potassium chloride (KLOR-CON) 10 MEQ tablet TAKE 1 TABLET BY MOUTH  DAILY AS NEEDED (Patient taking differently: Take 10 mEq by mouth daily as needed (Takes with Lasix).) 90 tablet 3   No facility-administered medications prior to visit.    Allergies  Allergen Reactions   Meperidine And Related Nausea And Vomiting   Oxycodone     loopy   Percocet [Oxycodone-Acetaminophen]     Feels loopy   Demerol Nausea And Vomiting    ROS Review of Systems    Objective:    Physical Exam Constitutional:      Appearance: Normal appearance. She is well-developed.  HENT:     Head: Normocephalic and atraumatic.  Cardiovascular:     Rate and Rhythm: Normal rate and regular rhythm.     Heart sounds: Normal heart sounds.  Pulmonary:     Effort: Pulmonary effort is normal.     Breath sounds: Normal breath sounds.  Skin:    General: Skin is warm and dry.  Neurological:     Mental Status: She is alert and oriented to person, place, and time.  Psychiatric:        Behavior: Behavior normal.    BP 129/80    Pulse 80    Resp 16    Ht $R'5\' 2"'iy$  (1.575 m)    Wt 213 lb (96.6 kg)    SpO2 97%    BMI 38.96 kg/m  Wt Readings from Last 3 Encounters:  04/15/21 213 lb (96.6 kg)  04/05/21 217 lb (98.4 kg)  03/26/21 214 lb (97.1 kg)     There are no preventive care reminders to display for this patient.  There are no preventive care reminders to display for this patient.  Lab Results   Component Value Date   TSH 0.30 (L) 04/05/2021   Lab Results  Component Value Date   WBC 12.3 (H) 12/06/2020   HGB 13.9 12/06/2020   HCT 42.4 12/06/2020   MCV 91.2 12/06/2020   PLT 217 12/06/2020   Lab Results  Component Value Date   NA 141 03/22/2021   K 5.1 03/22/2021   CO2 27 03/22/2021   GLUCOSE 98 03/22/2021   BUN 21 03/22/2021   CREATININE 0.79 03/22/2021   BILITOT 0.9 07/10/2020   ALKPHOS 119 08/25/2016   AST 26 07/10/2020   ALT 38 (H) 07/10/2020   PROT 6.8 07/10/2020   ALBUMIN 4.4 08/25/2016   CALCIUM 10.0 03/22/2021   ANIONGAP 7 12/06/2020   EGFR 79 03/22/2021   Lab Results  Component Value Date   CHOL 192 07/10/2020   Lab Results  Component Value Date   HDL 46 (L) 07/10/2020   Lab Results  Component Value Date   LDLCALC 117 (H) 07/10/2020   Lab Results  Component Value Date   TRIG 171 (H) 07/10/2020   Lab Results  Component Value Date   CHOLHDL 4.2 07/10/2020   Lab Results  Component Value Date   HGBA1C 5.5 10/03/2020      Assessment & Plan:   Problem List Items Addressed This Visit       Cardiovascular and Mediastinum   Essential hypertension - Primary    Pressure looks fantastic today with recent changes including  adding the spironolactone and decreasing the metoprolol to once a day.      Relevant Medications   metoprolol succinate (TOPROL-XL) 50 MG 24 hr tablet     Endocrine   Insulin resistance    A1c overall looks good today though is up just slightly from previous just encouraged her to get back on track with healthy diet and regular activity level.  Lan to recheck again in 6 months.      Hypothyroidism    Dec dose to whole tab daily.  Drop off the extra half a tab weekly.  Plan to recheck in about 8 weeks.        Relevant Medications   levothyroxine (SYNTHROID) 150 MCG tablet   metoprolol succinate (TOPROL-XL) 50 MG 24 hr tablet    Meds ordered this encounter  Medications   levothyroxine (SYNTHROID) 150 MCG tablet     Sig: Take 1 tablet (150 mcg total) by mouth daily before breakfast.    Dispense:  1 tablet    Refill:  0    Requesting 1 year supply   potassium chloride (KLOR-CON) 10 MEQ tablet    Sig: Take 2 tablets (20 mEq total) by mouth daily as needed.    Dispense:  1 tablet    Refill:  0    Requesting 1 year supply   metoprolol succinate (TOPROL-XL) 50 MG 24 hr tablet    Sig: Take 1 tablet (50 mg total) by mouth daily. Patient currently taking 1 tablet daily    Dispense:  1 tablet    Refill:  0    Requesting 1 year supply    Follow-up: Return in about 2 months (around 06/13/2021) for Check potassium and BP.  Beatrice Lecher, MD

## 2021-04-15 NOTE — Assessment & Plan Note (Signed)
Pressure looks fantastic today with recent changes including adding the spironolactone and decreasing the metoprolol to once a day.

## 2021-04-15 NOTE — Addendum Note (Signed)
Addended by: Izora Gala on: 04/15/2021 04:10 PM   Modules accepted: Orders

## 2021-04-15 NOTE — Assessment & Plan Note (Signed)
A1c overall looks good today though is up just slightly from previous just encouraged her to get back on track with healthy diet and regular activity level.  Lan to recheck again in 6 months.

## 2021-04-24 DIAGNOSIS — I890 Lymphedema, not elsewhere classified: Secondary | ICD-10-CM | POA: Diagnosis not present

## 2021-04-24 DIAGNOSIS — R29898 Other symptoms and signs involving the musculoskeletal system: Secondary | ICD-10-CM | POA: Diagnosis not present

## 2021-04-24 DIAGNOSIS — R600 Localized edema: Secondary | ICD-10-CM | POA: Diagnosis not present

## 2021-04-25 ENCOUNTER — Ambulatory Visit: Payer: Medicare Other | Admitting: Physician Assistant

## 2021-04-29 ENCOUNTER — Other Ambulatory Visit: Payer: Self-pay | Admitting: Family Medicine

## 2021-04-29 DIAGNOSIS — E890 Postprocedural hypothyroidism: Secondary | ICD-10-CM

## 2021-04-29 DIAGNOSIS — G8929 Other chronic pain: Secondary | ICD-10-CM

## 2021-04-29 DIAGNOSIS — M5136 Other intervertebral disc degeneration, lumbar region: Secondary | ICD-10-CM

## 2021-04-30 ENCOUNTER — Other Ambulatory Visit: Payer: Self-pay | Admitting: Family Medicine

## 2021-05-08 ENCOUNTER — Telehealth: Payer: Self-pay

## 2021-05-08 MED ORDER — TAMSULOSIN HCL 0.4 MG PO CAPS: 0.4000 mg | ORAL_CAPSULE | Freq: Every day | ORAL | 3 refills | Status: DC

## 2021-05-08 NOTE — Telephone Encounter (Signed)
Martha Perkins called and left a message stating she has flank pain. She reports a history of kidney stones. She states she knows she has a kidney stone. She is requesting a prescription for Flomax. Please advise.  ?

## 2021-05-08 NOTE — Telephone Encounter (Signed)
Prescription sent to [facility] per patient request.  ?

## 2021-05-08 NOTE — Telephone Encounter (Signed)
Okay to send med.  It is pended as she has about 6 different pharmacies on her file.  Was not sure which one to send it to. ?

## 2021-05-10 DIAGNOSIS — R600 Localized edema: Secondary | ICD-10-CM | POA: Diagnosis not present

## 2021-05-10 DIAGNOSIS — I890 Lymphedema, not elsewhere classified: Secondary | ICD-10-CM | POA: Diagnosis not present

## 2021-05-10 DIAGNOSIS — R29898 Other symptoms and signs involving the musculoskeletal system: Secondary | ICD-10-CM | POA: Diagnosis not present

## 2021-05-27 ENCOUNTER — Other Ambulatory Visit: Payer: Self-pay | Admitting: Family Medicine

## 2021-05-27 DIAGNOSIS — M5136 Other intervertebral disc degeneration, lumbar region: Secondary | ICD-10-CM

## 2021-05-27 DIAGNOSIS — Z1231 Encounter for screening mammogram for malignant neoplasm of breast: Secondary | ICD-10-CM

## 2021-05-27 DIAGNOSIS — G8929 Other chronic pain: Secondary | ICD-10-CM

## 2021-06-19 NOTE — Progress Notes (Addendum)
? ?Established Patient Office Visit ? ?Subjective:  ?Patient ID: Martha Perkins, female    DOB: 05-31-1945  Age: 76 y.o. MRN: [id number] ? ?CC:  ?Chief Complaint  ?Patient presents with  ? Hypertension  ? ? ?HPI ?Martha Perkins presents for  ? ?Hypertension- Pt denies chest pain, SOB, dizziness,.  She has noticed some recent increase in palpitations.  Taking meds as directed w/o problems.  Denies medication side effects.   ? ?Has been seeing physical therapy for lymphedema treatment. She has ordered the supplies ? ?Pain and popping in the left ear.  She had a tub put in that ear but not sure if ti may have come out.   No drainage or fever or chills. ? ?She also reports that she still having some irritation in the inner labia/vaginal area.  She says sometimes when she passes kidney stones they will sort of get trapped in the skin in the folds.  She says about 6 months ago this happened and it tore the skin and she says it does not seem to be healing it still bleeding intermittently it still feels itchy and irritated and she cannot see the tissue very well. ? ?Past Medical History:  ?Diagnosis Date  ? Arthritis   ? neck, back, hips, knees, ankles  ? Asthma   ? use everyday  ? Cancer ([place]) 2019  ? skin  ? Dyspnea   ? Dysrhythmia   ? PVCs , Afib  ? Heart murmur   ? History of kidney stones   ? Hyperlipidemia   ? Hypertension   ? Hyperthyroidism 08/08/2017  ? radio active iodine x2  ? Hypothyroidism   ? now take synthroid  ? Paroxysmal atrial fibrillation ([place]) 08/09/2015  ? PONV (postoperative nausea and vomiting)   ? ? ?Past Surgical History:  ?Procedure Laterality Date  ? CARDIOVERSION N/A 08/05/2016  ? Procedure: CARDIOVERSION;  Surgeon: Skeet Latch, MD;  Location: [facility];  Service: Cardiovascular;  Laterality: N/A;  ? CATARACT EXTRACTION Left 09/2012  ? Dr. Bunnie Philips  ? LITHOTRIPSY    ? REPLACEMENT TOTAL KNEE BILATERAL  2011  ? REVERSE SHOULDER ARTHROPLASTY Left 07/19/2020  ? Procedure: REVERSE SHOULDER  ARTHROPLASTY;  Surgeon: Justice Britain, MD;  Location: [facility];  Service: Orthopedics;  Laterality: Left;  1106mn  ? REVERSE SHOULDER ARTHROPLASTY Right 12/06/2020  ? Procedure: REVERSE SHOULDER ARTHROPLASTY;  Surgeon: SJustice Britain MD;  Location: [facility];  Service: Orthopedics;  Laterality: Right;  1219m  ? THYROIDECTOMY  1971  ? TUBAL LIGATION  1979  ? ? ?Family History  ?Problem Relation Age of Onset  ? Coronary artery disease Mother [id number]?     deceased  ? Depression Mother   ? Hyperlipidemia Mother   ? Diabetes Paternal Grandmother   ? Thyroid disease Sister   ? COPD Father   ? Breast cancer Neg Hx   ? ? ?Social History  ? ?Socioeconomic History  ? Marital status: Married  ?  Spouse name: StRemo Lipps? Number of children: 2  ? Years of education: 1674? Highest education level: Bachelor's degree (e.g., BA, AB, BS)  ?Occupational History  ? Occupation: Retired RNTherapist, sports  ?Tobacco Use  ? Smoking status: Never  ? Smokeless tobacco: Never  ?Vaping Use  ? Vaping Use: Never used  ?Substance and Sexual Activity  ? Alcohol use: No  ? Drug use: No  ? Sexual activity: Not Currently  ?Other Topics Concern  ? Not on file  ?Social  History Narrative  ? Stay around the house. Go to grocery store.   ? ?Social Determinants of Health  ? ?Financial Resource Strain: Not on file  ?Food Insecurity: Not on file  ?Transportation Needs: Not on file  ?Physical Activity: Not on file  ?Stress: Not on file  ?Social Connections: Not on file  ?Intimate Partner Violence: Not on file  ? ? ?Outpatient Medications Prior to Visit  ?Medication Sig Dispense Refill  ? ARNUITY ELLIPTA 100 MCG/ACT AEPB Inhale 1 puff into the lungs daily. 30 each 2  ? acetaminophen (TYLENOL) 500 MG tablet Take 500 mg by mouth every 6 (six) hours as needed for moderate pain or headache.    ? albuterol (VENTOLIN HFA) 108 (90 Base) MCG/ACT inhaler INHALE TWO PUFFS INTO THE LUNGS EVERY 6 HOURS AS NEEDED FOR WHEEZING OR SHORTNESS OF BREATH 8.5 g 2  ? atorvastatin (LIPITOR) 40 MG tablet  Take 1 tablet (40 mg total) by mouth at bedtime. 90 tablet 3  ? Biotin 1000 MCG tablet Take 1,000 mcg by mouth daily.    ? Cholecalciferol (VITAMIN D) 125 MCG (5000 UT) CAPS Take 5,000 Units by mouth daily. With K2    ? clotrimazole-betamethasone (LOTRISONE) cream Apply 1 application topically 2 (two) times daily. (Patient taking differently: Apply 1 application topically 2 (two) times daily as needed (fungus).) 45 g 2  ? Coenzyme Q10 (COQ-10) 100 MG CAPS Take 100 mg by mouth daily.    ? fluticasone (FLONASE) 50 MCG/ACT nasal spray USE 1 SPRAY IN BOTH  NOSTRILS TWICE DAILY 48 g 3  ? furosemide (LASIX) 40 MG tablet Take 2 tablets (80 mg total) by mouth daily. 180 tablet 1  ? ketoconazole (NIZORAL) 2 % cream Apply topically daily as needed for irritation. (Patient taking differently: Apply 1 application topically daily as needed for irritation.) 60 g 3  ? levothyroxine (SYNTHROID) 150 MCG tablet TAKE 1 TABLET BY MOUTH  DAILY BEFORE BREAKFAST AND  TAKE EXTRA 1/2 TABLET 1 DAY PER WEEK 90 tablet 0  ? loratadine (CLARITIN) 10 MG tablet Take 10 mg by mouth at bedtime.    ? Magnesium Citrate 200 MG TABS Take 200 mg by mouth daily.    ? meloxicam (MOBIC) 15 MG tablet TAKE 1 TABLET BY MOUTH  DAILY 90 tablet 3  ? metoprolol succinate (TOPROL-XL) 50 MG 24 hr tablet Take 1 tablet (50 mg total) by mouth daily. Patient currently taking 1 tablet daily 1 tablet 0  ? potassium chloride (KLOR-CON) 10 MEQ tablet Take 2 tablets (20 mEq total) by mouth daily as needed. 1 tablet 0  ? spironolactone (ALDACTONE) 25 MG tablet Take 1 tablet (25 mg total) by mouth daily. 90 tablet 3  ? traMADol (ULTRAM) 50 MG tablet TAKE ONE TABLET BY MOUTH THREE TIMES DAILY AS NEEDED 90 tablet 0  ? TURMERIC PO Take 1,500 mg by mouth daily.    ? valsartan (DIOVAN) 320 MG tablet Take 1 tablet (320 mg total) by mouth daily. 90 tablet 1  ? vitamin B-12 (CYANOCOBALAMIN) 1000 MCG tablet Take 1,000 mcg by mouth daily.    ? vitamin C (ASCORBIC ACID) 500 MG tablet  Take 500 mg by mouth 2 (two) times daily.    ? Zinc 50 MG CAPS Take 50 mg by mouth daily.    ? tamsulosin (FLOMAX) 0.4 MG CAPS capsule Take 1 capsule (0.4 mg total) by mouth daily. 30 capsule 3  ? ?No facility-administered medications prior to visit.  ? ? ?Allergies  ?Allergen Reactions  ?  Meperidine And Related Nausea And Vomiting  ? Oxycodone   ?  loopy  ? Percocet [Oxycodone-Acetaminophen]   ?  Feels loopy  ? Demerol Nausea And Vomiting  ? ? ?ROS ?Review of Systems ? ?  ?Objective:  ?  ?Physical Exam ?Constitutional:   ?   Appearance: Normal appearance. She is well-developed.  ?HENT:  ?   Head: Normocephalic and atraumatic.  ?   Left Ear: Ear canal normal.  ?   Ears:  ?   Comments: Tube is in place in the left TM.  No surrounding erythema or drainage and no blockage of the tube. ?Cardiovascular:  ?   Rate and Rhythm: Normal rate and regular rhythm.  ?   Heart sounds: Normal heart sounds.  ?Pulmonary:  ?   Effort: Pulmonary effort is normal.  ?   Breath sounds: Normal breath sounds.  ?Skin: ?   General: Skin is warm and dry.  ?Neurological:  ?   Mental Status: She is alert and oriented to person, place, and time.  ?Psychiatric:     ?   Behavior: Behavior normal.  ? ? ?BP (!) 127/54   Pulse 67   Ht '5\' 2"'$  (1.575 m)   Wt 214 lb (97.1 kg)   SpO2 96%   BMI 39.14 kg/m?  ?Wt Readings from Last 3 Encounters:  ?06/20/21 214 lb (97.1 kg)  ?04/15/21 213 lb (96.6 kg)  ?04/05/21 217 lb (98.4 kg)  ? ? ? ?There are no preventive care reminders to display for this patient. ? ? ?There are no preventive care reminders to display for this patient. ? ?Lab Results  ?Component Value Date  ? TSH 0.30 (L) 04/05/2021  ? ?Lab Results  ?Component Value Date  ? WBC 12.3 (H) 12/06/2020  ? HGB 13.9 12/06/2020  ? HCT 42.4 12/06/2020  ? MCV 91.2 12/06/2020  ? PLT 217 12/06/2020  ? ?Lab Results  ?Component Value Date  ? NA 141 03/22/2021  ? K 5.1 03/22/2021  ? CO2 27 03/22/2021  ? GLUCOSE 98 03/22/2021  ? BUN 21 03/22/2021  ? CREATININE 0.79  03/22/2021  ? BILITOT 0.9 07/10/2020  ? ALKPHOS 119 08/25/2016  ? AST 26 07/10/2020  ? ALT 38 (H) 07/10/2020  ? PROT 6.8 07/10/2020  ? ALBUMIN 4.4 08/25/2016  ? CALCIUM 10.0 03/22/2021  ? ANIONGAP 7 12/06/2020

## 2021-06-20 ENCOUNTER — Encounter: Payer: Self-pay | Admitting: Family Medicine

## 2021-06-20 ENCOUNTER — Ambulatory Visit (INDEPENDENT_AMBULATORY_CARE_PROVIDER_SITE_OTHER): Payer: Medicare Other | Admitting: Family Medicine

## 2021-06-20 VITALS — BP 127/54 | HR 67 | Ht 62.0 in | Wt 214.0 lb

## 2021-06-20 DIAGNOSIS — H9202 Otalgia, left ear: Secondary | ICD-10-CM

## 2021-06-20 DIAGNOSIS — G8929 Other chronic pain: Secondary | ICD-10-CM

## 2021-06-20 DIAGNOSIS — E890 Postprocedural hypothyroidism: Secondary | ICD-10-CM

## 2021-06-20 DIAGNOSIS — Z23 Encounter for immunization: Secondary | ICD-10-CM | POA: Diagnosis not present

## 2021-06-20 DIAGNOSIS — I890 Lymphedema, not elsewhere classified: Secondary | ICD-10-CM

## 2021-06-20 DIAGNOSIS — N898 Other specified noninflammatory disorders of vagina: Secondary | ICD-10-CM

## 2021-06-20 DIAGNOSIS — I10 Essential (primary) hypertension: Secondary | ICD-10-CM

## 2021-06-20 NOTE — Assessment & Plan Note (Signed)
Well controlled. Continue current regimen. Follow up in  6 mo  

## 2021-06-20 NOTE — Assessment & Plan Note (Signed)
Adjusted her dose at last office visit in January so we are now doing a 52-monthrepeat TSH.  She still noticing some palpitations intermittently. ?

## 2021-06-20 NOTE — Addendum Note (Signed)
Addended by: Beatrice Lecher D on: 06/20/2021 10:33 AM ? ? Modules accepted: Orders ? ?

## 2021-06-20 NOTE — Assessment & Plan Note (Signed)
Stable on tramadol 90 tabs per month.  Due for refill in a couple of weeks.  Plan to follow-up in 6 months.  She uses it for osteoarthritis and lumbar degenerative disc disease. ?

## 2021-06-21 ENCOUNTER — Other Ambulatory Visit: Payer: Self-pay | Admitting: Family Medicine

## 2021-06-21 DIAGNOSIS — E890 Postprocedural hypothyroidism: Secondary | ICD-10-CM

## 2021-06-21 LAB — TSH: TSH: 0.09 mIU/L — ABNORMAL LOW (ref 0.40–4.50)

## 2021-06-21 MED ORDER — LEVOTHYROXINE SODIUM 125 MCG PO TABS: 125.0000 ug | ORAL_TABLET | Freq: Every day | ORAL | 0 refills | Status: DC

## 2021-06-21 NOTE — Progress Notes (Signed)
Hi Maricia, your TSH is even lower which is a little bit confusing.  I would have expected it to look a little better.  I think you have been taking a half a tab 1 day a week and a whole tab the other 6 days.  Some actually can drop your medication down.  Going to switch her dose to 125 mcg/day.  New prescription sent to pharmacy.  So just put those 150s on hold.  And then lets plan to recheck your level again in 6 to 8 weeks. ?I did send it through your mail order so it may take a week for you to get it.  So until then take a half a tab 2 days a week of your current medication and then a whole tab 5 days, just until you get the new prescription.

## 2021-06-24 ENCOUNTER — Other Ambulatory Visit: Payer: Self-pay | Admitting: *Deleted

## 2021-06-24 DIAGNOSIS — E890 Postprocedural hypothyroidism: Secondary | ICD-10-CM

## 2021-06-27 ENCOUNTER — Ambulatory Visit: Payer: Medicare Other

## 2021-07-02 ENCOUNTER — Other Ambulatory Visit: Payer: Self-pay | Admitting: Family Medicine

## 2021-07-02 DIAGNOSIS — G8929 Other chronic pain: Secondary | ICD-10-CM

## 2021-07-02 DIAGNOSIS — M5136 Other intervertebral disc degeneration, lumbar region: Secondary | ICD-10-CM

## 2021-07-03 NOTE — Progress Notes (Signed)
? ?GYNECOLOGY OFFICE VISIT NOTE ? ?History:  ? Martha Perkins is a 76 y.o. here today for a vulvar lesions that has been present for 6 months. The skin has some itching around it as well. She notes sometimes bleeding from the breaks in the skin.  She saw her PCP who recommended evaluation here - I read Dr. Gardiner Ramus note from that encounter. She first saw NP in October about it and she tried barrier ointments of all different variety without relief.  ? ?  ?Past Medical History:  ?Diagnosis Date  ? Arthritis   ? neck, back, hips, knees, ankles  ? Asthma   ? use everyday  ? Cancer ([place]) 2019  ? skin  ? Dyspnea   ? Dysrhythmia   ? PVCs , Afib  ? Heart murmur   ? History of kidney stones   ? Hyperlipidemia   ? Hypertension   ? Hyperthyroidism 08/08/2017  ? radio active iodine x2  ? Hypothyroidism   ? now take synthroid  ? Paroxysmal atrial fibrillation ([place]) 08/09/2015  ? PONV (postoperative nausea and vomiting)   ? ? ?Past Surgical History:  ?Procedure Laterality Date  ? CARDIOVERSION N/A 08/05/2016  ? Procedure: CARDIOVERSION;  Surgeon: Skeet Latch, MD;  Location: [facility];  Service: Cardiovascular;  Laterality: N/A;  ? CATARACT EXTRACTION Left 09/2012  ? Dr. Bunnie Philips  ? LITHOTRIPSY    ? REPLACEMENT TOTAL KNEE BILATERAL  2011  ? REVERSE SHOULDER ARTHROPLASTY Left 07/19/2020  ? Procedure: REVERSE SHOULDER ARTHROPLASTY;  Surgeon: Justice Britain, MD;  Location: [facility];  Service: Orthopedics;  Laterality: Left;  160mn  ? REVERSE SHOULDER ARTHROPLASTY Right 12/06/2020  ? Procedure: REVERSE SHOULDER ARTHROPLASTY;  Surgeon: SJustice Britain MD;  Location: [facility];  Service: Orthopedics;  Laterality: Right;  1223m  ? THYROIDECTOMY  1971  ? TUBAL LIGATION  1979  ? ? ?The following portions of the patient's history were reviewed and updated as appropriate: allergies, current medications, past family history, past medical history, past social history, past surgical history and problem list.  ? ?Review of Systems:   ?Pertinent items noted in HPI and remainder of comprehensive ROS otherwise negative. ? ?Physical Exam:  ?BP 119/71   Pulse 78   Ht '5\' 2"'$  (1.575 m)   Wt 213 lb (96.6 kg)   BMI 38.96 kg/m?  ?CONSTITUTIONAL: Well-developed, well-nourished female in no acute distress.  ?HEENT:  Normocephalic, atraumatic. External right and left ear normal. No scleral icterus.  ?NECK: Normal range of motion, supple, no masses noted on observation ?SKIN: No rash noted. Not diaphoretic. No erythema. No pallor. ?MUSCULOSKELETAL: Normal range of motion. No edema noted. ?NEUROLOGIC: Alert and oriented to person, place, and time. Normal muscle tone coordination. No cranial nerve deficit noted. ?PSYCHIATRIC: Normal mood and affect. Normal behavior. Normal judgment and thought content. ? ?CARDIOVASCULAR: Normal heart rate noted ?RESPIRATORY: Effort and breath sounds normal, no problems with respiration noted ?ABDOMEN: No masses noted. No other overt distention noted.   ? ?PELVIC: Normal appearing external genitalia except internal left labia has redness and ulcerated area - similar finding on the right side in terms of the redness but no ulceration. She has white clumping over the right side consistent with likely yeast; normal urethral meatus.  ? ?Exam performed in presence of chaperone.  ? ?VULVAR BIOPSY NOTE ?The indication for vulvar biopsy  of persistent vulvar lesion  was reviewed.   Risks of the biopsy including pain, bleeding, infection, inadequate specimen, scarring and need for additional procedures  were discussed. The patient stated understanding and agreed to undergo procedure today. Consent was signed,  time out performed.  ? ?The patient's vulva was prepped with Betadine. 1% lidocaine was injected into left labia. A 3-mm punch biopsy was done, biopsy tissue was picked up with sterile forceps and sterile scissors were used to excise the lesion.  Small bleeding was noted and hemostasis was achieved using silver nitrate sticks.   The patient tolerated the procedure well.  ? ?Post-procedure instructions (pelvic rest for one week) were given to the patient. The patient is to call with heavy bleeding, fever greater than 100.4, foul smelling vaginal discharge or other concerns. The patient will be return to clinic in two weeks for discussion of results. ? ?Assessment and Plan:  ? 1. Vulvar lesion ?- Biopsy today done, therapy will be directed accordingly ?- Culture done as well given findings of white clumps and irritation/itching ?- None previously on file.  ? ? ?Routine preventative health maintenance measures emphasized. ?Please refer to After Visit Summary for other counseling recommendations.  ? ?Return if symptoms worsen or fail to improve. ? ?Radene Gunning, MD, FACOG ?Obstetrician Social research officer, government, Faculty Practice ?[facility] ? ? ? ? ? ?

## 2021-07-04 ENCOUNTER — Other Ambulatory Visit (HOSPITAL_COMMUNITY)
Admission: RE | Admit: 2021-07-04 | Discharge: 2021-07-04 | Disposition: A | Discharge status: Home / Self Care | Payer: Medicare Other | Source: Ambulatory Visit | Attending: Obstetrics and Gynecology | Admitting: Obstetrics and Gynecology

## 2021-07-04 ENCOUNTER — Ambulatory Visit: Payer: Medicare Other | Admitting: Obstetrics and Gynecology

## 2021-07-04 ENCOUNTER — Encounter: Payer: Self-pay | Admitting: Obstetrics and Gynecology

## 2021-07-04 VITALS — BP 119/71 | HR 78 | Ht 62.0 in | Wt 213.0 lb

## 2021-07-04 DIAGNOSIS — N9089 Other specified noninflammatory disorders of vulva and perineum: Secondary | ICD-10-CM | POA: Insufficient documentation

## 2021-07-05 LAB — CERVICOVAGINAL ANCILLARY ONLY
Bacterial Vaginitis (gardnerella): NEGATIVE
Candida Glabrata: NEGATIVE
Candida Vaginitis: NEGATIVE
Comment: NEGATIVE
Comment: NEGATIVE
Comment: NEGATIVE

## 2021-07-11 DIAGNOSIS — R29898 Other symptoms and signs involving the musculoskeletal system: Secondary | ICD-10-CM | POA: Diagnosis not present

## 2021-07-11 DIAGNOSIS — I890 Lymphedema, not elsewhere classified: Secondary | ICD-10-CM | POA: Diagnosis not present

## 2021-07-11 DIAGNOSIS — R600 Localized edema: Secondary | ICD-10-CM | POA: Diagnosis not present

## 2021-07-15 LAB — SURGICAL PATHOLOGY

## 2021-07-18 DIAGNOSIS — R600 Localized edema: Secondary | ICD-10-CM | POA: Diagnosis not present

## 2021-07-18 DIAGNOSIS — I890 Lymphedema, not elsewhere classified: Secondary | ICD-10-CM | POA: Diagnosis not present

## 2021-07-18 DIAGNOSIS — R29898 Other symptoms and signs involving the musculoskeletal system: Secondary | ICD-10-CM | POA: Diagnosis not present

## 2021-07-22 MED ORDER — TRIAMCINOLONE ACETONIDE 0.5 % EX OINT: 1.0000 "application " | TOPICAL_OINTMENT | Freq: Two times a day (BID) | CUTANEOUS | 3 refills | Status: DC

## 2021-07-22 NOTE — Addendum Note (Signed)
Addended by: Radene Gunning A on: 07/22/2021 09:25 AM ? ? Modules accepted: Orders ? ?

## 2021-07-24 ENCOUNTER — Encounter: Payer: Self-pay | Admitting: Physician Assistant

## 2021-07-24 ENCOUNTER — Ambulatory Visit: Payer: Medicare Other | Admitting: Physician Assistant

## 2021-07-24 DIAGNOSIS — D485 Neoplasm of uncertain behavior of skin: Secondary | ICD-10-CM | POA: Diagnosis not present

## 2021-07-24 DIAGNOSIS — Z1283 Encounter for screening for malignant neoplasm of skin: Secondary | ICD-10-CM

## 2021-07-24 DIAGNOSIS — Z85828 Personal history of other malignant neoplasm of skin: Secondary | ICD-10-CM | POA: Diagnosis not present

## 2021-07-24 DIAGNOSIS — L821 Other seborrheic keratosis: Secondary | ICD-10-CM | POA: Diagnosis not present

## 2021-07-24 DIAGNOSIS — Z86018 Personal history of other benign neoplasm: Secondary | ICD-10-CM | POA: Diagnosis not present

## 2021-07-24 DIAGNOSIS — L814 Other melanin hyperpigmentation: Secondary | ICD-10-CM | POA: Diagnosis not present

## 2021-07-24 DIAGNOSIS — D2261 Melanocytic nevi of right upper limb, including shoulder: Secondary | ICD-10-CM

## 2021-07-24 NOTE — Patient Instructions (Signed)

## 2021-07-24 NOTE — Progress Notes (Signed)
? ?Follow-Up Visit ?  ?Subjective  ?Martha Perkins is a 76 y.o. female who presents for the following: Skin Problem (Patient here today to have a lesion checked on her right arm x several months no bleeding, no pain. Per patient she also has a dark lesion on her left lower leg x years no bleeding, no pain, per patient the lesion is darker. 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 ) History of lower extremity lymph edema. She is in treatment for that.  ? ? ?The following portions of the chart were reviewed this encounter and updated as appropriate:  Tobacco  Allergies  Meds  Problems  Med Hx  Surg Hx  Fam Hx   ?  ? ?Objective  ?Well appearing patient in no apparent distress; mood and affect are within normal limits. ? ?A full examination was performed including scalp, head, eyes, ears, nose, lips, neck, chest, axillae, abdomen, back, buttocks, bilateral upper extremities, bilateral lower extremities, hands, feet, fingers, toes, fingernails, and toenails. All findings within normal limits unless otherwise noted below. ? ?Waist up skin examination- No sign of NMSC noted at the time of the visit.  ? ?Right Forearm Distal - Posterior ?Dark spindles with nesting in distal portion.  ? ? ? ? ? ? ?Right Forearm Proximal - Posterior ? ? ? ? ? ? ?Left Lower Leg - Anterior ? ? ? ? ? ? ? ?Assessment & Plan  ?Encounter for screening for malignant neoplasm of skin ? ?Yearly skin examinations ? ?Neoplasm of uncertain behavior of skin (3) ?Right Forearm Distal - Posterior ? ?Skin / nail biopsy ?Type of biopsy: tangential   ?Informed consent: discussed and consent obtained   ?Timeout: patient name, date of birth, surgical site, and procedure verified   ?Procedure prep:  Patient was prepped and draped in usual sterile fashion (Non sterile) ?Prep type:  Chlorhexidine ?Anesthesia: the lesion was anesthetized in a standard fashion   ?Anesthetic:  1% lidocaine w/ epinephrine 1-100,000 local  infiltration ?Instrument used: flexible razor blade   ?Outcome: patient tolerated procedure well   ?Post-procedure details: wound care instructions given   ? ?Specimen 1 - Surgical pathology ?Differential Diagnosis: r/o atypia ? ?Check Margins: yes ? ?Right Forearm Proximal - Posterior ? ?Skin / nail biopsy ?Type of biopsy: tangential   ?Informed consent: discussed and consent obtained   ?Timeout: patient name, date of birth, surgical site, and procedure verified   ?Procedure prep:  Patient was prepped and draped in usual sterile fashion (Non sterile) ?Prep type:  Chlorhexidine ?Anesthesia: the lesion was anesthetized in a standard fashion   ?Anesthetic:  1% lidocaine w/ epinephrine 1-100,000 local infiltration ?Instrument used: flexible razor blade   ?Outcome: patient tolerated procedure well   ?Post-procedure details: wound care instructions given   ? ?Specimen 2 - Surgical pathology ?Differential Diagnosis: r/o atypia ? ?Check Margins: yes ? ?Left Lower Leg - Anterior ? ?Skin / nail biopsy ?Type of biopsy: tangential   ?Informed consent: discussed and consent obtained   ?Timeout: patient name, date of birth, surgical site, and procedure verified   ?Procedure prep:  Patient was prepped and draped in usual sterile fashion (Non sterile) ?Prep type:  Chlorhexidine ?Anesthesia: the lesion was anesthetized in a standard fashion   ?Anesthetic:  1% lidocaine w/ epinephrine 1-100,000 local infiltration ?Instrument used: flexible razor blade   ?Outcome: patient tolerated procedure well   ?Post-procedure details: wound care instructions given   ? ?Specimen 3 - Surgical pathology ?Differential Diagnosis: r/o  atypia ? ?Check Margins: yes ? ?Informed patient of potential of slow healing of left lower extremity. Encouraged her to elevate lower extremities as much as possible.  ? ?I, Letasha Kershaw, PA-C, have reviewed all documentation's for this visit.  The documentation on 07/24/21 for the exam, diagnosis, procedures and  orders are all accurate and complete. ?

## 2021-07-25 DIAGNOSIS — I890 Lymphedema, not elsewhere classified: Secondary | ICD-10-CM | POA: Diagnosis not present

## 2021-07-25 DIAGNOSIS — R29898 Other symptoms and signs involving the musculoskeletal system: Secondary | ICD-10-CM | POA: Diagnosis not present

## 2021-07-25 DIAGNOSIS — R600 Localized edema: Secondary | ICD-10-CM | POA: Diagnosis not present

## 2021-07-29 ENCOUNTER — Telehealth: Payer: Self-pay | Admitting: *Deleted

## 2021-07-29 NOTE — Telephone Encounter (Signed)
Pathology results to patient.

## 2021-07-29 NOTE — Telephone Encounter (Signed)
-----   Message from Warren Danes, [place] sent at 07/29/2021 11:10 AM EDT ----- RTC if recurs- DN on forearm

## 2021-07-30 ENCOUNTER — Ambulatory Visit (INDEPENDENT_AMBULATORY_CARE_PROVIDER_SITE_OTHER): Payer: Medicare Other | Admitting: Family Medicine

## 2021-07-30 VITALS — BP 116/53 | HR 75 | Wt 213.0 lb

## 2021-07-30 DIAGNOSIS — E890 Postprocedural hypothyroidism: Secondary | ICD-10-CM | POA: Diagnosis not present

## 2021-07-30 DIAGNOSIS — L03116 Cellulitis of left lower limb: Secondary | ICD-10-CM | POA: Diagnosis not present

## 2021-07-30 DIAGNOSIS — I10 Essential (primary) hypertension: Secondary | ICD-10-CM | POA: Diagnosis not present

## 2021-07-30 MED ORDER — DOXYCYCLINE HYCLATE 100 MG PO TABS: 100.0000 mg | ORAL_TABLET | Freq: Two times a day (BID) | ORAL | 0 refills | Status: DC

## 2021-07-30 NOTE — Progress Notes (Signed)
Acute Office Visit  Subjective:     Patient ID: Martha Perkins, female    DOB: [birth year], 76 y.o.   MRN: [id number]  Chief Complaint  Patient presents with   Leg Pain    HPI Patient is in today for Pt reports that her L lower leg is painful and red, she has some swelling. She recently had a mole removed that came back non cancerous.  She noticed some red streaking over the last couple of days coming up her leg.  She says she has not noticed any drainage.  No fevers or chills. She has been engaged in treatments for her lymphedema   She also asked about getting a handicap placard while she was her due [facility].  They have been applying compression stocking and then a three-piece unit that goes over her legs but has been difficult to wear consistently but she has been trying.  Her husband is hiking on the [place] trail right now so she is having to do a lot of things like go to the grocery store that she would normally do and has been finding it a little bit challenging drive with them on so she like to get a handicap placard if possible.  She would also like to go ahead and get her thyroid rechecked today.  Last TSH was overly suppressed at 0.09 about a month ago.  Switched her to 125 mcg daily.  ROS      Objective:    BP (!) 116/53   Pulse 75   Wt 213 lb (96.6 kg)   SpO2 99%   BMI 38.96 kg/m    Physical Exam Vitals and nursing note reviewed.  Constitutional:      Appearance: Normal appearance. She is well-developed.  HENT:     Head: Normocephalic and atraumatic.  Pulmonary:     Effort: Pulmonary effort is normal.  Skin:    General: Skin is warm and dry.     Comments: The left lower leg she has a small wound that appears to be healing well that measures approximately 4 to 5 mm.  She has some streaking erythema coming up the leg with some mild induration over that area as well it is fairly well demarcated.  She has trace pitting edema of both lower  extremities.  Neurological:     Mental Status: She is alert and oriented to person, place, and time.  Psychiatric:        Mood and Affect: Mood normal.        Behavior: Behavior normal.    No results found for any visits on 07/30/21.      Assessment & Plan:   Problem List Items Addressed This Visit       Cardiovascular and Mediastinum   Essential hypertension    Pressure today and last time were little bit on the lower side she says it is also been a little bit low when she is gone to see the specialist.  She does have a home blood pressure cuff so encouraged her to check it a couple times over the next few weeks and let me know.  We may have some room to actually decrease her Diovan a little bit and back off on that she also has multiple diuretics but I do think there is help keep the swelling under control.  She does have some component of venous insufficiency but she mostly has lymphedema.  Endocrine   Hypothyroidism - Primary    Last TSH was a little overly suppressed.  Plan to recheck today after decreasing her dose to 125 mcg daily.       Relevant Orders   TSH   Other Visit Diagnoses     Cellulitis of left lower extremity          Cellulitis-the skin is borderline for just her typical chronic erythema and edema that may just be a little worse versus actual cellulitis from the skin excision.  I am going to go ahead and treat for 5 days with doxycycline recommend that she continue with compression.  If its not improving or getting worse please let us know.  Completed DMV form.  Meds ordered this encounter  Medications   doxycycline (VIBRA-TABS) 100 MG tablet    Sig: Take 1 tablet (100 mg total) by mouth 2 (two) times daily.    Dispense:  10 tablet    Refill:  0    No follow-ups on file.  Beatrice Lecher, MD

## 2021-07-30 NOTE — Progress Notes (Signed)
Pt reports that her L lower leg is painful and red, she has some swelling. She recently had a mole removed that came back non cancerous.   She also asked about getting a handicap placard while she was her due to her lymphedema

## 2021-07-30 NOTE — Assessment & Plan Note (Signed)
Last TSH was a little overly suppressed.  Plan to recheck today after decreasing her dose to 125 mcg daily.

## 2021-07-30 NOTE — Assessment & Plan Note (Signed)
Pressure today and last time were little bit on the lower side she says it is also been a little bit low when she is gone to see the specialist.  She does have a home blood pressure cuff so encouraged her to check it a couple times over the next few weeks and let me know.  We may have some room to actually decrease her Diovan a little bit and back off on that she also has multiple diuretics but I do think there is help keep the swelling under control.  She does have some component of venous insufficiency but she mostly has lymphedema.

## 2021-07-31 ENCOUNTER — Encounter: Payer: Self-pay | Admitting: Family Medicine

## 2021-07-31 LAB — TSH: TSH: 0.2 mIU/L — ABNORMAL LOW (ref 0.40–4.50)

## 2021-07-31 NOTE — Progress Notes (Signed)
Hi Martha Perkins,  Your thyroid level looks a little better this time so instead of 0.0 at 0.2.  Still needs to come up just a little bit more.  Some can I have you take a half a tab of your 125 mcg pill on Sundays and then a whole tab the other 6 days of the week.  Repeat this pattern for the next 2 months and then we will recheck your level again.  Make sure to take the medication away from other supplements or vitamins.

## 2021-08-13 DIAGNOSIS — I890 Lymphedema, not elsewhere classified: Secondary | ICD-10-CM | POA: Diagnosis not present

## 2021-08-13 DIAGNOSIS — R600 Localized edema: Secondary | ICD-10-CM | POA: Diagnosis not present

## 2021-08-13 DIAGNOSIS — R29898 Other symptoms and signs involving the musculoskeletal system: Secondary | ICD-10-CM | POA: Diagnosis not present

## 2021-08-15 ENCOUNTER — Ambulatory Visit (INDEPENDENT_AMBULATORY_CARE_PROVIDER_SITE_OTHER): Payer: Medicare Other

## 2021-08-15 DIAGNOSIS — Z1231 Encounter for screening mammogram for malignant neoplasm of breast: Secondary | ICD-10-CM | POA: Diagnosis not present

## 2021-08-16 DIAGNOSIS — Z96612 Presence of left artificial shoulder joint: Secondary | ICD-10-CM | POA: Diagnosis not present

## 2021-08-16 DIAGNOSIS — Z96611 Presence of right artificial shoulder joint: Secondary | ICD-10-CM | POA: Diagnosis not present

## 2021-08-16 NOTE — Progress Notes (Signed)
Please call patient. Normal mammogram.  Repeat in 1 year.  

## 2021-08-22 DIAGNOSIS — I890 Lymphedema, not elsewhere classified: Secondary | ICD-10-CM | POA: Diagnosis not present

## 2021-08-22 DIAGNOSIS — R29898 Other symptoms and signs involving the musculoskeletal system: Secondary | ICD-10-CM | POA: Diagnosis not present

## 2021-08-22 DIAGNOSIS — R600 Localized edema: Secondary | ICD-10-CM | POA: Diagnosis not present

## 2021-08-24 ENCOUNTER — Other Ambulatory Visit: Payer: Self-pay | Admitting: Family Medicine

## 2021-08-24 DIAGNOSIS — I10 Essential (primary) hypertension: Secondary | ICD-10-CM

## 2021-08-26 ENCOUNTER — Other Ambulatory Visit: Payer: Self-pay | Admitting: Family Medicine

## 2021-08-26 DIAGNOSIS — G8929 Other chronic pain: Secondary | ICD-10-CM

## 2021-08-26 DIAGNOSIS — E890 Postprocedural hypothyroidism: Secondary | ICD-10-CM

## 2021-08-26 DIAGNOSIS — M5136 Other intervertebral disc degeneration, lumbar region: Secondary | ICD-10-CM

## 2021-08-26 NOTE — Telephone Encounter (Signed)
Last note stated follow up in 6 months 90 tramadol a month but has not had filled since April.  Marland KitchenMarland KitchenPDMP not reviewed this encounter. No concerns.  Sent refills.

## 2021-08-27 ENCOUNTER — Other Ambulatory Visit: Payer: Self-pay | Admitting: *Deleted

## 2021-08-28 DIAGNOSIS — I890 Lymphedema, not elsewhere classified: Secondary | ICD-10-CM | POA: Diagnosis not present

## 2021-08-28 DIAGNOSIS — R600 Localized edema: Secondary | ICD-10-CM | POA: Diagnosis not present

## 2021-08-28 DIAGNOSIS — R29898 Other symptoms and signs involving the musculoskeletal system: Secondary | ICD-10-CM | POA: Diagnosis not present

## 2021-09-11 DIAGNOSIS — I890 Lymphedema, not elsewhere classified: Secondary | ICD-10-CM | POA: Diagnosis not present

## 2021-09-13 ENCOUNTER — Other Ambulatory Visit: Payer: Self-pay | Admitting: Family Medicine

## 2021-09-13 DIAGNOSIS — E890 Postprocedural hypothyroidism: Secondary | ICD-10-CM

## 2021-09-18 ENCOUNTER — Encounter: Payer: Self-pay | Admitting: Family Medicine

## 2021-09-18 DIAGNOSIS — I890 Lymphedema, not elsewhere classified: Secondary | ICD-10-CM | POA: Diagnosis not present

## 2021-09-19 NOTE — Telephone Encounter (Signed)
I have not seen anything in my basket.  They may need to refax it.  We can make sure that they have the correct number.

## 2021-09-20 ENCOUNTER — Telehealth: Payer: Self-pay | Admitting: Family Medicine

## 2021-09-20 DIAGNOSIS — I890 Lymphedema, not elsewhere classified: Secondary | ICD-10-CM

## 2021-09-20 NOTE — Telephone Encounter (Signed)
Okay to place new order. 

## 2021-09-20 NOTE — Telephone Encounter (Signed)
I would recommend that we contact the patient and have her call them to refax it or we can call [facility] directly if she knows the contact number.

## 2021-09-20 NOTE — Telephone Encounter (Signed)
Martha Perkins with [facility] called. Our first order for Lymphedema treatment for the patient was done under PT.They are needing a new order because they have a new physical therapist and he only does the lymphedema treatments under occupational therapy.

## 2021-09-21 NOTE — Progress Notes (Unsigned)
GYNECOLOGY OFFICE VISIT NOTE  History:   Martha Perkins is a 76 y.o. G2 here today for follow up for vulvar irritation. She had a vulvar biopsy with overall benign pathology. She was placed on a steroid ointment and then encouraged to follow up in 2 months. She is here today to follow up.   Since I last saw her she has felt better. She still has occasional burning below but overall feels better than before. Now she has some redness and odor under folds her in her skin - in her groin, under her belly and breasts.    She denies any abnormal vaginal discharge, bleeding, pelvic pain or other concerns.     Past Medical History:  Diagnosis Date   Arthritis    neck, back, hips, knees, ankles   Asthma    use everyday   Atypical mole 12/03/2017   Left Lower Shin (mild)   Atypical mole 05/21/2018   Left Deltoid (mild)   Atypical mole 05/21/2018   Left Tricep (moderate to severe)   Cancer ([place]) 2019   skin   Dyspnea    Dysrhythmia    PVCs , Afib   Heart murmur    History of kidney stones    Hyperlipidemia    Hypertension    Hyperthyroidism 08/08/2017   radio active iodine x2   Hypothyroidism    now take synthroid   Paroxysmal atrial fibrillation ([place]) 08/09/2015   PONV (postoperative nausea and vomiting)    Superficial basal cell carcinoma (BCC) 12/03/2017   Right Outer Abdomen Mid (curet and 5FU)    Past Surgical History:  Procedure Laterality Date   CARDIOVERSION N/A 08/05/2016   Procedure: CARDIOVERSION;  Surgeon: Skeet Latch, MD;  Location: [facility];  Service: Cardiovascular;  Laterality: N/A;   CATARACT EXTRACTION Left 09/2012   Dr. Bunnie Philips   LITHOTRIPSY     REPLACEMENT TOTAL KNEE BILATERAL  2011   REVERSE SHOULDER ARTHROPLASTY Left 07/19/2020   Procedure: REVERSE SHOULDER ARTHROPLASTY;  Surgeon: Justice Britain, MD;  Location: [facility];  Service: Orthopedics;  Laterality: Left;  135mn   REVERSE SHOULDER ARTHROPLASTY Right 12/06/2020   Procedure: REVERSE  SHOULDER ARTHROPLASTY;  Surgeon: SJustice Britain MD;  Location: [facility];  Service: Orthopedics;  Laterality: Right;  12100m   THYROIDECTOMY  1971   TUBAL LIGATION  1979    The following portions of the patient's history were reviewed and updated as appropriate: allergies, current medications, past family history, past medical history, past social history, past surgical history and problem list.   Health Maintenance:   Normal mammogram on 08/16/21.   Review of Systems:  Pertinent items noted in HPI and remainder of comprehensive ROS otherwise negative.  Physical Exam:  Resp 16   Ht '5\' 2"'$  (1.575 m)   Wt 218 lb (98.9 kg)   BMI 39.87 kg/m  CONSTITUTIONAL: Well-developed, well-nourished female in no acute distress.  HEENT:  Normocephalic, atraumatic. External right and left ear normal. No scleral icterus.  NECK: Normal range of motion, supple, no masses noted on observation SKIN: No rash noted. Not diaphoretic. No erythema. No pallor. MUSCULOSKELETAL: Normal range of motion. No edema noted. NEUROLOGIC: Alert and oriented to person, place, and time. Normal muscle tone coordination. No cranial nerve deficit noted. PSYCHIATRIC: Normal mood and affect. Normal behavior. Normal judgment and thought content.  CARDIOVASCULAR: Normal heart rate noted RESPIRATORY: Effort and breath sounds normal, no problems with respiration noted ABDOMEN: No masses noted. No other overt distention noted. Areas of erythema  under folds of skin c/w yeast    PELVIC: Normal appearing external genitalia except internal left and right labia with some rendess but no ulceration. Normal urethral meatus. Performed in the presence of a chaperone  Pathology from Vulvar biopsy: A. VULVA, BIOPSY:  - Inflamed squamous mucosa with mild atypia.  - See comment.   COMMENT:  There is dense, predominantly lymphoplasmacytic inflammatory infiltrate  in the superficial stroma which extends into the overlying squamous  epithelium.   There is erosion of the surface epithelium and there is  focal associated squamous atypia.  Immunohistochemistry for p16 shows  positivity along the surface of the eroded squamous epithelium which  raises the possibility of an inflamed HPV related lesion.  There is no  evidence of invasive carcinoma.   Assessment and Plan:  Martha Perkins was seen today for follow-up.  Diagnoses and all orders for this visit:  Vulvar lesion -     triamcinolone ointment (KENALOG) 0.5 %; Apply 1 Application topically 2 (two) times daily as needed (itch or burn).  Yeast infection of the skin -     nystatin (MYCOSTATIN/NYSTOP) powder; Apply 1 Application topically 3 (three) times daily as needed (redness/itch on affected skin).    Routine preventative health maintenance measures emphasized. Please refer to After Visit Summary for other counseling recommendations.   Return if symptoms worsen or fail to improve.  Radene Gunning, MD, [place] for [facility], [place]

## 2021-09-23 ENCOUNTER — Encounter: Payer: Self-pay | Admitting: Obstetrics and Gynecology

## 2021-09-23 ENCOUNTER — Ambulatory Visit: Payer: Medicare Other | Admitting: Obstetrics and Gynecology

## 2021-09-23 VITALS — Resp 16 | Ht 62.0 in | Wt 218.0 lb

## 2021-09-23 DIAGNOSIS — B372 Candidiasis of skin and nail: Secondary | ICD-10-CM

## 2021-09-23 DIAGNOSIS — N9089 Other specified noninflammatory disorders of vulva and perineum: Secondary | ICD-10-CM | POA: Diagnosis not present

## 2021-09-23 MED ORDER — NYSTATIN 100000 UNIT/GM EX POWD: 1.0000 | Freq: Three times a day (TID) | CUTANEOUS | 3 refills | Status: AC | PRN

## 2021-09-23 MED ORDER — TRIAMCINOLONE ACETONIDE 0.5 % EX OINT: 1.0000 | TOPICAL_OINTMENT | Freq: Two times a day (BID) | CUTANEOUS | 3 refills | Status: DC | PRN

## 2021-09-24 NOTE — Telephone Encounter (Signed)
Referral placed. Jenny Reichmann can you see the referral?

## 2021-09-25 ENCOUNTER — Telehealth: Payer: Self-pay | Admitting: Family Medicine

## 2021-09-25 DIAGNOSIS — I890 Lymphedema, not elsewhere classified: Secondary | ICD-10-CM

## 2021-09-25 NOTE — Telephone Encounter (Signed)
Orders Placed This Encounter  Procedures   Ambulatory referral to Occupational Therapy    Referral Priority:   Routine    Referral Type:   Occupational Therapy    Referral Reason:   Specialty Services Required    Requested Specialty:   Occupational Therapy    Number of Visits Requested:   1

## 2021-10-16 DIAGNOSIS — I890 Lymphedema, not elsewhere classified: Secondary | ICD-10-CM | POA: Diagnosis not present

## 2021-10-17 ENCOUNTER — Other Ambulatory Visit: Payer: Self-pay | Admitting: Neurology

## 2021-10-17 DIAGNOSIS — I10 Essential (primary) hypertension: Secondary | ICD-10-CM

## 2021-10-17 MED ORDER — VALSARTAN 320 MG PO TABS: 320.0000 mg | ORAL_TABLET | Freq: Every day | ORAL | 0 refills | Status: DC

## 2021-10-18 ENCOUNTER — Other Ambulatory Visit: Payer: Self-pay | Admitting: Physician Assistant

## 2021-10-18 DIAGNOSIS — G8929 Other chronic pain: Secondary | ICD-10-CM

## 2021-10-18 DIAGNOSIS — M5136 Other intervertebral disc degeneration, lumbar region: Secondary | ICD-10-CM

## 2021-10-18 NOTE — Telephone Encounter (Signed)
Last written 08/26/2021 #90 no refills Last appt 07/30/2021

## 2021-10-22 DIAGNOSIS — I890 Lymphedema, not elsewhere classified: Secondary | ICD-10-CM | POA: Diagnosis not present

## 2021-10-30 ENCOUNTER — Encounter: Payer: Self-pay | Admitting: General Practice

## 2021-10-30 DIAGNOSIS — I890 Lymphedema, not elsewhere classified: Secondary | ICD-10-CM | POA: Diagnosis not present

## 2021-11-04 ENCOUNTER — Encounter: Payer: Self-pay | Admitting: Family Medicine

## 2021-11-04 DIAGNOSIS — Z1283 Encounter for screening for malignant neoplasm of skin: Secondary | ICD-10-CM

## 2021-11-21 ENCOUNTER — Other Ambulatory Visit: Payer: Self-pay | Admitting: Family Medicine

## 2021-11-21 DIAGNOSIS — E890 Postprocedural hypothyroidism: Secondary | ICD-10-CM

## 2021-12-03 ENCOUNTER — Encounter (HOSPITAL_BASED_OUTPATIENT_CLINIC_OR_DEPARTMENT_OTHER): Payer: Self-pay | Admitting: Cardiovascular Disease

## 2021-12-03 ENCOUNTER — Ambulatory Visit (HOSPITAL_BASED_OUTPATIENT_CLINIC_OR_DEPARTMENT_OTHER): Payer: Medicare Other | Admitting: Cardiovascular Disease

## 2021-12-03 DIAGNOSIS — I700 Atherosclerosis of aorta: Secondary | ICD-10-CM

## 2021-12-03 DIAGNOSIS — I480 Paroxysmal atrial fibrillation: Secondary | ICD-10-CM | POA: Diagnosis not present

## 2021-12-03 DIAGNOSIS — I10 Essential (primary) hypertension: Secondary | ICD-10-CM | POA: Diagnosis not present

## 2021-12-03 DIAGNOSIS — I890 Lymphedema, not elsewhere classified: Secondary | ICD-10-CM | POA: Diagnosis not present

## 2021-12-03 HISTORY — DX: Lymphedema, not elsewhere classified: I89.0

## 2021-12-03 NOTE — Assessment & Plan Note (Signed)
BP is running a little high.  Her goal is <130/80.  She takes spironolactone in the evening and sometimes forgets.  She will start taking it in the morning and track her BP at home.

## 2021-12-03 NOTE — Patient Instructions (Addendum)
Medication Instructions:  Continue current medications  *If you need a refill on your cardiac medications before your next appointment, please call your pharmacy*  Lab Work: None Ordered  Testing/Procedures: None Ordered  Follow-Up: At [facility], you and your health needs are our priority.  As part of our continuing mission to provide you with exceptional heart care, we have created designated Provider Care Teams.  These Care Teams include your primary Cardiologist (physician) and Advanced Practice Providers (APPs -  Physician Assistants and Nurse Practitioners) who all work together to provide you with the care you need, when you need it.  We recommend signing up for the patient portal called "MyChart".  Sign up information is provided on this After Visit Summary.  MyChart is used to connect with patients for Virtual Visits (Telemedicine).  Patients are able to view lab/test results, encounter notes, upcoming appointments, etc.  Non-urgent messages can be sent to your provider as well.   To learn more about what you can do with MyChart, go to [email or web address].    Your next appointment:   1 year(s)  The format for your next appointment:   In Person  Provider:   Skeet Latch, MD

## 2021-12-03 NOTE — Assessment & Plan Note (Signed)
Noted on chest CT.  She also has coronary atherosclerosis.  She stopped taking her atorvastatin due to concern for potential side effects.  She was not actually having any side effects.  She did start taking Krill oil.  She sees her PCP next month and wants to see where her cholesterol is.  Her LDL needs to be less than 70.  If not, would recommend that she resume the atorvastatin.  We will refer to the prep program to help with diet and exercise.

## 2021-12-03 NOTE — Assessment & Plan Note (Signed)
No recent episodes of atrial fibrillation. She has short episodes of palpitations.  Continue metoprolol.

## 2021-12-03 NOTE — Assessment & Plan Note (Signed)
She is being at the lymphedema clinic [facility].  I think this is a good idea.  Also recommended water exercises.

## 2021-12-03 NOTE — Progress Notes (Signed)
Cardiology Office Note   Date:  12/03/2021   ID:  Martha Perkins, DOB Nov 25, 1945, MRN [id number]  PCP:  Hali Marry, MD  Cardiologist:   Skeet Latch, MD   No chief complaint on file.    History of Present Illness: Martha Perkins is a 76 y.o. female with paroxysmal atrial fibrillation, Grave's disease, hypertension, white coat hypertension, hyperlipidemia, and fatty liver disease who presents follow up on atrial fibrillation.  Martha Perkins was seen by her PCP, [name], on 07/13/15 for newly-diagnosed atrial fibrillation.  She was started on Eliquis and a low-dose beta blocker. Lab work revealed that she was hyperthyroid.  She had a thyroid uptake scan that revealed an enlarged, hyperfunctioning left lobe of the thyroid gland without discrete nodule. Findings were thought to be consistent with Graves' disease. She also underwent radioactive iodine therapy. She was seen in clinic 08/09/15 and metoprolol was increased.  Lisinopril was discontinued. She had an echo 08/2015 that revealed LVEF 50-55% with moderate left atrial enlargement, mild MR and TR.  [name] underwent cardioversion on 08/05/16.    She was started on Flovent inhaler because she was having to use her albuterol more frequently.  She thinks that this helped her breathing.  She saw Martha Perkins 10/2019 and noted some palpitations.  She wore a monitor that showed short runs of SVT but no atrial fibrillation, so anticoagulation was discontinued.  At the last visit diltiazem was discontinued due to LE edema and the fact that she was maintaining sinus rhythm. Her blood pressure was elevated so valsartan was added.   Today, she reports being diagnosed with lymphedema. She is receiving treatments from [facility]. Sometimes her palpitations are more noticeable than at other times. However, she denies any episodes of atrial fibrillation. In clinic today her blood pressure is 140/76. Last week at the community center her  blood pressure was 131/86. She states that back in July her blood pressure was lower on average. In the mornings she usually takes valsartan and metoprolol. She takes spironolactone in the evenings, although she admits to sometimes forgetting the evening doses of her medications. Her formal exercise is limited by leg pain and arthritis. She does want to work on increasing her exercise, so she signed up for classes that focus on chair exercises. Of note, she had decided to stop taking her statin and check her cholesterol levels off of it. Her last lab work showed LDL was 117, AST normal, ALT mildly elevated. Recently she started a krill oil supplement. Typically she does not consume alcohol. She denies any chest pain, or shortness of breath. No lightheadedness, headaches, syncope, orthopnea, or PND.   Past Medical History:  Diagnosis Date   Arthritis    neck, back, hips, knees, ankles   Asthma    use everyday   Atypical mole 12/03/2017   Left Lower Shin (mild)   Atypical mole 05/21/2018   Left Deltoid (mild)   Atypical mole 05/21/2018   Left Tricep (moderate to severe)   Cancer ([place]) 2019   skin   Dyspnea    Dysrhythmia    PVCs , Afib   Heart murmur    History of kidney stones    Hyperlipidemia    Hypertension    Hyperthyroidism 08/08/2017   radio active iodine x2   Hypothyroidism    now take synthroid   Lymphedema 12/03/2021   Paroxysmal atrial fibrillation ([place]) 08/09/2015   PONV (postoperative nausea and vomiting)  Superficial basal cell carcinoma (BCC) 12/03/2017   Right Outer Abdomen Mid (curet and 5FU)    Past Surgical History:  Procedure Laterality Date   CARDIOVERSION N/A 08/05/2016   Procedure: CARDIOVERSION;  Surgeon: Skeet Latch, MD;  Location: [facility];  Service: Cardiovascular;  Laterality: N/A;   CATARACT EXTRACTION Left 09/2012   Dr. Bunnie Perkins   LITHOTRIPSY     REPLACEMENT TOTAL KNEE BILATERAL  2011   REVERSE SHOULDER ARTHROPLASTY Left 07/19/2020    Procedure: REVERSE SHOULDER ARTHROPLASTY;  Surgeon: Martha Britain, MD;  Location: [facility];  Service: Orthopedics;  Laterality: Left;  158mn   REVERSE SHOULDER ARTHROPLASTY Right 12/06/2020   Procedure: REVERSE SHOULDER ARTHROPLASTY;  Surgeon: SJustice Britain MD;  Location: [facility];  Service: Orthopedics;  Laterality: Right;  1271m   THYROIDECTOMY  1971   TUBAL LIGATION  1979     Current Outpatient Medications  Medication Sig Dispense Refill   acetaminophen (TYLENOL) 500 MG tablet Take 500 mg by mouth every 6 (six) hours as needed for moderate pain or headache.     albuterol (VENTOLIN HFA) 108 (90 Base) MCG/ACT inhaler INHALE TWO PUFFS INTO THE LUNGS EVERY 6 HOURS AS NEEDED FOR WHEEZING OR SHORTNESS OF BREATH 8.5 g 2   ARNUITY ELLIPTA 100 MCG/ACT AEPB Inhale 1 puff into the lungs daily. 30 each 2   Biotin 1000 MCG tablet Take 1,000 mcg by mouth daily.     Cholecalciferol (VITAMIN D) 125 MCG (5000 UT) CAPS Take 5,000 Units by mouth daily. With K2     clotrimazole-betamethasone (LOTRISONE) cream Apply 1 application topically 2 (two) times daily. (Patient taking differently: Apply 1 application  topically 2 (two) times daily as needed (fungus).) 45 g 2   Coenzyme Q10 (COQ-10) 100 MG CAPS Take 100 mg by mouth daily.     fluticasone (FLONASE) 50 MCG/ACT nasal spray USE 1 SPRAY IN BOTH  NOSTRILS TWICE DAILY 48 g 3   furosemide (LASIX) 40 MG tablet Take 2 tablets (80 mg total) by mouth daily. 180 tablet 1   ketoconazole (NIZORAL) 2 % cream Apply topically daily as needed for irritation. (Patient taking differently: Apply 1 application  topically daily as needed for irritation.) 60 g 3   levothyroxine (SYNTHROID) 125 MCG tablet TAKE 1 TABLET BY MOUTH DAILY  BEFORE BREAKFAST 100 tablet 1   levothyroxine (SYNTHROID) 150 MCG tablet 150 ugs by oral route.     loratadine (CLARITIN) 10 MG tablet Take 10 mg by mouth at bedtime.     Magnesium Citrate 200 MG TABS Take 200 mg by mouth daily.     meloxicam  (MOBIC) 15 MG tablet TAKE 1 TABLET BY MOUTH  DAILY 100 tablet 2   metoprolol succinate (TOPROL-XL) 50 MG 24 hr tablet TAKE 2 TABLETS BY MOUTH DAILY  WITH OR IMMEDIATELY FOLLOWING A  MEAL 180 tablet 3   nystatin (MYCOSTATIN/NYSTOP) powder Apply 1 Application topically 3 (three) times daily as needed (redness/itch on affected skin). 60 g 3   potassium chloride (KLOR-CON) 10 MEQ tablet TAKE 1 TABLET BY MOUTH  DAILY AS NEEDED 100 tablet 1   spironolactone (ALDACTONE) 25 MG tablet Take 1 tablet (25 mg total) by mouth daily. 90 tablet 3   traMADol (ULTRAM) 50 MG tablet TAKE ONE TABLET BY MOUTH THREE TIMES DAILY AS NEEDED 90 tablet 0   triamcinolone ointment (KENALOG) 0.5 % Apply 1 Application topically 2 (two) times daily as needed (itch or burn). 30 g 3   TURMERIC PO Take 1,500 mg by  mouth daily.     valsartan (DIOVAN) 320 MG tablet Take 1 tablet (320 mg total) by mouth daily. 90 tablet 0   vitamin B-12 (CYANOCOBALAMIN) 1000 MCG tablet Take 1,000 mcg by mouth daily.     vitamin C (ASCORBIC ACID) 500 MG tablet Take 500 mg by mouth 2 (two) times daily.     Zinc 50 MG CAPS Take 50 mg by mouth daily.     atorvastatin (LIPITOR) 40 MG tablet Take 1 tablet (40 mg total) by mouth at bedtime. (Patient not taking: Reported on 12/03/2021) 90 tablet 3   No current facility-administered medications for this visit.    Allergies:   Meperidine, Meperidine and related, Oxycodone, Percocet [oxycodone-acetaminophen], and Demerol    Social History:  The patient  reports that she has never smoked. She has never used smokeless tobacco. She reports that she does not drink alcohol and does not use drugs.   Family History:  The patient's family history includes COPD in her father; Coronary artery disease (age of onset: 54) in her mother; Depression in her mother; Diabetes in her paternal grandmother; Hyperlipidemia in her mother; Thyroid disease in her sister.    ROS:   Please see the history of present illness. (+)  Palpitations (+) Lymphedema (+) LE pain (+) Arthralgias All other systems are reviewed and negative.    PHYSICAL EXAM:  VS:  BP 130/72 (BP Location: Left Arm, Patient Position: Sitting, Cuff Size: Large)   Pulse 78   Ht '5\' 2"'$  (1.575 m)   Wt 224 lb (101.6 kg)   BMI 40.97 kg/m  , BMI Body mass index is 40.97 kg/m. GENERAL:  Well appearing.  No acute distress HEENT:  Pupils equal round and reactive, fundi not visualized, oral mucosa unremarkable NECK:  No jugular venous distention, waveform within normal limits, carotid upstroke brisk and symmetric, no bruits LUNGS:  Clear to auscultation bilaterally HEART:  RRR.  PMI not displaced or sustained,S1 and S2 within normal limits, no S3, no S4, no clicks, no rubs, I/VI systolic murmur at LUSB ABD:  Flat, positive bowel sounds normal in frequency in pitch, no bruits, no rebound, no guarding, no midline pulsatile mass, no hepatomegaly, no splenomegaly EXT:  2 plus pulses throughout, bilateral lymphedema no cyanosis no clubbing SKIN: No rashes NEURO:  Cranial nerves II through XII grossly intact, motor grossly intact throughout PSYCH:  Cognitively intact, oriented to person place and time  EKG:   EKG is personally reviewed. 12/11/15: Atrial fibrillation. Rate 64 bpm. PVC. Prior septal infarct.  Right axis deviation. 07/18/16: Atrial fibrillation.  Rate 80 bpm.    09/23/16:  Sinus bradycardia. Rate 58 bpm. Septal infarct. 12/21/18: Sinus rhythm.  Rate 65 bpm.  Poor R wave progression. 06/03/2019: Sinus rhythm.  Rate 65 bpm.  First-degree AV block. 08/30/2020: Sinus rhythm. Rate 74 bpm. Cannot rule out prior anterior infarct. 12/03/2021:  Sinus rhythm. Rate 78 bpm.   Left LE Venous Doppler  03/26/2021: IMPRESSION: 1.  No evidence of deep vein thrombosis.   2. Nonvisualization of the distal superficial femoral, posterior tibial and peroneal veins due to edema.  CTA Chest  12/06/2020: IMPRESSION: 1. Negative for acute pulmonary embolus.  Borderline to mild cardiomegaly. 2. Partial consolidation in the right lower lobe, favored to represent atelectasis. 3. Right kidney stone 4. 3.4 cm hypodense mass in the left lobe of thyroid gland. Recommend thyroid US (ref: J Am Coll Radiol. 2015 Feb;12(2): 143-50).This may be performed non emergently   Aortic Atherosclerosis (ICD10-I70.0).  Monitor  11/18/2019: 13 Day Event Monitor   Quality: Fair.  Baseline artifact. Predominant rhythm: sinus rhythm Average heart rate: 64 bpm Max heart rate: 144 bpm Min heart rate: 49 bpm   Occasional PACs Occasional PVCs, ventricular couplets and ventricular bigeminy noted   Short runs of SVT up to 144 bpm noted  Echo 12/30/2018:  1. Left ventricular ejection fraction, by visual estimation, is 60 to  65%. The left ventricle has normal function. Normal left ventricular size.  There is no left ventricular hypertrophy.   2. Left ventricular diastolic Doppler parameters are consistent with  impaired relaxation pattern of LV diastolic filling.   3. Global right ventricle has normal systolic function.The right  ventricular size is normal. No increase in right ventricular wall  thickness.   4. Left atrial size was normal.   5. Right atrial size was normal.   6. Mild to moderate mitral annular calcification.   7. The mitral valve is normal in structure. No evidence of mitral valve  regurgitation. No evidence of mitral stenosis.   8. The tricuspid valve is normal in structure. Tricuspid valve  regurgitation was not visualized by color flow Doppler.   9. The aortic valve is tricuspid Aortic valve regurgitation was not  visualized by color flow Doppler. Mild aortic valve sclerosis without  stenosis.  10. There is Mild calcification of the aortic valve.  11. There is Mild thickening of the aortic valve.  12. The pulmonic valve was normal in structure. Pulmonic valve  regurgitation is not visualized by color flow Doppler.  13. The inferior vena  cava is normal in size with greater than 50%  respiratory variability, suggesting right atrial pressure of 3 mmHg.   Recent Labs: 12/06/2020: Hemoglobin 13.9; Platelets 217 03/22/2021: BUN 21; Creat 0.79; Potassium 5.1; Sodium 141 07/30/2021: TSH 0.20    Lipid Panel    Component Value Date/Time   CHOL 192 07/10/2020 0000   TRIG 171 (H) 07/10/2020 0000   HDL 46 (L) 07/10/2020 0000   CHOLHDL 4.2 07/10/2020 0000   VLDL 34 (H) 07/02/2015 0959   LDLCALC 117 (H) 07/10/2020 0000    Wt Readings from Last 3 Encounters:  12/03/21 224 lb (101.6 kg)  09/23/21 218 lb (98.9 kg)  07/30/21 213 lb (96.6 kg)      ASSESSMENT AND PLAN:  PAF (paroxysmal atrial fibrillation) (HCC) No recent episodes of atrial fibrillation. She has short episodes of palpitations.  Continue metoprolol.  Essential hypertension BP is running a little high.  Her goal is <130/80.  She takes spironolactone in the evening and sometimes forgets.  She will start taking it in the morning and track her BP at home.   Aortic atherosclerosis ([place]) Noted on chest CT.  She also has coronary atherosclerosis.  She stopped taking her atorvastatin due to concern for potential side effects.  She was not actually having any side effects.  She did start taking Krill oil.  She sees her PCP next month and wants to see where her cholesterol is.  Her LDL needs to be less than 70.  If not, would recommend that she resume the atorvastatin.  We will refer to the prep program to help with diet and exercise.  Lymphedema She is being at the lymphedema clinic [facility].  I think this is a good idea.  Also recommended water exercises.   Disposition:   FU with Debara Kamphuis C. Oval Linsey, MD, [facility] in 1 year.  Medication Adjustments/Labs and Tests Ordered: Current medicines are reviewed at length  with the patient today.  Concerns regarding medicines are outlined above.   Orders Placed This Encounter  Procedures   Amb Referral To Provider Referral  Exercise Program (P.R.E.P)   EKG 12-Lead   No orders of the defined types were placed in this encounter.  Patient Instructions  Medication Instructions:  Continue current medications  *If you need a refill on your cardiac medications before your next appointment, please call your pharmacy*  Lab Work: None Ordered  Testing/Procedures: None Ordered  Follow-Up: At [facility], you and your health needs are our priority.  As part of our continuing mission to provide you with exceptional heart care, we have created designated Provider Care Teams.  These Care Teams include your primary Cardiologist (physician) and Advanced Practice Providers (APPs -  Physician Assistants and Nurse Practitioners) who all work together to provide you with the care you need, when you need it.  We recommend signing up for the patient portal called "MyChart".  Sign up information is provided on this After Visit Summary.  MyChart is used to connect with patients for Virtual Visits (Telemedicine).  Patients are able to view lab/test results, encounter notes, upcoming appointments, etc.  Non-urgent messages can be sent to your provider as well.   To learn more about what you can do with MyChart, go to [email or web address].    Your next appointment:   1 year(s)  The format for your next appointment:   In Person  Provider:   Skeet Latch, MD               [facility] Stumpf,acting as a scribe for Skeet Latch, MD.,have documented all relevant documentation on the behalf of Skeet Latch, MD,as directed by  Skeet Latch, MD while in the presence of Skeet Latch, MD.  I, [place] Oval Linsey, MD have reviewed all documentation for this visit.  The documentation of the exam, diagnosis, procedures, and orders on 12/03/2021 are all accurate and complete.  Signed, Daveion Robar C. Oval Linsey, MD, [facility]  12/03/2021 1:16 PM    [facility]

## 2021-12-04 ENCOUNTER — Telehealth: Payer: Self-pay | Admitting: *Deleted

## 2021-12-04 ENCOUNTER — Other Ambulatory Visit: Payer: Self-pay | Admitting: Family Medicine

## 2021-12-04 ENCOUNTER — Encounter: Payer: Self-pay | Admitting: *Deleted

## 2021-12-04 DIAGNOSIS — U071 COVID-19: Secondary | ICD-10-CM

## 2021-12-04 DIAGNOSIS — J4521 Mild intermittent asthma with (acute) exacerbation: Secondary | ICD-10-CM

## 2021-12-04 NOTE — Patient Outreach (Signed)
  Care Coordination   12/04/2021 Name: Martha Perkins MRN: [id number] DOB: 1945/09/04   Care Coordination Outreach Attempts:  An unsuccessful telephone outreach was attempted today to offer the patient information about available care coordination services as a benefit of their health plan.   Follow Up Plan:  Additional outreach attempts will be made to offer the patient care coordination information and services.   Encounter Outcome:  No Answer left voice message requesting call back  Care Coordination Interventions Activated:  No   Care Coordination Interventions:  No, not indicated unsuccessful outreach attempt # 1    Oneta Rack, RN, BSN, CCRN Alumnus RN CM Care Coordination/ Transition of [place] Management [phone]: direct office

## 2021-12-04 NOTE — Telephone Encounter (Signed)
Contacted patient in regards to PREP class referral. Left voice message requesting return call.

## 2021-12-06 ENCOUNTER — Other Ambulatory Visit: Payer: Self-pay | Admitting: Family Medicine

## 2021-12-06 DIAGNOSIS — M5136 Other intervertebral disc degeneration, lumbar region: Secondary | ICD-10-CM

## 2021-12-06 DIAGNOSIS — G8929 Other chronic pain: Secondary | ICD-10-CM

## 2021-12-09 ENCOUNTER — Encounter: Payer: Self-pay | Admitting: *Deleted

## 2021-12-09 ENCOUNTER — Telehealth: Payer: Self-pay | Admitting: *Deleted

## 2021-12-09 NOTE — Patient Outreach (Signed)
  Care Coordination   12/09/2021 Name: [name] MRN: [id number] DOB: 1945-08-15   Care Coordination Outreach Attempts:  A second unsuccessful outreach was attempted today to offer the patient with information about available care coordination services as a benefit of their health plan.     Follow Up Plan:  Additional outreach attempts will be made to offer the patient care coordination information and services.   Encounter Outcome:  No Answer left voice message requesting callback  Care Coordination Interventions Activated:  No   Care Coordination Interventions:  No, not indicated unsuccessful outreach attempt # 2   Oneta Rack, RN, BSN, CCRN Alumnus RN CM Care Coordination/ Transition of [place] Management [phone]: direct office

## 2021-12-12 ENCOUNTER — Telehealth: Payer: Self-pay | Admitting: *Deleted

## 2021-12-12 ENCOUNTER — Encounter: Payer: Self-pay | Admitting: *Deleted

## 2021-12-12 NOTE — Patient Outreach (Signed)
  Care Coordination   12/12/2021 Name: Martha Perkins MRN: [id number] DOB: Jun 17, 1945   Care Coordination Outreach Attempts:  A third unsuccessful outreach was attempted today to offer the patient with information about available care coordination services as a benefit of their health plan.   Follow Up Plan:  No further outreach attempts will be made at this time. We have been unable to contact the patient to offer or enroll patient in care coordination services  Encounter Outcome:  No Answer left voice message requesting call back  Care Coordination Interventions Activated:  No   Care Coordination Interventions:  No, not indicated unsuccessful consecutive outreach attempt # 3 without patient call back    Oneta Rack, RN, BSN, CCRN Alumnus RN CM Care Coordination/ Transition of [place] Management [phone]: direct office

## 2021-12-23 ENCOUNTER — Encounter: Payer: Self-pay | Admitting: Family Medicine

## 2021-12-23 ENCOUNTER — Telehealth: Payer: Self-pay | Admitting: *Deleted

## 2021-12-23 ENCOUNTER — Ambulatory Visit (INDEPENDENT_AMBULATORY_CARE_PROVIDER_SITE_OTHER): Payer: Medicare Other | Admitting: Family Medicine

## 2021-12-23 VITALS — BP 121/85 | HR 97 | Ht 60.0 in | Wt 223.0 lb

## 2021-12-23 DIAGNOSIS — Z23 Encounter for immunization: Secondary | ICD-10-CM

## 2021-12-23 DIAGNOSIS — I700 Atherosclerosis of aorta: Secondary | ICD-10-CM | POA: Diagnosis not present

## 2021-12-23 DIAGNOSIS — I10 Essential (primary) hypertension: Secondary | ICD-10-CM | POA: Diagnosis not present

## 2021-12-23 DIAGNOSIS — E039 Hypothyroidism, unspecified: Secondary | ICD-10-CM | POA: Diagnosis not present

## 2021-12-23 DIAGNOSIS — E88819 Insulin resistance, unspecified: Secondary | ICD-10-CM

## 2021-12-23 DIAGNOSIS — R748 Abnormal levels of other serum enzymes: Secondary | ICD-10-CM | POA: Diagnosis not present

## 2021-12-23 DIAGNOSIS — K760 Fatty (change of) liver, not elsewhere classified: Secondary | ICD-10-CM

## 2021-12-23 DIAGNOSIS — E890 Postprocedural hypothyroidism: Secondary | ICD-10-CM

## 2021-12-23 DIAGNOSIS — Z79899 Other long term (current) drug therapy: Secondary | ICD-10-CM

## 2021-12-23 NOTE — Telephone Encounter (Signed)
Contacted patient regarding PREP Class referral. I have left 2 voice messages this month about participating in November/December classes without response.

## 2021-12-23 NOTE — Assessment & Plan Note (Signed)
4 pounds with some dietary change which is fantastic.  Keep up the good work.

## 2021-12-23 NOTE — Assessment & Plan Note (Addendum)
Initial blood pressure elevated we will recheck again today before she goes.  Her cardiologist wanted her blood pressure pretty consistently under 130.  Repeat blood pressure looks perfect today!

## 2021-12-23 NOTE — Progress Notes (Addendum)
Established Patient Office Visit  Subjective   Patient ID: Martha Perkins, female    DOB: 06-Mar-1946  Age: 76 y.o. MRN: [id number]  Chief Complaint  Patient presents with   Follow-up    HPI  Hypertension- Pt denies chest pain, SOB, dizziness, or heart palpitations.  Taking meds as directed w/o problems.  Denies medication side effects.  Only seen by cardiology they want her blood pressure goal consistently less than 130/80.  She did move her spironolactone to the mornings and has definitely been much more consistent in taking it.  Though she says she did not take it this morning because she was fasting for labs and it said to take with food and she did not want to upset her stomach.  She did bring in her home blood pressure cuff to compare but left the main unit at home.  Hypothyroidism - Taking medication regularly in the AM away from food and vitamins, etc. No recent change to skin, hair, or energy levels.  Currently taking 125 mcg 6 days a week and a half a tab on Saturdays.  Working on adjusting her diet and working on weight loss.  Still dealing with her chronic lymphedema but no recent increase in swelling.    ROS    Objective:     BP 121/85   Pulse 97   Ht 5' (1.524 m)   Wt 223 lb (101.2 kg)   SpO2 99%   BMI 43.55 kg/m    Physical Exam Vitals and nursing note reviewed.  Constitutional:      Appearance: She is well-developed.  HENT:     Head: Normocephalic and atraumatic.  Cardiovascular:     Rate and Rhythm: Normal rate and regular rhythm.     Heart sounds: Normal heart sounds.  Pulmonary:     Effort: Pulmonary effort is normal.     Breath sounds: Normal breath sounds.  Skin:    General: Skin is warm and dry.  Neurological:     Mental Status: She is alert and oriented to person, place, and time.  Psychiatric:        Behavior: Behavior normal.      No results found for any visits on 12/23/21.    The 10-year ASCVD risk score (Arnett DK, et al.,  2019) is: 17.2%    Assessment & Plan:   Problem List Items Addressed This Visit       Cardiovascular and Mediastinum   Essential hypertension, goal Less than 130/85 - Primary    Initial blood pressure elevated we will recheck again today before she goes.  Her cardiologist wanted her blood pressure pretty consistently under 130.  Repeat blood pressure looks perfect today!      Relevant Orders   COMPLETE METABOLIC PANEL WITH GFR   CBC w/Diff/Platelet   Lipid Panel w/reflex Direct LDL   Aortic atherosclerosis ([place]), LDL goal < 70    We will plan to recheck lipids today LDL goal needs to be less than 70 or she will need to restart her statin.  She has been holding it for several months at this point.        Digestive   Fatty liver    Due to recheck enzymes        Endocrine   Insulin resistance    Really is doing great she has made some changes in her diet and has cut back on some sugar and carbs.  In fact she is actually lost about 4 pounds  in the last month which is great.  Just encouraged her to continue to work at it for all of her health benefits including blood sugar levels and blood pressures.      Relevant Orders   Hemoglobin A1c   Hypothyroidism    Recheck TSH and make adjustments as needed today.        Other   Morbid obesity ([place])    4 pounds with some dietary change which is fantastic.  Keep up the good work.      Other Visit Diagnoses     Medication management       Relevant Orders   COMPLETE METABOLIC PANEL WITH GFR   CBC w/Diff/Platelet   Lipid Panel w/reflex Direct LDL       No follow-ups on file.    Beatrice Lecher, MD

## 2021-12-23 NOTE — Assessment & Plan Note (Signed)
Due to recheck enzymes

## 2021-12-23 NOTE — Assessment & Plan Note (Signed)
Recheck TSH and make adjustments as needed today.

## 2021-12-23 NOTE — Patient Instructions (Signed)
If blood pressure still high today then I left you to come back in about 3 weeks and bring in your home blood pressure cuff if possible.

## 2021-12-23 NOTE — Assessment & Plan Note (Signed)
Really is doing great she has made some changes in her diet and has cut back on some sugar and carbs.  In fact she is actually lost about 4 pounds in the last month which is great.  Just encouraged her to continue to work at it for all of her health benefits including blood sugar levels and blood pressures.

## 2021-12-23 NOTE — Addendum Note (Signed)
Addended by: Beatrice Lecher D on: 12/23/2021 05:32 PM   Modules accepted: Orders

## 2021-12-23 NOTE — Assessment & Plan Note (Signed)
We will plan to recheck lipids today LDL goal needs to be less than 70 or she will need to restart her statin.  She has been holding it for several months at this point.

## 2021-12-24 ENCOUNTER — Encounter: Payer: Self-pay | Admitting: Family Medicine

## 2021-12-24 NOTE — Addendum Note (Signed)
Addended by: Beatrice Lecher D on: 12/24/2021 12:38 PM   Modules accepted: Orders

## 2021-12-24 NOTE — Progress Notes (Signed)
Martha Perkins, your complete metabolic panel looks good, except your liver functions which are quite elevated.  Last time we checked your liver function and it was just a little borderline elevated but this time it is increased.  I would like to do some additional blood work as well as an ultrasound of your liver.  Your LDL cholesterol has jumped up significantly and is in a high risk range for stroke and heart attack.  Please restart your atorvastatin.  Orders Placed This Encounter     US ABDOMEN COMPLETE W/ELASTOGRAPHY         Standing Status: Future         Standing Expiration Date: 12/25/2022         Order Specific Question: Reason for Exam (SYMPTOM  OR DIAGNOSIS REQUIRED)         Answer: Evaded liver enzymes.         Order Specific Question: Preferred imaging location?         Answer: [facility]     Flu vaccine HIGH DOSE PF (Fluzone High dose)     COMPLETE METABOLIC PANEL WITH GFR     CBC w/Diff/Platelet     Lipid Panel w/reflex Direct LDL     Hemoglobin A1c     Iron, TIBC and Ferritin Panel     Tissue Transglutaminase Abs,IgG,IgA     Protime-INR     Mitochondrial antibodies     Anti-smooth muscle antibody, IgG     ANA     IgA     CBC     Hepatic function panel

## 2021-12-25 NOTE — Telephone Encounter (Signed)
Add TSH Req #: [id number]

## 2021-12-26 LAB — CBC WITH DIFFERENTIAL/PLATELET
Absolute Monocytes: 515 cells/uL (ref 200–950)
Basophils Absolute: 59 cells/uL (ref 0–200)
Basophils Relative: 1.2 %
Eosinophils Absolute: 260 cells/uL (ref 15–500)
Eosinophils Relative: 5.3 %
HCT: 41.8 % (ref 35.0–45.0)
Hemoglobin: 14.2 g/dL (ref 11.7–15.5)
Lymphs Abs: 2190 cells/uL (ref 850–3900)
MCH: 31.9 pg (ref 27.0–33.0)
MCHC: 34 g/dL (ref 32.0–36.0)
MCV: 93.9 fL (ref 80.0–100.0)
MPV: 12 fL (ref 7.5–12.5)
Monocytes Relative: 10.5 %
Neutro Abs: 1877 cells/uL (ref 1500–7800)
Neutrophils Relative %: 38.3 %
Platelets: 188 10*3/uL (ref 140–400)
RBC: 4.45 10*6/uL (ref 3.80–5.10)
RDW: 12.1 % (ref 11.0–15.0)
Total Lymphocyte: 44.7 %
WBC: 4.9 10*3/uL (ref 3.8–10.8)

## 2021-12-26 LAB — HEMOGLOBIN A1C
Hgb A1c MFr Bld: 6.2 % of total Hgb — ABNORMAL HIGH (ref <5.7)
Mean Plasma Glucose: 131 mg/dL
eAG (mmol/L): 7.3 mmol/L

## 2021-12-26 LAB — LIPID PANEL W/REFLEX DIRECT LDL
Cholesterol: 289 mg/dL — ABNORMAL HIGH (ref <200)
HDL: 49 mg/dL — ABNORMAL LOW (ref >=50)
LDL Cholesterol (Calc): 202 mg/dL (calc) — ABNORMAL HIGH
Non-HDL Cholesterol (Calc): 240 mg/dL (calc) — ABNORMAL HIGH (ref <130)
Total CHOL/HDL Ratio: 5.9 (calc) — ABNORMAL HIGH (ref <5.0)
Triglycerides: 202 mg/dL — ABNORMAL HIGH (ref <150)

## 2021-12-26 LAB — TSH(REFL): TSH: 1.93 mIU/L (ref 0.40–4.50)

## 2021-12-26 LAB — COMPLETE METABOLIC PANEL WITH GFR
AG Ratio: 1.6 (calc) (ref 1.0–2.5)
ALT: 116 U/L — ABNORMAL HIGH (ref 6–29)
AST: 73 U/L — ABNORMAL HIGH (ref 10–35)
Albumin: 4.5 g/dL (ref 3.6–5.1)
Alkaline phosphatase (APISO): 64 U/L (ref 37–153)
BUN: 20 mg/dL (ref 7–25)
CO2: 28 mmol/L (ref 20–32)
Calcium: 10 mg/dL (ref 8.6–10.4)
Chloride: 104 mmol/L (ref 98–110)
Creat: 0.8 mg/dL (ref 0.60–1.00)
Globulin: 2.9 g/dL (calc) (ref 1.9–3.7)
Glucose, Bld: 102 mg/dL — ABNORMAL HIGH (ref 65–99)
Potassium: 4.9 mmol/L (ref 3.5–5.3)
Sodium: 139 mmol/L (ref 135–146)
Total Bilirubin: 1 mg/dL (ref 0.2–1.2)
Total Protein: 7.4 g/dL (ref 6.1–8.1)
eGFR: 77 mL/min/{1.73_m2} (ref >=60)

## 2021-12-26 LAB — REFLEX TIQ

## 2021-12-27 DIAGNOSIS — Z79899 Other long term (current) drug therapy: Secondary | ICD-10-CM | POA: Diagnosis not present

## 2021-12-27 DIAGNOSIS — I10 Essential (primary) hypertension: Secondary | ICD-10-CM | POA: Diagnosis not present

## 2021-12-27 DIAGNOSIS — K760 Fatty (change of) liver, not elsewhere classified: Secondary | ICD-10-CM | POA: Diagnosis not present

## 2021-12-27 DIAGNOSIS — R748 Abnormal levels of other serum enzymes: Secondary | ICD-10-CM | POA: Diagnosis not present

## 2021-12-30 LAB — CBC
HCT: 42.3 % (ref 35.0–45.0)
Hemoglobin: 14.4 g/dL (ref 11.7–15.5)
MCH: 31.9 pg (ref 27.0–33.0)
MCHC: 34 g/dL (ref 32.0–36.0)
MCV: 93.6 fL (ref 80.0–100.0)
MPV: 11.8 fL (ref 7.5–12.5)
Platelets: 206 10*3/uL (ref 140–400)
RBC: 4.52 10*6/uL (ref 3.80–5.10)
RDW: 12.1 % (ref 11.0–15.0)
WBC: 5.8 10*3/uL (ref 3.8–10.8)

## 2021-12-30 LAB — HEPATIC FUNCTION PANEL
AG Ratio: 1.6 (calc) (ref 1.0–2.5)
ALT: 122 U/L — ABNORMAL HIGH (ref 6–29)
AST: 78 U/L — ABNORMAL HIGH (ref 10–35)
Albumin: 4.4 g/dL (ref 3.6–5.1)
Alkaline phosphatase (APISO): 66 U/L (ref 37–153)
Bilirubin, Direct: 0.2 mg/dL (ref 0.0–0.2)
Globulin: 2.8 g/dL (calc) (ref 1.9–3.7)
Indirect Bilirubin: 0.7 mg/dL (calc) (ref 0.2–1.2)
Total Bilirubin: 0.9 mg/dL (ref 0.2–1.2)
Total Protein: 7.2 g/dL (ref 6.1–8.1)

## 2021-12-30 LAB — IGA: Immunoglobulin A: 247 mg/dL (ref 70–320)

## 2021-12-30 LAB — ANTI-NUCLEAR AB-TITER (ANA TITER): ANA Titer 1: 1:80 {titer} — ABNORMAL HIGH

## 2021-12-30 LAB — PROTIME-INR
INR: 1
Prothrombin Time: 10.7 s (ref 9.0–11.5)

## 2021-12-30 LAB — TISSUE TRANSGLUTAMINASE ABS,IGG,IGA
(tTG) Ab, IgA: <1 U/mL
(tTG) Ab, IgG: <1 U/mL

## 2021-12-30 LAB — ANA: Anti Nuclear Antibody (ANA): POSITIVE — AB

## 2021-12-30 LAB — IRON,TIBC AND FERRITIN PANEL
%SAT: 44 % (calc) (ref 16–45)
Ferritin: 631 ng/mL — ABNORMAL HIGH (ref 16–288)
Iron: 151 ug/dL (ref 45–160)
TIBC: 342 mcg/dL (calc) (ref 250–450)

## 2021-12-30 LAB — MITOCHONDRIAL ANTIBODIES: Mitochondrial M2 Ab, IgG: <=20 U (ref <=20.0)

## 2021-12-30 LAB — ANTI-SMOOTH MUSCLE ANTIBODY, IGG: Actin (Smooth Muscle) Antibody (IGG): <20 U (ref <20)

## 2022-01-02 ENCOUNTER — Encounter: Payer: Self-pay | Admitting: Family Medicine

## 2022-01-03 ENCOUNTER — Encounter (HOSPITAL_COMMUNITY): Payer: Self-pay

## 2022-01-03 ENCOUNTER — Ambulatory Visit (HOSPITAL_COMMUNITY): Payer: Medicare Other

## 2022-01-03 ENCOUNTER — Telehealth: Payer: Self-pay | Admitting: Family Medicine

## 2022-01-03 ENCOUNTER — Ambulatory Visit (HOSPITAL_COMMUNITY)
Admission: RE | Admit: 2022-01-03 | Discharge: 2022-01-03 | Disposition: A | Discharge status: Home / Self Care | Payer: Medicare Other | Source: Ambulatory Visit | Attending: Family Medicine | Admitting: Family Medicine

## 2022-01-03 DIAGNOSIS — Z79899 Other long term (current) drug therapy: Secondary | ICD-10-CM | POA: Insufficient documentation

## 2022-01-03 DIAGNOSIS — R748 Abnormal levels of other serum enzymes: Secondary | ICD-10-CM | POA: Diagnosis not present

## 2022-01-03 DIAGNOSIS — K760 Fatty (change of) liver, not elsewhere classified: Secondary | ICD-10-CM

## 2022-01-03 DIAGNOSIS — R7989 Other specified abnormal findings of blood chemistry: Secondary | ICD-10-CM | POA: Diagnosis not present

## 2022-01-03 DIAGNOSIS — I10 Essential (primary) hypertension: Secondary | ICD-10-CM | POA: Diagnosis not present

## 2022-01-03 NOTE — Progress Notes (Signed)
Blondina, sorry for the delay in getting back to you on results I know they have been trickling in.  The ferritin was a little bit elevated this is probably from inflammation not because your iron stores are very hide usually when we see this level it is from inflammation going on in the body.  The ANA came back positive this is a test that evaluates for possible lupus it does not 100% diagnose it but it can suggest a possible autoimmune causes.  No sign of celiac disease. I will look out for your ultrasound results this weekend.

## 2022-01-03 NOTE — Telephone Encounter (Signed)
Left patient a voicemail on 10/27 to schedule AWVS -Buel Ream

## 2022-01-06 ENCOUNTER — Other Ambulatory Visit: Payer: Self-pay | Admitting: *Deleted

## 2022-01-06 ENCOUNTER — Other Ambulatory Visit: Payer: Self-pay | Admitting: Physician Assistant

## 2022-01-06 DIAGNOSIS — R935 Abnormal findings on diagnostic imaging of other abdominal regions, including retroperitoneum: Secondary | ICD-10-CM

## 2022-01-06 DIAGNOSIS — Q441 Other congenital malformations of gallbladder: Secondary | ICD-10-CM | POA: Insufficient documentation

## 2022-01-06 DIAGNOSIS — R748 Abnormal levels of other serum enzymes: Secondary | ICD-10-CM

## 2022-01-06 DIAGNOSIS — K760 Fatty (change of) liver, not elsewhere classified: Secondary | ICD-10-CM

## 2022-01-06 NOTE — Progress Notes (Signed)
Referral placed per patients request 

## 2022-01-06 NOTE — Progress Notes (Signed)
HI Stephen, your liver US shows no lesion on the liver. It does confirm fatty liver so we will need to continue to work nutrition and exercise to reduce the fat in the liver. They did see a possible polyp vs stone in the gallbladder. I would like to refer you to GI. DO you have a preference for provider or location?  They also saw some possible atrophy of both of they kidneys though your kidney function is good.  We will need to keep an eye on this.

## 2022-01-06 NOTE — Progress Notes (Signed)
She would have to go to [place] to stay in [place]. Is that OK or we can send to [facility] in [place]

## 2022-01-07 ENCOUNTER — Other Ambulatory Visit: Payer: Self-pay | Admitting: Family Medicine

## 2022-01-07 DIAGNOSIS — G8929 Other chronic pain: Secondary | ICD-10-CM

## 2022-01-07 DIAGNOSIS — I10 Essential (primary) hypertension: Secondary | ICD-10-CM

## 2022-01-07 DIAGNOSIS — M5136 Other intervertebral disc degeneration, lumbar region: Secondary | ICD-10-CM

## 2022-01-13 DIAGNOSIS — R748 Abnormal levels of other serum enzymes: Secondary | ICD-10-CM | POA: Diagnosis not present

## 2022-01-13 DIAGNOSIS — R932 Abnormal findings on diagnostic imaging of liver and biliary tract: Secondary | ICD-10-CM | POA: Diagnosis not present

## 2022-01-13 DIAGNOSIS — K760 Fatty (change of) liver, not elsewhere classified: Secondary | ICD-10-CM | POA: Diagnosis not present

## 2022-01-15 DIAGNOSIS — I890 Lymphedema, not elsewhere classified: Secondary | ICD-10-CM | POA: Diagnosis not present

## 2022-01-17 ENCOUNTER — Ambulatory Visit (INDEPENDENT_AMBULATORY_CARE_PROVIDER_SITE_OTHER): Payer: Medicare Other | Admitting: Family Medicine

## 2022-01-17 DIAGNOSIS — Z0000 Encounter for general adult medical examination without abnormal findings: Secondary | ICD-10-CM | POA: Diagnosis not present

## 2022-01-17 DIAGNOSIS — Z780 Asymptomatic menopausal state: Secondary | ICD-10-CM

## 2022-01-17 NOTE — Telephone Encounter (Signed)
Patient called both legs red and painful fluid is coming out of legs as well She would like a call back

## 2022-01-17 NOTE — Telephone Encounter (Signed)
Needs appt

## 2022-01-17 NOTE — Patient Instructions (Addendum)
[facility] Maintenance Summary and Written Plan of Care  Martha Perkins ,  Thank you for allowing me to perform your Medicare Annual Wellness Visit and for your ongoing commitment to your health.   Health Maintenance & Immunization History Health Maintenance  Topic Date Due   COVID-19 Vaccine (4 - Moderna risk series) 07/07/2022 (Originally 03/20/2020)   Medicare Annual Wellness (AWV)  01/18/2023   MAMMOGRAM  08/16/2023   COLONOSCOPY (Pts 45-76yr Insurance coverage will need to be confirmed)  02/19/2025   TETANUS/TDAP  07/01/2025   Pneumonia Vaccine 74 Years old  Completed   INFLUENZA VACCINE  Completed   DEXA SCAN  Completed   Hepatitis C Screening  Completed   Zoster Vaccines- Shingrix  Completed   HPV VACCINES  Aged Out   Immunization History  Administered Date(s) Administered   Fluad Quad(high Dose 65+) 12/29/2018, 12/26/2019   Influenza Split 01/27/2012, 01/01/2017   Influenza, High Dose Seasonal PF 01/29/2018, 12/23/2021   Influenza,inj,Quad PF,6+ Mos 11/26/2012, 12/29/2013, 12/14/2014, 11/13/2015   Moderna Sars-Covid-2 Vaccination 06/14/2019, 07/12/2019, 01/24/2020   PNEUMOCOCCAL CONJUGATE-20 06/20/2021   PPD Test 05/18/2018   Pneumococcal Conjugate-13 12/29/2013   Pneumococcal Polysaccharide-23 11/26/2012   Tdap 07/02/2015   Zoster Recombinat (Shingrix) 06/11/2020, 09/19/2020    These are the patient goals that we discussed:  Goals Addressed               This Visit's Progress     Patient Stated (pt-stated)        Patient would like to loose 60 lbs.         This is a list of Health Maintenance Items that are overdue or due now: Bone densitometry screening    Orders/Referrals Placed Today: Orders Placed This Encounter  Procedures   DEXAScan    Standing Status:   Future    Standing Expiration Date:   01/18/2023    Scheduling Instructions:     Please call patient to schedule.    Order Specific Question:   Reason for exam:     Answer:   post menopausal    Order Specific Question:   Preferred imaging location?    Answer:   [facility]   (Contact our referral department at [phone] you have not spoken with someone about your referral appointment within the next 5 days)    Follow-up Plan Follow-up with MHali Marry MD as planned Medicare wellness visit in one year.  Patient will access AVS on my chart.      Health Maintenance, Female Adopting a healthy lifestyle and getting preventive care are important in promoting health and wellness. Ask your health care provider about: The right schedule for you to have regular tests and exams. Things you can do on your own to prevent diseases and keep yourself healthy. What should I know about diet, weight, and exercise? Eat a healthy diet  Eat a diet that includes plenty of vegetables, fruits, low-fat dairy products, and lean protein. Do not eat a lot of foods that are high in solid fats, added sugars, or sodium. Maintain a healthy weight Body mass index (BMI) is used to identify weight problems. It estimates body fat based on height and weight. Your health care provider can help determine your BMI and help you achieve or maintain a healthy weight. Get regular exercise Get regular exercise. This is one of the most important things you can do for your health. Most adults should: Exercise for at least 150 minutes each  week. The exercise should increase your heart rate and make you sweat (moderate-intensity exercise). Do strengthening exercises at least twice a week. This is in addition to the moderate-intensity exercise. Spend less time sitting. Even light physical activity can be beneficial. Watch cholesterol and blood lipids Have your blood tested for lipids and cholesterol at 74 years of age, then have this test every 5 years. Have your cholesterol levels checked more often if: Your lipid or cholesterol levels are high. You are older  than 74 years of age. You are at high risk for heart disease. What should I know about cancer screening? Depending on your health history and family history, you may need to have cancer screening at various ages. This may include screening for: Breast cancer. Cervical cancer. Colorectal cancer. Skin cancer. Lung cancer. What should I know about heart disease, diabetes, and high blood pressure? Blood pressure and heart disease High blood pressure causes heart disease and increases the risk of stroke. This is more likely to develop in people who have high blood pressure readings or are overweight. Have your blood pressure checked: Every 3-5 years if you are 24-63 years of age. Every year if you are 11 years old or older. Diabetes Have regular diabetes screenings. This checks your fasting blood sugar level. Have the screening done: Once every three years after age 20 if you are at a normal weight and have a low risk for diabetes. More often and at a younger age if you are overweight or have a high risk for diabetes. What should I know about preventing infection? Hepatitis B If you have a higher risk for hepatitis B, you should be screened for this virus. Talk with your health care provider to find out if you are at risk for hepatitis B infection. Hepatitis C Testing is recommended for: Everyone born from [birth year] through 1963. Anyone with known risk factors for hepatitis C. Sexually transmitted infections (STIs) Get screened for STIs, including gonorrhea and chlamydia, if: You are sexually active and are younger than 74 years of age. You are older than 74 years of age and your health care provider tells you that you are at risk for this type of infection. Your sexual activity has changed since you were last screened, and you are at increased risk for chlamydia or gonorrhea. Ask your health care provider if you are at risk. Ask your health care provider about whether you are at high risk for  HIV. Your health care provider may recommend a prescription medicine to help prevent HIV infection. If you choose to take medicine to prevent HIV, you should first get tested for HIV. You should then be tested every 3 months for as long as you are taking the medicine. Pregnancy If you are about to stop having your period (premenopausal) and you may become pregnant, seek counseling before you get pregnant. Take 400 to 800 micrograms (mcg) of folic acid every day if you become pregnant. Ask for birth control (contraception) if you want to prevent pregnancy. Osteoporosis and menopause Osteoporosis is a disease in which the bones lose minerals and strength with aging. This can result in bone fractures. If you are 75 years old or older, or if you are at risk for osteoporosis and fractures, ask your health care provider if you should: Be screened for bone loss. Take a calcium or vitamin D supplement to lower your risk of fractures. Be given hormone replacement therapy (HRT) to treat symptoms of menopause. Follow these instructions at home:  Alcohol use Do not drink alcohol if: Your health care provider tells you not to drink. You are pregnant, may be pregnant, or are planning to become pregnant. If you drink alcohol: Limit how much you have to: 0-1 drink a day. Know how much alcohol is in your drink. In the [place] one drink equals one 12 oz bottle of beer (355 mL), one 5 oz glass of wine (148 mL), or one 1 oz glass of hard liquor (44 mL). Lifestyle Do not use any products that contain nicotine or tobacco. These products include cigarettes, chewing tobacco, and vaping devices, such as e-cigarettes. If you need help quitting, ask your health care provider. Do not use street drugs. Do not share needles. Ask your health care provider for help if you need support or information about quitting drugs. General instructions Schedule regular health, dental, and eye exams. Stay current with your  vaccines. Tell your health care provider if: You often feel depressed. You have ever been abused or do not feel safe at home. Summary Adopting a healthy lifestyle and getting preventive care are important in promoting health and wellness. Follow your health care provider's instructions about healthy diet, exercising, and getting tested or screened for diseases. Follow your health care provider's instructions on monitoring your cholesterol and blood pressure. This information is not intended to replace advice given to you by your health care provider. Make sure you discuss any questions you have with your health care provider. Document Revised: 07/16/2020 Document Reviewed: 07/16/2020 Elsevier Patient Education  [place].

## 2022-01-17 NOTE — Progress Notes (Signed)
MEDICARE ANNUAL WELLNESS VISIT  01/17/2022  Telephone Visit Disclaimer This Medicare AWV was conducted by telephone due to national recommendations for restrictions regarding the COVID-19 Pandemic (e.g. social distancing).  I verified, using two identifiers, that I am speaking with Martha Perkins or their authorized healthcare agent. I discussed the limitations, risks, security, and privacy concerns of performing an evaluation and management service by telephone and the potential availability of an in-person appointment in the future. The patient expressed understanding and agreed to proceed.  Location of Patient: Home Location of Provider (nurse):  Provider home  Subjective:    Martha Perkins is a 76 y.o. female patient of Metheney, Rene Kocher, MD who had a Medicare Annual Wellness Visit today via telephone. Martha Perkins is Retired and lives with their spouse. she has 2 children. she reports that she is socially active and does interact with friends/family regularly. she is minimally physically active and enjoys singing.  Patient Care Team: Hali Marry, MD as PCP - General (Family Medicine) Renato Shin, MD (Inactive) as Consulting Physician (Endocrinology) Darius Bump, [facility] (Pharmacist) Darius Bump, [facility] as Pharmacist (Pharmacist) Starlyn Skeans as Physician Assistant (Dermatology)     01/17/2022    3:01 PM 12/27/2020    3:51 PM 12/06/2020   12:49 PM 11/21/2020    1:11 PM 08/15/2020    2:00 PM 07/11/2020   11:28 AM 04/19/2019    3:00 PM  Advanced Directives  Does Patient Have a Medical Advance Directive? No No No No No No No  Would patient like information on creating a medical advance directive? No - Patient declined  No - Patient declined No - Patient declined  Yes (MAU/Ambulatory/Procedural Areas - Information given) No - Patient declined    Hospital Utilization Over the Past 12 Months: # of hospitalizations or ER visits: 0 # of surgeries: 0  Review of  Systems    Patient reports that her overall health is worse compared to last year.  History obtained from chart review and the patient  Patient Reported Readings (BP, Pulse, CBG, Weight, etc) none  Pain Assessment Pain : 0-10 Pain Score: 3  Pain Type: Acute pain Pain Location: Leg Pain Orientation: Left, Right Pain Descriptors / Indicators: Burning Pain Onset: Today Pain Frequency: Intermittent Pain Relieving Factors: Pain medication  Pain Relieving Factors: Pain medication  Current Medications & Allergies (verified) Allergies as of 01/17/2022       Reactions   Meperidine Nausea Only, Nausea And Vomiting   Meperidine And Related Nausea And Vomiting   Oxycodone    loopy   Percocet [oxycodone-acetaminophen]    Feels loopy   Demerol Nausea And Vomiting        Medication List        Accurate as of January 17, 2022  3:14 PM. If you have any questions, ask your nurse or doctor.          acetaminophen 500 MG tablet Commonly known as: TYLENOL Take 325 mg by mouth every 6 (six) hours as needed for moderate pain or headache.   albuterol 108 (90 Base) MCG/ACT inhaler Commonly known as: VENTOLIN HFA INHALE TWO PUFFS INTO THE LUNGS EVERY 6 HOURS AS NEEDED FOR WHEEZING OR SHORTNESS OF BREATH   Arnuity Ellipta 100 MCG/ACT Aepb Generic drug: Fluticasone Furoate Inhale 1 puff into the lungs daily.   ascorbic acid 500 MG tablet Commonly known as: VITAMIN C Take 500 mg by mouth 2 (two) times daily.   atorvastatin 40  MG tablet Commonly known as: LIPITOR Take 1 tablet (40 mg total) by mouth at bedtime.   Biotin 1000 MCG tablet Take 1,000 mcg by mouth daily.   clotrimazole-betamethasone cream Commonly known as: Lotrisone Apply 1 application topically 2 (two) times daily. What changed:  when to take this reasons to take this   CoQ-10 100 MG Caps Take 100 mg by mouth daily.   cyanocobalamin 1000 MCG tablet Commonly known as: VITAMIN B12 Take 1,000 mcg by  mouth daily.   fluticasone 50 MCG/ACT nasal spray Commonly known as: FLONASE USE 1 SPRAY IN BOTH  NOSTRILS TWICE DAILY   furosemide 40 MG tablet Commonly known as: LASIX Take 2 tablets (80 mg total) by mouth daily.   ketoconazole 2 % cream Commonly known as: NIZORAL Apply topically daily as needed for irritation. What changed: how much to take   levothyroxine 125 MCG tablet Commonly known as: SYNTHROID TAKE 1 TABLET BY MOUTH DAILY  BEFORE BREAKFAST   loratadine 10 MG tablet Commonly known as: CLARITIN Take 10 mg by mouth at bedtime.   Magnesium Citrate 200 MG Tabs Take 200 mg by mouth daily.   meloxicam 15 MG tablet Commonly known as: MOBIC TAKE 1 TABLET BY MOUTH  DAILY   metoprolol succinate 50 MG 24 hr tablet Commonly known as: TOPROL-XL TAKE 2 TABLETS BY MOUTH DAILY  WITH OR IMMEDIATELY FOLLOWING A  MEAL   nystatin powder Commonly known as: MYCOSTATIN/NYSTOP Apply 1 Application topically 3 (three) times daily as needed (redness/itch on affected skin).   potassium chloride 10 MEQ tablet Commonly known as: KLOR-CON TAKE 1 TABLET BY MOUTH  DAILY AS NEEDED   spironolactone 25 MG tablet Commonly known as: ALDACTONE Take 1 tablet (25 mg total) by mouth daily.   traMADol 50 MG tablet Commonly known as: ULTRAM TAKE ONE TABLET BY MOUTH THREE TIMES DAILY AS NEEDED   triamcinolone ointment 0.5 % Commonly known as: KENALOG Apply 1 Application topically 2 (two) times daily as needed (itch or burn).   TURMERIC PO Take 1,500 mg by mouth daily.   valsartan 320 MG tablet Commonly known as: DIOVAN Take 1 tablet (320 mg total) by mouth daily.   Vitamin D 125 MCG (5000 UT) Caps Take 5,000 Units by mouth daily. With K2   Zinc 50 MG Caps Take 50 mg by mouth daily.        History (reviewed): Past Medical History:  Diagnosis Date   Arthritis    neck, back, hips, knees, ankles   Asthma    use everyday   Atypical mole 12/03/2017   Left Lower Shin (mild)    Atypical mole 05/21/2018   Left Deltoid (mild)   Atypical mole 05/21/2018   Left Tricep (moderate to severe)   Cancer ([place]) 2019   skin   Dyspnea    Dysrhythmia    PVCs , Afib   Heart murmur    History of kidney stones    Hyperlipidemia    Hypertension    Hyperthyroidism 08/08/2017   radio active iodine x2   Hypothyroidism    now take synthroid   Lymphedema 12/03/2021   Paroxysmal atrial fibrillation ([place]) 08/09/2015   PONV (postoperative nausea and vomiting)    Superficial basal cell carcinoma (BCC) 12/03/2017   Right Outer Abdomen Mid (curet and 5FU)   Past Surgical History:  Procedure Laterality Date   CARDIOVERSION N/A 08/05/2016   Procedure: CARDIOVERSION;  Surgeon: Skeet Latch, MD;  Location: [facility];  Service: Cardiovascular;  Laterality: N/A;  CATARACT EXTRACTION Left 09/2012   Dr. Bunnie Philips   LITHOTRIPSY     REPLACEMENT TOTAL KNEE BILATERAL  2011   REVERSE SHOULDER ARTHROPLASTY Left 07/19/2020   Procedure: REVERSE SHOULDER ARTHROPLASTY;  Surgeon: Justice Britain, MD;  Location: [facility];  Service: Orthopedics;  Laterality: Left;  117mn   REVERSE SHOULDER ARTHROPLASTY Right 12/06/2020   Procedure: REVERSE SHOULDER ARTHROPLASTY;  Surgeon: SJustice Britain MD;  Location: [facility];  Service: Orthopedics;  Laterality: Right;  122m   THYROIDECTOMY  1971   TUBAL LIGATION  1979   Family History  Problem Relation Age of Onset   Coronary artery disease Mother 6522     deceased   Depression Mother    Hyperlipidemia Mother    Diabetes Paternal Grandmother    Thyroid disease Sister    COPD Father    Breast cancer Neg Hx    Social History   Socioeconomic History   Marital status: Married    Spouse name: StRemo Lipps Number of children: 2   Years of education: 16   Highest education level: Bachelor's degree (e.g., BA, AB, BS)  Occupational History   Occupation: Retired RNTherapist, sports  Tobacco Use   Smoking status: Never   Smokeless tobacco: Never  Vaping Use   Vaping  Use: Never used  Substance and Sexual Activity   Alcohol use: No   Drug use: No   Sexual activity: Not Currently  Other Topics Concern   Not on file  Social History Narrative   Lives with her husband. She enjoys singing.   Social Determinants of Health   Financial Resource Strain: Low Risk  (01/17/2022)   Overall Financial Resource Strain (CARDIA)    Difficulty of Paying Living Expenses: Not hard at all  Food Insecurity: No Food Insecurity (01/17/2022)   Hunger Vital Sign    Worried About Running Out of Food in the Last Year: Never true    Ran Out of Food in the Last Year: Never true  Transportation Needs: No Transportation Needs (01/17/2022)   PRAPARE - TrHydrologistMedical): No    Lack of Transportation (Non-Medical): No  Physical Activity: Inactive (01/17/2022)   Exercise Vital Sign    Days of Exercise per Week: 0 days    Minutes of Exercise per Session: 0 min  Stress: No Stress Concern Present (01/17/2022)   [place]  Feeling of Stress : Not at all  Social Connections: Moderately Integrated (01/17/2022)   Social Connection and Isolation Panel [NHANES]    Frequency of Communication with Friends and Family: Three times a week    Frequency of Social Gatherings with Friends and Family: Once a week    Attends Religious Services: More than 4 times per year    Active Member of [employer] Organizations: No    Attends ClArchivisteetings: Never    Marital Status: Married    Activities of Daily Living    01/17/2022    3:01 PM  In your present state of health, do you have any difficulty performing the following activities:  Hearing? 1  Comment some hearing loss  Vision? 0  Difficulty concentrating or making decisions? 1  Comment some difficulty concentrating.  Walking or climbing stairs? 1  Comment due to bilateral lymphedema in lower extremities.  Dressing or  bathing? 0  Doing errands, shopping? 0  Preparing Food and eating ? N  Comment her husband cooks.  Using the Toilet? N  In the past six months, have you accidently leaked urine? Y  Comment some stress incontinence.  Do you have problems with loss of bowel control? N  Managing your Medications? N  Managing your Finances? N  Housekeeping or managing your Housekeeping? N    Patient Education/ Literacy How often do you need to have someone help you when you read instructions, pamphlets, or other written materials from your doctor or pharmacy?: 1 - Never What is the last grade level you completed in school?: 3 year of Nursing school  Exercise Current Exercise Habits: The patient does not participate in regular exercise at present, Exercise limited by: Other - see comments;cardiac condition(s) (bilateral lymphedema in both legs)  Diet Patient reports consuming 2 meals a day and 1 snack(s) a day Patient reports that her primary diet is: Regular Patient reports that she does have regular access to food.   Depression Screen    01/17/2022    3:01 PM 06/20/2021   10:15 AM 04/05/2021    3:45 PM 10/03/2020   11:03 AM 05/28/2020   11:19 AM 04/19/2019    3:01 PM 12/29/2018    9:18 AM  PHQ 2/9 Scores  PHQ - 2 Score 0 0 0 0 0 0 0     Fall Risk    01/17/2022    3:01 PM 06/20/2021   10:15 AM 04/05/2021    3:45 PM 10/03/2020   11:03 AM 05/28/2020   11:19 AM  Fall Risk   Falls in the past year? 0 0 0 0 0  Number falls in past yr: 0 0 0 0   Injury with Fall? 0 0 0 0   Risk for fall due to : No Fall Risks No Fall Risks No Fall Risks No Fall Risks No Fall Risks  Follow up Falls evaluation completed Falls prevention discussed Falls prevention discussed;Falls evaluation completed Falls prevention discussed;Falls evaluation completed      Objective:  Martha Perkins seemed alert and oriented and she participated appropriately during our telephone visit.  Blood Pressure Weight BMI  BP Readings  from Last 3 Encounters:  12/23/21 121/85  12/03/21 130/72  07/30/21 (!) 116/53   Wt Readings from Last 3 Encounters:  12/23/21 223 lb (101.2 kg)  12/03/21 224 lb (101.6 kg)  09/23/21 218 lb (98.9 kg)   BMI Readings from Last 1 Encounters:  12/23/21 43.55 kg/m    *Unable to obtain current vital signs, weight, and BMI due to telephone visit type  Hearing/Vision  [place] did not seem to have difficulty with hearing/understanding during the telephone conversation Reports that she has not had a formal eye exam by an eye care professional within the past year Reports that she has not had a formal hearing evaluation within the past year *Unable to fully assess hearing and vision during telephone visit type  Cognitive Function:    01/17/2022    3:08 PM 04/19/2019    3:05 PM 01/15/2017   11:07 AM 01/11/2016   11:05 AM  6CIT Screen  What Year? 0 points 0 points 0 points 0 points  What month? 0 points 0 points 0 points 0 points  What time? 0 points 0 points 0 points 0 points  Count back from 20 0 points 0 points 0 points 0 points  Months in reverse 0 points 0 points 0 points 0 points  Repeat phrase 0 points 0 points 2 points 0 points  Total Score 0 points 0 points 2 points  0 points   (Normal:0-7, Significant for Dysfunction: >8)  Normal Cognitive Function Screening: Yes   Immunization & Health Maintenance Record Immunization History  Administered Date(s) Administered   Fluad Quad(high Dose 65+) 12/29/2018, 12/26/2019   Influenza Split 01/27/2012, 01/01/2017   Influenza, High Dose Seasonal PF 01/29/2018, 12/23/2021   Influenza,inj,Quad PF,6+ Mos 11/26/2012, 12/29/2013, 12/14/2014, 11/13/2015   Moderna Sars-Covid-2 Vaccination 06/14/2019, 07/12/2019, 01/24/2020   PNEUMOCOCCAL CONJUGATE-20 06/20/2021   PPD Test 05/18/2018   Pneumococcal Conjugate-13 12/29/2013   Pneumococcal Polysaccharide-23 11/26/2012   Tdap 07/02/2015   Zoster Recombinat (Shingrix) 06/11/2020, 09/19/2020     Health Maintenance  Topic Date Due   COVID-19 Vaccine (4 - Moderna risk series) 07/07/2022 (Originally 03/20/2020)   Medicare Annual Wellness (AWV)  01/18/2023   MAMMOGRAM  08/16/2023   COLONOSCOPY (Pts 45-18yr Insurance coverage will need to be confirmed)  02/19/2025   TETANUS/TDAP  07/01/2025   Pneumonia Vaccine 76 Years old  Completed   INFLUENZA VACCINE  Completed   DEXA SCAN  Completed   Hepatitis C Screening  Completed   Zoster Vaccines- Shingrix  Completed   HPV VACCINES  Aged Out       Assessment  This is a routine wellness examination for [employer]  Health Maintenance: Due or Overdue There are no preventive care reminders to display for this patient.   KVonda Antiguadoes not need a referral for Community Assistance: Care Management:   no Social Work:    no Prescription Assistance:  no Nutrition/Diabetes Education:  no   Plan:  Personalized Goals  Goals Addressed               This Visit's Progress     Patient Stated (pt-stated)        Patient would like to loose 60 lbs.       Personalized Health Maintenance & Screening Recommendations  Bone densitometry screening  Lung Cancer Screening Recommended: no (Low Dose CT Chest recommended if Age 74-80years, 30 pack-year currently smoking OR have quit w/in past 15 years) Hepatitis C Screening recommended: no HIV Screening recommended: no  Advanced Directives: Written information was not prepared per patient's request.  Referrals & Orders Orders Placed This Encounter  Procedures   [place]   Follow-up Plan Follow-up with MHali Marry MD as planned Medicare wellness visit in one year.  Patient will access AVS on my chart.   I have personally reviewed and noted the following in the patient's chart:   Medical and social history Use of alcohol, tobacco or illicit drugs  Current medications and supplements Functional ability and status Nutritional status Physical  activity Advanced directives List of other physicians Hospitalizations, surgeries, and ER visits in previous 12 months Vitals Screenings to include cognitive, depression, and falls Referrals and appointments  In addition, I have reviewed and discussed with KVonda Antiguacertain preventive protocols, quality metrics, and best practice recommendations. A written personalized care plan for preventive services as well as general preventive health recommendations is available and can be mailed to the patient at her request.      BTinnie Gens RN BSN  01/17/2022

## 2022-01-20 NOTE — Telephone Encounter (Signed)
Pld call pt and see how she is doing.

## 2022-01-21 NOTE — Telephone Encounter (Signed)
Attempted call to patient. Left voice mail requesting a return call.

## 2022-01-22 ENCOUNTER — Encounter: Payer: Self-pay | Admitting: Family Medicine

## 2022-01-22 NOTE — Telephone Encounter (Signed)
Attempted call again. Phone disconnected . Could not leave a vm message.

## 2022-01-22 NOTE — Telephone Encounter (Signed)
Attempted a call again . Was able to leave a voice mail message requesting a return call.

## 2022-01-27 NOTE — Telephone Encounter (Signed)
Note from patient to prescriber in [facility] from January 22, 2022.

## 2022-01-28 ENCOUNTER — Other Ambulatory Visit: Payer: Self-pay | Admitting: Family Medicine

## 2022-01-28 DIAGNOSIS — M7989 Other specified soft tissue disorders: Secondary | ICD-10-CM

## 2022-01-28 NOTE — Telephone Encounter (Signed)
Pls call patient.  We received a request for refill on her 20 mg Lasix but she is now taking 40 mg.  Is she needing a new prescription sent to [facility]?

## 2022-02-04 NOTE — Telephone Encounter (Signed)
Patient states she did not request the refill.

## 2022-02-07 ENCOUNTER — Other Ambulatory Visit: Payer: Self-pay | Admitting: Family Medicine

## 2022-02-07 DIAGNOSIS — M5136 Other intervertebral disc degeneration, lumbar region: Secondary | ICD-10-CM

## 2022-02-07 DIAGNOSIS — G8929 Other chronic pain: Secondary | ICD-10-CM

## 2022-02-26 DIAGNOSIS — I890 Lymphedema, not elsewhere classified: Secondary | ICD-10-CM | POA: Diagnosis not present

## 2022-03-07 DIAGNOSIS — I890 Lymphedema, not elsewhere classified: Secondary | ICD-10-CM | POA: Diagnosis not present

## 2022-03-13 ENCOUNTER — Encounter: Payer: Self-pay | Admitting: Family Medicine

## 2022-03-13 ENCOUNTER — Ambulatory Visit (INDEPENDENT_AMBULATORY_CARE_PROVIDER_SITE_OTHER): Payer: Medicare Other | Admitting: Family Medicine

## 2022-03-13 VITALS — BP 135/62 | HR 77 | Ht 60.0 in | Wt 216.0 lb

## 2022-03-13 DIAGNOSIS — I890 Lymphedema, not elsewhere classified: Secondary | ICD-10-CM

## 2022-03-13 DIAGNOSIS — I872 Venous insufficiency (chronic) (peripheral): Secondary | ICD-10-CM

## 2022-03-13 DIAGNOSIS — G8929 Other chronic pain: Secondary | ICD-10-CM

## 2022-03-13 NOTE — Progress Notes (Signed)
Pt states that she noticed a red area on the front lower part of her R leg a few weeks ago that she wanted to have checked and she said that it feels like there are times when it feels like there is "fluid" running down her legs.

## 2022-03-13 NOTE — Progress Notes (Signed)
   Established Patient Office Visit  Subjective   Patient ID: Martha Perkins, female    DOB: [birth year]  Age: 77 y.o. MRN: [id number]  Chief Complaint  Patient presents with   Edema    HPI Reports inc LE edema.  Weight is actually down 4 lbs since last here she was noticing some wetness on her pants and shoes so was worried that she was getting some leaking in her legs again.  She has been trying to wear the compression wrap some but not consistently.  She usually has to get her husband to help her and then that just ends up getting very frustrating.  She actually did get them on the other day and actually wore them for about 8 hours but then felt like it was actually rubbing the top of her right leg and so now she noticed a little bit of redness right below her knee.  She would like me to check her left ear. She feels ike something is in the canal but she is been hesitant to try to clear it out or stick anything in there.  She says that the eardrum that she has a tube in.  Wanted let me know that she passed a kidney stone last Friday.  She is feeling much better.      ROS    Objective:     BP 135/62   Pulse 77   Ht 5' (1.524 m)   Wt 216 lb (98 kg)   SpO2 98%   BMI 42.18 kg/m    Physical Exam   No results found for any visits on 03/13/22.    The 10-year ASCVD risk score (Arnett DK, et al., 2019) is: 22%    Assessment & Plan:   Problem List Items Addressed This Visit       Cardiovascular and Mediastinum   Venous insufficiency of both lower extremities - Primary    He has a combination of lymphedema and venous stasis.  Right now I do not see any sign of cellulitis just encouraged her to try to wear her compression wraps as much as she can.  We also discussed the importance of weight loss and really reducing her chronic swelling discussed how it could really make a big difference in how often she is having flares with her legs.  She agrees and is planning on really  working with it.  She and her husband have both been trying to eat more healthy.  In fact she is down 4 pounds since when she was last here.        Other   Lymphedema    Same.       Encounter for chronic pain management    For osteoarthritis and lumbar degenerative disc disease.  We just refilled her tramadol about 4 weeks ago.  She takes up to 3 times a day as needed.       Return in about 6 months (around 09/11/2022), or if symptoms worsen or fail to improve.    Beatrice Lecher, MD

## 2022-03-14 NOTE — Assessment & Plan Note (Signed)
Same

## 2022-03-14 NOTE — Assessment & Plan Note (Signed)
For osteoarthritis and lumbar degenerative disc disease.  We just refilled her tramadol about 4 weeks ago.  She takes up to 3 times a day as needed.

## 2022-03-14 NOTE — Assessment & Plan Note (Signed)
He has a combination of lymphedema and venous stasis.  Right now I do not see any sign of cellulitis just encouraged her to try to wear her compression wraps as much as she can.  We also discussed the importance of weight loss and really reducing her chronic swelling discussed how it could really make a big difference in how often she is having flares with her legs.  She agrees and is planning on really working with it.  She and her husband have both been trying to eat more healthy.  In fact she is down 4 pounds since when she was last here.

## 2022-03-17 ENCOUNTER — Other Ambulatory Visit: Payer: Self-pay | Admitting: Family Medicine

## 2022-03-17 DIAGNOSIS — M5136 Other intervertebral disc degeneration, lumbar region: Secondary | ICD-10-CM

## 2022-03-17 DIAGNOSIS — G8929 Other chronic pain: Secondary | ICD-10-CM

## 2022-03-20 DIAGNOSIS — K760 Fatty (change of) liver, not elsewhere classified: Secondary | ICD-10-CM | POA: Diagnosis not present

## 2022-03-20 DIAGNOSIS — R748 Abnormal levels of other serum enzymes: Secondary | ICD-10-CM | POA: Diagnosis not present

## 2022-03-21 ENCOUNTER — Other Ambulatory Visit: Payer: Self-pay | Admitting: Family Medicine

## 2022-04-14 ENCOUNTER — Other Ambulatory Visit: Payer: Self-pay | Admitting: Family Medicine

## 2022-04-14 DIAGNOSIS — M5136 Other intervertebral disc degeneration, lumbar region: Secondary | ICD-10-CM

## 2022-04-14 DIAGNOSIS — I10 Essential (primary) hypertension: Secondary | ICD-10-CM

## 2022-04-14 DIAGNOSIS — G8929 Other chronic pain: Secondary | ICD-10-CM

## 2022-04-14 DIAGNOSIS — M51369 Other intervertebral disc degeneration, lumbar region without mention of lumbar back pain or lower extremity pain: Secondary | ICD-10-CM

## 2022-05-01 DIAGNOSIS — R748 Abnormal levels of other serum enzymes: Secondary | ICD-10-CM | POA: Diagnosis not present

## 2022-05-01 DIAGNOSIS — R932 Abnormal findings on diagnostic imaging of liver and biliary tract: Secondary | ICD-10-CM | POA: Diagnosis not present

## 2022-05-01 DIAGNOSIS — K760 Fatty (change of) liver, not elsewhere classified: Secondary | ICD-10-CM | POA: Diagnosis not present

## 2022-05-01 DIAGNOSIS — K5900 Constipation, unspecified: Secondary | ICD-10-CM | POA: Diagnosis not present

## 2022-05-07 ENCOUNTER — Other Ambulatory Visit: Payer: Self-pay | Admitting: Family Medicine

## 2022-05-27 ENCOUNTER — Other Ambulatory Visit: Payer: Self-pay | Admitting: Family Medicine

## 2022-05-27 DIAGNOSIS — M5136 Other intervertebral disc degeneration, lumbar region: Secondary | ICD-10-CM

## 2022-05-27 DIAGNOSIS — G8929 Other chronic pain: Secondary | ICD-10-CM

## 2022-05-29 ENCOUNTER — Other Ambulatory Visit: Payer: Self-pay | Admitting: Family Medicine

## 2022-05-29 DIAGNOSIS — I10 Essential (primary) hypertension: Secondary | ICD-10-CM

## 2022-05-29 DIAGNOSIS — E890 Postprocedural hypothyroidism: Secondary | ICD-10-CM

## 2022-06-11 ENCOUNTER — Telehealth: Payer: Self-pay | Admitting: Family Medicine

## 2022-06-11 NOTE — Telephone Encounter (Signed)
Patient is requesting a new referral be sent to [facility] for her Lymphedema. Please advise.    Occupational Therapy - [facility]  [address]  [place], [place] [address]  [phone]

## 2022-06-11 NOTE — Telephone Encounter (Signed)
New referral entered. Referral, clinical notes and copies of insurance cards have been faxed to [facility] at [phone].

## 2022-06-11 NOTE — Telephone Encounter (Signed)
Ok to place new referral 

## 2022-06-23 ENCOUNTER — Telehealth: Payer: Self-pay | Admitting: Family Medicine

## 2022-06-23 DIAGNOSIS — I872 Venous insufficiency (chronic) (peripheral): Secondary | ICD-10-CM | POA: Diagnosis not present

## 2022-06-23 DIAGNOSIS — I890 Lymphedema, not elsewhere classified: Secondary | ICD-10-CM | POA: Diagnosis not present

## 2022-06-23 NOTE — Telephone Encounter (Signed)
Pt called with questions about therapy for her lymphedema. Patient was recently told there was a therapist in [place] through [facility] and would like to know if that is an option for her as it is closer to home.   If you need to contact the patient it is best to call her at [phone] due to her internet being down.

## 2022-06-23 NOTE — Telephone Encounter (Signed)
Not sure.  Mickie Bail could you check into this and see if there is a location in [place] through [facility] that does lymphedema treatments

## 2022-06-24 NOTE — Telephone Encounter (Signed)
Mychart sent.

## 2022-06-24 NOTE — Telephone Encounter (Signed)
I was searching earlier for another patient who needed lymphedema treatments. I found the following;  [facility]) [facility] [address] [place], [place] [address] (300 feet north of the [employer] [place]) Ph: [phone]  [facility] - Only if cancer related [address] [place], [place] [address] Ph: [phone]   [facility] Ph: [phone] Fax: [phone]  [facility] ( Has wait list, currently 52 pts on it) [place], [place] Ph: [phone]  [facility] [place], [place]  Ph: [phone]

## 2022-06-27 ENCOUNTER — Other Ambulatory Visit: Payer: Self-pay | Admitting: Obstetrics and Gynecology

## 2022-06-27 ENCOUNTER — Other Ambulatory Visit: Payer: Self-pay | Admitting: Family Medicine

## 2022-06-27 DIAGNOSIS — G8929 Other chronic pain: Secondary | ICD-10-CM

## 2022-06-27 DIAGNOSIS — N9089 Other specified noninflammatory disorders of vulva and perineum: Secondary | ICD-10-CM

## 2022-06-27 DIAGNOSIS — M5136 Other intervertebral disc degeneration, lumbar region: Secondary | ICD-10-CM

## 2022-07-01 ENCOUNTER — Encounter: Payer: Self-pay | Admitting: Family Medicine

## 2022-07-01 ENCOUNTER — Ambulatory Visit (INDEPENDENT_AMBULATORY_CARE_PROVIDER_SITE_OTHER): Payer: Medicare Other | Admitting: Family Medicine

## 2022-07-01 VITALS — BP 103/62 | HR 73 | Ht 60.0 in | Wt 218.0 lb

## 2022-07-01 DIAGNOSIS — M5416 Radiculopathy, lumbar region: Secondary | ICD-10-CM

## 2022-07-01 MED ORDER — CYCLOBENZAPRINE HCL 10 MG PO TABS: 10.0000 mg | ORAL_TABLET | Freq: Three times a day (TID) | ORAL | 0 refills | Status: DC | PRN

## 2022-07-01 MED ORDER — PREDNISONE 50 MG PO TABS: ORAL_TABLET | ORAL | 0 refills | Status: DC

## 2022-07-01 NOTE — Patient Instructions (Signed)
Degenerative Disk Disease ? ?Degenerative disk disease is a condition caused by changes that occur in the spinal disks as a person ages. Spinal disks are soft and compressible disks located between the bones of your spine (vertebrae). These disks act like shock absorbers. ?Degenerative disk disease can affect the whole spine. However, the neck and lower back are most often affected. Many changes can occur in the spinal disks with aging, such as: ?The spinal disks may dry and shrink. ?Small tears may occur in the tough, outer covering of the disk (annulus). ?The disk space may become smaller due to loss of water. ?Abnormal growths in the bone (spurs) may occur. This can put pressure on the nerve roots exiting the spinal canal, causing pain. ?The spinal canal may become narrowed. ?What are the causes? ?This condition may be caused by: ?Normal degeneration with age. ?Injuries. ?Certain activities and sports that cause damage. ?What increases the risk? ?The following factors may make you more likely to develop this condition: ?Being overweight. ?Having a family history of degenerative disk disease. ?Smoking and use of products that contain nicotine and tobacco. ?Sudden injury. ?Doing work that requires heavy lifting. ?What are the signs or symptoms? ?Symptoms of this condition include: ?Pain that varies in intensity. Some people have no pain, while others have severe pain. The location of the pain depends on the part of your backbone that is affected. You may have: ?Pain in your neck or arm if a disk in your neck area is affected. ?Pain in your back, buttocks, or legs if a disk in your lower back is affected. ?Pain that becomes worse while bending or reaching up, or with twisting movements. ?Pain that may start gradually and worsen as time passes. It may also start after a major or minor injury. ?Numbness or tingling in the arms or legs. ?How is this diagnosed? ?This condition may be diagnosed based on: ?Your symptoms  and medical history. ?A physical exam. ?Imaging tests, including: ?X-ray of the spine. ?CT scan. ?MRI. ?How is this treated? ?This condition may be treated with: ?Medicines. ?Injection of steroids into the back. ?Rehabilitation exercises. These activities aim to strengthen muscles in your back and abdomen to better support your spine. ?If treatments do not help to relieve your symptoms or you have severe pain, you may need surgery. ?Follow these instructions at home: ?Medicines ?Take over-the-counter and prescription medicines only as told by your health care provider. ?Ask your health care provider if the medicine prescribed to you: ?Requires you to avoid driving or using machinery. ?Can cause constipation. You may need to take these actions to prevent or treat constipation: ?Drink enough fluid to keep your urine pale yellow. ?Take over-the-counter or prescription medicines. ?Eat foods that are high in fiber, such as beans, whole grains, and fresh fruits and vegetables. ?Limit foods that are high in fat and processed sugars, such as fried or sweet foods. ?Activity ?Rest as told by your health care provider. ?Avoid sitting for a long time without moving. Get up to take short walks every 1-2 hours. This is important to improve blood flow and breathing. Ask for help if you feel weak or unsteady. ?Return to your normal activities as told by your health care provider. Ask your health care provider what activities are safe for you. ?Perform relaxation exercises as told by your health care provider. ?Maintain good posture. ?Do not lift anything that is heavier than 10 lb (4.5 kg), or the limit that you are told, until your health   care provider says that it is safe. ?Follow proper lifting and walking techniques as told by your health care provider. ?Managing pain, stiffness, and swelling ? ?  ? ?If directed, put ice on the painful area. Icing can help to relieve pain. To do this: ?Put ice in a plastic bag. ?Place a towel  between your skin and the bag. ?Leave the ice on for 20 minutes, 2-3 times a day. ?Remove the ice if your skin turns bright red. This is very important. If you cannot feel pain, heat, or cold, you have a greater risk of damage to the area. ?If directed, apply heat to the painful area as often as told by your health care provider. Heat can reduce the stiffness of your muscles. Use the heat source that your health care provider recommends, such as a moist heat pack or a heating pad. ?Place a towel between your skin and the heat source. ?Leave the heat on for 20-30 minutes. ?Remove the heat if your skin turns bright red. This is especially important if you are unable to feel pain, heat, or cold. You may have a greater risk of getting burned. ?General instructions ?Change your sitting, standing, and sleeping habits as told by your health care provider. ?Avoid sitting in the same position for long periods of time. Change positions frequently. ?Lose weight or maintain a healthy weight as told by your health care provider. ?Do not use any products that contain nicotine or tobacco, such as cigarettes, e-cigarettes, and chewing tobacco. If you need help quitting, ask your health care provider. ?Wear supportive footwear. ?Keep all follow-up visits. This is important. This may include visits for physical therapy. ?Contact a health care provider if you: ?Have pain that does not go away within 1-4 weeks. ?Lose your appetite. ?Lose weight without trying. ?Get help right away if you: ?Have severe pain. ?Notice weakness in your arms, hands, or legs. ?Begin to lose control of your bladder or bowel movements. ?Have fevers or night sweats. ?Summary ?Degenerative disk disease is a condition caused by changes that occur in the spinal disks as a person ages. ?This condition can affect the whole spine. However, the neck and lower back are most often affected. ?Take over-the-counter and prescription medicines only as told by your health  care provider. ?This information is not intended to replace advice given to you by your health care provider. Make sure you discuss any questions you have with your health care provider. ?Document Revised: 06/09/2019 Document Reviewed: 06/09/2019 ?Elsevier Patient Education ? 2023 Elsevier Inc. ? ?

## 2022-07-01 NOTE — Progress Notes (Signed)
Martha Perkins - 77 y.o. female MRN [id number]  Date of birth: 02/22/1946  Subjective Chief Complaint  Patient presents with   Leg Pain    HPI Martha Perkins is a 77 year old female here today with complaint of left leg pain as well as left-sided lower back pain.  She reports that symptoms began couple days ago.  Reports that she got up in the middle of the night and when she was going back to bed began having pain.  She has weakness in the left leg.  Denies numbness or tingling.  No bowel or bladder incontinence.  She has tried Tylenol and tramadol without much relief.  ROS:  A comprehensive ROS was completed and negative except as noted per HPI  Allergies  Allergen Reactions   Meperidine Nausea Only and Nausea And Vomiting   Meperidine And Related Nausea And Vomiting   Oxycodone     loopy   Percocet [Oxycodone-Acetaminophen]     Feels loopy   Demerol Nausea And Vomiting    Past Medical History:  Diagnosis Date   Arthritis    neck, back, hips, knees, ankles   Asthma    use everyday   Atypical mole 12/03/2017   Left Lower Shin (mild)   Atypical mole 05/21/2018   Left Deltoid (mild)   Atypical mole 05/21/2018   Left Tricep (moderate to severe)   Cancer 2019   skin   Dyspnea    Dysrhythmia    PVCs , Afib   Heart murmur    History of kidney stones    Hyperlipidemia    Hypertension    Hyperthyroidism 08/08/2017   radio active iodine x2   Hypothyroidism    now take synthroid   Lymphedema 12/03/2021   Paroxysmal atrial fibrillation 08/09/2015   PONV (postoperative nausea and vomiting)    Superficial basal cell carcinoma (BCC) 12/03/2017   Right Outer Abdomen Mid (curet and 5FU)    Past Surgical History:  Procedure Laterality Date   CARDIOVERSION N/A 08/05/2016   Procedure: CARDIOVERSION;  Surgeon: Chilton Si, MD;  Location: [facility] ENDOSCOPY;  Service: Cardiovascular;  Laterality: N/A;   CATARACT EXTRACTION Left 09/2012   Dr. Darcus Pester   LITHOTRIPSY      REPLACEMENT TOTAL KNEE BILATERAL  2011   REVERSE SHOULDER ARTHROPLASTY Left 07/19/2020   Procedure: REVERSE SHOULDER ARTHROPLASTY;  Surgeon: Francena Hanly, MD;  Location: [facility] ORS;  Service: Orthopedics;  Laterality: Left;    REVERSE SHOULDER ARTHROPLASTY Right 12/06/2020   Procedure: REVERSE SHOULDER ARTHROPLASTY;  Surgeon: Francena Hanly, MD;  Location: [facility] ORS;  Service: Orthopedics;  Laterality: Right;    THYROIDECTOMY  1971   TUBAL LIGATION  1979    Social History   Socioeconomic History   Marital status: Married    Spouse name: Viviann Spare   Number of children: 2   Years of education: 16   Highest education level: Bachelor's degree (e.g., BA, AB, BS)  Occupational History   Occupation: Retired Charity fundraiser.   Tobacco Use   Smoking status: Never   Smokeless tobacco: Never  Vaping Use   Vaping Use: Never used  Substance and Sexual Activity   Alcohol use: No   Drug use: No   Sexual activity: Not Currently  Other Topics Concern   Not on file  Social History Narrative   Lives with her husband. She enjoys singing.   Social Determinants of Health   Financial Resource Strain: Low Risk  (01/17/2022)   Overall Financial Resource Strain (CARDIA)  Difficulty of Paying Living Expenses: Not hard at all  Food Insecurity: No Food Insecurity (01/17/2022)   Hunger Vital Sign    Worried About Running Out of Food in the Last Year: Never true    Ran Out of Food in the Last Year: Never true  Transportation Needs: No Transportation Needs (01/17/2022)   PRAPARE - Administrator, Civil Service (Medical): No    Lack of Transportation (Non-Medical): No  Physical Activity: Inactive (01/17/2022)   Exercise Vital Sign    Days of Exercise per Week: 0 days    Minutes of Exercise per Session: 0 min  Stress: No Stress Concern Present (01/17/2022)   [employer] of Occupational Health - Occupational Stress Questionnaire    Feeling of Stress : Not at all  Social Connections:  Moderately Integrated (01/17/2022)   Social Connection and Isolation Panel [NHANES]    Frequency of Communication with Friends and Family: Three times a week    Frequency of Social Gatherings with Friends and Family: Once a week    Attends Religious Services: More than 4 times per year    Active Member of Clubs or Organizations: No    Attends Banker Meetings: Never    Marital Status: Married    Family History  Problem Relation Age of Onset   Coronary artery disease Mother 68       deceased   Depression Mother    Hyperlipidemia Mother    Diabetes Paternal Grandmother    Thyroid disease Sister    COPD Father    Breast cancer Neg Hx     Health Maintenance  Topic Date Due   COVID-19 Vaccine (4 - 2023-24 season) 12/17/2022 (Originally 11/08/2021)   INFLUENZA VACCINE  10/09/2022   Medicare Annual Wellness (AWV)  01/18/2023   MAMMOGRAM  08/16/2023   COLONOSCOPY (Pts 45-38yrs Insurance coverage will need to be confirmed)  02/19/2025   DTaP/Tdap/Td (2 - Td or Tdap) 07/01/2025   Pneumonia Vaccine 73+ Years old  Completed   DEXA SCAN  Completed   Hepatitis C Screening  Completed   Zoster Vaccines- Shingrix  Completed   HPV VACCINES  Aged Out     ----------------------------------------------------------------------------------------------------------------------------------------------------------------------------------------------------------------- Physical Exam BP 103/62 (BP Location: Left Arm, Patient Position: Sitting, Cuff Size: Large)   Pulse 73   Ht 5' (1.524 m)   Wt 218 lb (98.9 kg)   SpO2 96%   BMI 42.58 kg/m   Physical Exam Constitutional:      Appearance: Normal appearance.  HENT:     Head: Normocephalic and atraumatic.  Musculoskeletal:     Comments: mild tenderness palpation along the left lower lumbar paraspinals.  Mild left lower extremity weakness.  Increased pain with weightbearing and walking.  She is using a walker to help with ambulation.   Neurological:     Mental Status: She is alert.     ------------------------------------------------------------------------------------------------------------------------------------------------------------------------------------------------------------------- Assessment and Plan  Acute lumbar radiculopathy Offered injection of Toradol which she declined today.  Adding burst of prednisone with Flexeril.  Referral made to physical therapy.  Red flag symptoms reviewed.   Meds ordered this encounter  Medications   predniSONE (DELTASONE) 50 MG tablet    Sig: Take  daily x5 days.    Dispense:  5 tablet    Refill:  0   cyclobenzaprine (FLEXERIL) 10 MG tablet    Sig: Take 1 tablet (10 mg total) by mouth 3 (three) times daily as needed for muscle spasms.    Dispense:  30  tablet    Refill:  0    No follow-ups on file.    This visit occurred during the SARS-CoV-2 public health emergency.  Safety protocols were in place, including screening questions prior to the visit, additional usage of staff PPE, and extensive cleaning of exam room while observing appropriate contact time as indicated for disinfecting solutions.

## 2022-07-01 NOTE — Assessment & Plan Note (Addendum)
Offered injection of Toradol which she declined today.  Adding burst of prednisone with Flexeril.  Referral made to physical therapy.  Red flag symptoms reviewed.

## 2022-07-02 ENCOUNTER — Other Ambulatory Visit: Payer: Medicare Other

## 2022-07-07 ENCOUNTER — Encounter: Payer: Self-pay | Admitting: Family Medicine

## 2022-07-14 ENCOUNTER — Encounter: Payer: Self-pay | Admitting: Physical Therapy

## 2022-07-14 ENCOUNTER — Other Ambulatory Visit: Payer: Self-pay

## 2022-07-14 ENCOUNTER — Ambulatory Visit: Payer: Medicare Other | Attending: Family Medicine | Admitting: Physical Therapy

## 2022-07-14 DIAGNOSIS — M62830 Muscle spasm of back: Secondary | ICD-10-CM

## 2022-07-14 DIAGNOSIS — M5459 Other low back pain: Secondary | ICD-10-CM

## 2022-07-14 DIAGNOSIS — M5416 Radiculopathy, lumbar region: Secondary | ICD-10-CM | POA: Diagnosis not present

## 2022-07-14 NOTE — Therapy (Signed)
OUTPATIENT PHYSICAL THERAPY THORACOLUMBAR EVALUATION   Patient Name: Martha Perkins MRN: [id number] DOB:[birth year], 77 y.o., female Today's Date: 07/14/2022  END OF SESSION:  PT End of Session - 07/14/22 1302     Visit Number 1    Number of Visits 12    Date for PT Re-Evaluation 10/12/22    Authorization Type FOTO.    PT Start Time 1115    PT Stop Time 1157    PT Time Calculation (min) 42 min    Activity Tolerance Patient tolerated treatment well    Behavior During Therapy WFL for tasks assessed/performed             Past Medical History:  Diagnosis Date   Arthritis    neck, back, hips, knees, ankles   Asthma    use everyday   Atypical mole 12/03/2017   Left Lower Shin (mild)   Atypical mole 05/21/2018   Left Deltoid (mild)   Atypical mole 05/21/2018   Left Tricep (moderate to severe)   Cancer (HCC) 2019   skin   Dyspnea    Dysrhythmia    PVCs , Afib   Heart murmur    History of kidney stones    Hyperlipidemia    Hypertension    Hyperthyroidism 08/08/2017   radio active iodine x2   Hypothyroidism    now take synthroid   Lymphedema 12/03/2021   Paroxysmal atrial fibrillation (HCC) 08/09/2015   PONV (postoperative nausea and vomiting)    Superficial basal cell carcinoma (BCC) 12/03/2017   Right Outer Abdomen Mid (curet and 5FU)   Past Surgical History:  Procedure Laterality Date   CARDIOVERSION N/A 08/05/2016   Procedure: CARDIOVERSION;  Surgeon: Chilton Si, MD;  Location: [facility] ENDOSCOPY;  Service: Cardiovascular;  Laterality: N/A;   CATARACT EXTRACTION Left 09/2012   Dr. Darcus Pester   LITHOTRIPSY     REPLACEMENT TOTAL KNEE BILATERAL  2011   REVERSE SHOULDER ARTHROPLASTY Left 07/19/2020   Procedure: REVERSE SHOULDER ARTHROPLASTY;  Surgeon: Francena Hanly, MD;  Location: [facility] ORS;  Service: Orthopedics;  Laterality: Left;    REVERSE SHOULDER ARTHROPLASTY Right 12/06/2020   Procedure: REVERSE SHOULDER ARTHROPLASTY;  Surgeon: Francena Hanly, MD;   Location: [facility] OR[facility];  Service: Orthopedics;  Laterality: Right;    THYROIDECTOMY  1971   TUBAL LIGATION  1979   Patient Active Problem List   Diagnosis Date Noted   Acute lumbar radiculopathy 07/01/2022   Abnormal [place] (ultrasound) of abdomen 01/06/2022   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   Abnormal liver enzymes 01/06/2022   Lymphedema 12/03/2021   Venous insufficiency of both lower extremities 10/03/2020   S/P reverse total shoulder arthroplasty, 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   Aortic atherosclerosis (HCC), LDL goal < 70 12/26/2019   Palpitations 10/21/2019   Morbid obesity (HCC) 10/21/2019   Encounter for chronic pain management 06/30/2019   Hypothyroidism 09/11/2016   History of atrial fibrillation    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/14/2016   Restrictive lung disease 09/28/2015   Lumbar degenerative disc disease 08/15/2015   PAF (paroxysmal atrial fibrillation) (HCC) 08/09/2015   Chronic allergic rhinitis 05/17/2014   Cataract 06/03/2012   Hyperlipidemia LDL goal <100 12/21/2010   Essential hypertension, goal Less than 130/85 12/21/2010   GERD (gastroesophageal reflux disease) 07/18/2010   Insulin resistance 07/18/2010   Fatty liver 07/18/2010    REFERRING PROVIDER: Everrett Coombe.  REFERRING DIAG: Acute lumbar radiculopathy  Rationale for Evaluation and Treatment: Rehabilitation  THERAPY DIAG:  Other low back pain  Muscle spasm of back  ONSET DATE: ~2 weeks ago.  SUBJECTIVE:                                                                                                                                                                                           SUBJECTIVE STATEMENT: The patient presents to the clinic with c/o acute left sided low back pain (and occasionally on right) that came on her no apparent reason about two weeks ago. Prednisone was helpful to decrease her pain as she was initially in excruciating pain.  Her pain at rest today is a 2/10 but can still  go to high levels.  PERTINENT HISTORY:  HTN, bilateral TKA's, left TSA.  PAIN:  Are you having pain? Yes: NPRS scale: 2/10 Pain location: Left low back. Pain description: Ache. Aggravating factors: Not sure. Relieving factors: Not sure.  PRECAUTIONS: None  WEIGHT BEARING RESTRICTIONS: No  FALLS:  Has patient fallen in last 6 months? No  LIVING ENVIRONMENT: Lives with: lives with their spouse Lives in: House/apartment Has following equipment at home: None  OCCUPATION: Retired.  PLOF: Independent  PATIENT GOALS: Get out of pain.   OBJECTIVE:   PATIENT SURVEYS:  FOTO .   POSTURE: rounded shoulders and forward head  PALPATION: Tender left lower lumbar musculature which is very taut to palpation and also tender over her left SIJ and upper gluteal muscualture.  LUMBAR ROM:   Active lumbar flexion decreased by 25% and extension is 20 degrees.    LOWER EXTREMITY MMT:    Normal LE strength.  LUMBAR SPECIAL TESTS:  Equal leg lengths. (-) SLR testing.   GAIT: Slow and purposeful TODAY'S TREATMENT:                                                                                                                              DATE: HMP and IFC at 80-150 Hz on 40% scan x 20 minutes to patient's affected lumbo-sacral region.  Patient tolerated treatment without complaint with normal modality response following removal of modality.   ASSESSMENT:  CLINICAL IMPRESSION: The patient presents to OPPT with c/o  left low back pain that came on about two weeks ago for no apparent reason.  She was tender in her left lower lumbar musculature which is very taut to palpation and also tender over her left SIJ and upper gluteal muscualture.  Special testing is negative.  She has a slight lumbar flexion range of motion deficit.  Her pain was low at the time of her evaluation but can still rise to high levels.   Patient will benefit from skilled physical therapy intervention to address  pain and deficits.  OBJECTIVE IMPAIRMENTS: Abnormal gait, decreased activity tolerance, decreased ROM, increased muscle spasms, postural dysfunction, and pain.   ACTIVITY LIMITATIONS: carrying, lifting, bending, and locomotion level  PARTICIPATION LIMITATIONS: meal prep, cleaning, and laundry  REHAB POTENTIAL: Good  CLINICAL DECISION MAKING: Stable/uncomplicated  EVALUATION COMPLEXITY: Low   GOALS: SHORT TERM GOALS: Target date: 07/28/22.  Ind with a HEP. Goal status: INITIAL  LONG TERM GOALS: Target date: 10/12/22  Perform ADL's with pain not > 3/10.  Goal status: INITIAL  2.  Walk a community distance with pain not > 2-3/10.  Goal status: INITIAL  PLAN:  PT FREQUENCY: 2x/week  PT DURATION: 6 weeks  PLANNED INTERVENTIONS: Therapeutic exercises, Therapeutic activity, Neuromuscular re-education, Balance training, Gait training, Patient/Family education, Self Care, Joint mobilization, Dry Needling, Electrical stimulation, Cryotherapy, Moist heat, Ultrasound, and Manual therapy.  PLAN FOR NEXT SESSION: Modalities and STW/M as needed.  Core exercise progression.   Jadyn Brasher, [place], PT 07/14/2022, 1:17 PM

## 2022-07-16 IMAGING — CT CT ANGIO CHEST
2 of 6 series · 17 of 36 positions shown · IV contrast (omnipaque)
Comparison: Chest x-ray 12/06/2020 nuclear medicine imaging
07/19/2015
COMPARISON: Chest x-ray 12/06/2020 nuclear medicine imaging
07/19/2015

Addendum:
CLINICAL DATA: Shortness of breath

EXAM:
CT ANGIOGRAPHY CHEST WITH CONTRAST
TECHNIQUE: Multidetector CT imaging of the chest was performed using the
standard protocol during bolus administration of intravenous
contrast. Multiplanar CT image reconstructions and MIPs were
obtained to evaluate the vascular anatomy.
CONTRAST:  80mL OMNIPAQUE IOHEXOL 350 MG/ML SOLN

[Series 5: thins · axial · 0.64mm/px · z∈[+1216,+1460]mm · 16 of 276 slices shown]
[im 16/276  lung]
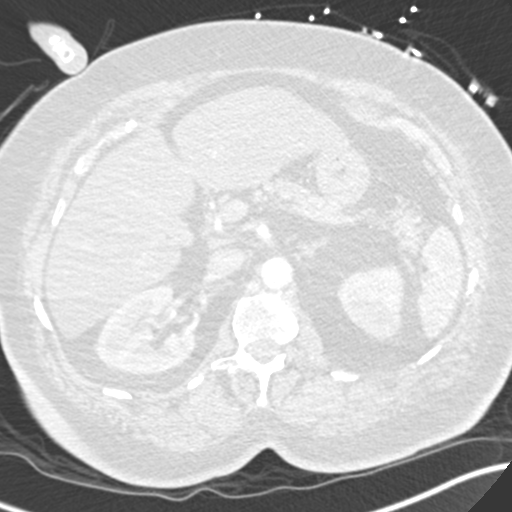
[im 31/276  mediastinal]
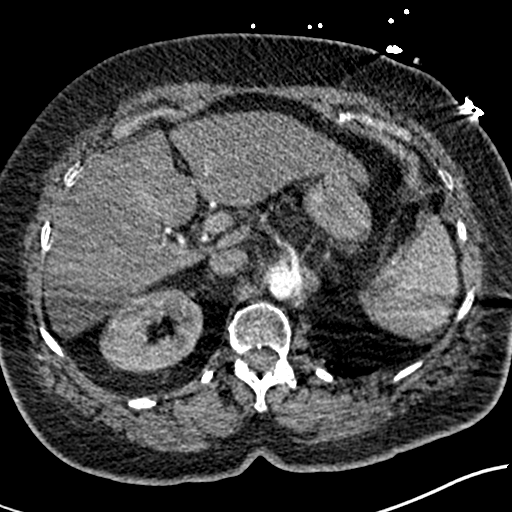
[im 46/276  lung]
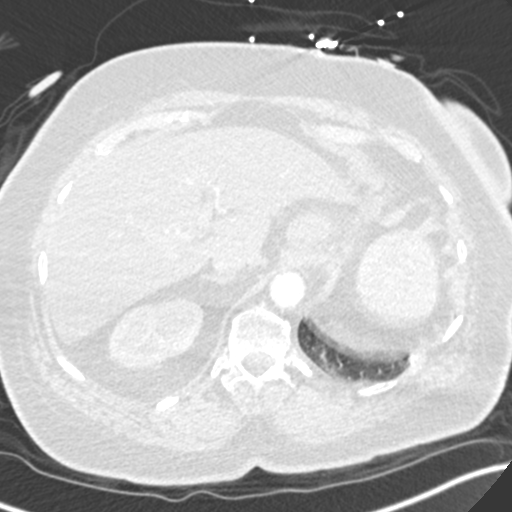
[im 62/276  mediastinal]
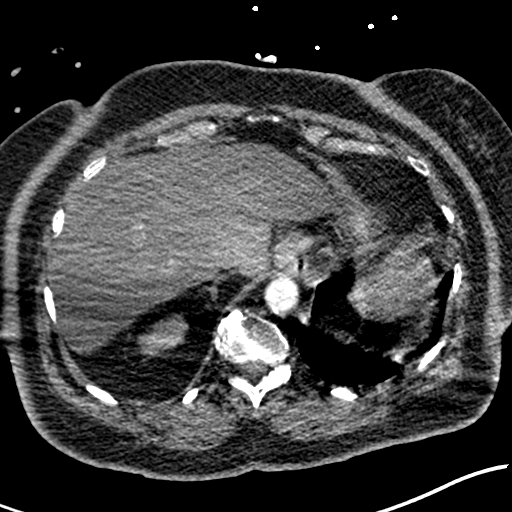
[im 77/276  lung]
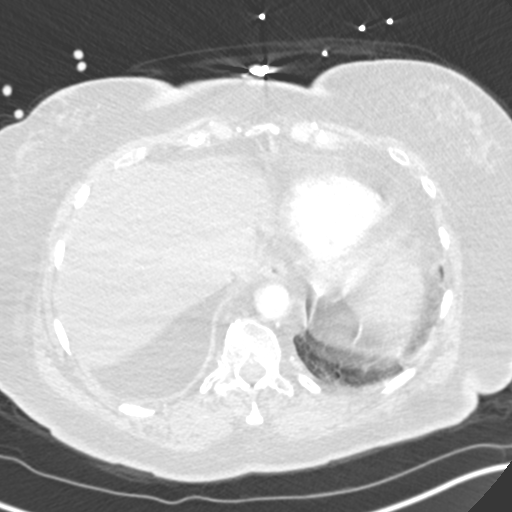
[im 92/276  mediastinal]
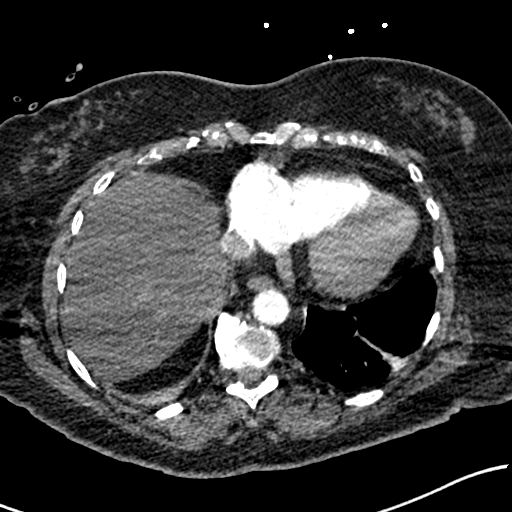
[im 107/276  lung]
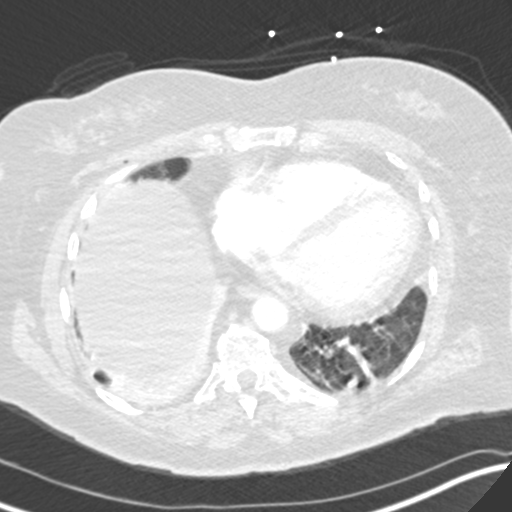
[im 123/276  mediastinal]
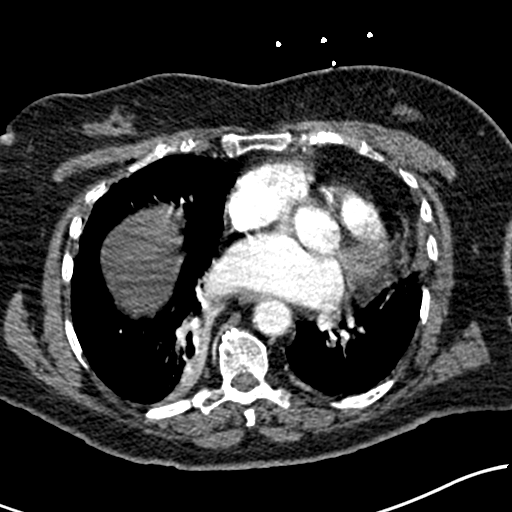
[im 153/276  lung]
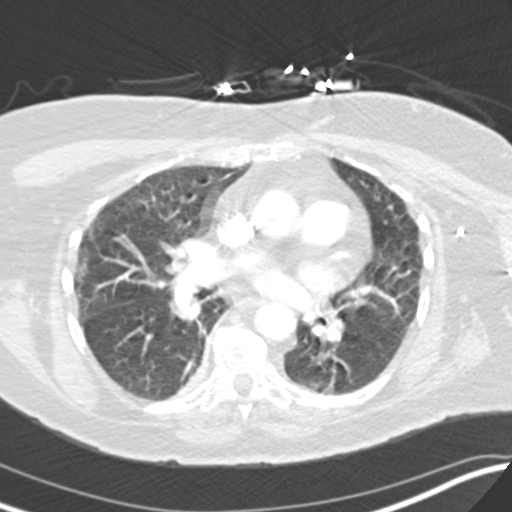
[im 169/276  mediastinal]
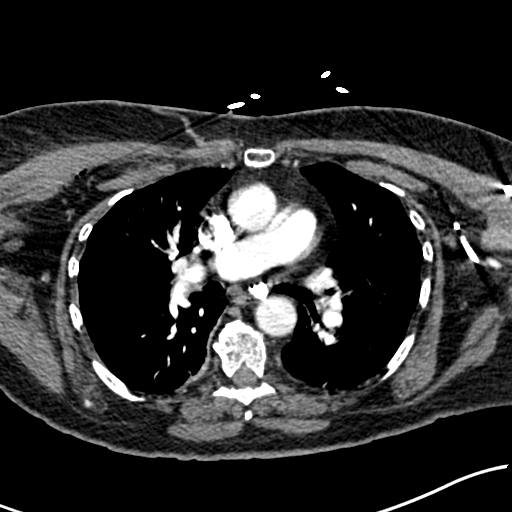
[im 184/276  lung]
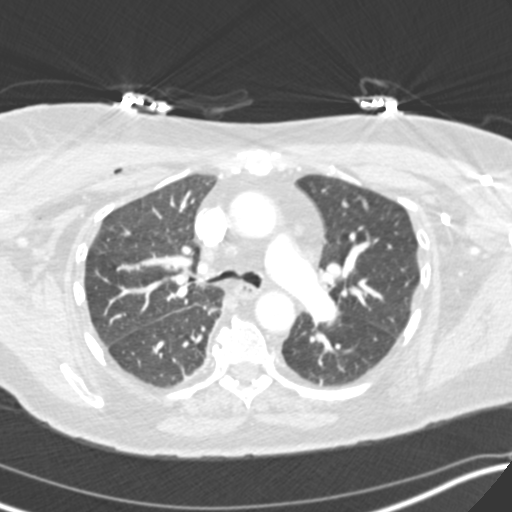
[im 199/276  mediastinal]
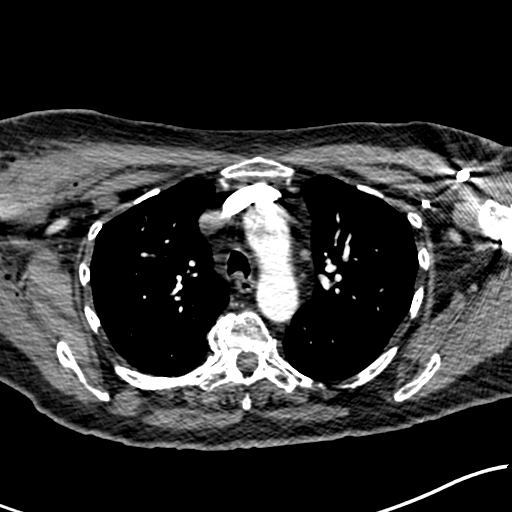
[im 214/276  lung]
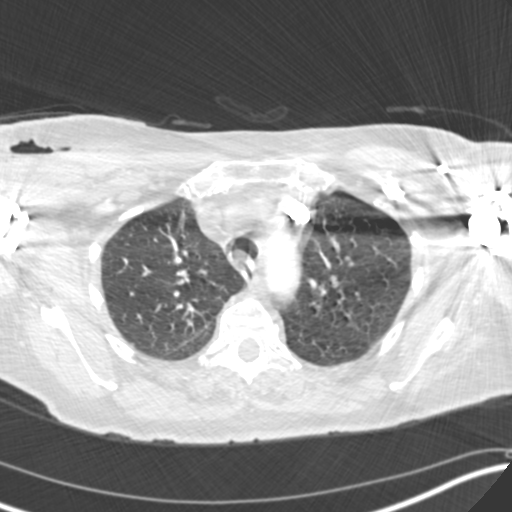
[im 230/276  mediastinal]
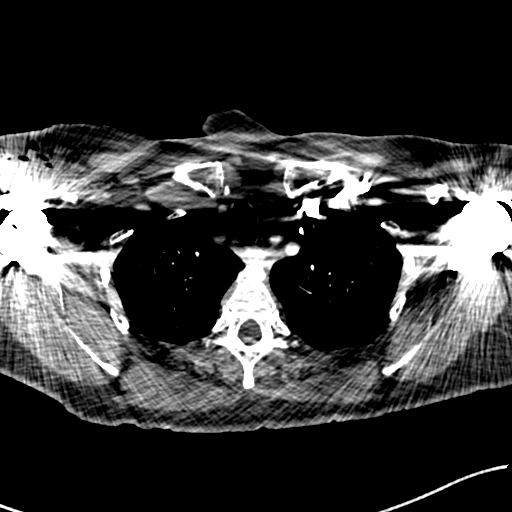
[im 245/276  lung]
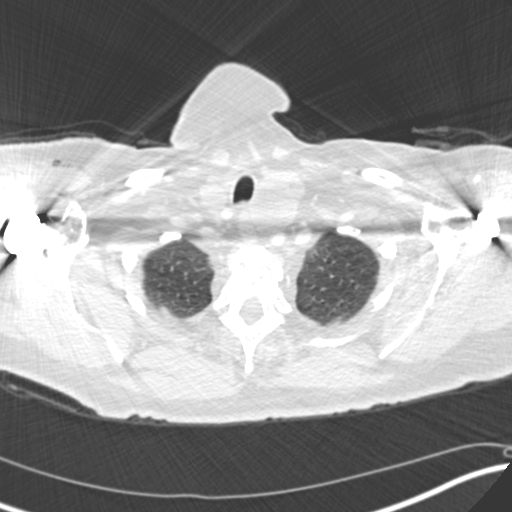
[im 260/276  mediastinal]
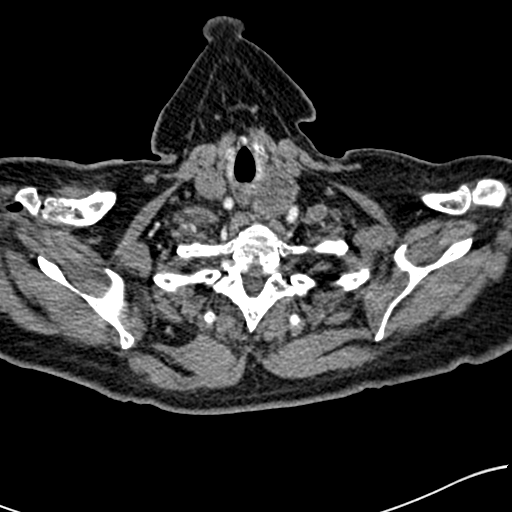

[Series 7: coronal mpr · coronal · 0.57mm/px · 1 of 135 slices shown]
[im 68/135  mediastinal]
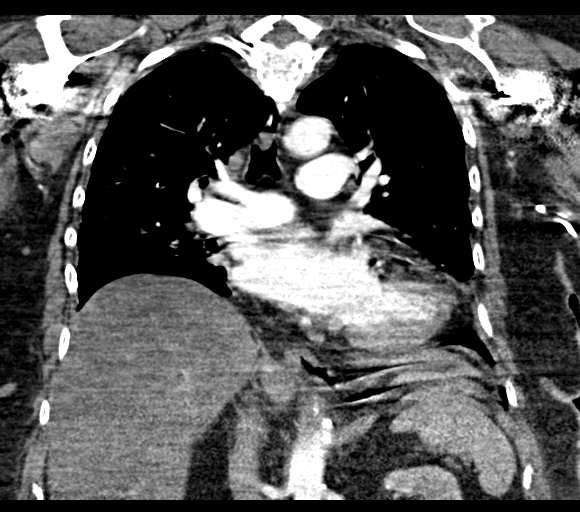

[17 of 36 positions shown; findings below may reference images not displayed]

FINDINGS: Cardiovascular: Satisfactory opacification of the pulmonary arteries
to the segmental level. No evidence of pulmonary embolism.
Nonaneurysmal aorta. Mild atherosclerosis. Coronary vascular
calcification. Borderline to mild cardiomegaly. No pericardial
effusion

Mediastinum/Nodes: Midline trachea. 3.4 cm hypodense mass in the
left lobe of thyroid. Calcified mediastinal and hilar nodes
consistent with prior granulomatous disease. Normal esophagus. Large
calcified granuloma left base. Subsegmental atelectasis left base.

Lungs/Pleura: No pleural effusion or pneumothorax. Partial
consolidation in the right lower lobe.

Upper Abdomen: Right kidney stone.  No acute abnormality

Musculoskeletal: No chest wall abnormality. No acute or significant
osseous findings.

Review of the MIP images confirms the above findings.
IMPRESSION: 1. Negative for acute pulmonary embolus. Borderline to mild
cardiomegaly.
2. Partial consolidation in the right lower lobe, favored to
represent atelectasis.
3. Right kidney stone
4. 3.4 cm hypodense mass in the left lobe of thyroid gland.
Recommend thyroid US (ref: [HOSPITAL]. [DATE]):
143-50).This may be performed non emergently

Aortic Atherosclerosis ([id number]-H4Q.Q).

ADDENDUM:
Additional finding. Gas within the soft tissues at the right
shoulder and chest wall related to recent shoulder replacement.

*** End of Addendum ***
FINDINGS: Cardiovascular: Satisfactory opacification of the pulmonary arteries
to the segmental level. No evidence of pulmonary embolism.
Nonaneurysmal aorta. Mild atherosclerosis. Coronary vascular
calcification. Borderline to mild cardiomegaly. No pericardial
effusion

Mediastinum/Nodes: Midline trachea. 3.4 cm hypodense mass in the
left lobe of thyroid. Calcified mediastinal and hilar nodes
consistent with prior granulomatous disease. Normal esophagus. Large
calcified granuloma left base. Subsegmental atelectasis left base.

Lungs/Pleura: No pleural effusion or pneumothorax. Partial
consolidation in the right lower lobe.

Upper Abdomen: Right kidney stone.  No acute abnormality

Musculoskeletal: No chest wall abnormality. No acute or significant
osseous findings.

Review of the MIP images confirms the above findings.
IMPRESSION: 1. Negative for acute pulmonary embolus. Borderline to mild
cardiomegaly.
2. Partial consolidation in the right lower lobe, favored to
represent atelectasis.
3. Right kidney stone
4. 3.4 cm hypodense mass in the left lobe of thyroid gland.
Recommend thyroid US (ref: [HOSPITAL]. [DATE]):
143-50).This may be performed non emergently

Aortic Atherosclerosis ([id number]-H4Q.Q).

## 2022-07-16 IMAGING — CR DG CHEST 2V
2 series · 2 of 2 positions shown · non-contrast
Comparison: 07/13/2015

CLINICAL DATA: Shortness of breath shoulder surgery postop day 0

EXAM:
CHEST - 2 VIEW

[x chest ap]
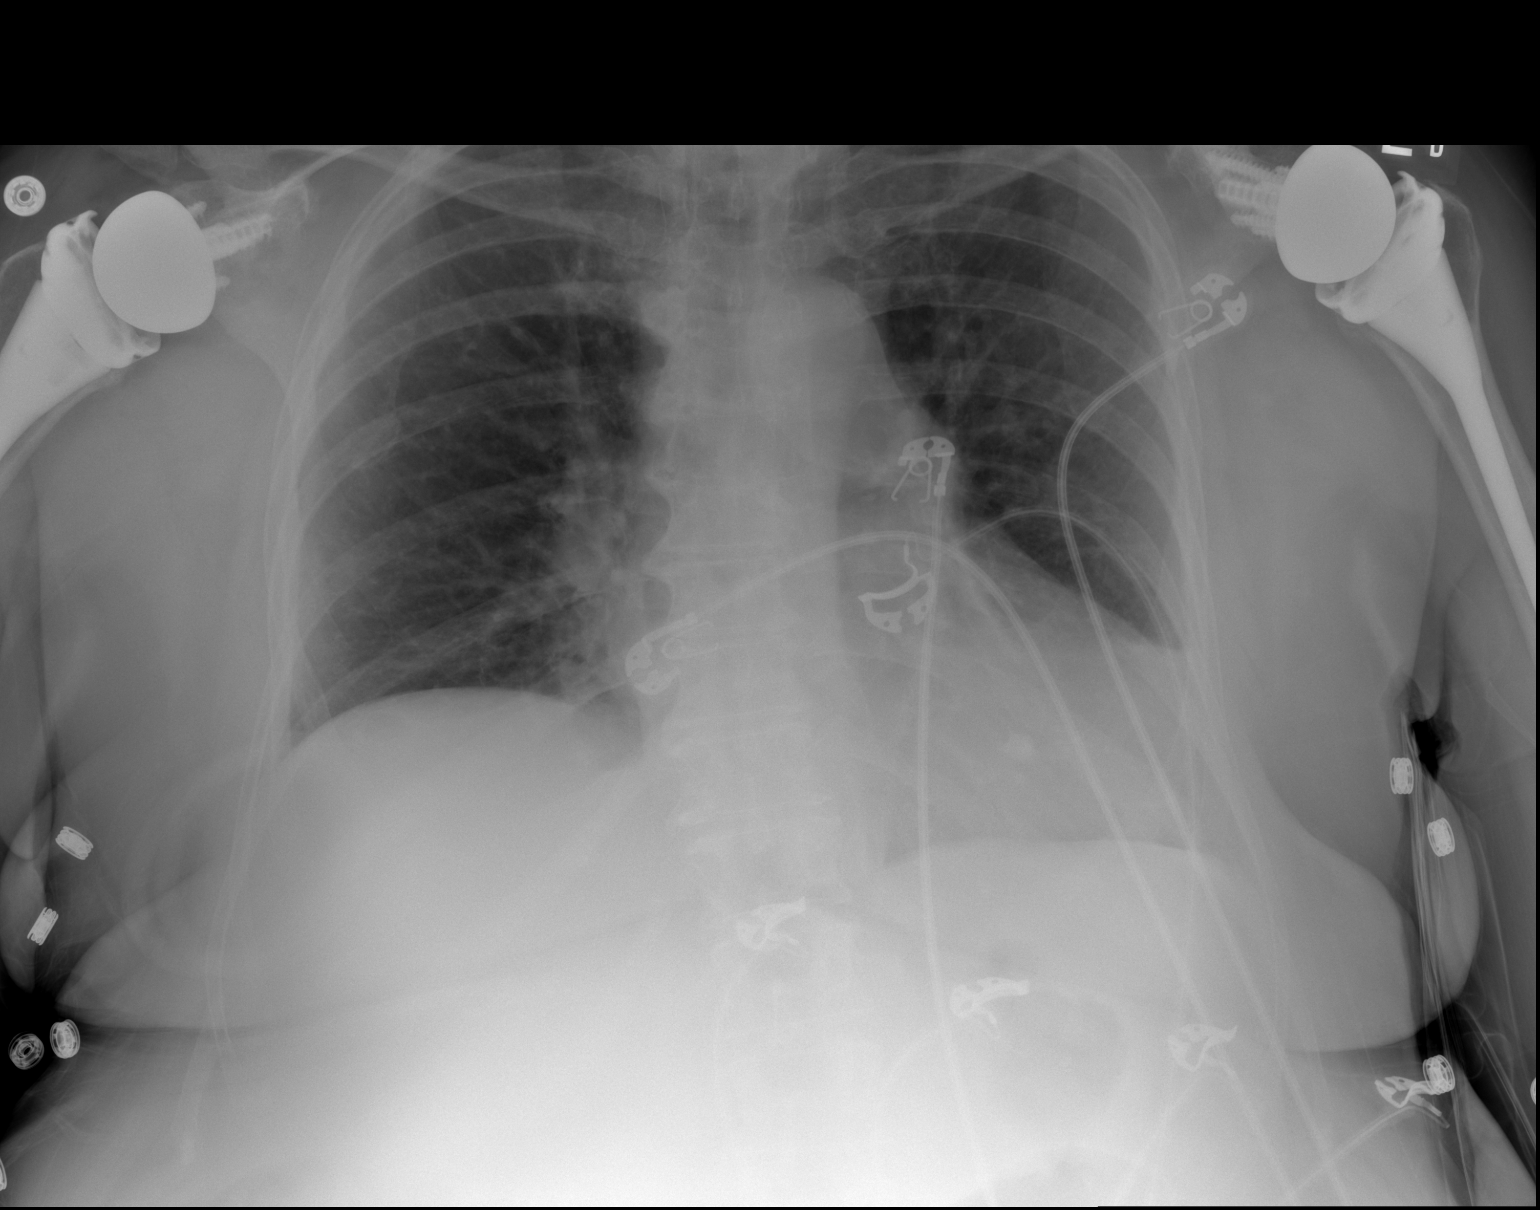

[w chest lat]
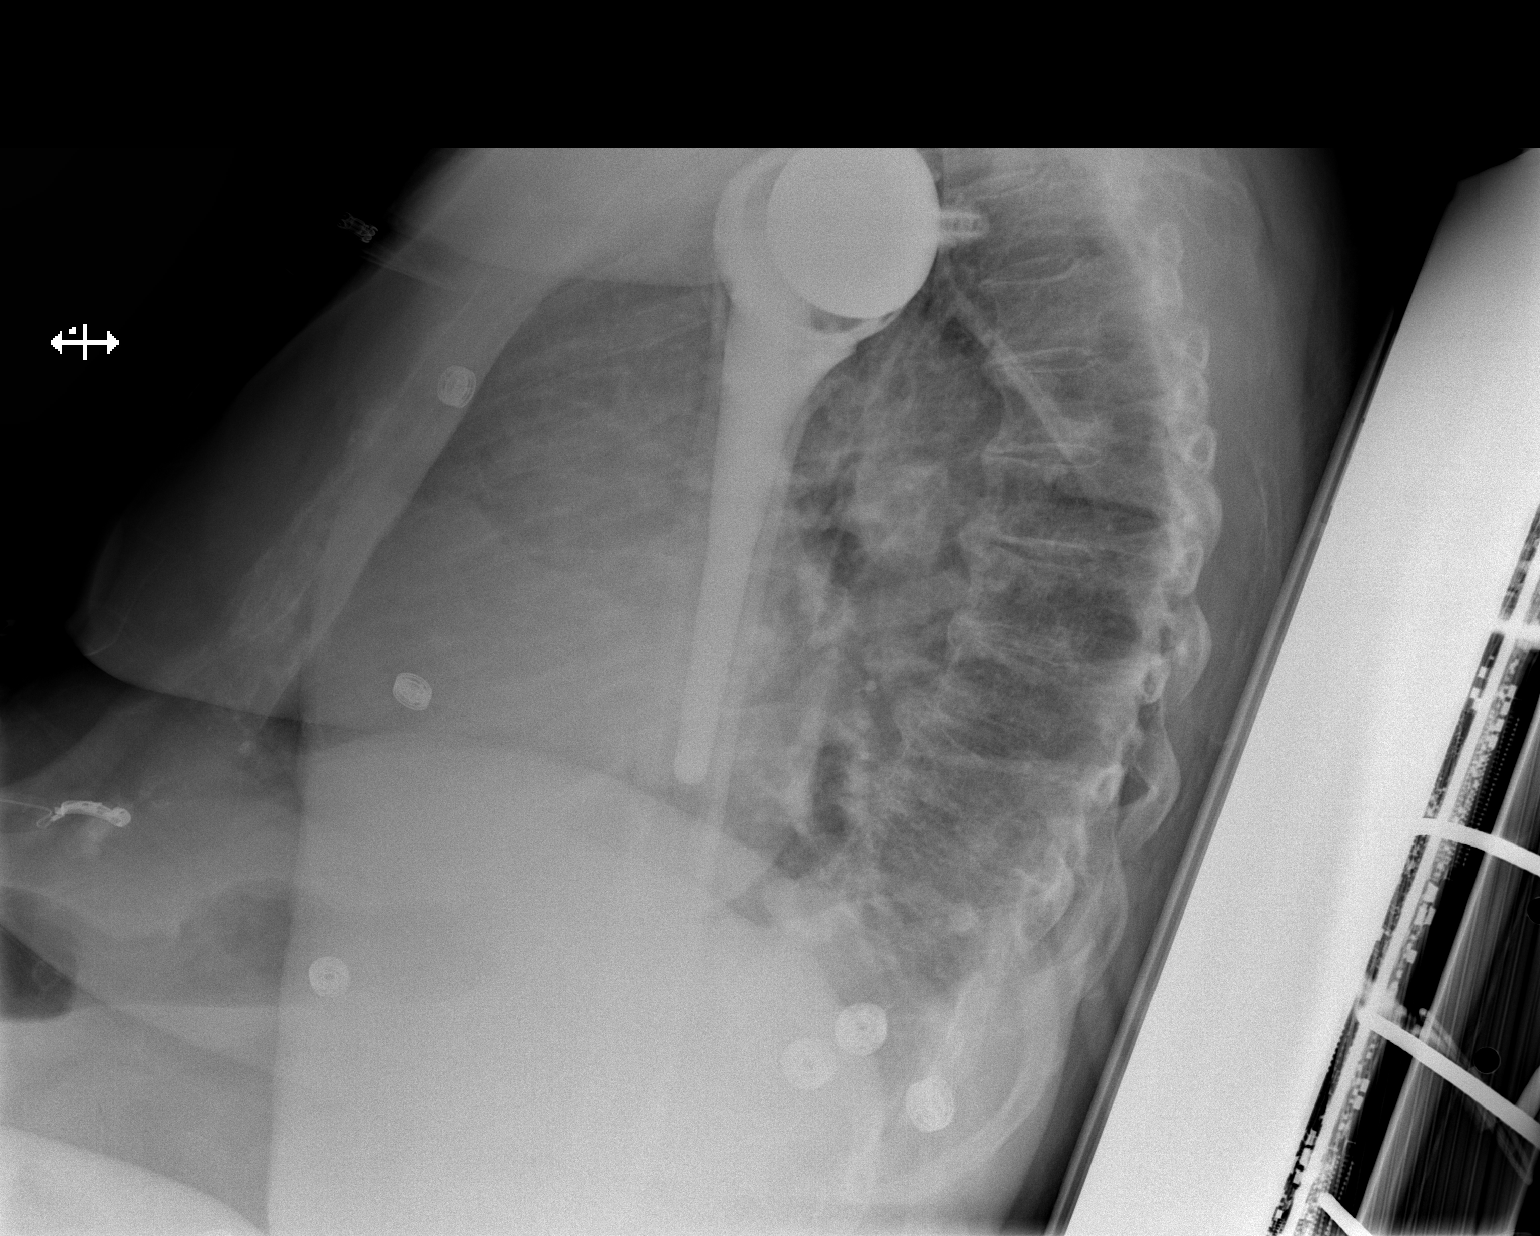

[2 of 2 positions shown; findings below may reference images not displayed]

FINDINGS: Mild cardiomegaly. No acute abnormality of the lungs. Status post
bilateral shoulder reverse arthroplasty. Disc degenerative disease
and partial ankylosis of the thoracic spine.
IMPRESSION: Mild cardiomegaly. No acute abnormality of the lungs.

## 2022-07-17 ENCOUNTER — Encounter: Payer: Self-pay | Admitting: Physical Therapy

## 2022-07-17 ENCOUNTER — Ambulatory Visit: Payer: Medicare Other | Admitting: Physical Therapy

## 2022-07-17 DIAGNOSIS — M5459 Other low back pain: Secondary | ICD-10-CM | POA: Diagnosis not present

## 2022-07-17 DIAGNOSIS — M62830 Muscle spasm of back: Secondary | ICD-10-CM | POA: Diagnosis not present

## 2022-07-17 DIAGNOSIS — M5416 Radiculopathy, lumbar region: Secondary | ICD-10-CM | POA: Diagnosis not present

## 2022-07-17 NOTE — Therapy (Signed)
OUTPATIENT PHYSICAL THERAPY THORACOLUMBAR TREATMENT   Patient Name: Martha Perkins MRN: [id number] DOB:1945/09/25, 77 y.o., female Today's Date: 07/17/2022  END OF SESSION:  PT End of Session - 07/17/22 1302     Visit Number 2    Number of Visits 12    Date for PT Re-Evaluation 10/12/22    Authorization Type FOTO.    PT Start Time 1302    PT Stop Time 1349    PT Time Calculation (min) 47 min    Activity Tolerance Patient tolerated treatment well    Behavior During Therapy WFL for tasks assessed/performed             Past Medical History:  Diagnosis Date   Arthritis    neck, back, hips, knees, ankles   Asthma    use everyday   Atypical mole 12/03/2017   Left Lower Shin (mild)   Atypical mole 05/21/2018   Left Deltoid (mild)   Atypical mole 05/21/2018   Left Tricep (moderate to severe)   Cancer (HCC) 2019   skin   Dyspnea    Dysrhythmia    PVCs , Afib   Heart murmur    History of kidney stones    Hyperlipidemia    Hypertension    Hyperthyroidism 08/08/2017   radio active iodine x2   Hypothyroidism    now take synthroid   Lymphedema 12/03/2021   Paroxysmal atrial fibrillation (HCC) 08/09/2015   PONV (postoperative nausea and vomiting)    Superficial basal cell carcinoma (BCC) 12/03/2017   Right Outer Abdomen Mid (curet and 5FU)   Past Surgical History:  Procedure Laterality Date   CARDIOVERSION N/A 08/05/2016   Procedure: CARDIOVERSION;  Surgeon: Chilton Si, MD;  Location: [facility] ENDOSCOPY;  Service: Cardiovascular;  Laterality: N/A;   CATARACT EXTRACTION Left 09/2012   Dr. Darcus Pester   LITHOTRIPSY     REPLACEMENT TOTAL KNEE BILATERAL  2011   REVERSE SHOULDER ARTHROPLASTY Left 07/19/2020   Procedure: REVERSE SHOULDER ARTHROPLASTY;  Surgeon: Francena Hanly, MD;  Location: [facility]S;  Service: Orthopedics;  Laterality: Left;    REVERSE SHOULDER ARTHROPLASTY Right 12/06/2020   Procedure: REVERSE SHOULDER ARTHROPLASTY;  Surgeon: Francena Hanly, MD;   Location: [facility];  Service: Orthopedics;  Laterality: Right;    THYROIDECTOMY  1971   TUBAL LIGATION  1979   Patient Active Problem List   Diagnosis Date Noted   Acute lumbar radiculopathy 07/01/2022   Abnormal [place] (ultrasound) of abdomen 01/06/2022   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   Abnormal liver enzymes 01/06/2022   Lymphedema 12/03/2021   Venous insufficiency of both lower extremities 10/03/2020   S/P reverse total shoulder arthroplasty, 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   Aortic atherosclerosis (HCC), LDL goal < 70 12/26/2019   Palpitations 10/21/2019   Morbid obesity (HCC) 10/21/2019   Encounter for chronic pain management 06/30/2019   Hypothyroidism 09/11/2016   History of atrial fibrillation    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/14/2016   Restrictive lung disease 09/28/2015   Lumbar degenerative disc disease 08/15/2015   PAF (paroxysmal atrial fibrillation) (HCC) 08/09/2015   Chronic allergic rhinitis 05/17/2014   Cataract 06/03/2012   Hyperlipidemia LDL goal <100 12/21/2010   Essential hypertension, goal Less than 130/85 12/21/2010   GERD (gastroesophageal reflux disease) 07/18/2010   Insulin resistance 07/18/2010   Fatty liver 07/18/2010   REFERRING PROVIDER: Everrett Coombe.  REFERRING DIAG: Acute lumbar radiculopathy  Rationale for Evaluation and Treatment: Rehabilitation  THERAPY DIAG:  Other low back pain  Muscle spasm of back  ONSET DATE: ~2 weeks ago.  SUBJECTIVE:                                                                                                                                                                                           SUBJECTIVE STATEMENT: Reports that she has felt like after a few steps her back is going to give out and fatigued. Reports some pain shooting across her low back.  PERTINENT HISTORY:  HTN, bilateral TKA's, left TSA.  PAIN:  Are you having pain? Yes: NPRS scale: 3/10 Pain location: Low back. Pain description:  Ache. Aggravating factors: Not sure. Relieving factors: Not sure.  PRECAUTIONS: None  PATIENT GOALS: Get out of pain.  OBJECTIVE:   PATIENT SURVEYS:  FOTO .  POSTURE: rounded shoulders and forward head  PALPATION: Tender left lower lumbar musculature which is very taut to palpation and also tender over her left SIJ and upper gluteal muscualture.  LUMBAR ROM:  Active lumbar flexion decreased by 25% and extension is 20 degrees.  LOWER EXTREMITY MMT:   Normal LE strength.  LUMBAR SPECIAL TESTS:  Equal leg lengths. (-) SLR testing.  GAIT: Slow and purposeful  TODAY'S TREATMENT:                                                                                                                              DATE: 07/17/22  Manual Therapy Soft Tissue Mobilization: L SI/glute, to reduce pain and tone    Modalities  Date: 07/17/22 Unattended Estim: Lumbar, IFC, 15 mins, Pain and Tone Combo: Lumbar, 1.5 w/cm2, 100%, 1 mhz, 10 mins, Pain and Tone  ASSESSMENT:  CLINICAL IMPRESSION: Patient presented in clinic with reports of moderate LBP and intermittent shooting pains across low back. Patient slow and purposeful with all transfers and gait due to LBP. Patient presented with increased tone of the L glute and tenderness of the L SI region. Normal modalities response noted following removal of the modalities.  OBJECTIVE IMPAIRMENTS: Abnormal gait, decreased activity tolerance, decreased ROM, increased muscle spasms, postural dysfunction, and pain.   ACTIVITY LIMITATIONS: carrying, lifting,  bending, and locomotion level  PARTICIPATION LIMITATIONS: meal prep, cleaning, and laundry  REHAB POTENTIAL: Good  CLINICAL DECISION MAKING: Stable/uncomplicated  EVALUATION COMPLEXITY: Low  GOALS: SHORT TERM GOALS: Target date: 07/28/22.  Ind with a HEP. Goal status: INITIAL  LONG TERM GOALS: Target date: 10/12/22  Perform ADL's with pain not > 3/10.  Goal status: INITIAL  2.  Walk a  community distance with pain not > 2-3/10.  Goal status: INITIAL  PLAN:  PT FREQUENCY: 2x/week  PT DURATION: 6 weeks  PLANNED INTERVENTIONS: Therapeutic exercises, Therapeutic activity, Neuromuscular re-education, Balance training, Gait training, Patient/Family education, Self Care, Joint mobilization, Dry Needling, Electrical stimulation, Cryotherapy, Moist heat, Ultrasound, and Manual therapy.  PLAN FOR NEXT SESSION: Modalities and STW/M as needed.  Core exercise progression.  Marvell Fuller, PTA 07/17/2022, 3:26 PM

## 2022-07-18 DIAGNOSIS — I890 Lymphedema, not elsewhere classified: Secondary | ICD-10-CM | POA: Diagnosis not present

## 2022-07-18 DIAGNOSIS — I872 Venous insufficiency (chronic) (peripheral): Secondary | ICD-10-CM | POA: Diagnosis not present

## 2022-07-19 ENCOUNTER — Encounter: Payer: Self-pay | Admitting: Family Medicine

## 2022-07-21 ENCOUNTER — Ambulatory Visit: Payer: Medicare Other | Admitting: Physical Therapy

## 2022-07-21 DIAGNOSIS — M5459 Other low back pain: Secondary | ICD-10-CM | POA: Diagnosis not present

## 2022-07-21 DIAGNOSIS — M62830 Muscle spasm of back: Secondary | ICD-10-CM | POA: Diagnosis not present

## 2022-07-21 DIAGNOSIS — M5416 Radiculopathy, lumbar region: Secondary | ICD-10-CM | POA: Diagnosis not present

## 2022-07-21 NOTE — Therapy (Signed)
OUTPATIENT PHYSICAL THERAPY THORACOLUMBAR TREATMENT   Patient Name: Martha Perkins MRN: [id number] DOB:1945-11-30, 77 y.o., female Today's Date: 07/21/2022  END OF SESSION:  PT End of Session - 07/21/22 1202     Visit Number 3    Number of Visits 12    Date for PT Re-Evaluation 10/12/22    Authorization Type FOTO.    PT Start Time 1030    PT Stop Time 1122    PT Time Calculation (min) 52 min    Activity Tolerance Patient tolerated treatment well    Behavior During Therapy WFL for tasks assessed/performed             Past Medical History:  Diagnosis Date   Arthritis    neck, back, hips, knees, ankles   Asthma    use everyday   Atypical mole 12/03/2017   Left Lower Shin (mild)   Atypical mole 05/21/2018   Left Deltoid (mild)   Atypical mole 05/21/2018   Left Tricep (moderate to severe)   Cancer (HCC) 2019   skin   Dyspnea    Dysrhythmia    PVCs , Afib   Heart murmur    History of kidney stones    Hyperlipidemia    Hypertension    Hyperthyroidism 08/08/2017   radio active iodine x2   Hypothyroidism    now take synthroid   Lymphedema 12/03/2021   Paroxysmal atrial fibrillation (HCC) 08/09/2015   PONV (postoperative nausea and vomiting)    Superficial basal cell carcinoma (BCC) 12/03/2017   Right Outer Abdomen Mid (curet and 5FU)   Past Surgical History:  Procedure Laterality Date   CARDIOVERSION N/A 08/05/2016   Procedure: CARDIOVERSION;  Surgeon: Chilton Si, MD;  Location: [facility] ENDOSCOPY;  Service: Cardiovascular;  Laterality: N/A;   CATARACT EXTRACTION Left 09/2012   Dr. Darcus Pester   LITHOTRIPSY     REPLACEMENT TOTAL KNEE BILATERAL  2011   REVERSE SHOULDER ARTHROPLASTY Left 07/19/2020   Procedure: REVERSE SHOULDER ARTHROPLASTY;  Surgeon: Francena Hanly, MD;  Location: [facility];  Service: Orthopedics;  Laterality: Left;    REVERSE SHOULDER ARTHROPLASTY Right 12/06/2020   Procedure: REVERSE SHOULDER ARTHROPLASTY;  Surgeon: Francena Hanly, MD;   Location: [facility];  Service: Orthopedics;  Laterality: Right;    THYROIDECTOMY  1971   TUBAL LIGATION  1979   Patient Active Problem List   Diagnosis Date Noted   Acute lumbar radiculopathy 07/01/2022   Abnormal [place] (ultrasound) of abdomen 01/06/2022   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   Abnormal liver enzymes 01/06/2022   Lymphedema 12/03/2021   Venous insufficiency of both lower extremities 10/03/2020   S/P reverse total shoulder arthroplasty, 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   Aortic atherosclerosis (HCC), LDL goal < 70 12/26/2019   Palpitations 10/21/2019   Morbid obesity (HCC) 10/21/2019   Encounter for chronic pain management 06/30/2019   Hypothyroidism 09/11/2016   History of atrial fibrillation    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/14/2016   Restrictive lung disease 09/28/2015   Lumbar degenerative disc disease 08/15/2015   PAF (paroxysmal atrial fibrillation) (HCC) 08/09/2015   Chronic allergic rhinitis 05/17/2014   Cataract 06/03/2012   Hyperlipidemia LDL goal <100 12/21/2010   Essential hypertension, goal Less than 130/85 12/21/2010   GERD (gastroesophageal reflux disease) 07/18/2010   Insulin resistance 07/18/2010   Fatty liver 07/18/2010   REFERRING PROVIDER: Everrett Coombe.  REFERRING DIAG: Acute lumbar radiculopathy  Rationale for Evaluation and Treatment: Rehabilitation  THERAPY DIAG:  Other low back pain  Muscle spasm of back  ONSET DATE: ~2 weeks ago.  SUBJECTIVE:                                                                                                                                                                                           SUBJECTIVE STATEMENT: Doing okay. PERTINENT HISTORY:  HTN, bilateral TKA's, left TSA.  PAIN:  Are you having pain? Yes: NPRS scale: 3/10 Pain location: Low back. Pain description: Ache. Aggravating factors: Not sure. Relieving factors: Not sure.  PRECAUTIONS: None  PATIENT GOALS: Get out of  pain.  OBJECTIVE:   PATIENT SURVEYS:  FOTO .  POSTURE: rounded shoulders and forward head  PALPATION: Tender left lower lumbar musculature which is very taut to palpation and also tender over her left SIJ and upper gluteal muscualture.  LUMBAR ROM:  Active lumbar flexion decreased by 25% and extension is 20 degrees.  LOWER EXTREMITY MMT:   Normal LE strength.  LUMBAR SPECIAL TESTS:  Equal leg lengths. (-) SLR testing.  GAIT: Slow and purposeful  TODAY'S TREATMENT:                                                                                                                              DATE: 07/21/22  Trigger Point Dry-Needling  Treatment instructions: Expect mild to moderate muscle soreness. S/S of pneumothorax if dry needled over a lung field, and to seek immediate medical attention should they occur. Patient verbalized understanding of these instructions and education. Patient Consent Given: Yes Education handout provided: Yes Muscles treated: Left upper glut med. F/b [place] at 1.50 W/CM2 x 12 minutes f/b STW/M x 11 minutes f/b HMP and IFC at 80-150 Hz on 40% scan x 20 minutes.  Patient tolerated treatment without complaint with normal modality response following removal of modality.   ASSESSMENT:  CLINICAL IMPRESSION: Patient did very well with dry needling to her left glut med.  Felt better after treatment.  OBJECTIVE IMPAIRMENTS: Abnormal gait, decreased activity tolerance, decreased ROM, increased muscle spasms, postural dysfunction, and pain.   ACTIVITY LIMITATIONS: carrying, lifting, bending, and locomotion level  PARTICIPATION LIMITATIONS: meal  prep, cleaning, and laundry  REHAB POTENTIAL: Good  CLINICAL DECISION MAKING: Stable/uncomplicated  EVALUATION COMPLEXITY: Low  GOALS: SHORT TERM GOALS: Target date: 07/28/22.  Ind with a HEP. Goal status: INITIAL  LONG TERM GOALS: Target date: 10/12/22  Perform ADL's with pain not > 3/10.  Goal status:  INITIAL  2.  Walk a community distance with pain not > 2-3/10.  Goal status: INITIAL  PLAN:  PT FREQUENCY: 2x/week  PT DURATION: 6 weeks  PLANNED INTERVENTIONS: Therapeutic exercises, Therapeutic activity, Neuromuscular re-education, Balance training, Gait training, Patient/Family education, Self Care, Joint mobilization, Dry Needling, Electrical stimulation, Cryotherapy, Moist heat, Ultrasound, and Manual therapy.  PLAN FOR NEXT SESSION: Modalities and STW/M as needed.  Core exercise progression.  Sania Noy, [place], PT 07/21/2022, 12:06 PM

## 2022-07-22 DIAGNOSIS — R932 Abnormal findings on diagnostic imaging of liver and biliary tract: Secondary | ICD-10-CM | POA: Diagnosis not present

## 2022-07-22 DIAGNOSIS — K760 Fatty (change of) liver, not elsewhere classified: Secondary | ICD-10-CM | POA: Diagnosis not present

## 2022-07-23 ENCOUNTER — Ambulatory Visit: Payer: Medicare Other | Admitting: Physical Therapy

## 2022-07-23 DIAGNOSIS — M5416 Radiculopathy, lumbar region: Secondary | ICD-10-CM | POA: Diagnosis not present

## 2022-07-23 DIAGNOSIS — M5459 Other low back pain: Secondary | ICD-10-CM | POA: Diagnosis not present

## 2022-07-23 DIAGNOSIS — M62830 Muscle spasm of back: Secondary | ICD-10-CM | POA: Diagnosis not present

## 2022-07-23 NOTE — Therapy (Signed)
OUTPATIENT PHYSICAL THERAPY THORACOLUMBAR TREATMENT   Patient Name: Martha Perkins MRN: [id number] DOB:1945/09/03, 77 y.o., female Today's Date: 07/23/2022  END OF SESSION:  PT End of Session - 07/23/22 1211     Visit Number 4    Number of Visits 12    Date for PT Re-Evaluation 10/12/22    Authorization Type FOTO.    PT Start Time 1030    PT Stop Time 1123    PT Time Calculation (min) 53 min    Activity Tolerance Patient tolerated treatment well    Behavior During Therapy WFL for tasks assessed/performed             Past Medical History:  Diagnosis Date   Arthritis    neck, back, hips, knees, ankles   Asthma    use everyday   Atypical mole 12/03/2017   Left Lower Shin (mild)   Atypical mole 05/21/2018   Left Deltoid (mild)   Atypical mole 05/21/2018   Left Tricep (moderate to severe)   Cancer (HCC) 2019   skin   Dyspnea    Dysrhythmia    PVCs , Afib   Heart murmur    History of kidney stones    Hyperlipidemia    Hypertension    Hyperthyroidism 08/08/2017   radio active iodine x2   Hypothyroidism    now take synthroid   Lymphedema 12/03/2021   Paroxysmal atrial fibrillation (HCC) 08/09/2015   PONV (postoperative nausea and vomiting)    Superficial basal cell carcinoma (BCC) 12/03/2017   Right Outer Abdomen Mid (curet and 5FU)   Past Surgical History:  Procedure Laterality Date   CARDIOVERSION N/A 08/05/2016   Procedure: CARDIOVERSION;  Surgeon: Chilton Si, MD;  Location: [facility] ENDOSCOPY;  Service: Cardiovascular;  Laterality: N/A;   CATARACT EXTRACTION Left 09/2012   Dr. Darcus Pester   LITHOTRIPSY     REPLACEMENT TOTAL KNEE BILATERAL  2011   REVERSE SHOULDER ARTHROPLASTY Left 07/19/2020   Procedure: REVERSE SHOULDER ARTHROPLASTY;  Surgeon: Francena Hanly, MD;  Location: [facility];  Service: Orthopedics;  Laterality: Left;    REVERSE SHOULDER ARTHROPLASTY Right 12/06/2020   Procedure: REVERSE SHOULDER ARTHROPLASTY;  Surgeon: Francena Hanly, MD;   Location: [facility];  Service: Orthopedics;  Laterality: Right;    THYROIDECTOMY  1971   TUBAL LIGATION  1979   Patient Active Problem List   Diagnosis Date Noted   Acute lumbar radiculopathy 07/01/2022   Abnormal [place] (ultrasound) of abdomen 01/06/2022   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   Abnormal liver enzymes 01/06/2022   Lymphedema 12/03/2021   Venous insufficiency of both lower extremities 10/03/2020   S/P reverse total shoulder arthroplasty, 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   Aortic atherosclerosis (HCC), LDL goal < 70 12/26/2019   Palpitations 10/21/2019   Morbid obesity (HCC) 10/21/2019   Encounter for chronic pain management 06/30/2019   Hypothyroidism 09/11/2016   History of atrial fibrillation    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/14/2016   Restrictive lung disease 09/28/2015   Lumbar degenerative disc disease 08/15/2015   PAF (paroxysmal atrial fibrillation) (HCC) 08/09/2015   Chronic allergic rhinitis 05/17/2014   Cataract 06/03/2012   Hyperlipidemia LDL goal <100 12/21/2010   Essential hypertension, goal Less than 130/85 12/21/2010   GERD (gastroesophageal reflux disease) 07/18/2010   Insulin resistance 07/18/2010   Fatty liver 07/18/2010   REFERRING PROVIDER: Everrett Coombe.  REFERRING DIAG: Acute lumbar radiculopathy  Rationale for Evaluation and Treatment: Rehabilitation  THERAPY DIAG:  Other low back pain  Muscle spasm of back  ONSET DATE: ~2 weeks ago.  SUBJECTIVE:                                                                                                                                                                                           SUBJECTIVE STATEMENT: Sore from dry needling.  PERTINENT HISTORY:  HTN, bilateral TKA's, left TSA.  PAIN:  Are you having pain? Yes: NPRS scale: 3/10 Pain location: Low back. Pain description: Ache. Aggravating factors: Not sure. Relieving factors: Not sure.  PRECAUTIONS: None  PATIENT GOALS: Get out of  pain.  OBJECTIVE:   PATIENT SURVEYS:  FOTO .  POSTURE: rounded shoulders and forward head  PALPATION: Tender left lower lumbar musculature which is very taut to palpation and also tender over her left SIJ and upper gluteal muscualture.  LUMBAR ROM:  Active lumbar flexion decreased by 25% and extension is 20 degrees.  LOWER EXTREMITY MMT:   Normal LE strength.  LUMBAR SPECIAL TESTS:  Equal leg lengths. (-) SLR testing.  GAIT: Slow and purposeful  TODAY'S TREATMENT:                                                                                                                              DATE: 07/23/22   Muscles treated: Left upper glut med. F/b Combo e'stim/US at 1.50 W/CM2 x 12 minutes f/b STW/M x 12 minutes f/b HMP and IFC at 80-150 Hz on 40% scan x 20 minutes.  Patient tolerated treatment without complaint with normal modality response following removal of modality.   ASSESSMENT:  CLINICAL IMPRESSION: Patient sore from dry needling with pain remaining at a 3/10.  Good response to treatment tolerating without complaint.  OBJECTIVE IMPAIRMENTS: Abnormal gait, decreased activity tolerance, decreased ROM, increased muscle spasms, postural dysfunction, and pain.   ACTIVITY LIMITATIONS: carrying, lifting, bending, and locomotion level  PARTICIPATION LIMITATIONS: meal prep, cleaning, and laundry  REHAB POTENTIAL: Good  CLINICAL DECISION MAKING: Stable/uncomplicated  EVALUATION COMPLEXITY: Low  GOALS: SHORT TERM GOALS: Target date: 07/28/22.  Ind with a HEP. Goal status: INITIAL  LONG TERM GOALS: Target date: 10/12/22  Perform ADL's with pain not > 3/10.  Goal status: INITIAL  2.  Walk a community distance with pain not > 2-3/10.  Goal status: INITIAL  PLAN:  PT FREQUENCY: 2x/week  PT DURATION: 6 weeks  PLANNED INTERVENTIONS: Therapeutic exercises, Therapeutic activity, Neuromuscular re-education, Balance training, Gait training, Patient/Family education,  Self Care, Joint mobilization, Dry Needling, Electrical stimulation, Cryotherapy, Moist heat, Ultrasound, and Manual therapy.  PLAN FOR NEXT SESSION: Modalities and STW/M as needed.  Core exercise progression.  Kathrynne Kulinski, [place], PT 07/23/2022, 12:22 PM

## 2022-07-29 ENCOUNTER — Other Ambulatory Visit: Payer: Self-pay | Admitting: Family Medicine

## 2022-07-29 ENCOUNTER — Ambulatory Visit: Payer: Medicare Other | Admitting: Physical Therapy

## 2022-07-29 DIAGNOSIS — M5136 Other intervertebral disc degeneration, lumbar region: Secondary | ICD-10-CM

## 2022-07-29 DIAGNOSIS — M5416 Radiculopathy, lumbar region: Secondary | ICD-10-CM | POA: Diagnosis not present

## 2022-07-29 DIAGNOSIS — M5459 Other low back pain: Secondary | ICD-10-CM | POA: Diagnosis not present

## 2022-07-29 DIAGNOSIS — G8929 Other chronic pain: Secondary | ICD-10-CM

## 2022-07-29 DIAGNOSIS — I10 Essential (primary) hypertension: Secondary | ICD-10-CM

## 2022-07-29 DIAGNOSIS — M62830 Muscle spasm of back: Secondary | ICD-10-CM

## 2022-07-29 NOTE — Therapy (Signed)
OUTPATIENT PHYSICAL THERAPY THORACOLUMBAR TREATMENT   Patient Name: Martha Perkins MRN: [id number] DOB:[birth year], 77 y.o., female Today's Date: 07/29/2022  END OF SESSION:  PT End of Session - 07/29/22 1205     Visit Number 5    Number of Visits 12    Date for PT Re-Evaluation 10/12/22    Authorization Type FOTO.    PT Start Time 1117    PT Stop Time 1216    PT Time Calculation (min) 59 min    Activity Tolerance Patient tolerated treatment well    Behavior During Therapy WFL for tasks assessed/performed             Past Medical History:  Diagnosis Date   Arthritis    neck, back, hips, knees, ankles   Asthma    use everyday   Atypical mole 12/03/2017   Left Lower Shin (mild)   Atypical mole 05/21/2018   Left Deltoid (mild)   Atypical mole 05/21/2018   Left Tricep (moderate to severe)   Cancer (HCC) 2019   skin   Dyspnea    Dysrhythmia    PVCs , Afib   Heart murmur    History of kidney stones    Hyperlipidemia    Hypertension    Hyperthyroidism 08/08/2017   radio active iodine x2   Hypothyroidism    now take synthroid   Lymphedema 12/03/2021   Paroxysmal atrial fibrillation (HCC) 08/09/2015   PONV (postoperative nausea and vomiting)    Superficial basal cell carcinoma (BCC) 12/03/2017   Right Outer Abdomen Mid (curet and 5FU)   Past Surgical History:  Procedure Laterality Date   CARDIOVERSION N/A 08/05/2016   Procedure: CARDIOVERSION;  Surgeon: Chilton Si, MD;  Location: [facility];  Service: Cardiovascular;  Laterality: N/A;   CATARACT EXTRACTION Left 09/2012   Dr. Darcus Pester   LITHOTRIPSY     REPLACEMENT TOTAL KNEE BILATERAL  2011   REVERSE SHOULDER ARTHROPLASTY Left 07/19/2020   Procedure: REVERSE SHOULDER ARTHROPLASTY;  Surgeon: Francena Hanly, MD;  Location: [facility];  Service: Orthopedics;  Laterality: Left;    REVERSE SHOULDER ARTHROPLASTY Right 12/06/2020   Procedure: REVERSE SHOULDER ARTHROPLASTY;  Surgeon: Francena Hanly, MD;   Location: [facility];  Service: Orthopedics;  Laterality: Right;    THYROIDECTOMY  1971   TUBAL LIGATION  1979   Patient Active Problem List   Diagnosis Date Noted   Acute lumbar radiculopathy 07/01/2022   Abnormal [place] (ultrasound) of abdomen 01/06/2022   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   Abnormal liver enzymes 01/06/2022   Lymphedema 12/03/2021   Venous insufficiency of both lower extremities 10/03/2020   S/P reverse total shoulder arthroplasty, 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   Aortic atherosclerosis (HCC), LDL goal < 70 12/26/2019   Palpitations 10/21/2019   Morbid obesity (HCC) 10/21/2019   Encounter for chronic pain management 06/30/2019   Hypothyroidism 09/11/2016   History of atrial fibrillation    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/14/2016   Restrictive lung disease 09/28/2015   Lumbar degenerative disc disease 08/15/2015   PAF (paroxysmal atrial fibrillation) (HCC) 08/09/2015   Chronic allergic rhinitis 05/17/2014   Cataract 06/03/2012   Hyperlipidemia LDL goal <100 12/21/2010   Essential hypertension, goal Less than 130/85 12/21/2010   GERD (gastroesophageal reflux disease) 07/18/2010   Insulin resistance 07/18/2010   Fatty liver 07/18/2010   REFERRING PROVIDER: Everrett Coombe.  REFERRING DIAG: Acute lumbar radiculopathy  Rationale for Evaluation and Treatment: Rehabilitation  THERAPY DIAG:  Other low back pain  Muscle spasm of back  ONSET DATE: ~2 weeks ago.  SUBJECTIVE:                                                                                                                                                                                           SUBJECTIVE STATEMENT: Pain not too bad right now but has been bad. PERTINENT HISTORY:  HTN, bilateral TKA's, left TSA.  PAIN:  Are you having pain? Yes: NPRS scale: 3/10 Pain location: Low back. Pain description: Ache. Aggravating factors: Not sure. Relieving factors: Not sure.  PRECAUTIONS:  None  PATIENT GOALS: Get out of pain.  OBJECTIVE:   PATIENT SURVEYS:  FOTO .  POSTURE: rounded shoulders and forward head  PALPATION: Tender left lower lumbar musculature which is very taut to palpation and also tender over her left SIJ and upper gluteal muscualture.  LUMBAR ROM:  Active lumbar flexion decreased by 25% and extension is 20 degrees.  LOWER EXTREMITY MMT:   Normal LE strength.  LUMBAR SPECIAL TESTS:  Equal leg lengths. (-) SLR testing.  GAIT: Slow and purposeful  TODAY'S TREATMENT:                                                                                                                              DATE: 07/29/22  Nustep level 3 LE's only x 10 minutes f/b instruct in draw-in f/b leg extensions with UE on counter f/b STW/M  x 12 minutes with ischemic release technique utilized to patient's left lumbar musculature.   ASSESSMENT:  CLINICAL IMPRESSION: Patient did well with new interventions of Nustep, draw-in and LE extensions with UE's supported on countertop.  Her left lumbar musculature is very taut to palpation.  She tolerated STW/M without complaint.  OBJECTIVE IMPAIRMENTS: Abnormal gait, decreased activity tolerance, decreased ROM, increased muscle spasms, postural dysfunction, and pain.   ACTIVITY LIMITATIONS: carrying, lifting, bending, and locomotion level  PARTICIPATION LIMITATIONS: meal prep, cleaning, and laundry  REHAB POTENTIAL: Good  CLINICAL DECISION MAKING: Stable/uncomplicated  EVALUATION COMPLEXITY: Low  GOALS: SHORT TERM GOALS: Target date: 07/28/22.  Ind with a HEP. Goal status: INITIAL  LONG TERM GOALS: Target date: 10/12/22  Perform ADL's with pain not > 3/10.  Goal status: INITIAL  2.  Walk a community distance with pain not > 2-3/10.  Goal status: INITIAL  PLAN:  PT FREQUENCY: 2x/week  PT DURATION: 6 weeks  PLANNED INTERVENTIONS: Therapeutic exercises, Therapeutic activity, Neuromuscular re-education, Balance  training, Gait training, Patient/Family education, Self Care, Joint mobilization, Dry Needling, Electrical stimulation, Cryotherapy, Moist heat, Ultrasound, and Manual therapy.  PLAN FOR NEXT SESSION: Modalities and STW/M as needed.  Core exercise progression.  Gavin Telford, [place], PT 07/29/2022, 12:56 PM

## 2022-07-30 ENCOUNTER — Other Ambulatory Visit: Payer: Self-pay

## 2022-07-30 DIAGNOSIS — M5136 Other intervertebral disc degeneration, lumbar region: Secondary | ICD-10-CM

## 2022-07-30 DIAGNOSIS — K760 Fatty (change of) liver, not elsewhere classified: Secondary | ICD-10-CM | POA: Diagnosis not present

## 2022-07-30 DIAGNOSIS — N9089 Other specified noninflammatory disorders of vulva and perineum: Secondary | ICD-10-CM

## 2022-07-30 DIAGNOSIS — K824 Cholesterolosis of gallbladder: Secondary | ICD-10-CM | POA: Diagnosis not present

## 2022-07-30 DIAGNOSIS — G8929 Other chronic pain: Secondary | ICD-10-CM

## 2022-07-30 DIAGNOSIS — I10 Essential (primary) hypertension: Secondary | ICD-10-CM

## 2022-07-30 DIAGNOSIS — R748 Abnormal levels of other serum enzymes: Secondary | ICD-10-CM | POA: Diagnosis not present

## 2022-07-30 MED ORDER — VALSARTAN 320 MG PO TABS
320.0000 mg | ORAL_TABLET | Freq: Every day | ORAL | 0 refills | Status: DC
Start: 2022-07-30 — End: 2022-12-16

## 2022-07-30 MED ORDER — TRIAMCINOLONE ACETONIDE 0.5 % EX OINT
1.0000 | TOPICAL_OINTMENT | Freq: Two times a day (BID) | CUTANEOUS | 3 refills | Status: DC | PRN
Start: 2022-07-30 — End: 2024-01-29

## 2022-07-30 MED ORDER — TRAMADOL HCL 50 MG PO TABS
50.0000 mg | ORAL_TABLET | Freq: Three times a day (TID) | ORAL | 0 refills | Status: DC | PRN
Start: 2022-07-30 — End: 2022-09-08

## 2022-07-30 MED ORDER — SPIRONOLACTONE 25 MG PO TABS
25.0000 mg | ORAL_TABLET | Freq: Every day | ORAL | 0 refills | Status: DC
Start: 2022-07-30 — End: 2022-10-31

## 2022-07-30 NOTE — Telephone Encounter (Signed)
Last fill 06/27/22 last visit 03/13/22 next visit 09/15/22

## 2022-07-31 ENCOUNTER — Encounter: Payer: Self-pay | Admitting: Physical Therapy

## 2022-07-31 ENCOUNTER — Ambulatory Visit: Payer: Medicare Other | Admitting: Physical Therapy

## 2022-07-31 DIAGNOSIS — M5459 Other low back pain: Secondary | ICD-10-CM

## 2022-07-31 DIAGNOSIS — M62830 Muscle spasm of back: Secondary | ICD-10-CM | POA: Diagnosis not present

## 2022-07-31 DIAGNOSIS — M5416 Radiculopathy, lumbar region: Secondary | ICD-10-CM | POA: Diagnosis not present

## 2022-07-31 NOTE — Therapy (Signed)
OUTPATIENT PHYSICAL THERAPY THORACOLUMBAR TREATMENT   Patient Name: Martha Perkins MRN: [id number] DOB:03-29-1945, 77 y.o., female Today's Date: 07/31/2022  END OF SESSION:  PT End of Session - 07/31/22 1347     Visit Number 6    Number of Visits 12    Date for PT Re-Evaluation 10/12/22    PT Start Time 1300    PT Stop Time 1340    PT Time Calculation (min) 40 min    Activity Tolerance Patient tolerated treatment well    Behavior During Therapy [facility] for tasks assessed/performed             Past Medical History:  Diagnosis Date   Arthritis    neck, back, hips, knees, ankles   Asthma    use everyday   Atypical mole 12/03/2017   Left Lower Shin (mild)   Atypical mole 05/21/2018   Left Deltoid (mild)   Atypical mole 05/21/2018   Left Tricep (moderate to severe)   Cancer (HCC) 2019   skin   Dyspnea    Dysrhythmia    PVCs , Afib   Heart murmur    History of kidney stones    Hyperlipidemia    Hypertension    Hyperthyroidism 08/08/2017   radio active iodine x2   Hypothyroidism    now take synthroid   Lymphedema 12/03/2021   Paroxysmal atrial fibrillation (HCC) 08/09/2015   PONV (postoperative nausea and vomiting)    Superficial basal cell carcinoma (BCC) 12/03/2017   Right Outer Abdomen Mid (curet and 5FU)   Past Surgical History:  Procedure Laterality Date   CARDIOVERSION N/A 08/05/2016   Procedure: CARDIOVERSION;  Surgeon: Chilton Si, MD;  Location: [facility];  Service: Cardiovascular;  Laterality: N/A;   CATARACT EXTRACTION Left 09/2012   Dr. Darcus Pester   LITHOTRIPSY     REPLACEMENT TOTAL KNEE BILATERAL  2011   REVERSE SHOULDER ARTHROPLASTY Left 07/19/2020   Procedure: REVERSE SHOULDER ARTHROPLASTY;  Surgeon: Francena Hanly, MD;  Location: [facility];  Service: Orthopedics;  Laterality: Left;    REVERSE SHOULDER ARTHROPLASTY Right 12/06/2020   Procedure: REVERSE SHOULDER ARTHROPLASTY;  Surgeon: Francena Hanly, MD;  Location: [facility];  Service:  Orthopedics;  Laterality: Right;    THYROIDECTOMY  1971   TUBAL LIGATION  1979   Patient Active Problem List   Diagnosis Date Noted   Acute lumbar radiculopathy 07/01/2022   Abnormal [place] (ultrasound) of abdomen 01/06/2022   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   Abnormal liver enzymes 01/06/2022   Lymphedema 12/03/2021   Venous insufficiency of both lower extremities 10/03/2020   S/P reverse total shoulder arthroplasty, 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   Aortic atherosclerosis (HCC), LDL goal < 70 12/26/2019   Palpitations 10/21/2019   Morbid obesity (HCC) 10/21/2019   Encounter for chronic pain management 06/30/2019   Hypothyroidism 09/11/2016   History of atrial fibrillation    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/14/2016   Restrictive lung disease 09/28/2015   Lumbar degenerative disc disease 08/15/2015   PAF (paroxysmal atrial fibrillation) (HCC) 08/09/2015   Chronic allergic rhinitis 05/17/2014   Cataract 06/03/2012   Hyperlipidemia LDL goal <100 12/21/2010   Essential hypertension, goal Less than 130/85 12/21/2010   GERD (gastroesophageal reflux disease) 07/18/2010   Insulin resistance 07/18/2010   Fatty liver 07/18/2010   REFERRING PROVIDER: Everrett Coombe.  REFERRING DIAG: Acute lumbar radiculopathy  Rationale for Evaluation and Treatment: Rehabilitation  THERAPY DIAG:  Other low back pain  Muscle spasm of back  ONSET DATE: ~2 weeks ago.  SUBJECTIVE:                                                                                                                                                                                           SUBJECTIVE STATEMENT: Pt arriving today reporting 5/10 low back pain into left hip.   PERTINENT HISTORY:  HTN, bilateral TKA's, left TSA.  PAIN:  Are you having pain? Yes: NPRS scale: 5/10 Pain location: Low back. Pain description: Ache. Aggravating factors: Not sure. Relieving factors: Not sure.  PRECAUTIONS: None  PATIENT GOALS:  Get out of pain.  OBJECTIVE:   PATIENT SURVEYS:  FOTO .  POSTURE: rounded shoulders and forward head  PALPATION: Tender left lower lumbar musculature which is very taut to palpation and also tender over her left SIJ and upper gluteal muscualture.  LUMBAR ROM:  Active lumbar flexion decreased by 25% and extension is 20 degrees.  LOWER EXTREMITY MMT:   Normal LE strength.  LUMBAR SPECIAL TESTS:  Equal leg lengths. (-) SLR testing.  GAIT: Slow and purposeful  Home Exercise Program:  Access Code: [email or web address] URL: [email or web address] Date: 07/31/2022 Prepared by: Narda Amber  Exercises - Standing Thoracic Extension at Wall  - 2 x daily - 7 x weekly - 5 reps - 5 -10 seconds hold - Standing Hip Extension with Counter Support  - 2 x daily - 7 x weekly - 10 reps - Supine Lower Trunk Rotation  - 2 x daily - 7 x weekly - 3 reps - 20 seconds hold - Seated Hamstring Stretch  - 2 x daily - 7 x weekly - 3 reps - 20 seconds hold  TODAY'S TREATMENT:                                                                                                                              DATE: 07/31/22:  TherEx:  Nustep: level 2 LE's only x 10 minutes Standing trunk extension with elbows on the wall x 5 holding 10 seconds Standing hip extension 2 x 10 each LE Seated clam shells x 15 holding 3 seconds c green  TB Seated hip adduction isometrics (ball squeezes) 3 x 5  seated PPT: x 5 holding 2-3 seconds Seated marching: cues for core activation 2 x 10 Seated LAQ: alternating 2 x 10 Sit to stand from raised mat table 2 x 5,   Pt requiring rest breaks between each exercise set due to COPD, O2 sats 97% on room air, HR ranging from 80-96 during exercise.    07/29/22 Nustep level 3 LE's only x 10 minutes f/b instruct in draw-in f/b leg extensions with UE on counter f/b STW/M  x 12 minutes with ischemic release technique utilized to patient's left lumbar musculature.    ASSESSMENT:  CLINICAL IMPRESSION: Pt with limitations in some exercises due to COPD. Pt having a difficult time tolerating supine position even with head elevated with pillows. Pt was given alternative ways to perform supine based exercises. Pt also showing signs of muscle fatigue during hip isometrics. Pt was issued HEP and pt able to demonstrate each exercise. Continue skilled PT interventions according to pt's tolerance to maximize pt's functional mobility.   OBJECTIVE IMPAIRMENTS: Abnormal gait, decreased activity tolerance, decreased ROM, increased muscle spasms, postural dysfunction, and pain.   ACTIVITY LIMITATIONS: carrying, lifting, bending, and locomotion level  PARTICIPATION LIMITATIONS: meal prep, cleaning, and laundry  REHAB POTENTIAL: Good  CLINICAL DECISION MAKING: Stable/uncomplicated  EVALUATION COMPLEXITY: Low  GOALS: SHORT TERM GOALS: Target date: 07/28/22.  Ind with a HEP. Goal status: INITIAL  LONG TERM GOALS: Target date: 10/12/22  Perform ADL's with pain not > 3/10.  Goal status: INITIAL  2.  Walk a community distance with pain not > 2-3/10.  Goal status: INITIAL  PLAN:  PT FREQUENCY: 2x/week  PT DURATION: 6 weeks  PLANNED INTERVENTIONS: Therapeutic exercises, Therapeutic activity, Neuromuscular re-education, Balance training, Gait training, Patient/Family education, Self Care, Joint mobilization, Dry Needling, Electrical stimulation, Cryotherapy, Moist heat, Ultrasound, and Manual therapy.  PLAN FOR NEXT SESSION: Modalities and STW/M as needed.  Core exercise progression.   Narda Amber, PT, MPT 07/31/22 1:48 PM   MPT 07/31/2022, 1:48 PM

## 2022-08-01 ENCOUNTER — Ambulatory Visit (INDEPENDENT_AMBULATORY_CARE_PROVIDER_SITE_OTHER): Payer: Medicare Other | Admitting: Family Medicine

## 2022-08-01 ENCOUNTER — Encounter: Payer: Self-pay | Admitting: Family Medicine

## 2022-08-01 VITALS — BP 103/54 | HR 93 | Ht 60.0 in | Wt 218.0 lb

## 2022-08-01 DIAGNOSIS — I10 Essential (primary) hypertension: Secondary | ICD-10-CM | POA: Diagnosis not present

## 2022-08-01 DIAGNOSIS — R531 Weakness: Secondary | ICD-10-CM

## 2022-08-01 DIAGNOSIS — R5383 Other fatigue: Secondary | ICD-10-CM

## 2022-08-01 DIAGNOSIS — E118 Type 2 diabetes mellitus with unspecified complications: Secondary | ICD-10-CM

## 2022-08-01 DIAGNOSIS — R0602 Shortness of breath: Secondary | ICD-10-CM

## 2022-08-01 DIAGNOSIS — I890 Lymphedema, not elsewhere classified: Secondary | ICD-10-CM

## 2022-08-01 DIAGNOSIS — I872 Venous insufficiency (chronic) (peripheral): Secondary | ICD-10-CM | POA: Diagnosis not present

## 2022-08-01 LAB — CBC WITH DIFFERENTIAL/PLATELET
Basophils Absolute: 79 cells/uL (ref 0–200)
Eosinophils Relative: 4.2 %
Lymphs Abs: 1787 cells/uL (ref 850–3900)
Monocytes Relative: 11.9 %
Neutro Abs: 3251 cells/uL (ref 1500–7800)
RDW: 12.5 % (ref 11.0–15.0)

## 2022-08-01 MED ORDER — METOPROLOL SUCCINATE ER 50 MG PO TB24: 50.0000 mg | ORAL_TABLET | Freq: Every day | ORAL | 0 refills | Status: DC

## 2022-08-01 NOTE — Progress Notes (Addendum)
Established Patient Office Visit  Subjective   Patient ID: Martha Perkins, female    DOB: 1945/05/20  Age: 77 y.o. MRN: [id number]  Chief Complaint  Patient presents with   Fatigue    HPI  She reports about 2 weeks ago she actually injured her back but has been going to physical therapy and has been getting a little bit better.  She also had a mild cough about 2 weeks ago but did not really feel all that bad.  But then this week she has started to feel extremely fatigued.  She feels like sometimes walking is even an effort she will get a little bit winded.  But the cough is actually better than it was.  No blood in the urine or stool.  No recent thyroid changes she is currently on levothyroxine 25 mcg daily.  She is actually not currently taking her statin.  She has not really had to use her albuterol.  She denies any recent palpitations.  Just had her follow-up with GI and got a pretty good checkup there going to follow her up again in 6 months they just checked her liver function yesterday.  Supposed supposed to follow-up at lymphedema clinic today but could not make both appointments and she has been unable to get the compression stockings on by herself.  She says she really only takes 1 metoprolol daily instead of 2 per her prescription but she is only been taking 1 a day for almost 2 years.  ROS    Objective:     BP (!) 103/54   Pulse 93   Ht 5' (1.524 m)   Wt 218 lb (98.9 kg)   SpO2 98%   BMI 42.58 kg/m    Physical Exam Vitals and nursing note reviewed.  Constitutional:      Appearance: She is well-developed.  HENT:     Head: Normocephalic and atraumatic.     Right Ear: Tympanic membrane, ear canal and external ear normal.     Left Ear: Tympanic membrane, ear canal and external ear normal.     Nose: Nose normal.     Mouth/Throat:     Pharynx: Oropharynx is clear.  Eyes:     Conjunctiva/sclera: Conjunctivae normal.     Pupils: Pupils are equal, round, and  reactive to light.  Neck:     Thyroid: No thyromegaly.  Cardiovascular:     Rate and Rhythm: Normal rate and regular rhythm.     Heart sounds: Normal heart sounds.  Pulmonary:     Effort: Pulmonary effort is normal.     Breath sounds: Normal breath sounds. No wheezing.  Musculoskeletal:     Cervical back: Neck supple.  Lymphadenopathy:     Cervical: No cervical adenopathy.  Skin:    General: Skin is warm and dry.     Comments: Significant edema both lower extremities with some thickening  of the skin anteriorly as well as some small blisters forming.  Neurological:     Mental Status: She is alert and oriented to person, place, and time.  Psychiatric:        Behavior: Behavior normal.      No results found for any visits on 08/01/22.    The 10-year ASCVD risk score (Arnett DK, et al., 2019) is: 13.4%    Assessment & Plan:   Problem List Items Addressed This Visit       Cardiovascular and Mediastinum   Essential hypertension, goal Less than 130/85  Relevant Medications   metoprolol succinate (TOPROL-XL) 50 MG 24 hr tablet   Other Visit Diagnoses     Other fatigue    -  Primary   Relevant Orders   Hemoglobin A1c   TSH   CBC with Differential/Platelet   EKG 12-Lead   Weakness       Relevant Orders   Hemoglobin A1c   TSH   CBC with Differential/Platelet   EKG 12-Lead   SOB (shortness of breath)       Relevant Orders   Hemoglobin A1c   TSH   CBC with Differential/Platelet   EKG 12-Lead   Lymphedema of both lower extremities       Venous stasis dermatitis          Fatigue-she says it feels like when she was first diagnosed with A-fib but she is in sinus rhythm on exam today.  Overall lungs are clear though there is a just slight expiratory wheeze at the left lung base.  Will get some additional labs today including CBC check her thyroid.  It sounds that her liver was checked yesterday so I will repeat that.  Will also check an A1c.  Will do an EKG even  though she is in sinus rhythm and will also do a 6-minute walk test.  EKG today shows rate of 81 bpm, normal sinus rhythm with no acute ST-T wave changes.  She has some low voltage in lead III but it is not new compared to prior EKG from September 2023.  Unna boots placed on both feet to add some compressions I do not want the blisters to pop and get infected and she is not able to put the compression stockings on her own and she unfortunately missed her lymphedema clinic appointment.  OK to cut Diovan in half for the weekend and see if feels better.   No follow-ups on file.    Nani Gasser, MD

## 2022-08-01 NOTE — Addendum Note (Signed)
Addended by: Nani Gasser D on: 08/01/2022 04:54 PM   Modules accepted: Orders

## 2022-08-01 NOTE — Patient Instructions (Signed)
OK to cut Diovan in half for the weekend and see if feels better.

## 2022-08-01 NOTE — Progress Notes (Signed)
Pt reports that she has been feeling tired and fatigued for the past week. Denied  any f/s/c/n/v

## 2022-08-02 LAB — CBC WITH DIFFERENTIAL/PLATELET
Absolute Monocytes: 726 cells/uL (ref 200–950)
Basophils Relative: 1.3 %
Eosinophils Absolute: 256 cells/uL (ref 15–500)
HCT: 41 % (ref 35.0–45.0)
Hemoglobin: 14 g/dL (ref 11.7–15.5)
MCH: 31 pg (ref 27.0–33.0)
MCHC: 34.1 g/dL (ref 32.0–36.0)
MCV: 90.7 fL (ref 80.0–100.0)
MPV: 11.3 fL (ref 7.5–12.5)
Neutrophils Relative %: 53.3 %
Platelets: 221 10*3/uL (ref 140–400)
RBC: 4.52 10*6/uL (ref 3.80–5.10)
Total Lymphocyte: 29.3 %
WBC: 6.1 10*3/uL (ref 3.8–10.8)

## 2022-08-02 LAB — HEMOGLOBIN A1C
Hgb A1c MFr Bld: 6.7 % of total Hgb — ABNORMAL HIGH (ref <5.7)
Mean Plasma Glucose: 146 mg/dL
eAG (mmol/L): 8.1 mmol/L

## 2022-08-02 LAB — TSH: TSH: 1.68 mIU/L (ref 0.40–4.50)

## 2022-08-05 ENCOUNTER — Ambulatory Visit (INDEPENDENT_AMBULATORY_CARE_PROVIDER_SITE_OTHER): Payer: Medicare Other | Admitting: Family Medicine

## 2022-08-05 VITALS — BP 130/59 | HR 86

## 2022-08-05 DIAGNOSIS — R600 Localized edema: Secondary | ICD-10-CM

## 2022-08-05 DIAGNOSIS — R7309 Other abnormal glucose: Secondary | ICD-10-CM

## 2022-08-05 DIAGNOSIS — E88819 Insulin resistance, unspecified: Secondary | ICD-10-CM

## 2022-08-05 MED ORDER — METFORMIN HCL ER 500 MG PO TB24
500.0000 mg | ORAL_TABLET | Freq: Two times a day (BID) | ORAL | 1 refills | Status: DC
Start: 2022-08-05 — End: 2022-08-05

## 2022-08-05 NOTE — Progress Notes (Signed)
   Acute Office Visit  Subjective:     Patient ID: Martha Perkins, female    DOB: 02-22-1946, 77 y.o.   MRN: [id number]  Chief Complaint  Patient presents with   Hyperglycemia   Wound Check    HPI Patient is in today for follow-up lower leg wound check.  She is doing well she feels like her legs look better and the swelling is improved the erythema has improved as well.  Has been moisturizing and well with Eucerin.  A1C went up to 6.7.  send note about starting metformin but she really wants to work on diet and exercise first.    ROS      Objective:    BP (!) 130/59   Pulse 86   SpO2 97%    Physical Exam Skin:    Comments: He still has some chronic swelling related to her lymphedema.  But some of the extra swelling has improved. skin over anterior shin is  erythematous with some fine maculopapular bumps, no vesicles.      No results found for any visits on 08/05/22.      Assessment & Plan:   Problem List Items Addressed This Visit       Endocrine   Insulin resistance    Okay to work on healthy diet and regular exercise over the next 90 days but if her A1c does not improve then we will start metformin.      Other Visit Diagnoses     Bilateral lower extremity edema    -  Primary   Elevated glucose           Lower Extremity edema-much improved.  No open wounds today.  We did discuss rewrapping if needed.  She does have follow-up with lymphedema clinic in about a week.  No orders of the defined types were placed in this encounter.   No follow-ups on file.  Nani Gasser, MD

## 2022-08-05 NOTE — Progress Notes (Signed)
Martha Perkins, hemoglobin A1c has trended up a year ago it was 5.8 and then 6.2 and now 6.7 which is in the full diabetes range.  I would like to put you on metformin and see if this helps lower your A1c.  Anything greater than 6.4 is considered diabetes.  Plan would be to follow back up in about 3 months so we can see how well this is working. Sent to [employer]

## 2022-08-05 NOTE — Addendum Note (Signed)
Addended by: Nani Gasser D on: 08/05/2022 07:55 AM   Modules accepted: Orders

## 2022-08-05 NOTE — Assessment & Plan Note (Signed)
Okay to work on [employer] and regular exercise over the next 90 days but if her A1c does not improve then we will start metformin.

## 2022-08-06 ENCOUNTER — Ambulatory Visit: Payer: Medicare Other

## 2022-08-07 ENCOUNTER — Encounter: Payer: Self-pay | Admitting: Physical Therapy

## 2022-08-07 ENCOUNTER — Ambulatory Visit: Payer: Medicare Other | Admitting: Physical Therapy

## 2022-08-07 DIAGNOSIS — M5459 Other low back pain: Secondary | ICD-10-CM | POA: Diagnosis not present

## 2022-08-07 DIAGNOSIS — M62830 Muscle spasm of back: Secondary | ICD-10-CM

## 2022-08-07 DIAGNOSIS — M5416 Radiculopathy, lumbar region: Secondary | ICD-10-CM | POA: Diagnosis not present

## 2022-08-07 NOTE — Therapy (Signed)
OUTPATIENT PHYSICAL THERAPY THORACOLUMBAR TREATMENT   Patient Name: Martha Perkins MRN: [id number] DOB:11-28-1945, 77 y.o., female Today's Date: 08/07/2022  END OF SESSION:  PT End of Session - 08/07/22 1302     Visit Number 7    Number of Visits 12    Date for PT Re-Evaluation 10/12/22    PT Start Time 1303    PT Stop Time 1351    PT Time Calculation (min) 48 min    Activity Tolerance Patient tolerated treatment well    Behavior During Therapy [facility] for tasks assessed/performed            Past Medical History:  Diagnosis Date   Arthritis    neck, back, hips, knees, ankles   Asthma    use everyday   Atypical mole 12/03/2017   Left Lower Shin (mild)   Atypical mole 05/21/2018   Left Deltoid (mild)   Atypical mole 05/21/2018   Left Tricep (moderate to severe)   Cancer (HCC) 2019   skin   Dyspnea    Dysrhythmia    PVCs , Afib   Heart murmur    History of kidney stones    Hyperlipidemia    Hypertension    Hyperthyroidism 08/08/2017   radio active iodine x2   Hypothyroidism    now take synthroid   Lymphedema 12/03/2021   Paroxysmal atrial fibrillation (HCC) 08/09/2015   PONV (postoperative nausea and vomiting)    Superficial basal cell carcinoma (BCC) 12/03/2017   Right Outer Abdomen Mid (curet and 5FU)   Past Surgical History:  Procedure Laterality Date   CARDIOVERSION N/A 08/05/2016   Procedure: CARDIOVERSION;  Surgeon: Chilton Si, MD;  Location: [facility] ENDOSCOPY;  Service: Cardiovascular;  Laterality: N/A;   CATARACT EXTRACTION Left 09/2012   Dr. Darcus Pester   LITHOTRIPSY     REPLACEMENT TOTAL KNEE BILATERAL  2011   REVERSE SHOULDER ARTHROPLASTY Left 07/19/2020   Procedure: REVERSE SHOULDER ARTHROPLASTY;  Surgeon: Francena Hanly, MD;  Location: [facility] ORS;  Service: Orthopedics;  Laterality: Left;    REVERSE SHOULDER ARTHROPLASTY Right 12/06/2020   Procedure: REVERSE SHOULDER ARTHROPLASTY;  Surgeon: Francena Hanly, MD;  Location: [facility]S;  Service:  Orthopedics;  Laterality: Right;    THYROIDECTOMY  1971   TUBAL LIGATION  1979   Patient Active Problem List   Diagnosis Date Noted   Acute lumbar radiculopathy 07/01/2022   Abnormal [place] (ultrasound) of abdomen 01/06/2022   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   Abnormal liver enzymes 01/06/2022   Lymphedema 12/03/2021   Venous insufficiency of both lower extremities 10/03/2020   S/P reverse total shoulder arthroplasty, 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   Aortic atherosclerosis (HCC), LDL goal < 70 12/26/2019   Palpitations 10/21/2019   Morbid obesity (HCC) 10/21/2019   Encounter for chronic pain management 06/30/2019   Hypothyroidism 09/11/2016   History of atrial fibrillation    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/14/2016   Restrictive lung disease 09/28/2015   Lumbar degenerative disc disease 08/15/2015   PAF (paroxysmal atrial fibrillation) (HCC) 08/09/2015   Chronic allergic rhinitis 05/17/2014   Cataract 06/03/2012   Hyperlipidemia LDL goal <100 12/21/2010   Essential hypertension, goal Less than 130/85 12/21/2010   GERD (gastroesophageal reflux disease) 07/18/2010   Insulin resistance 07/18/2010   Fatty liver 07/18/2010   REFERRING PROVIDER: Everrett Coombe.  REFERRING DIAG: Acute lumbar radiculopathy  Rationale for Evaluation and Treatment: Rehabilitation  THERAPY DIAG:  Other low back pain  Muscle spasm of back  ONSET DATE: ~2 weeks ago.  SUBJECTIVE:  SUBJECTIVE STATEMENT: Patient reports pain is still present 5/10 in L hip.  PERTINENT HISTORY:  HTN, bilateral TKA's, left TSA.  PAIN:  Are you having pain? Yes: NPRS scale: 5/10 Pain location: Low back. Pain description: Ache. Aggravating factors: Not sure. Relieving factors: Not sure.  PRECAUTIONS: None  PATIENT GOALS: Get out  of pain.  OBJECTIVE:   PATIENT SURVEYS:  FOTO    POSTURE: rounded shoulders and forward head  PALPATION: Tender left lower lumbar musculature which is very taut to palpation and also tender over her left SIJ and upper gluteal muscualture.  LUMBAR ROM:  Active lumbar flexion decreased by 25% and extension is 20 degrees.  LOWER EXTREMITY MMT:   Normal LE strength.  LUMBAR SPECIAL TESTS:  Equal leg lengths. (-) SLR testing.  GAIT: Slow and purposeful  Home Exercise Program:  Access Code: [email or web address] URL: [email or web address] Date: 07/31/2022 Prepared by: Narda Amber  Exercises - Standing Thoracic Extension at Wall  - 2 x daily - 7 x weekly - 5 reps - 5 -10 seconds hold - Standing Hip Extension with Counter Support  - 2 x daily - 7 x weekly - 10 reps - Supine Lower Trunk Rotation  - 2 x daily - 7 x weekly - 3 reps - 20 seconds hold - Seated Hamstring Stretch  - 2 x daily - 7 x weekly - 3 reps - 20 seconds hold  TODAY'S TREATMENT:                                                                                                                              DATE: 5/30  EXERCISE LOG  Exercise Repetitions and Resistance Comments  Nustep L3 x10 min   Ball squeeze X20 reps   Seated clam Green theraband x30 reps            Blank cell = exercise not performed today   Manual Therapy Soft Tissue Mobilization: L glute, to reduce tone    Modalities  Date: 08/07/22 Unattended Estim: Hip, Pre-Mod, 15 mins, Pain and Tone  ASSESSMENT: CLINICAL IMPRESSION: Patient presented in clinic with reports of moderate glute related pain. Patient guided through light activity and hip strengthening for LBP/ SI pain. Patient slow with transfers due to discomfort. Patient presented from mod L glute/ piriformis tone with tenderness reported in superior glute. Normal stimulation response noted following removal of the modality.  OBJECTIVE IMPAIRMENTS: Abnormal gait, decreased  activity tolerance, decreased ROM, increased muscle spasms, postural dysfunction, and pain.   ACTIVITY LIMITATIONS: carrying, lifting, bending, and locomotion level  PARTICIPATION LIMITATIONS: meal prep, cleaning, and laundry  REHAB POTENTIAL: Good  CLINICAL DECISION MAKING: Stable/uncomplicated  EVALUATION COMPLEXITY: Low  GOALS: SHORT TERM GOALS: Target date: 07/28/22.  Ind with a HEP. Goal status: On-going  LONG TERM GOALS: Target date: 10/12/22  Perform ADL's with pain not > 3/10.  Goal status: On-going  2.  Walk a community distance with pain not > 2-3/10.  Goal status:  On-going  PLAN:  PT FREQUENCY: 2x/week  PT DURATION: 6 weeks  PLANNED INTERVENTIONS: Therapeutic exercises, Therapeutic activity, Neuromuscular re-education, Balance training, Gait training, Patient/Family education, Self Care, Joint mobilization, Dry Needling, Electrical stimulation, Cryotherapy, Moist heat, Ultrasound, and Manual therapy.  PLAN FOR NEXT SESSION: Modalities and STW/M as needed.  Core exercise progression.  Marvell Fuller, PTA 08/07/22 2:56 PM

## 2022-08-14 ENCOUNTER — Ambulatory Visit: Payer: Medicare Other | Attending: Family Medicine | Admitting: Physical Therapy

## 2022-08-14 DIAGNOSIS — M5459 Other low back pain: Secondary | ICD-10-CM | POA: Diagnosis not present

## 2022-08-14 DIAGNOSIS — M62830 Muscle spasm of back: Secondary | ICD-10-CM | POA: Insufficient documentation

## 2022-08-14 NOTE — Therapy (Signed)
OUTPATIENT PHYSICAL THERAPY THORACOLUMBAR TREATMENT   Patient Name: Martha Perkins MRN: [id number] DOB:August 27, 1945, 77 y.o., female Today's Date: 08/14/2022  END OF SESSION:  PT End of Session - 08/14/22 1340     Visit Number 8    Number of Visits 12    Date for PT Re-Evaluation 10/12/22    Authorization Type FOTO.    PT Start Time 1256    PT Stop Time 1353    PT Time Calculation (min) 57 min    Activity Tolerance Patient tolerated treatment well    Behavior During Therapy WFL for tasks assessed/performed            Past Medical History:  Diagnosis Date   Arthritis    neck, back, hips, knees, ankles   Asthma    use everyday   Atypical mole 12/03/2017   Left Lower Shin (mild)   Atypical mole 05/21/2018   Left Deltoid (mild)   Atypical mole 05/21/2018   Left Tricep (moderate to severe)   Cancer (HCC) 2019   skin   Dyspnea    Dysrhythmia    PVCs , Afib   Heart murmur    History of kidney stones    Hyperlipidemia    Hypertension    Hyperthyroidism 08/08/2017   radio active iodine x2   Hypothyroidism    now take synthroid   Lymphedema 12/03/2021   Paroxysmal atrial fibrillation (HCC) 08/09/2015   PONV (postoperative nausea and vomiting)    Superficial basal cell carcinoma (BCC) 12/03/2017   Right Outer Abdomen Mid (curet and 5FU)   Past Surgical History:  Procedure Laterality Date   CARDIOVERSION N/A 08/05/2016   Procedure: CARDIOVERSION;  Surgeon: Chilton Si, MD;  Location: [facility] ENDOSCOPY;  Service: Cardiovascular;  Laterality: N/A;   CATARACT EXTRACTION Left 09/2012   Dr. Darcus Pester   LITHOTRIPSY     REPLACEMENT TOTAL KNEE BILATERAL  2011   REVERSE SHOULDER ARTHROPLASTY Left 07/19/2020   Procedure: REVERSE SHOULDER ARTHROPLASTY;  Surgeon: Francena Hanly, MD;  Location: [facility];  Service: Orthopedics;  Laterality: Left;    REVERSE SHOULDER ARTHROPLASTY Right 12/06/2020   Procedure: REVERSE SHOULDER ARTHROPLASTY;  Surgeon: Francena Hanly, MD;   Location: [facility];  Service: Orthopedics;  Laterality: Right;    THYROIDECTOMY  1971   TUBAL LIGATION  1979   Patient Active Problem List   Diagnosis Date Noted   Acute lumbar radiculopathy 07/01/2022   Abnormal [place] (ultrasound) of abdomen 01/06/2022   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   Abnormal liver enzymes 01/06/2022   Lymphedema 12/03/2021   Venous insufficiency of both lower extremities 10/03/2020   S/P reverse total shoulder arthroplasty, 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   Aortic atherosclerosis (HCC), LDL goal < 70 12/26/2019   Palpitations 10/21/2019   Morbid obesity (HCC) 10/21/2019   Encounter for chronic pain management 06/30/2019   Hypothyroidism 09/11/2016   History of atrial fibrillation    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/14/2016   Restrictive lung disease 09/28/2015   Lumbar degenerative disc disease 08/15/2015   PAF (paroxysmal atrial fibrillation) (HCC) 08/09/2015   Chronic allergic rhinitis 05/17/2014   Cataract 06/03/2012   Hyperlipidemia LDL goal <100 12/21/2010   Essential hypertension, goal Less than 130/85 12/21/2010   GERD (gastroesophageal reflux disease) 07/18/2010   Insulin resistance 07/18/2010   Fatty liver 07/18/2010   REFERRING PROVIDER: Everrett Coombe.  REFERRING DIAG: Acute lumbar radiculopathy  Rationale for Evaluation and Treatment: Rehabilitation  THERAPY DIAG:  Other low back pain  Muscle spasm of back  ONSET  DATE: ~2 weeks ago.  SUBJECTIVE:                                                                                                                                                                                           SUBJECTIVE STATEMENT: Patient reports pain is still present 2-3/10 in L hip.  May make today my last visit as I am getting treatment for lymphedema and want to be mindful of visits.  PERTINENT HISTORY:  HTN, bilateral TKA's, left TSA.  PAIN:  Are you having pain? Yes: NPRS scale: 2-3/10 Pain location: Left  glut. Pain description: Ache. Aggravating factors: Not sure. Relieving factors: Not sure.  PRECAUTIONS: None  PATIENT GOALS: Get out of pain.  OBJECTIVE:   PATIENT SURVEYS:  FOTO    POSTURE: rounded shoulders and forward head  PALPATION: Tender left lower lumbar musculature which is very taut to palpation and also tender over her left SIJ and upper gluteal muscualture.  LUMBAR ROM:  Active lumbar flexion decreased by 25% and extension is 20 degrees.  LOWER EXTREMITY MMT:   Normal LE strength.  LUMBAR SPECIAL TESTS:  Equal leg lengths. (-) SLR testing.  GAIT: Slow and purposeful    TODAY'S TREATMENT:                                                                                                                              DATE: 08/14/22  EXERCISE LOG  Exercise Repetitions and Resistance Comments  Nustep L3 x10 min                   Trigger Point Dry-Needling  Treatment instructions: Expect mild to moderate muscle soreness. S/S of pneumothorax if dry needled over a lung field, and to seek immediate medical attention should they occur. Patient verbalized understanding of these instructions and education. Patient Consent Given: Yes Education handout provided: Yes Muscles treated: Left upper glut  F/b STW/M x 13 minutes f/b IFC at 80-150 Hz on 40% scan x 20 minutes.    Normal modality resposne following removal of modality.   ASSESSMENT: CLINICAL IMPRESSION:  Patient with a very good response to treatment today and felt very good following.  She has continued tenderness over her left upper gluteal region.  Will place treatments on hold at this time.  OBJECTIVE IMPAIRMENTS: Abnormal gait, decreased activity tolerance, decreased ROM, increased muscle spasms, postural dysfunction, and pain.   ACTIVITY LIMITATIONS: carrying, lifting, bending, and locomotion level  PARTICIPATION LIMITATIONS: meal prep, cleaning, and laundry  REHAB POTENTIAL: Good  CLINICAL DECISION  MAKING: Stable/uncomplicated  EVALUATION COMPLEXITY: Low  GOALS: SHORT TERM GOALS: Target date: 07/28/22.  Ind with a HEP. Goal status: On-going  LONG TERM GOALS: Target date: 10/12/22  Perform ADL's with pain not > 3/10.  Goal status: On-going  2.  Walk a community distance with pain not > 2-3/10.  Goal status: On-going  PLAN:  PT FREQUENCY: 2x/week  PT DURATION: 6 weeks  PLANNED INTERVENTIONS: Therapeutic exercises, Therapeutic activity, Neuromuscular re-education, Balance training, Gait training, Patient/Family education, Self Care, Joint mobilization, Dry Needling, Electrical stimulation, Cryotherapy, Moist heat, Ultrasound, and Manual therapy.  PLAN FOR NEXT SESSION: Modalities and STW/M as needed.  Core exercise progression.   [place] Janin Kozlowski MPT

## 2022-08-15 DIAGNOSIS — I872 Venous insufficiency (chronic) (peripheral): Secondary | ICD-10-CM | POA: Diagnosis not present

## 2022-08-15 DIAGNOSIS — I890 Lymphedema, not elsewhere classified: Secondary | ICD-10-CM | POA: Diagnosis not present

## 2022-09-01 ENCOUNTER — Other Ambulatory Visit: Payer: Self-pay | Admitting: Family Medicine

## 2022-09-01 MED ORDER — ATORVASTATIN CALCIUM 40 MG PO TABS: 40.0000 mg | ORAL_TABLET | Freq: Every day | ORAL | 3 refills | Status: DC

## 2022-09-08 ENCOUNTER — Other Ambulatory Visit: Payer: Self-pay | Admitting: Family Medicine

## 2022-09-08 DIAGNOSIS — G8929 Other chronic pain: Secondary | ICD-10-CM

## 2022-09-08 DIAGNOSIS — M5136 Other intervertebral disc degeneration, lumbar region: Secondary | ICD-10-CM

## 2022-09-10 ENCOUNTER — Ambulatory Visit (INDEPENDENT_AMBULATORY_CARE_PROVIDER_SITE_OTHER): Payer: Medicare Other | Admitting: Family Medicine

## 2022-09-10 VITALS — BP 114/69 | HR 69 | Ht 60.0 in | Wt 218.0 lb

## 2022-09-10 DIAGNOSIS — R600 Localized edema: Secondary | ICD-10-CM | POA: Diagnosis not present

## 2022-09-10 DIAGNOSIS — I890 Lymphedema, not elsewhere classified: Secondary | ICD-10-CM | POA: Insufficient documentation

## 2022-09-10 NOTE — Progress Notes (Unsigned)
   Established Patient Office Visit  Subjective   Patient ID: Martha Perkins, female    DOB: 10-Jun-1945  Age: 77 y.o. MRN: [id number]  Chief Complaint  Patient presents with   unna boot placement bilateral - nurse visit.     Unna boot placement bilateral nurse visit.     HPI  Unna boot placement bilaterally- nurse visit. - patient presents with bilateral lower extremity edema, redness and some raised lesions without any open wounds or any drainage. Patient last visit 08/05/22 in notes to just call when needing legs wrapped. Approval given by Everrett Coombe to administer bilateral unna boots today.    ROS    Objective:     BP 114/69   Pulse 69   Ht 5' (1.524 m)   Wt 218 lb (98.9 kg)   SpO2 97%   BMI 42.58 kg/m  {Vitals History (Optional):23777}  Physical Exam   No results found for any visits on 09/10/22.  {Labs (Optional):23779}  The 10-year ASCVD risk score (Arnett DK, et al., 2019) is: 29%    Assessment & Plan:  Unna boots placed bilaterally. Per Everrett Coombe patient instructed to remove Unna boot on Monday 09/15/22 or Tuesday 09/16/22 of next week.  Patient instructed to schedule a follow up visit with Dr. Linford Arnold at her earliest convenience.  Problem List Items Addressed This Visit   None   No follow-ups on file.    Elizabeth Palau, LPN

## 2022-09-11 NOTE — Progress Notes (Signed)
Medical screening examination/treatment was performed by qualified clinical staff member and as supervising physician I was immediately available for consultation/collaboration. I have reviewed documentation and agree with assessment and plan.  Alp Goldwater, DO  

## 2022-09-15 ENCOUNTER — Ambulatory Visit: Payer: Medicare Other | Admitting: Family Medicine

## 2022-09-17 ENCOUNTER — Ambulatory Visit: Payer: Medicare Other | Attending: Family Medicine | Admitting: Physical Therapy

## 2022-09-17 DIAGNOSIS — M62830 Muscle spasm of back: Secondary | ICD-10-CM | POA: Insufficient documentation

## 2022-09-17 DIAGNOSIS — M5459 Other low back pain: Secondary | ICD-10-CM | POA: Diagnosis not present

## 2022-09-17 NOTE — Therapy (Signed)
OUTPATIENT PHYSICAL THERAPY THORACOLUMBAR TREATMENT   Patient Name: Martha Perkins MRN: [id number] DOB:[birth year], 77 y.o., female Today's Date: 09/17/2022  END OF SESSION:  PT End of Session - 09/17/22 1024     Visit Number 9    Number of Visits 12    Date for PT Re-Evaluation 10/12/22    Authorization Type FOTO.    PT Start Time 1017    PT Stop Time 1115    PT Time Calculation (min) 58 min    Activity Tolerance Patient tolerated treatment well    Behavior During Therapy WFL for tasks assessed/performed            Past Medical History:  Diagnosis Date   Arthritis    neck, back, hips, knees, ankles   Asthma    use everyday   Atypical mole 12/03/2017   Left Lower Shin (mild)   Atypical mole 05/21/2018   Left Deltoid (mild)   Atypical mole 05/21/2018   Left Tricep (moderate to severe)   Cancer (HCC) 2019   skin   Dyspnea    Dysrhythmia    PVCs , Afib   Heart murmur    History of kidney stones    Hyperlipidemia    Hypertension    Hyperthyroidism 08/08/2017   radio active iodine x2   Hypothyroidism    now take synthroid   Lymphedema 12/03/2021   Paroxysmal atrial fibrillation (HCC) 08/09/2015   PONV (postoperative nausea and vomiting)    Superficial basal cell carcinoma (BCC) 12/03/2017   Right Outer Abdomen Mid (curet and 5FU)   Past Surgical History:  Procedure Laterality Date   CARDIOVERSION N/A 08/05/2016   Procedure: CARDIOVERSION;  Surgeon: Chilton Si, MD;  Location: [facility] ENDOSCOPY;  Service: Cardiovascular;  Laterality: N/A;   CATARACT EXTRACTION Left 09/2012   Dr. Darcus Pester   LITHOTRIPSY     REPLACEMENT TOTAL KNEE BILATERAL  2011   REVERSE SHOULDER ARTHROPLASTY Left 07/19/2020   Procedure: REVERSE SHOULDER ARTHROPLASTY;  Surgeon: Francena Hanly, MD;  Location: [facility];  Service: Orthopedics;  Laterality: Left;    REVERSE SHOULDER ARTHROPLASTY Right 12/06/2020   Procedure: REVERSE SHOULDER ARTHROPLASTY;  Surgeon: Francena Hanly, MD;   Location: [facility];  Service: Orthopedics;  Laterality: Right;    THYROIDECTOMY  1971   TUBAL LIGATION  1979   Patient Active Problem List   Diagnosis Date Noted   Bilateral lower extremity edema 09/10/2022   Acute lumbar radiculopathy 07/01/2022   Abnormal [place] (ultrasound) of abdomen 01/06/2022   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   Abnormal liver enzymes 01/06/2022   Lymphedema 12/03/2021   Venous insufficiency of both lower extremities 10/03/2020   S/P reverse total shoulder arthroplasty, 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   Aortic atherosclerosis (HCC), LDL goal < 70 12/26/2019   Palpitations 10/21/2019   Morbid obesity (HCC) 10/21/2019   Encounter for chronic pain management 06/30/2019   Hypothyroidism 09/11/2016   History of atrial fibrillation    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/14/2016   Restrictive lung disease 09/28/2015   Lumbar degenerative disc disease 08/15/2015   PAF (paroxysmal atrial fibrillation) (HCC) 08/09/2015   Chronic allergic rhinitis 05/17/2014   Cataract 06/03/2012   Hyperlipidemia LDL goal <100 12/21/2010   Essential hypertension, goal Less than 130/85 12/21/2010   GERD (gastroesophageal reflux disease) 07/18/2010   Insulin resistance 07/18/2010   Fatty liver 07/18/2010   REFERRING PROVIDER: Everrett Coombe.  REFERRING DIAG: Acute lumbar radiculopathy  Rationale for Evaluation and Treatment: Rehabilitation  THERAPY DIAG:  Other low back pain  Muscle spasm of back  ONSET DATE: ~2 weeks ago.  SUBJECTIVE:                                                                                                                                                                                           SUBJECTIVE STATEMENT: Pain in left low back and buttock up to 7/10 since being out of PT which patient relates to her lymphedema in which she has to sit for long periods of time.  PERTINENT HISTORY:  HTN, bilateral TKA's, left TSA.  PAIN:  Are you having pain? Yes:  NPRS scale: 7/10 Pain location: Left glut. Pain description: Ache. Aggravating factors: Not sure. Relieving factors: Not sure.  PRECAUTIONS: None  PATIENT GOALS: Get out of pain.  OBJECTIVE:   PATIENT SURVEYS:  FOTO    POSTURE: rounded shoulders and forward head  PALPATION: Tender left lower lumbar musculature which is very taut to palpation and also tender over her left SIJ and upper gluteal muscualture.  LUMBAR ROM:  Active lumbar flexion decreased by 25% and extension is 20 degrees.  LOWER EXTREMITY MMT:   Normal LE strength.  LUMBAR SPECIAL TESTS:  Equal leg lengths. (-) SLR testing.  GAIT: Slow and purposeful    TODAY'S TREATMENT:                                                                                                                              DATE: 09/17/22  EXERCISE LOG  Exercise Repetitions and Resistance Comments  Nustep L3 x10 min                   Patient in right SDLY position with folded pillow between knees for comfort;  Perform STW/M x 15 minutes to patient's left affected hip and gluteal region f/b HMP and IFC at 80-150 Hz on 40% scan x 20 minutes.  Normal modality response following removal of modality.   ASSESSMENT: CLINICAL IMPRESSION: Patient with increased pain since being out of PT which she relates to having to sit a lot due to her LE lymphedema.  She  had a great deal of tenderness in her left hip and buttock region.  She had a good response to STW/M and felt significantly better after treatment.    OBJECTIVE IMPAIRMENTS: Abnormal gait, decreased activity tolerance, decreased ROM, increased muscle spasms, postural dysfunction, and pain.   ACTIVITY LIMITATIONS: carrying, lifting, bending, and locomotion level  PARTICIPATION LIMITATIONS: meal prep, cleaning, and laundry  REHAB POTENTIAL: Good  CLINICAL DECISION MAKING: Stable/uncomplicated  EVALUATION COMPLEXITY: Low  GOALS: SHORT TERM GOALS: Target date: 07/28/22.  Ind with  a HEP. Goal status: On-going  LONG TERM GOALS: Target date: 10/12/22  Perform ADL's with pain not > 3/10.  Goal status: On-going  2.  Walk a community distance with pain not > 2-3/10.  Goal status: On-going  PLAN:  PT FREQUENCY: 2x/week  PT DURATION: 6 weeks  PLANNED INTERVENTIONS: Therapeutic exercises, Therapeutic activity, Neuromuscular re-education, Balance training, Gait training, Patient/Family education, Self Care, Joint mobilization, Dry Needling, Electrical stimulation, Cryotherapy, Moist heat, Ultrasound, and Manual therapy.  PLAN FOR NEXT SESSION: Modalities and STW/M as needed.  Core exercise progression.   [place] Jenin Birdsall MPT

## 2022-09-25 ENCOUNTER — Ambulatory Visit: Payer: Medicare Other | Admitting: Physical Therapy

## 2022-09-25 DIAGNOSIS — M5459 Other low back pain: Secondary | ICD-10-CM | POA: Diagnosis not present

## 2022-09-25 DIAGNOSIS — M62830 Muscle spasm of back: Secondary | ICD-10-CM

## 2022-09-25 NOTE — Therapy (Signed)
OUTPATIENT PHYSICAL THERAPY THORACOLUMBAR TREATMENT   Patient Name: Martha Perkins MRN: [id number] DOB:05/05/1945, 77 y.o., female Today's Date: 09/25/2022  END OF SESSION:  PT End of Session - 09/25/22 1151     Visit Number 10    Number of Visits 12    Date for PT Re-Evaluation 10/12/22    Authorization Type FOTO.    PT Start Time 1100    PT Stop Time 1157    PT Time Calculation (min) 57 min    Activity Tolerance Patient tolerated treatment well    Behavior During Therapy WFL for tasks assessed/performed            Past Medical History:  Diagnosis Date   Arthritis    neck, back, hips, knees, ankles   Asthma    use everyday   Atypical mole 12/03/2017   Left Lower Shin (mild)   Atypical mole 05/21/2018   Left Deltoid (mild)   Atypical mole 05/21/2018   Left Tricep (moderate to severe)   Cancer (HCC) 2019   skin   Dyspnea    Dysrhythmia    PVCs , Afib   Heart murmur    History of kidney stones    Hyperlipidemia    Hypertension    Hyperthyroidism 08/08/2017   radio active iodine x2   Hypothyroidism    now take synthroid   Lymphedema 12/03/2021   Paroxysmal atrial fibrillation (HCC) 08/09/2015   PONV (postoperative nausea and vomiting)    Superficial basal cell carcinoma (BCC) 12/03/2017   Right Outer Abdomen Mid (curet and 5FU)   Past Surgical History:  Procedure Laterality Date   CARDIOVERSION N/A 08/05/2016   Procedure: CARDIOVERSION;  Surgeon: Chilton Si, MD;  Location: [facility] ENDOSCOPY;  Service: Cardiovascular;  Laterality: N/A;   CATARACT EXTRACTION Left 09/2012   Dr. Darcus Pester   LITHOTRIPSY     REPLACEMENT TOTAL KNEE BILATERAL  2011   REVERSE SHOULDER ARTHROPLASTY Left 07/19/2020   Procedure: REVERSE SHOULDER ARTHROPLASTY;  Surgeon: Francena Hanly, MD;  Location: [facility] ORS;  Service: Orthopedics;  Laterality: Left;    REVERSE SHOULDER ARTHROPLASTY Right 12/06/2020   Procedure: REVERSE SHOULDER ARTHROPLASTY;  Surgeon: Francena Hanly, MD;   Location: [facility] ORS;  Service: Orthopedics;  Laterality: Right;    THYROIDECTOMY  1971   TUBAL LIGATION  1979   Patient Active Problem List   Diagnosis Date Noted   Bilateral lower extremity edema 09/10/2022   Acute lumbar radiculopathy 07/01/2022   Abnormal [place] (ultrasound) of abdomen 01/06/2022   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   Abnormal liver enzymes 01/06/2022   Lymphedema 12/03/2021   Venous insufficiency of both lower extremities 10/03/2020   S/P reverse total shoulder arthroplasty, 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   Aortic atherosclerosis (HCC), LDL goal < 70 12/26/2019   Palpitations 10/21/2019   Morbid obesity (HCC) 10/21/2019   Encounter for chronic pain management 06/30/2019   Hypothyroidism 09/11/2016   History of atrial fibrillation    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/14/2016   Restrictive lung disease 09/28/2015   Lumbar degenerative disc disease 08/15/2015   PAF (paroxysmal atrial fibrillation) (HCC) 08/09/2015   Chronic allergic rhinitis 05/17/2014   Cataract 06/03/2012   Hyperlipidemia LDL goal <100 12/21/2010   Essential hypertension, goal Less than 130/85 12/21/2010   GERD (gastroesophageal reflux disease) 07/18/2010   Insulin resistance 07/18/2010   Fatty liver 07/18/2010   REFERRING PROVIDER: Everrett Coombe.  REFERRING DIAG: Acute lumbar radiculopathy  Rationale for Evaluation and Treatment: Rehabilitation  THERAPY DIAG:  Other low back pain  Muscle spasm of back  ONSET DATE: ~2 weeks ago.  SUBJECTIVE:                                                                                                                                                                                           SUBJECTIVE STATEMENT: Doing good today.  Pain at a 2.  PERTINENT HISTORY:  HTN, bilateral TKA's, left TSA.  PAIN:  Are you having pain? Yes: NPRS scale: 2/10 Pain location: Left glut. Pain description: Ache. Aggravating factors: Not sure. Relieving factors: Not  sure.  PRECAUTIONS: None  PATIENT GOALS: Get out of pain.  OBJECTIVE:   PATIENT SURVEYS:  FOTO    POSTURE: rounded shoulders and forward head  PALPATION: Tender left lower lumbar musculature which is very taut to palpation and also tender over her left SIJ and upper gluteal muscualture.  LUMBAR ROM:  Active lumbar flexion decreased by 25% and extension is 20 degrees.  LOWER EXTREMITY MMT:   Normal LE strength.  LUMBAR SPECIAL TESTS:  Equal leg lengths. (-) SLR testing.  GAIT: Slow and purposeful    TODAY'S TREATMENT:                                                                                                                              DATE: 09/25/22  EXERCISE LOG  Exercise Repetitions and Resistance Comments  Nustep L3 x13 min                   Patient in right SDLY position with folded pillow between knees for comfort;  Perform STW/M x 12 minutes to patient's left affected hip, gluteal and ischial tuberosity region f/b HMP and IFC at 80-150 Hz on 40% scan x 20 minutes.  Normal modality response following removal of modality.   ASSESSMENT: CLINICAL IMPRESSION: Good response to last treatment and today.  Patient feeling about 40-50% better overall.  OBJECTIVE IMPAIRMENTS: Abnormal gait, decreased activity tolerance, decreased ROM, increased muscle spasms, postural dysfunction, and pain.   ACTIVITY LIMITATIONS: carrying, lifting, bending, and locomotion level  PARTICIPATION LIMITATIONS: meal prep, cleaning,  and laundry  REHAB POTENTIAL: Good  CLINICAL DECISION MAKING: Stable/uncomplicated  EVALUATION COMPLEXITY: Low  GOALS: SHORT TERM GOALS: Target date: 07/28/22.  Ind with a HEP. Goal status: On-going  LONG TERM GOALS: Target date: 10/12/22  Perform ADL's with pain not > 3/10.  Goal status: On-going  2.  Walk a community distance with pain not > 2-3/10.  Goal status: On-going  PLAN:  PT FREQUENCY: 2x/week  PT DURATION: 6 weeks  PLANNED  INTERVENTIONS: Therapeutic exercises, Therapeutic activity, Neuromuscular re-education, Balance training, Gait training, Patient/Family education, Self Care, Joint mobilization, Dry Needling, Electrical stimulation, Cryotherapy, Moist heat, Ultrasound, and Manual therapy.  PLAN FOR NEXT SESSION: Modalities and STW/M as needed.  Core exercise progression.  Progress Note Reporting Period 07/14/22 to 09/25/22  See note below for Objective Data and Assessment of Progress/Goals. Good progress with patient reporting a 40-50% subjective improvement rating.      [place] Erle Guster MPT

## 2022-10-02 ENCOUNTER — Encounter: Payer: Self-pay | Admitting: Physical Therapy

## 2022-10-02 ENCOUNTER — Ambulatory Visit: Payer: Medicare Other | Admitting: Physical Therapy

## 2022-10-02 DIAGNOSIS — M62830 Muscle spasm of back: Secondary | ICD-10-CM | POA: Diagnosis not present

## 2022-10-02 DIAGNOSIS — M5459 Other low back pain: Secondary | ICD-10-CM

## 2022-10-02 NOTE — Therapy (Signed)
OUTPATIENT PHYSICAL THERAPY THORACOLUMBAR TREATMENT   Patient Name: Martha Perkins MRN: [id number] DOB:29-May-1945, 77 y.o., female Today's Date: 10/02/2022  END OF SESSION:  PT End of Session - 10/02/22 1100     Visit Number 11    Number of Visits 12    Date for PT Re-Evaluation 10/12/22    Authorization Type FOTO.    PT Start Time 1101    PT Stop Time 1150    PT Time Calculation (min) 49 min    Activity Tolerance Patient tolerated treatment well    Behavior During Therapy WFL for tasks assessed/performed            Past Medical History:  Diagnosis Date   Arthritis    neck, back, hips, knees, ankles   Asthma    use everyday   Atypical mole 12/03/2017   Left Lower Shin (mild)   Atypical mole 05/21/2018   Left Deltoid (mild)   Atypical mole 05/21/2018   Left Tricep (moderate to severe)   Cancer (HCC) 2019   skin   Dyspnea    Dysrhythmia    PVCs , Afib   Heart murmur    History of kidney stones    Hyperlipidemia    Hypertension    Hyperthyroidism 08/08/2017   radio active iodine x2   Hypothyroidism    now take synthroid   Lymphedema 12/03/2021   Paroxysmal atrial fibrillation (HCC) 08/09/2015   PONV (postoperative nausea and vomiting)    Superficial basal cell carcinoma (BCC) 12/03/2017   Right Outer Abdomen Mid (curet and 5FU)   Past Surgical History:  Procedure Laterality Date   CARDIOVERSION N/A 08/05/2016   Procedure: CARDIOVERSION;  Surgeon: Chilton Si, MD;  Location: [facility] ENDOSCOPY;  Service: Cardiovascular;  Laterality: N/A;   CATARACT EXTRACTION Left 09/2012   Dr. Darcus Pester   LITHOTRIPSY     REPLACEMENT TOTAL KNEE BILATERAL  2011   REVERSE SHOULDER ARTHROPLASTY Left 07/19/2020   Procedure: REVERSE SHOULDER ARTHROPLASTY;  Surgeon: Francena Hanly, MD;  Location: [facility]S;  Service: Orthopedics;  Laterality: Left;    REVERSE SHOULDER ARTHROPLASTY Right 12/06/2020   Procedure: REVERSE SHOULDER ARTHROPLASTY;  Surgeon: Francena Hanly, MD;   Location: [facility] OR[facility];  Service: Orthopedics;  Laterality: Right;    THYROIDECTOMY  1971   TUBAL LIGATION  1979   Patient Active Problem List   Diagnosis Date Noted   Bilateral lower extremity edema 09/10/2022   Acute lumbar radiculopathy 07/01/2022   Abnormal [place] (ultrasound) of abdomen 01/06/2022   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   Abnormal liver enzymes 01/06/2022   Lymphedema 12/03/2021   Venous insufficiency of both lower extremities 10/03/2020   S/P reverse total shoulder arthroplasty, 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   Aortic atherosclerosis (HCC), LDL goal < 70 12/26/2019   Palpitations 10/21/2019   Morbid obesity (HCC) 10/21/2019   Encounter for chronic pain management 06/30/2019   Hypothyroidism 09/11/2016   History of atrial fibrillation    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/14/2016   Restrictive lung disease 09/28/2015   Lumbar degenerative disc disease 08/15/2015   PAF (paroxysmal atrial fibrillation) (HCC) 08/09/2015   Chronic allergic rhinitis 05/17/2014   Cataract 06/03/2012   Hyperlipidemia LDL goal <100 12/21/2010   Essential hypertension, goal Less than 130/85 12/21/2010   GERD (gastroesophageal reflux disease) 07/18/2010   Insulin resistance 07/18/2010   Fatty liver 07/18/2010   REFERRING PROVIDER: Everrett Coombe.  REFERRING DIAG: Acute lumbar radiculopathy  Rationale for Evaluation and Treatment: Rehabilitation  THERAPY DIAG:  Other low back pain  Muscle spasm of back  ONSET DATE: ~2 weeks ago.  SUBJECTIVE:                                                                                                                                                                                           SUBJECTIVE STATEMENT: Has some good days and some bad days. Has to rest in recumbent position a lot for her lymphedema and states that after she rests for a while her LBP begins.  PERTINENT HISTORY:  HTN, bilateral TKA's, left TSA.  PAIN:  Are you having pain? Yes:  NPRS scale: 2-3/10 Pain location: Left glut. Pain description: Ache. Aggravating factors: Not sure. Relieving factors: Not sure.  PRECAUTIONS: None  PATIENT GOALS: Get out of pain.  OBJECTIVE:   PATIENT SURVEYS:  FOTO    POSTURE: rounded shoulders and forward head  PALPATION: Tender left lower lumbar musculature which is very taut to palpation and also tender over her left SIJ and upper gluteal muscualture.  LUMBAR ROM:  Active lumbar flexion decreased by 25% and extension is 20 degrees.  LOWER EXTREMITY MMT:   Normal LE strength.  LUMBAR SPECIAL TESTS:  Equal leg lengths. (-) SLR testing.  GAIT: Slow and purposeful  TODAY'S TREATMENT:                                                                                                                              DATE: 10/02/22  EXERCISE LOG  Exercise Repetitions and Resistance Comments  Nustep L3 x13 min                    Manual Therapy Soft Tissue Mobilization: L glute, lumbar paraspinals, reduce tone and pain    Modalities  Date: 10/02/22 Unattended Estim: Hip, IFC, 10 mins, Pain and Tone Hot Pack: Hip, 10 mins, Pain and Tone  ASSESSMENT: CLINICAL IMPRESSION: Patient presented in clinic with reports of increased pain especially after laying down due to lymphedema. Patient presented with increased tone and TP throughout L glute and lumbar paraspinals. Patient has been unable to feel any difference following  DN. Normal modalities response noted following removal of the modalities.  OBJECTIVE IMPAIRMENTS: Abnormal gait, decreased activity tolerance, decreased ROM, increased muscle spasms, postural dysfunction, and pain.   ACTIVITY LIMITATIONS: carrying, lifting, bending, and locomotion level  PARTICIPATION LIMITATIONS: meal prep, cleaning, and laundry  REHAB POTENTIAL: Good  CLINICAL DECISION MAKING: Stable/uncomplicated  EVALUATION COMPLEXITY: Low  GOALS: SHORT TERM GOALS: Target date: 07/28/22.  Ind  with a HEP. Goal status: On-going  LONG TERM GOALS: Target date: 10/12/22  Perform ADL's with pain not > 3/10.  Goal status: On-going  2.  Walk a community distance with pain not > 2-3/10.  Goal status: On-going  PLAN:  PT FREQUENCY: 2x/week  PT DURATION: 6 weeks  PLANNED INTERVENTIONS: Therapeutic exercises, Therapeutic activity, Neuromuscular re-education, Balance training, Gait training, Patient/Family education, Self Care, Joint mobilization, Dry Needling, Electrical stimulation, Cryotherapy, Moist heat, Ultrasound, and Manual therapy.  PLAN FOR NEXT SESSION: Modalities and STW/M as needed.  Core exercise progression.  Marvell Fuller, PTA 10/02/22 12:04 PM

## 2022-10-06 ENCOUNTER — Other Ambulatory Visit: Payer: Self-pay | Admitting: Family Medicine

## 2022-10-08 ENCOUNTER — Other Ambulatory Visit: Payer: Self-pay | Admitting: Family Medicine

## 2022-10-08 DIAGNOSIS — M5136 Other intervertebral disc degeneration, lumbar region: Secondary | ICD-10-CM

## 2022-10-08 DIAGNOSIS — G8929 Other chronic pain: Secondary | ICD-10-CM

## 2022-10-29 ENCOUNTER — Other Ambulatory Visit: Payer: Self-pay | Admitting: Family Medicine

## 2022-10-29 DIAGNOSIS — I10 Essential (primary) hypertension: Secondary | ICD-10-CM

## 2022-11-05 ENCOUNTER — Other Ambulatory Visit: Payer: Self-pay | Admitting: Family Medicine

## 2022-11-05 DIAGNOSIS — G8929 Other chronic pain: Secondary | ICD-10-CM

## 2022-11-05 DIAGNOSIS — M5136 Other intervertebral disc degeneration, lumbar region: Secondary | ICD-10-CM

## 2022-12-02 ENCOUNTER — Other Ambulatory Visit: Payer: Self-pay | Admitting: Family Medicine

## 2022-12-02 DIAGNOSIS — Z1231 Encounter for screening mammogram for malignant neoplasm of breast: Secondary | ICD-10-CM

## 2022-12-08 ENCOUNTER — Other Ambulatory Visit: Payer: Self-pay | Admitting: Family Medicine

## 2022-12-08 DIAGNOSIS — G8929 Other chronic pain: Secondary | ICD-10-CM

## 2022-12-08 DIAGNOSIS — M51369 Other intervertebral disc degeneration, lumbar region without mention of lumbar back pain or lower extremity pain: Secondary | ICD-10-CM

## 2022-12-15 ENCOUNTER — Telehealth: Payer: Self-pay

## 2022-12-15 DIAGNOSIS — E890 Postprocedural hypothyroidism: Secondary | ICD-10-CM

## 2022-12-15 NOTE — Telephone Encounter (Signed)
Agree with above 

## 2022-12-15 NOTE — Telephone Encounter (Signed)
[facility] called and states the manufacturer for the levothyroxine has changed. I did call and advise Martha Perkins to recheck TSH 6-8 weeks after starting the new manufacturer.

## 2022-12-16 ENCOUNTER — Encounter: Payer: Self-pay | Admitting: Family Medicine

## 2022-12-16 ENCOUNTER — Ambulatory Visit (INDEPENDENT_AMBULATORY_CARE_PROVIDER_SITE_OTHER): Payer: Medicare Other | Admitting: Family Medicine

## 2022-12-16 VITALS — BP 125/59 | HR 73 | Ht 60.0 in | Wt 210.0 lb

## 2022-12-16 DIAGNOSIS — G8929 Other chronic pain: Secondary | ICD-10-CM | POA: Diagnosis not present

## 2022-12-16 DIAGNOSIS — E88819 Insulin resistance, unspecified: Secondary | ICD-10-CM | POA: Diagnosis not present

## 2022-12-16 DIAGNOSIS — E890 Postprocedural hypothyroidism: Secondary | ICD-10-CM | POA: Diagnosis not present

## 2022-12-16 DIAGNOSIS — I872 Venous insufficiency (chronic) (peripheral): Secondary | ICD-10-CM

## 2022-12-16 DIAGNOSIS — I10 Essential (primary) hypertension: Secondary | ICD-10-CM

## 2022-12-16 DIAGNOSIS — Z23 Encounter for immunization: Secondary | ICD-10-CM

## 2022-12-16 DIAGNOSIS — E785 Hyperlipidemia, unspecified: Secondary | ICD-10-CM | POA: Diagnosis not present

## 2022-12-16 DIAGNOSIS — K760 Fatty (change of) liver, not elsewhere classified: Secondary | ICD-10-CM

## 2022-12-16 LAB — POCT GLYCOSYLATED HEMOGLOBIN (HGB A1C): Hemoglobin A1C: 5.6 % (ref 4.0–5.6)

## 2022-12-16 LAB — POCT UA - MICROALBUMIN
Creatinine, POC: 50 mg/dL
Microalbumin Ur, POC: 30 mg/L

## 2022-12-16 MED ORDER — VALSARTAN 160 MG PO TABS: 160.0000 mg | ORAL_TABLET | Freq: Every day | ORAL | 1 refills | Status: DC

## 2022-12-16 NOTE — Assessment & Plan Note (Signed)
Really well on half a tab of the 320 mg valsartan.  She says sometimes when she splits the pill the jagged edge gets hung in her throat super can go ahead and send in a new prescription for the 160 dose.  Continue to monitor blood pressure as she loses weight it may continue to go down in which case we can always adjust her dose down further.

## 2022-12-16 NOTE — Progress Notes (Signed)
Established Patient Office Visit  Subjective   Patient ID: Martha Perkins, female    DOB: 01-07-1946  Age: 77 y.o. MRN: [id number]  No chief complaint on file.   HPI  Impaired fasting glucose-no increased thirst or urination. No symptoms consistent with hypoglycemia.  Hypertension- Pt denies chest pain, SOB, dizziness, or heart palpitations.  Taking meds as directed w/o problems.  Denies medication side effects.    Mail order is having true brand of thyroid medication she is not received in the mail yet.  But she does know to go and get her labs rechecked in 6 weeks.  She hasn't taken her statin since May.    Lab Results  Component Value Date   CHOL 289 (H) 12/23/2021   HDL 49 (L) 12/23/2021   LDLCALC 202 (H) 12/23/2021   TRIG 202 (H) 12/23/2021   CHOLHDL 5.9 (H) 12/23/2021    The 10-year ASCVD risk score (Arnett DK, et al., 2019) is: 36.3%   Values used to calculate the score:     Age: 84 years     Sex: Female     Is Non-Hispanic African American: No     Diabetic: Yes     Tobacco smoker: No     Systolic Blood Pressure: 125 mmHg     Is BP treated: Yes     HDL Cholesterol: 49 mg/dL     Total Cholesterol: 292 mg/dL     ROS    Objective:     BP (!) 125/59   Pulse 73   Ht 5' (1.524 m)   Wt 210 lb (95.3 kg)   SpO2 96%   BMI 41.01 kg/m    Physical Exam Vitals and nursing note reviewed.  Constitutional:      Appearance: Normal appearance.  HENT:     Head: Normocephalic and atraumatic.  Eyes:     Conjunctiva/sclera: Conjunctivae normal.  Cardiovascular:     Rate and Rhythm: Normal rate and regular rhythm.  Pulmonary:     Effort: Pulmonary effort is normal.     Breath sounds: Normal breath sounds.  Skin:    General: Skin is warm and dry.  Neurological:     Mental Status: She is alert.  Psychiatric:        Mood and Affect: Mood normal.      Results for orders placed or performed in visit on 12/16/22  POCT HgB A1C  Result Value Ref Range    Hemoglobin A1C 5.6 4.0 - 5.6 %   HbA1c POC (<> result, manual entry)     HbA1c, POC (prediabetic range)     HbA1c, POC (controlled diabetic range)    POCT UA - Microalbumin  Result Value Ref Range   Microalbumin Ur, POC 30 mg/L   Creatinine, POC 50 mg/dL   Albumin/Creatinine Ratio, Urine, POC 30-300       The 10-year ASCVD risk score (Arnett DK, et al., 2019) is: 36.3%    Assessment & Plan:   Problem List Items Addressed This Visit       Cardiovascular and Mediastinum   Venous insufficiency of both lower extremities    Still getting frequent episodes of swelling in her lower extremities when she is more swollen it really limits her ability to walk and exercise.      Relevant Medications   valsartan (DIOVAN) 160 MG tablet   Essential hypertension, goal Less than 130/85    Really well on half a tab of the 320 mg valsartan.  She  says sometimes when she splits the pill the jagged edge gets hung in her throat super can go ahead and send in a new prescription for the 160 dose.  Continue to monitor blood pressure as she loses weight it may continue to go down in which case we can always adjust her dose down further.      Relevant Medications   valsartan (DIOVAN) 160 MG tablet   Other Relevant Orders   TSH   CMP14+EGFR   Lipid panel     Digestive   Fatty liver    She has lost 8 pounds since she was last here and has done a great job on really reducing her carb intake.        Endocrine   Insulin resistance - Primary   Relevant Orders   POCT HgB A1C (Completed)   POCT UA - Microalbumin (Completed)   TSH   CMP14+EGFR   Lipid panel   Hypothyroidism    Plan to recheck TSH in 6 weeks after she receives the new brand in the mail.        Other   Hyperlipidemia LDL goal <100    She is very concerned about the effects of a statin on her liver and kidney function and so she decided to stop it in May and does not want to restart it right now she wants to continue to work on  her diet and exercise.      Relevant Medications   valsartan (DIOVAN) 160 MG tablet   Other Relevant Orders   TSH   CMP14+EGFR   Lipid panel   Encounter for chronic pain management    Indication for chronic opioid: osteoarthritis and lumbar degenerative disc disease.  Medication and dose: Tramadol 50mg , TID PRN # pills per month: 90 Last UDS date:  Opioid Treatment Agreement signed (Y/N): 12/16/22 Opioid Treatment Agreement last reviewed with patient:  12/2022 [facility] reviewed this encounter (include red flags):  Yes        Other Visit Diagnoses     Encounter for immunization       Relevant Orders   Flu Vaccine Trivalent High Dose (Fluad) (Completed)       No follow-ups on file.    Nani Gasser, MD

## 2022-12-16 NOTE — Assessment & Plan Note (Signed)
She is very concerned about the effects of a statin on her liver and kidney function and so she decided to stop it in May and does not want to restart it right now she wants to continue to work on her diet and exercise.

## 2022-12-16 NOTE — Assessment & Plan Note (Signed)
Still getting frequent episodes of swelling in her lower extremities when she is more swollen it really limits her ability to walk and exercise.

## 2022-12-16 NOTE — Assessment & Plan Note (Addendum)
Indication for chronic opioid: osteoarthritis and lumbar degenerative disc disease.  Medication and dose: Tramadol 50mg , TID PRN # pills per month: 90 Last UDS date:  Opioid Treatment Agreement signed (Y/N): 12/16/22 Opioid Treatment Agreement last reviewed with patient:  12/2022 [facility] reviewed this encounter (include red flags):  Yes

## 2022-12-16 NOTE — Assessment & Plan Note (Signed)
She has lost 8 pounds since she was last here and has done a great job on really reducing her carb intake.

## 2022-12-16 NOTE — Assessment & Plan Note (Signed)
Plan to recheck TSH in 6 weeks after she receives the new brand in the mail.

## 2022-12-19 ENCOUNTER — Telehealth: Payer: Self-pay | Admitting: *Deleted

## 2022-12-19 NOTE — Telephone Encounter (Signed)
Got a fax about pt's levothyroxine 125 mcg changing from mylan to alovogen and that she approved this switch.  I called her and LVM advising her that she will need to come in 6 weeks after she has been taking this to have her tsh checked and if she has any questions to either call or send a mychart to discuss.

## 2023-01-08 ENCOUNTER — Ambulatory Visit
Admission: RE | Admit: 2023-01-08 | Discharge: 2023-01-08 | Disposition: A | Discharge status: Home / Self Care | Payer: Medicare Other | Source: Ambulatory Visit | Attending: Family Medicine | Admitting: Family Medicine

## 2023-01-08 DIAGNOSIS — Z1231 Encounter for screening mammogram for malignant neoplasm of breast: Secondary | ICD-10-CM | POA: Diagnosis not present

## 2023-01-09 ENCOUNTER — Ambulatory Visit (INDEPENDENT_AMBULATORY_CARE_PROVIDER_SITE_OTHER): Payer: Medicare Other | Admitting: Family Medicine

## 2023-01-09 ENCOUNTER — Encounter: Payer: Self-pay | Admitting: Family Medicine

## 2023-01-09 DIAGNOSIS — M7989 Other specified soft tissue disorders: Secondary | ICD-10-CM

## 2023-01-09 MED ORDER — FUROSEMIDE 40 MG PO TABS
80.0000 mg | ORAL_TABLET | Freq: Every day | ORAL | 0 refills | Status: DC | PRN
Start: 2023-01-09 — End: 2023-10-22

## 2023-01-09 NOTE — Progress Notes (Signed)
   Established Patient Office Visit  Subjective   Patient ID: Martha Perkins, female    DOB: 19-Jul-1945  Age: 77 y.o. MRN: [id number]  Chief Complaint  Patient presents with   Leg Swelling    Bilateral leg swelling     HPI  She has a history of recurrent venous stasis dermatitis lower extremity swelling and lymphedema.  For about the last 4 to 5 days she has had more redness stinging and burning sensation in her lower legs.  At times it feels like they are weeping though she has not noticed any drainage on her clothing.  No fevers chills or sweats.  No major dietary changes.  She does feel like her weight is up a little bit as well.  She had really been working on her diet and cutting back on carbs when I last saw her.    ROS    Objective:     BP 124/66   Pulse 84   Ht 5' (1.524 m)   Wt 211 lb (95.7 kg)   SpO2 95%   BMI 41.21 kg/m    Physical Exam Vitals reviewed.  Constitutional:      Appearance: Normal appearance.  HENT:     Head: Normocephalic.  Pulmonary:     Effort: Pulmonary effort is normal.  Skin:    Comments: Trace swelling in both lower extremities with the thickening of the skin over both shins. Mild erythema bilaterally. No open wounds or ulcerations.    Neurological:     Mental Status: She is alert and oriented to person, place, and time.  Psychiatric:        Mood and Affect: Mood normal.        Behavior: Behavior normal.     No results found for any visits on 01/09/23.    The 10-year ASCVD risk score (Arnett DK, et al., 2019) is: 35.9%    Assessment & Plan:   Problem List Items Addressed This Visit   None Visit Diagnoses     Localized swelling of both lower extremities       Relevant Medications   furosemide (LASIX) 40 MG tablet      Erythema and swelling of both lower extremities-most consistent with venous stasis with a combination of lymphedema.  She says that it feels better when we wrapped them with Unna boots it really helps with  the burning sensation so even though she does not have any open wounds or drainage today working to go ahead and wrap her.  She can restart her furosemide 1 tab daily for the next couple of days and weigh herself daily on her home scale would really like for her to come down about 3 pounds total if possible.  She will need to take her potassium daily with the furosemide.   Unna boot applied to each lower leg.   No follow-ups on file.    Nani Gasser, MD

## 2023-01-09 NOTE — Patient Instructions (Signed)
Take 20 mEq of potassium with your Lasix 40mg  dialy. Weight yourself daily. I want Your weight down about 3 lbs.

## 2023-01-12 ENCOUNTER — Encounter: Payer: Self-pay | Admitting: Family Medicine

## 2023-01-12 NOTE — Progress Notes (Signed)
HI Martha Perkins, your mammo shows that you need further evaluation. The imaging dept will be calling you to schedule

## 2023-01-13 ENCOUNTER — Other Ambulatory Visit: Payer: Self-pay | Admitting: Family Medicine

## 2023-01-13 ENCOUNTER — Encounter: Payer: Self-pay | Admitting: Family Medicine

## 2023-01-13 DIAGNOSIS — R928 Other abnormal and inconclusive findings on diagnostic imaging of breast: Secondary | ICD-10-CM

## 2023-01-14 NOTE — Telephone Encounter (Signed)
Bc of abnormal mammo those will did compression views and [place]

## 2023-01-14 NOTE — Telephone Encounter (Signed)
She has had 3D mammo done since 2016.  That is when we got the new 3D scanner.    Not sure if she has a question about that?

## 2023-01-15 ENCOUNTER — Ambulatory Visit (INDEPENDENT_AMBULATORY_CARE_PROVIDER_SITE_OTHER): Payer: Medicare Other | Admitting: Family Medicine

## 2023-01-15 ENCOUNTER — Encounter: Payer: Self-pay | Admitting: Family Medicine

## 2023-01-15 VITALS — BP 128/79 | HR 78 | Ht 60.0 in | Wt 210.0 lb

## 2023-01-15 DIAGNOSIS — L03119 Cellulitis of unspecified part of limb: Secondary | ICD-10-CM

## 2023-01-15 MED ORDER — DOXYCYCLINE HYCLATE 100 MG PO TABS: 100.0000 mg | ORAL_TABLET | Freq: Two times a day (BID) | ORAL | 0 refills | Status: DC

## 2023-01-15 NOTE — Telephone Encounter (Signed)
Patient just misunderstood the difference from a 3D mammogram and a diagnostic mammogram. She is ok with moving forward.

## 2023-01-15 NOTE — Patient Instructions (Signed)
Elevated  feet above heart level for about 10 minutes twice a day. Okay to retry the diuretic for a day or 2 if you would like. Rx for doxycycline sent to the pharmacy.

## 2023-01-15 NOTE — Progress Notes (Signed)
   Established Patient Office Visit  Subjective   Patient ID: Martha Perkins, female    DOB: [birth year]  Age: 77 y.o. MRN: [id number]  Chief Complaint  Patient presents with   Leg Swelling    HPI  She is returning today she says after she took the compression stockings off they started feeling bad again with the burning and itching she is been using some aloe gel to try to get relief.  She says the Unna boots were not the same as the ones she has had before they were still loose she and wet feeling they did not dry out like they normally do.  She says last night she was so uncomfortable with her legs that it was making it hard to the rest and sleep.  Says when she went home she weighed 210 on her home scale she took the Lasix daily for 3 days and was down to 208 but then she went to the bathroom and noticed a little bright red blood she was worried it might be coming from the Lasix so she stopped it.  She says she sometimes does get irritated in the groin area.  But has not had any dysuria or discomfort pain tenderness or swelling.    ROS    Objective:     BP 128/79   Pulse 78   Ht 5' (1.524 m)   Wt 210 lb (95.3 kg)   SpO2 98%   BMI 41.01 kg/m    Physical Exam Vitals reviewed.  Constitutional:      Appearance: Normal appearance.  HENT:     Head: Normocephalic.  Pulmonary:     Effort: Pulmonary effort is normal.  Musculoskeletal:     Comments: Extremities are erythematous over anterior shins with some thick scaling skin along mentions.  No active drainage.  Neurological:     Mental Status: She is alert and oriented to person, place, and time.  Psychiatric:        Mood and Affect: Mood normal.        Behavior: Behavior normal.      No results found for any visits on 01/15/23.    The 10-year ASCVD risk score (Arnett DK, et al., 2019) is: 37.7%    Assessment & Plan:   Problem List Items Addressed This Visit   None Visit Diagnoses     Cellulitis of lower  extremity, unspecified laterality    -  Primary      Possible early cellulitis and then go ahead and treat with doxycycline.  Continue to work on elevation, can consider restarting the diuretic for couple days if tolerated.  I suspect that the blood might have been from irritation or a tear it does not sound like she has got urinary symptoms and it has not happened again since then.  Also encouraged her to use compression stockings.  She does have some Cereve at home which I think is great apply twice a day.  No follow-ups on file.    Nani Gasser, MD

## 2023-01-15 NOTE — Telephone Encounter (Signed)
Patient misunderstood the difference between 3D mammogram and a diagnostic mammogram. She is ok with moving forward.

## 2023-01-20 ENCOUNTER — Ambulatory Visit (INDEPENDENT_AMBULATORY_CARE_PROVIDER_SITE_OTHER): Payer: Medicare Other | Admitting: Family Medicine

## 2023-01-20 VITALS — BP 126/65 | HR 79 | Ht 62.0 in | Wt 208.7 lb

## 2023-01-20 DIAGNOSIS — Z0000 Encounter for general adult medical examination without abnormal findings: Secondary | ICD-10-CM

## 2023-01-20 DIAGNOSIS — Z780 Asymptomatic menopausal state: Secondary | ICD-10-CM

## 2023-01-20 NOTE — Progress Notes (Signed)
MEDICARE ANNUAL WELLNESS VISIT  01/20/2023  Telephone Visit Disclaimer This Medicare AWV was conducted by telephone due to national recommendations for restrictions regarding the COVID-19 Pandemic (e.g. social distancing).  I verified, using two identifiers, that I am speaking with Martha Perkins or their authorized healthcare agent. I discussed the limitations, risks, security, and privacy concerns of performing an evaluation and management service by telephone and the potential availability of an in-person appointment in the future. The patient expressed understanding and agreed to proceed.  Location of Patient: Home Location of Provider (nurse):  In the office.  Subjective:    Martha Perkins is a 77 y.o. female patient of Metheney, Martha Ehlers, MD who had a Medicare Annual Wellness Visit today via telephone. Martha Perkins is Retired and lives with their spouse. she has 2 children. she reports that she is socially active and does interact with friends/family regularly. she is moderately physically active and enjoys singing.  Patient Care Team: Agapito Games, MD as PCP - General (Family Medicine) Romero Belling, MD (Inactive) as Consulting Physician (Endocrinology) Gabriel Carina, [facility] (Pharmacist) Gabriel Carina, [facility] as Pharmacist (Pharmacist) Suzi Roots as Physician Assistant (Dermatology)     01/20/2023    3:08 PM 01/17/2022    3:01 PM 12/27/2020    3:51 PM 12/06/2020   12:49 PM 11/21/2020    1:11 PM 08/15/2020    2:00 PM 07/11/2020   11:28 AM  Advanced Directives  Does Patient Have a Medical Advance Directive? No No No No No No No  Would patient like information on creating a medical advance directive? No - Patient declined No - Patient declined  No - Patient declined No - Patient declined  Yes (MAU/Ambulatory/Procedural Areas - Information given)    Hospital Utilization Over the Past 12 Months: # of hospitalizations or ER visits: 0 # of surgeries: 0  Review  of Systems    Patient reports that her overall health is unchanged compared to last year.  History obtained from chart review and the patient  Patient Reported Readings (BP, Pulse, CBG, Weight, etc) BP: 126/65 Pulse: 79 Weight: 208 lb Height: 28f2  Pain Assessment Pain : 0-10 Pain Score: 3  Pain Type: Chronic pain Pain Location: Leg Pain Orientation: Right, Left Pain Descriptors / Indicators: Discomfort Pain Onset: More than a month ago Pain Frequency: Intermittent Pain Relieving Factors: lotion and elevation of legs and rest  Pain Relieving Factors: lotion and elevation of legs and rest  Current Medications & Allergies (verified) Allergies as of 01/20/2023       Reactions   Meperidine Nausea Only, Nausea And Vomiting   Meperidine And Related Nausea And Vomiting   Oxycodone    loopy   Percocet [oxycodone-acetaminophen]    Feels loopy   Demerol Nausea And Vomiting        Medication List        Accurate as of January 20, 2023  3:28 PM. If you have any questions, ask your nurse or doctor.          acetaminophen 500 MG tablet Commonly known as: TYLENOL Take 325 mg by mouth every 6 (six) hours as needed for moderate pain or headache.   albuterol 108 (90 Base) MCG/ACT inhaler Commonly known as: VENTOLIN HFA INHALE TWO PUFFS INTO THE LUNGS EVERY 6 HOURS AS NEEDED FOR WHEEZING OR SHORTNESS OF BREATH   Arnuity Ellipta 100 MCG/ACT Aepb Generic drug: Fluticasone Furoate Inhale 1 puff into the lungs daily.  ascorbic acid 500 MG tablet Commonly known as: VITAMIN C Take 500 mg by mouth 2 (two) times daily.   cyanocobalamin 1000 MCG tablet Commonly known as: VITAMIN B12 Take 1,000 mcg by mouth daily.   doxycycline 100 MG tablet Commonly known as: VIBRA-TABS Take 1 tablet (100 mg total) by mouth 2 (two) times daily.   fluticasone 50 MCG/ACT nasal spray Commonly known as: FLONASE USE 1 SPRAY IN BOTH  NOSTRILS TWICE DAILY   furosemide 40 MG tablet Commonly  known as: LASIX Take 2 tablets (80 mg total) by mouth daily as needed.   ketoconazole 2 % cream Commonly known as: NIZORAL APPLY TOPICALLY DAILY AS  NEEDED FOR IRRITATION   levothyroxine 125 MCG tablet Commonly known as: SYNTHROID TAKE 1 TABLET BY MOUTH DAILY  BEFORE BREAKFAST   loratadine 10 MG tablet Commonly known as: CLARITIN Take 10 mg by mouth at bedtime.   Magnesium Citrate 200 MG Tabs Take 200 mg by mouth daily.   metoprolol succinate 50 MG 24 hr tablet Commonly known as: TOPROL-XL Take 1 tablet (50 mg total) by mouth daily. Take with or immediately following a meal.   nystatin powder Commonly known as: MYCOSTATIN/NYSTOP Apply 1 Application topically 3 (three) times daily as needed (redness/itch on affected skin).   spironolactone 25 MG tablet Commonly known as: ALDACTONE TAKE ONE TABLET BY MOUTH EVERY DAY   traMADol 50 MG tablet Commonly known as: ULTRAM Take 1 tablet (50 mg total) by mouth 3 (three) times daily as needed.   triamcinolone ointment 0.5 % Commonly known as: KENALOG Apply 1 Application topically 2 (two) times daily as needed (itch or burn).   TURMERIC PO Take 1,500 mg by mouth daily.   valsartan 160 MG tablet Commonly known as: DIOVAN Take 1 tablet (160 mg total) by mouth daily.   Vitamin D 125 MCG (5000 UT) Caps Take 5,000 Units by mouth daily. With K2        History (reviewed): Past Medical History:  Diagnosis Date   Arthritis    neck, back, hips, knees, ankles   Asthma    use everyday   Atypical mole 12/03/2017   Left Lower Shin (mild)   Atypical mole 05/21/2018   Left Deltoid (mild)   Atypical mole 05/21/2018   Left Tricep (moderate to severe)   Cancer (HCC) 2019   skin   Dyspnea    Dysrhythmia    PVCs , Afib   Heart murmur    History of kidney stones    Hyperlipidemia    Hypertension    Hyperthyroidism 08/08/2017   radio active iodine x2   Hypothyroidism    now take synthroid   Lymphedema 12/03/2021   Paroxysmal  atrial fibrillation (HCC) 08/09/2015   PONV (postoperative nausea and vomiting)    Superficial basal cell carcinoma (BCC) 12/03/2017   Right Outer Abdomen Mid (curet and 5FU)   Past Surgical History:  Procedure Laterality Date   CARDIOVERSION N/A 08/05/2016   Procedure: CARDIOVERSION;  Surgeon: Chilton Si, MD;  Location: [facility] ENDOSCOPY;  Service: Cardiovascular;  Laterality: N/A;   CATARACT EXTRACTION Left 09/2012   Dr. Darcus Pester   LITHOTRIPSY     REPLACEMENT TOTAL KNEE BILATERAL  2011   REVERSE SHOULDER ARTHROPLASTY Left 07/19/2020   Procedure: REVERSE SHOULDER ARTHROPLASTY;  Surgeon: Francena Hanly, MD;  Location: [facility] ORS;  Service: Orthopedics;  Laterality: Left;    REVERSE SHOULDER ARTHROPLASTY Right 12/06/2020   Procedure: REVERSE SHOULDER ARTHROPLASTY;  Surgeon: Francena Hanly, MD;  Location: [facility];  Service: Orthopedics;  Laterality: Right;    THYROIDECTOMY  1971   TUBAL LIGATION  1979   Family History  Problem Relation Age of Onset   Coronary artery disease Mother 46       deceased   Depression Mother    Hyperlipidemia Mother    Diabetes Paternal Grandmother    Thyroid disease Sister    COPD Father    Breast cancer Neg Hx    Social History   Socioeconomic History   Marital status: Married    Spouse name: Viviann Spare   Number of children: 2   Years of education: 16   Highest education level: Bachelor's degree (e.g., BA, AB, BS)  Occupational History   Occupation: Retired Charity fundraiser.   Tobacco Use   Smoking status: Never   Smokeless tobacco: Never  Vaping Use   Vaping status: Never Used  Substance and Sexual Activity   Alcohol use: No   Drug use: No   Sexual activity: Not Currently  Other Topics Concern   Not on file  Social History Narrative   Lives with her husband. She enjoys singing.   Social Determinants of Health   Financial Resource Strain: Low Risk  (01/20/2023)   Overall Financial Resource Strain (CARDIA)    Difficulty of Paying Living  Expenses: Not hard at all  Food Insecurity: No Food Insecurity (01/20/2023)   Hunger Vital Sign    Worried About Running Out of Food in the Last Year: Never true    Ran Out of Food in the Last Year: Never true  Transportation Needs: No Transportation Needs (01/20/2023)   PRAPARE - Administrator, Civil Service (Medical): No    Lack of Transportation (Non-Medical): No  Physical Activity: Insufficiently Active (01/20/2023)   Exercise Vital Sign    Days of Exercise per Week: 2 days    Minutes of Exercise per Session: 60 min  Stress: No Stress Concern Present (01/20/2023)   [employer] of Occupational Health - Occupational Stress Questionnaire    Feeling of Stress : Not at all  Social Connections: Socially Integrated (01/20/2023)   Social Connection and Isolation Panel [NHANES]    Frequency of Communication with Friends and Family: More than three times a week    Frequency of Social Gatherings with Friends and Family: Once a week    Attends Religious Services: More than 4 times per year    Active Member of [employer] or Organizations: Yes    Attends Banker Meetings: More than 4 times per year    Marital Status: Married    Activities of Daily Living    01/20/2023    3:13 PM  In your present state of health, do you have any difficulty performing the following activities:  Hearing? 1  Comment some hearing loss  Vision? 0  Difficulty concentrating or making decisions? 0  Walking or climbing stairs? 0  Dressing or bathing? 0  Doing errands, shopping? 0  Preparing Food and eating ? N  Using the Toilet? N  In the past six months, have you accidently leaked urine? Y  Comment stress incontinence  Do you have problems with loss of bowel control? N  Managing your Medications? N  Managing your Finances? N  Housekeeping or managing your Housekeeping? N    Patient Education/ Literacy How often do you need to have someone help you when you read instructions,  pamphlets, or other written materials from your doctor or pharmacy?: 1 - Never What is  the last grade level you completed in school?: three year diploma in nursing.  Exercise    Diet Patient reports consuming  2-3  meals a day and 2-3 snack(s) a day Patient reports that her primary diet is: Regular Patient reports that she does have regular access to food.   Depression Screen    01/20/2023    3:09 PM 12/16/2022   11:17 AM 01/17/2022    3:01 PM 06/20/2021   10:15 AM 04/05/2021    3:45 PM 10/03/2020   11:03 AM 05/28/2020   11:19 AM  PHQ 2/9 Scores  PHQ - 2 Score 0 0 0 0 0 0 0     Fall Risk    01/20/2023    3:09 PM 12/16/2022   11:17 AM 07/01/2022    4:17 PM 01/17/2022    3:01 PM 06/20/2021   10:15 AM  Fall Risk   Falls in the past year? 0 0 0 0 0  Number falls in past yr: 0 0 0 0 0  Injury with Fall? 0 0 0 0 0  Risk for fall due to : No Fall Risks No Fall Risks Impaired balance/gait;Impaired mobility No Fall Risks No Fall Risks  Follow up Falls evaluation completed Falls evaluation completed Falls evaluation completed Falls evaluation completed Falls prevention discussed     Objective:  PRUE SHEHATA seemed alert and oriented and she participated appropriately during our telephone visit.  Blood Pressure Weight BMI  BP Readings from Last 3 Encounters:  01/20/23 126/65  01/15/23 128/79  01/09/23 124/66   Wt Readings from Last 3 Encounters:  01/20/23 208 lb 11.2 oz (94.7 kg)  01/15/23 210 lb (95.3 kg)  01/09/23 211 lb (95.7 kg)   BMI Readings from Last 1 Encounters:  01/20/23 38.17 kg/m    *Unable to obtain current vital signs, weight, and BMI due to telephone visit type  Hearing/Vision  Dynetta did not seem to have difficulty with hearing/understanding during the telephone conversation Reports that she has not had a formal eye exam by an eye care professional within the past year Reports that she has not had a formal hearing evaluation within the past year *Unable to  fully assess hearing and vision during telephone visit type  Cognitive Function:    01/20/2023    3:21 PM 01/17/2022    3:08 PM 04/19/2019    3:05 PM 01/15/2017   11:07 AM 01/11/2016   11:05 AM  6CIT Screen  What Year? 0 points 0 points 0 points 0 points 0 points  What month? 0 points 0 points 0 points 0 points 0 points  What time? 0 points 0 points 0 points 0 points 0 points  Count back from 20 0 points 0 points 0 points 0 points 0 points  Months in reverse 0 points 0 points 0 points 0 points 0 points  Repeat phrase 0 points 0 points 0 points 2 points 0 points  Total Score 0 points 0 points 0 points 2 points 0 points   (Normal:0-7, Significant for Dysfunction: >8)  Normal Cognitive Function Screening: Yes   Immunization & Health Maintenance Record Immunization History  Administered Date(s) Administered   Fluad Quad(high Dose 65+) 12/29/2018, 12/26/2019   Fluad Trivalent(High Dose 65+) 12/16/2022   Influenza Split 01/27/2012, 01/01/2017   Influenza, High Dose Seasonal PF 01/29/2018, 12/23/2021   Influenza, Seasonal, Injecte, Preservative Fre 12/12/2008, 01/24/2010   Influenza,inj,Quad PF,6+ Mos 11/26/2012, 12/29/2013, 12/14/2014, 11/13/2015   Moderna Sars-Covid-2 Vaccination 06/14/2019, 07/12/2019, 01/24/2020   PNEUMOCOCCAL  CONJUGATE-20 06/20/2021   PPD Test 05/18/2018   Pneumococcal Conjugate-13 12/29/2013   Pneumococcal Polysaccharide-23 11/26/2012   Td (Adult),unspecified 03/13/2003   Tdap 07/02/2015   Zoster Recombinant(Shingrix) 06/11/2020, 09/19/2020   Zoster, Live 10/04/2008    Health Maintenance  Topic Date Due   COVID-19 Vaccine (4 - 2023-24 season) 02/05/2023 (Originally 11/09/2022)   Diabetic kidney evaluation - eGFR measurement  10/19/2023 (Originally 12/24/2022)   DEXA SCAN  01/20/2024 (Originally 02/26/2019)   Diabetic kidney evaluation - Urine ACR  12/16/2023   Medicare Annual Wellness (AWV)  01/20/2024   Colonoscopy  02/19/2025   DTaP/Tdap/Td (2 - Td or  Tdap) 07/01/2025   Pneumonia Vaccine 44+ Years old  Completed   INFLUENZA VACCINE  Completed   Hepatitis C Screening  Completed   Zoster Vaccines- Shingrix  Completed   HPV VACCINES  Aged Out       Assessment  This is a routine wellness examination for [employer].  Health Maintenance: Due or Overdue There are no preventive care reminders to display for this patient.   Martha Perkins does not need a referral for Community Assistance: Care Management:   no Social Work:    no Prescription Assistance:  no Nutrition/Diabetes Education:  no   Plan:  Personalized Goals  Goals Addressed               This Visit's Progress     Patient Stated (pt-stated)        Patient stated that she would like to loose 40 lbs by next October.       Personalized Health Maintenance & Screening Recommendations  Bone densitometry screening Kidney evaluation   Lung Cancer Screening Recommended: no (Low Dose CT Chest recommended if Age 69-80 years, 20 pack-year currently smoking OR have quit w/in past 15 years) Hepatitis C Screening recommended: no HIV Screening recommended: no  Advanced Directives: Written information was not prepared per patient's request.  Referrals & Orders Orders Placed This Encounter  Procedures   DEXAScan    Follow-up Plan Follow-up with Agapito Games, MD as planned Medicare wellness visit in one year.  Patient will access AVS on my chart.   I have personally reviewed and noted the following in the patient's chart:   Medical and social history Use of alcohol, tobacco or illicit drugs  Current medications and supplements Functional ability and status Nutritional status Physical activity Advanced directives List of other physicians Hospitalizations, surgeries, and ER visits in previous 12 months Vitals Screenings to include cognitive, depression, and falls Referrals and appointments  In addition, I have reviewed and discussed with Martha Perkins certain preventive protocols, quality metrics, and best practice recommendations. A written personalized care plan for preventive services as well as general preventive health recommendations is available and can be mailed to the patient at her request.      Modesto Charon, RN BSN  01/20/2023

## 2023-01-20 NOTE — Patient Instructions (Addendum)
MEDICARE ANNUAL WELLNESS VISIT Health Maintenance Summary and Written Plan of Care  [name] ,  Thank you for allowing me to perform your Medicare Annual Wellness Visit and for your ongoing commitment to your health.   Health Maintenance & Immunization History Health Maintenance  Topic Date Due   COVID-19 Vaccine (4 - 2023-24 season) 02/05/2023 (Originally 11/09/2022)   Diabetic kidney evaluation - eGFR measurement  10/19/2023 (Originally 12/24/2022)   DEXA SCAN  01/20/2024 (Originally 02/26/2019)   Diabetic kidney evaluation - Urine ACR  12/16/2023   Medicare Annual Wellness (AWV)  01/20/2024   Colonoscopy  02/19/2025   DTaP/Tdap/Td (2 - Td or Tdap) 07/01/2025   Pneumonia Vaccine 35+ Years old  Completed   INFLUENZA VACCINE  Completed   Hepatitis C Screening  Completed   Zoster Vaccines- Shingrix  Completed   HPV VACCINES  Aged Out   Immunization History  Administered Date(s) Administered   Fluad Quad(high Dose 65+) 12/29/2018, 12/26/2019   Fluad Trivalent(High Dose 65+) 12/16/2022   Influenza Split 01/27/2012, 01/01/2017   Influenza, High Dose Seasonal PF 01/29/2018, 12/23/2021   Influenza, Seasonal, Injecte, Preservative Fre 12/12/2008, 01/24/2010   Influenza,inj,Quad PF,6+ Mos 11/26/2012, 12/29/2013, 12/14/2014, 11/13/2015   Moderna Sars-Covid-2 Vaccination 06/14/2019, 07/12/2019, 01/24/2020   PNEUMOCOCCAL CONJUGATE-20 06/20/2021   PPD Test 05/18/2018   Pneumococcal Conjugate-13 12/29/2013   Pneumococcal Polysaccharide-23 11/26/2012   Td (Adult),unspecified 03/13/2003   Tdap 07/02/2015   Zoster Recombinant(Shingrix) 06/11/2020, 09/19/2020   Zoster, Live 10/04/2008    These are the patient goals that we discussed:  Goals Addressed               This Visit's Progress     Patient Stated (pt-stated)        Patient stated that she would like to loose 40 lbs by next October.         This is a list of Health Maintenance Items that are overdue or due now: Bone  densitometry screening Kidney evaluation  Orders/Referrals Placed Today: Orders Placed This Encounter  Procedures   DEXAScan    Standing Status:   Future    Standing Expiration Date:   01/20/2024    Scheduling Instructions:     Please call patient to schedule.    Order Specific Question:   Reason for exam:    Answer:   post menopausal    Order Specific Question:   Preferred imaging location?    Answer:   [facility]   (Contact our referral department at [phone] if you have not spoken with someone about your referral appointment within the next 5 days)    Follow-up Plan Follow-up with Agapito Games, MD as planned Medicare wellness visit in one year.  Patient will access AVS on my chart.      Health Maintenance, Female Adopting a healthy lifestyle and getting preventive care are important in promoting health and wellness. Ask your health care provider about: The right schedule for you to have regular tests and exams. Things you can do on your own to prevent diseases and keep yourself healthy. What should I know about diet, weight, and exercise? Eat a healthy diet  Eat a diet that includes plenty of vegetables, fruits, low-fat dairy products, and lean protein. Do not eat a lot of foods that are high in solid fats, added sugars, or sodium. Maintain a healthy weight Body mass index (BMI) is used to identify weight problems. It estimates body fat based on height and weight. Your health care provider can  help determine your BMI and help you achieve or maintain a healthy weight. Get regular exercise Get regular exercise. This is one of the most important things you can do for your health. Most adults should: Exercise for at least 150 minutes each week. The exercise should increase your heart rate and make you sweat (moderate-intensity exercise). Do strengthening exercises at least twice a week. This is in addition to the moderate-intensity exercise. Spend  less time sitting. Even light physical activity can be beneficial. Watch cholesterol and blood lipids Have your blood tested for lipids and cholesterol at 75 years of age, then have this test every 5 years. Have your cholesterol levels checked more often if: Your lipid or cholesterol levels are high. You are older than 75 years of age. You are at high risk for heart disease. What should I know about cancer screening? Depending on your health history and family history, you may need to have cancer screening at various ages. This may include screening for: Breast cancer. Cervical cancer. Colorectal cancer. Skin cancer. Lung cancer. What should I know about heart disease, diabetes, and high blood pressure? Blood pressure and heart disease High blood pressure causes heart disease and increases the risk of stroke. This is more likely to develop in people who have high blood pressure readings or are overweight. Have your blood pressure checked: Every 3-5 years if you are 54-36 years of age. Every year if you are 64 years old or older. Diabetes Have regular diabetes screenings. This checks your fasting blood sugar level. Have the screening done: Once every three years after age 86 if you are at a normal weight and have a low risk for diabetes. More often and at a younger age if you are overweight or have a high risk for diabetes. What should I know about preventing infection? Hepatitis B If you have a higher risk for hepatitis B, you should be screened for this virus. Talk with your health care provider to find out if you are at risk for hepatitis B infection. Hepatitis C Testing is recommended for: Everyone born from [birth year] through 1963. Anyone with known risk factors for hepatitis C. Sexually transmitted infections (STIs) Get screened for STIs, including gonorrhea and chlamydia, if: You are sexually active and are younger than 75 years of age. You are older than 75 years of age and your  health care provider tells you that you are at risk for this type of infection. Your sexual activity has changed since you were last screened, and you are at increased risk for chlamydia or gonorrhea. Ask your health care provider if you are at risk. Ask your health care provider about whether you are at high risk for HIV. Your health care provider may recommend a prescription medicine to help prevent HIV infection. If you choose to take medicine to prevent HIV, you should first get tested for HIV. You should then be tested every 3 months for as long as you are taking the medicine. Pregnancy If you are about to stop having your period (premenopausal) and you may become pregnant, seek counseling before you get pregnant. Take 400 to 800 micrograms (mcg) of folic acid every day if you become pregnant. Ask for birth control (contraception) if you want to prevent pregnancy. Osteoporosis and menopause Osteoporosis is a disease in which the bones lose minerals and strength with aging. This can result in bone fractures. If you are 26 years old or older, or if you are at risk for osteoporosis and  fractures, ask your health care provider if you should: Be screened for bone loss. Take a calcium or vitamin D supplement to lower your risk of fractures. Be given hormone replacement therapy (HRT) to treat symptoms of menopause. Follow these instructions at home: Alcohol use Do not drink alcohol if: Your health care provider tells you not to drink. You are pregnant, may be pregnant, or are planning to become pregnant. If you drink alcohol: Limit how much you have to: 0-1 drink a day. Know how much alcohol is in your drink. In the [place] one drink equals one 12 oz bottle of beer (355 mL), one 5 oz glass of wine (148 mL), or one 1 oz glass of hard liquor (44 mL). Lifestyle Do not use any products that contain nicotine or tobacco. These products include cigarettes, chewing tobacco, and vaping devices, such as  e-cigarettes. If you need help quitting, ask your health care provider. Do not use street drugs. Do not share needles. Ask your health care provider for help if you need support or information about quitting drugs. General instructions Schedule regular health, dental, and eye exams. Stay current with your vaccines. Tell your health care provider if: You often feel depressed. You have ever been abused or do not feel safe at home. Summary Adopting a healthy lifestyle and getting preventive care are important in promoting health and wellness. Follow your health care provider's instructions about healthy diet, exercising, and getting tested or screened for diseases. Follow your health care provider's instructions on monitoring your cholesterol and blood pressure. This information is not intended to replace advice given to you by your health care provider. Make sure you discuss any questions you have with your health care provider. Document Revised: 07/16/2020 Document Reviewed: 07/16/2020 Elsevier Patient Education  2024 [employer].

## 2023-01-26 ENCOUNTER — Ambulatory Visit
Admission: RE | Admit: 2023-01-26 | Discharge: 2023-01-26 | Disposition: A | Discharge status: Home / Self Care | Payer: Medicare Other | Source: Ambulatory Visit | Attending: Family Medicine | Admitting: Family Medicine

## 2023-01-26 ENCOUNTER — Other Ambulatory Visit: Payer: Self-pay | Admitting: Family Medicine

## 2023-01-26 DIAGNOSIS — R928 Other abnormal and inconclusive findings on diagnostic imaging of breast: Secondary | ICD-10-CM

## 2023-01-26 DIAGNOSIS — N6001 Solitary cyst of right breast: Secondary | ICD-10-CM | POA: Diagnosis not present

## 2023-01-26 DIAGNOSIS — N6002 Solitary cyst of left breast: Secondary | ICD-10-CM

## 2023-01-26 DIAGNOSIS — N6321 Unspecified lump in the left breast, upper outer quadrant: Secondary | ICD-10-CM | POA: Diagnosis not present

## 2023-01-29 ENCOUNTER — Encounter: Payer: Self-pay | Admitting: Family Medicine

## 2023-02-02 ENCOUNTER — Other Ambulatory Visit: Payer: Self-pay | Admitting: Family Medicine

## 2023-02-02 DIAGNOSIS — I10 Essential (primary) hypertension: Secondary | ICD-10-CM

## 2023-02-11 ENCOUNTER — Other Ambulatory Visit: Payer: Medicare Other

## 2023-02-16 ENCOUNTER — Other Ambulatory Visit: Payer: Self-pay | Admitting: Family Medicine

## 2023-02-16 ENCOUNTER — Encounter (HOSPITAL_BASED_OUTPATIENT_CLINIC_OR_DEPARTMENT_OTHER): Payer: Self-pay | Admitting: Cardiovascular Disease

## 2023-02-16 ENCOUNTER — Ambulatory Visit (HOSPITAL_BASED_OUTPATIENT_CLINIC_OR_DEPARTMENT_OTHER): Payer: Medicare Other | Admitting: Cardiovascular Disease

## 2023-02-16 VITALS — BP 130/80 | HR 80 | Ht 62.0 in | Wt 210.0 lb

## 2023-02-16 DIAGNOSIS — E785 Hyperlipidemia, unspecified: Secondary | ICD-10-CM

## 2023-02-16 DIAGNOSIS — M51369 Other intervertebral disc degeneration, lumbar region without mention of lumbar back pain or lower extremity pain: Secondary | ICD-10-CM

## 2023-02-16 DIAGNOSIS — I480 Paroxysmal atrial fibrillation: Secondary | ICD-10-CM | POA: Diagnosis not present

## 2023-02-16 DIAGNOSIS — G8929 Other chronic pain: Secondary | ICD-10-CM

## 2023-02-16 DIAGNOSIS — I700 Atherosclerosis of aorta: Secondary | ICD-10-CM

## 2023-02-16 DIAGNOSIS — I10 Essential (primary) hypertension: Secondary | ICD-10-CM

## 2023-02-16 NOTE — Patient Instructions (Signed)
Medication Instructions:  Your physician recommends that you continue on your current medications as directed. Please refer to the Current Medication list given to you today.  *If you need a refill on your cardiac medications before your next appointment, please call your pharmacy*  Lab Work: HAVE YOUR PHYSICIAN SEND WHEN YOU HAVE DONE  Testing/Procedures: NONE  Follow-Up: At [facility], you and your health needs are our priority.  As part of our continuing mission to provide you with exceptional heart care, we have created designated Provider Care Teams.  These Care Teams include your primary Cardiologist (physician) and Advanced Practice Providers (APPs -  Physician Assistants and Nurse Practitioners) who all work together to provide you with the care you need, when you need it.  We recommend signing up for the patient portal called "MyChart".  Sign up information is provided on this After Visit Summary.  MyChart is used to connect with patients for Virtual Visits (Telemedicine).  Patients are able to view lab/test results, encounter notes, upcoming appointments, etc.  Non-urgent messages can be sent to your provider as well.   To learn more about what you can do with MyChart, go to [email or web address].    Your next appointment:   12 month(s)  Provider:   Chilton Si, MD or Gillian Shields, NP

## 2023-02-16 NOTE — Progress Notes (Unsigned)
Cardiology Office Note:  .   Date:  02/16/2023  ID:  Martha Perkins, DOB 04-15-1945, MRN [id number] PCP: Agapito Games, MD  [facility] Providers Cardiologist:  None { Click to update primary MD,subspecialty MD or APP then REFRESH:1}   History of Present Illness: Martha Perkins is a 77 y.o. female  with paroxysmal atrial fibrillation, Grave's disease, hypertension, white coat hypertension, hyperlipidemia, lymphedema, and fatty liver disease who presents follow up on atrial fibrillation.  Martha Perkins was seen by her PCP, Dr. Nani Gasser, on 07/13/15 for newly-diagnosed atrial fibrillation.  She was started on Eliquis and a low-dose beta blocker. Lab work revealed that she was hyperthyroid.  She had a thyroid uptake scan that revealed an enlarged, hyperfunctioning left lobe of the thyroid gland without discrete nodule. Findings were thought to be consistent with Graves' disease. She also underwent radioactive iodine therapy. She was seen in clinic 08/09/15 and metoprolol was increased.  Lisinopril was discontinued. She had an echo 08/2015 that revealed LVEF 50-55% with moderate left atrial enlargement, mild MR and TR.  Martha Perkins underwent cardioversion on 08/05/16.     She was started on Flovent inhaler because she was having to use her albuterol more frequently.  She thinks that this helped her breathing.  She saw Corine Shelter 10/2019 and noted some palpitations.  She wore a monitor that showed short runs of SVT but no atrial fibrillation, so anticoagulation was discontinued.  At the last visit diltiazem was discontinued due to LE edema and the fact that she was maintaining sinus rhythm. Her blood pressure was elevated so valsartan was added.   At her visit 11/2021 she was undergoing treatment for lymphedema.  She was forgetting her evening dose of spironolactone and was recommended that she take that in the morning instead.  She stopped taking her statin and started a krill oil  supplement.  Lipids   History of Present Illness            ROS:  As per HPI  Studies Reviewed: .         Risk Assessment/Calculations:             Physical Exam:   VS:  BP 130/80 (BP Location: Right Arm, Patient Position: Sitting, Cuff Size: Large)   Pulse 80   Ht 5\' 2"  (1.575 m)   Wt 210 lb (95.3 kg)   SpO2 97%   BMI 38.41 kg/m  , BMI Body mass index is 38.41 kg/m. GENERAL:  Well appearing HEENT: Pupils equal round and reactive, fundi not visualized, oral mucosa unremarkable NECK:  No jugular venous distention, waveform within normal limits, carotid upstroke brisk and symmetric, no bruits, no thyromegaly LUNGS:  Clear to auscultation bilaterally HEART:  RRR.  PMI not displaced or sustained,S1 and S2 within normal limits, no S3, no S4, no clicks, no rubs, no murmurs ABD:  Flat, positive bowel sounds normal in frequency in pitch, no bruits, no rebound, no guarding, no midline pulsatile mass, no hepatomegaly, no splenomegaly EXT:  2 plus pulses throughout, no edema, no cyanosis no clubbing SKIN:  No rashes no nodules NEURO:  Cranial nerves II through XII grossly intact, motor grossly intact throughout PSYCH:  Cognitively intact, oriented to person place and time   ASSESSMENT AND PLAN: .   *** Assessment and Plan                 {Are you ordering a CV Procedure (e.g. stress test, cath,  DCCV, TEE, etc)?   Press F2        :[id number]}  Dispo: ***  Signed, Chilton Si, MD

## 2023-02-19 ENCOUNTER — Other Ambulatory Visit: Payer: Self-pay | Admitting: Family Medicine

## 2023-02-19 DIAGNOSIS — M51369 Other intervertebral disc degeneration, lumbar region without mention of lumbar back pain or lower extremity pain: Secondary | ICD-10-CM

## 2023-02-19 DIAGNOSIS — G8929 Other chronic pain: Secondary | ICD-10-CM

## 2023-02-20 ENCOUNTER — Telehealth: Payer: Self-pay

## 2023-02-20 DIAGNOSIS — G8929 Other chronic pain: Secondary | ICD-10-CM

## 2023-02-20 DIAGNOSIS — M51369 Other intervertebral disc degeneration, lumbar region without mention of lumbar back pain or lower extremity pain: Secondary | ICD-10-CM

## 2023-02-20 MED ORDER — TRAMADOL HCL 50 MG PO TABS: 50.0000 mg | ORAL_TABLET | Freq: Three times a day (TID) | ORAL | 0 refills | Status: DC | PRN

## 2023-02-20 NOTE — Telephone Encounter (Signed)
Requesting rx rf of Tramadol Last written 12/12/2022 Last OV 01/20/2023 Upcoming appt 03/24/2023

## 2023-02-20 NOTE — Telephone Encounter (Signed)
Patient informed. 

## 2023-02-20 NOTE — Telephone Encounter (Signed)
Copied from CRM [id number]. Topic: Clinical - Prescription Issue >> Feb 20, 2023 11:57 AM Hector Shade B wrote: Reason for CRM: Patient called in regards to the refill request, patient stated she called to get a refill sent in to the pharmacy, she was told that it was going to be sent, when she called this morning the pharmacy stated they had not received anything from provider

## 2023-02-20 NOTE — Telephone Encounter (Signed)
Meds ordered this encounter  Medications   traMADol (ULTRAM) 50 MG tablet    Sig: Take 1 tablet (50 mg total) by mouth 3 (three) times daily as needed.    Dispense:  90 tablet    Refill:  0    This prescription was filled on 12/08/2022. Any refills authorized will be placed on file.

## 2023-02-23 ENCOUNTER — Other Ambulatory Visit: Payer: Self-pay | Admitting: Family Medicine

## 2023-02-23 DIAGNOSIS — I10 Essential (primary) hypertension: Secondary | ICD-10-CM

## 2023-02-25 ENCOUNTER — Ambulatory Visit: Payer: Medicare Other

## 2023-02-25 DIAGNOSIS — Z780 Asymptomatic menopausal state: Secondary | ICD-10-CM

## 2023-02-25 DIAGNOSIS — E88819 Insulin resistance, unspecified: Secondary | ICD-10-CM | POA: Diagnosis not present

## 2023-02-25 DIAGNOSIS — I10 Essential (primary) hypertension: Secondary | ICD-10-CM | POA: Diagnosis not present

## 2023-02-25 DIAGNOSIS — Z0000 Encounter for general adult medical examination without abnormal findings: Secondary | ICD-10-CM

## 2023-02-25 DIAGNOSIS — E785 Hyperlipidemia, unspecified: Secondary | ICD-10-CM | POA: Diagnosis not present

## 2023-02-25 NOTE — Progress Notes (Signed)
HI Laurissa, your bone density shows T score of -0.8 which is considered normal. That is awesome!!!!

## 2023-02-26 LAB — CMP14+EGFR
ALT: 27 [IU]/L (ref 0–32)
AST: 22 [IU]/L (ref 0–40)
Albumin: 4.3 g/dL (ref 3.8–4.8)
Alkaline Phosphatase: 65 [IU]/L (ref 44–121)
BUN/Creatinine Ratio: 20 (ref 12–28)
BUN: 17 mg/dL (ref 8–27)
Bilirubin Total: 0.5 mg/dL (ref 0.0–1.2)
CO2: 21 mmol/L (ref 20–29)
Calcium: 9.5 mg/dL (ref 8.7–10.3)
Chloride: 104 mmol/L (ref 96–106)
Creatinine, Ser: 0.87 mg/dL (ref 0.57–1.00)
Globulin, Total: 2.6 g/dL (ref 1.5–4.5)
Glucose: 105 mg/dL — ABNORMAL HIGH (ref 70–99)
Potassium: 4.8 mmol/L (ref 3.5–5.2)
Sodium: 140 mmol/L (ref 134–144)
Total Protein: 6.9 g/dL (ref 6.0–8.5)
eGFR: 69 mL/min/{1.73_m2} (ref >59)

## 2023-02-26 LAB — LIPID PANEL
Chol/HDL Ratio: 5.3 {ratio} — ABNORMAL HIGH (ref 0.0–4.4)
Cholesterol, Total: 281 mg/dL — ABNORMAL HIGH (ref 100–199)
HDL: 53 mg/dL (ref >39)
LDL Chol Calc (NIH): 195 mg/dL — ABNORMAL HIGH (ref 0–99)
Triglycerides: 174 mg/dL — ABNORMAL HIGH (ref 0–149)
VLDL Cholesterol Cal: 33 mg/dL (ref 5–40)

## 2023-02-26 LAB — TSH: TSH: 5.33 u[IU]/mL — ABNORMAL HIGH (ref 0.450–4.500)

## 2023-03-02 ENCOUNTER — Other Ambulatory Visit: Payer: Self-pay | Admitting: Family Medicine

## 2023-03-02 NOTE — Progress Notes (Signed)
HI Saline, thyroid level is off it looked great 7 months ago but is elevated.  Did you run out of medication or miss a few doses recently.  If not then we may need to adjust your medication please just let me know.  Your liver function looks much better!  Triglycerides is are a little better.  LDL still very high.  Still like you to consider taking a statin to lower your cholesterol as well as to lower your cardiovascular risk for heart attack and stroke.

## 2023-03-05 NOTE — Progress Notes (Signed)
Okay, take an extra half a tab per week on the thyroid pill.  So for example take 1-1/2 tabs on Sundays and then just a whole tab Monday through Saturday and then repeat weekly.  If you are taking the medication a little differently then let me know.  But we can plan to recheck your level in about 8 weeks.  It may have been from switching brands as they are not 100% equivalent.

## 2023-03-10 ENCOUNTER — Encounter (HOSPITAL_BASED_OUTPATIENT_CLINIC_OR_DEPARTMENT_OTHER): Payer: Self-pay | Admitting: Cardiovascular Disease

## 2023-03-10 ENCOUNTER — Other Ambulatory Visit: Payer: Self-pay | Admitting: *Deleted

## 2023-03-10 DIAGNOSIS — E890 Postprocedural hypothyroidism: Secondary | ICD-10-CM

## 2023-03-23 ENCOUNTER — Other Ambulatory Visit: Payer: Self-pay | Admitting: Family Medicine

## 2023-03-23 DIAGNOSIS — M51369 Other intervertebral disc degeneration, lumbar region without mention of lumbar back pain or lower extremity pain: Secondary | ICD-10-CM

## 2023-03-23 DIAGNOSIS — G8929 Other chronic pain: Secondary | ICD-10-CM

## 2023-03-24 ENCOUNTER — Ambulatory Visit: Payer: Medicare Other | Admitting: Family Medicine

## 2023-03-26 NOTE — Telephone Encounter (Signed)
Last OV  12/16/2022 Pain contract and UDS are UTD No follow up appt for pain medication has been made.

## 2023-04-10 DIAGNOSIS — N200 Calculus of kidney: Secondary | ICD-10-CM | POA: Diagnosis not present

## 2023-04-10 DIAGNOSIS — K760 Fatty (change of) liver, not elsewhere classified: Secondary | ICD-10-CM | POA: Diagnosis not present

## 2023-04-10 DIAGNOSIS — K5900 Constipation, unspecified: Secondary | ICD-10-CM | POA: Diagnosis not present

## 2023-04-10 DIAGNOSIS — K5904 Chronic idiopathic constipation: Secondary | ICD-10-CM | POA: Diagnosis not present

## 2023-04-10 DIAGNOSIS — R1084 Generalized abdominal pain: Secondary | ICD-10-CM | POA: Diagnosis not present

## 2023-05-04 ENCOUNTER — Other Ambulatory Visit: Payer: Self-pay | Admitting: Family Medicine

## 2023-05-04 DIAGNOSIS — E890 Postprocedural hypothyroidism: Secondary | ICD-10-CM

## 2023-05-05 ENCOUNTER — Other Ambulatory Visit: Payer: Self-pay | Admitting: Family Medicine

## 2023-05-05 DIAGNOSIS — M51369 Other intervertebral disc degeneration, lumbar region without mention of lumbar back pain or lower extremity pain: Secondary | ICD-10-CM

## 2023-05-05 DIAGNOSIS — I10 Essential (primary) hypertension: Secondary | ICD-10-CM

## 2023-05-05 DIAGNOSIS — G8929 Other chronic pain: Secondary | ICD-10-CM

## 2023-05-07 ENCOUNTER — Other Ambulatory Visit: Payer: Self-pay | Admitting: Family Medicine

## 2023-05-07 DIAGNOSIS — G8929 Other chronic pain: Secondary | ICD-10-CM

## 2023-05-07 DIAGNOSIS — I10 Essential (primary) hypertension: Secondary | ICD-10-CM

## 2023-05-07 DIAGNOSIS — M51369 Other intervertebral disc degeneration, lumbar region without mention of lumbar back pain or lower extremity pain: Secondary | ICD-10-CM

## 2023-05-07 NOTE — Telephone Encounter (Signed)
 Requesting rx rf of  Tramadol 50mg  last written 03/26/2023 Spironolactone 25mg  last written 02/08/2023 Last OV 01/20/2023 Upcoming appt =01/25/2024

## 2023-05-07 NOTE — Telephone Encounter (Signed)
 Last Fill: Tramadol: 03/26/23 90 tabs/0 RF     Spironolactone: 02/08/23  Last OV: 01/20/23 Next OV: 01/25/24 AWV  Routing to provider for review/authorization.

## 2023-05-07 NOTE — Telephone Encounter (Signed)
 Copied from CRM [id number]. Topic: Clinical - Medication Refill >> May 07, 2023 12:05 PM Elle L wrote: Most Recent Primary Care Visit:  Provider: Nani Gasser D  Department: [facility]  Visit Type: MEDICARE AWV, SEQUENTIAL  Date: 01/20/2023  Medication: traMADol (ULTRAM) 50 MG tablet AND spironolactone (ALDACTONE) 25 MG tablet  Has the patient contacted their pharmacy? Yes  Is this the correct pharmacy for this prescription? Yes If no, delete pharmacy and type the correct one.  This is the patient's preferred pharmacy:   [facility] [place], [place] - [address] [place] [address] Phone: [phone] Fax: [phone]  Has the prescription been filled recently? No  Is the patient out of the medication? Yes  Has the patient been seen for an appointment in the last year OR does the patient have an upcoming appointment? Yes  Can we respond through MyChart? No  Agent: Please be advised that Rx refills may take up to 3 business days. We ask that you follow-up with your pharmacy.

## 2023-05-08 MED ORDER — TRAMADOL HCL 50 MG PO TABS: 50.0000 mg | ORAL_TABLET | Freq: Three times a day (TID) | ORAL | 0 refills | Status: DC | PRN

## 2023-05-08 MED ORDER — SPIRONOLACTONE 25 MG PO TABS: 25.0000 mg | ORAL_TABLET | Freq: Every day | ORAL | 1 refills | Status: DC

## 2023-06-04 ENCOUNTER — Ambulatory Visit: Payer: Self-pay

## 2023-06-04 NOTE — Telephone Encounter (Signed)
 Copied from CRM [id number]. Topic: Clinical - Red Word Triage >> Jun 04, 2023  8:47 AM Izetta Dakin wrote: [facility] that prompted transfer to Nurse Triage: burning with urination, abdominal cramping  Chief Complaint: urination s/s Symptoms: pain and burning with urination, abd cramping, increased frequency and urgency Frequency: this morning Pertinent Negatives: Patient denies fever or blood in urine Disposition: [] ED /[] Urgent Care (no appt availability in office) / [x] Appointment(In office/virtual)/ []  [place] [facility]/ [] Home Care/ [] Refused Recommended Disposition /[] [place] [facility]/ []  Follow-up with PCP Additional Notes: pt stated has had this issue in the past and wants to be evaluated for UTI and need for ABX.  Reason for Disposition  Side (flank) or lower back pain present  Answer Assessment - Initial Assessment Questions 1. SYMPTOM: "What's the main symptom you're concerned about?" (e.g., frequency, incontinence)     Increased need to urinate, frequency and urgency, burning with urination 2. ONSET: "When did the  burning   start?"     This morning 3. PAIN: "Is there any pain?" If Yes, ask: "How bad is it?" (Scale: 1-10; mild, moderate, severe)     Burining with urination and abd cramping 4. CAUSE: "What do you think is causing the symptoms?"     Possible UTI 5. OTHER SYMPTOMS: "Do you have any other symptoms?" (e.g., blood in urine, fever, flank pain, pain with urination)     no 6. PREGNANCY: "Is there any chance you are pregnant?" "When was your last menstrual period?"     N/a  Protocols used: Urinary Symptoms-A-AH

## 2023-06-04 NOTE — Telephone Encounter (Signed)
 Patient was updated of the provider's note. Per patient, she will keep tomorrow's appt with Joy due to lack of transportation for this afternoon.

## 2023-06-05 ENCOUNTER — Ambulatory Visit (INDEPENDENT_AMBULATORY_CARE_PROVIDER_SITE_OTHER): Admitting: Medical-Surgical

## 2023-06-05 ENCOUNTER — Encounter: Payer: Self-pay | Admitting: Medical-Surgical

## 2023-06-05 VITALS — BP 132/62 | HR 86 | Resp 20 | Ht 62.0 in | Wt 214.0 lb

## 2023-06-05 DIAGNOSIS — L600 Ingrowing nail: Secondary | ICD-10-CM

## 2023-06-05 DIAGNOSIS — R300 Dysuria: Secondary | ICD-10-CM | POA: Diagnosis not present

## 2023-06-05 DIAGNOSIS — E890 Postprocedural hypothyroidism: Secondary | ICD-10-CM | POA: Diagnosis not present

## 2023-06-05 DIAGNOSIS — N3001 Acute cystitis with hematuria: Secondary | ICD-10-CM | POA: Diagnosis not present

## 2023-06-05 LAB — POCT URINALYSIS DIP (CLINITEK)
Bilirubin, UA: NEGATIVE
Glucose, UA: NEGATIVE mg/dL
Ketones, POC UA: NEGATIVE mg/dL
Nitrite, UA: NEGATIVE
POC PROTEIN,UA: NEGATIVE
Spec Grav, UA: 1.01 (ref 1.010–1.025)
Urobilinogen, UA: 0.2 U/dL
pH, UA: 6 (ref 5.0–8.0)

## 2023-06-05 MED ORDER — SULFAMETHOXAZOLE-TRIMETHOPRIM 800-160 MG PO TABS
1.0000 | ORAL_TABLET | Freq: Two times a day (BID) | ORAL | 0 refills | Status: DC
Start: 2023-06-05 — End: 2023-06-22

## 2023-06-05 MED ORDER — PHENAZOPYRIDINE HCL 200 MG PO TABS: 200.0000 mg | ORAL_TABLET | Freq: Three times a day (TID) | ORAL | 0 refills | Status: DC | PRN

## 2023-06-05 NOTE — Progress Notes (Signed)
        Established patient visit  History, exam, impression, and plan:  1. Dysuria (Primary) Very pleasant 78 year old female presenting today with a history of burning, frequency, urgency, bladder spasms, chills, and cloudy urine.  Her symptoms developed yesterday.  She started hydrating and drinking cranberry juice as well as tart cherry juice.  She has taken ibuprofen and tramadol that is prescribed for chronic pain.  Notes temporary improvement in symptoms but is still having them this morning.  Admits that she is usually bad about staying hydrated.  She does have a history of kidney stones.  Denies fever, nausea, vomiting.  POCT urinalysis positive for moderate blood and small leukocytes.  Sending for culture. - POCT URINALYSIS DIP (CLINITEK) - Urine Culture  2. Acute cystitis with hematuria Plan to treat empirically in the setting of known kidney stones and severe acute symptoms.  We will do Bactrim twice daily for 3 days.  Adding Pyridium 3 times daily as needed.  Once culture is available, we will determine if this is adequate or if we need to do something different for antibiotics. - sulfamethoxazole-trimethoprim (BACTRIM DS) 800-160 MG tablet; Take 1 tablet by mouth 2 (two) times daily.  Dispense: 6 tablet; Refill: 0 - phenazopyridine (PYRIDIUM) 200 MG tablet; Take 1 tablet (200 mg total) by mouth 3 (three) times daily as needed for pain.  Dispense: 15 tablet; Refill: 0  3. Ingrown toenail Reports that she has an ingrown toenail on the left great toe that has been present for a while.  Notes that it is now swollen and painful along the sides of the nail and that the toenail puts a lot of pressure at the distal corners.  Saw podiatry a few years ago but has not been back since.  On evaluation, she has significant curving of the great toe that is pushing down into the lateral nail borders.  Suspect fungal disease as well.  Lateral nail border and proximal left nail border erythematous  without fluctuance or visible pus.  Suspect beginning of a paronychia.  Discussed ways to manage ingrown toenails at home.  Reviewed use of dental floss as well as small pieces of cotton.  Reviewed patches available to help with ingrown toenails.  Unfortunately, I think this is severe enough that the patches will not help.  Discussed referral to podiatry but she would like to hold off on this today and try some of the home remedies.  Bactrim was prescribed for her UTI which should help with paronychia.  Recommend continuing to monitor and if this worsens, return for further evaluation.   Procedures performed this visit: None.  Return if symptoms worsen or fail to improve.  __________________________________ Thayer Ohm, DNP, APRN, FNP-BC Primary Care and Sports Medicine [facility] [place]

## 2023-06-06 ENCOUNTER — Encounter: Payer: Self-pay | Admitting: Family Medicine

## 2023-06-06 LAB — TSH: TSH: 1.74 u[IU]/mL (ref 0.450–4.500)

## 2023-06-06 NOTE — Progress Notes (Signed)
Thyroid looks perfect!

## 2023-06-09 ENCOUNTER — Encounter: Payer: Self-pay | Admitting: Family Medicine

## 2023-06-10 ENCOUNTER — Encounter: Payer: Self-pay | Admitting: Medical-Surgical

## 2023-06-10 ENCOUNTER — Encounter: Payer: Self-pay | Admitting: Family Medicine

## 2023-06-10 LAB — URINE CULTURE

## 2023-06-10 MED ORDER — NITROFURANTOIN MONOHYD MACRO 100 MG PO CAPS
100.0000 mg | ORAL_CAPSULE | Freq: Two times a day (BID) | ORAL | 0 refills | Status: DC
Start: 2023-06-10 — End: 2023-06-22

## 2023-06-10 NOTE — Addendum Note (Signed)
 Addended byChristen Butter on: 06/10/2023 02:03 PM   Modules accepted: Orders

## 2023-06-10 NOTE — Telephone Encounter (Signed)
 Spoke with patient . Very frustrated [facility]. She will pick up the prescription at [facility] pharmacy tomorrow .

## 2023-06-11 ENCOUNTER — Other Ambulatory Visit: Payer: Self-pay | Admitting: Family Medicine

## 2023-06-11 ENCOUNTER — Ambulatory Visit: Payer: Self-pay | Admitting: Family Medicine

## 2023-06-11 DIAGNOSIS — N3001 Acute cystitis with hematuria: Secondary | ICD-10-CM

## 2023-06-11 NOTE — Telephone Encounter (Signed)
 Chief Complaint: UTI  Symptoms: Urinary pressure, cramping; better than last week Frequency: Intermittent Pertinent Negatives: Patient denies blood in urine, flank pain  Disposition:  [x]  Follow-up with PCP  Additional Notes: Pt was seen in office on 3/28 by Christen Butter, NP, for a UTI. Pt was prescribed an abx but pt is still having symptoms. Pt reached out this week and was sent a new abx. Pt wants a refill of pyridium sent to [facility]. The agent stated she would send over this request.  Copied from CRM [id number]. Topic: Clinical - Red Word Triage >> Jun 11, 2023  3:17 PM Prudencio Pair wrote: Red Word that prompted transfer to Nurse Triage: Patient states she saw Dr. Larinda Buttery last week for a UTI but is still in pain. Patient states she needs a new refill of the medication Pyridium 200 mg and needs it sent to another pharmacy, [facility], in [place]. Reason for Disposition  [1] Can't control passage of urine (i.e., urinary incontinence, wetting self) AND [2] present > 2 weeks  Answer Assessment - Initial Assessment Questions SYMPTOM: "What's the main symptom you're concerned about?" (e.g., frequency, incontinence)     Urinary pressure, cramping, unable to empty bladder ONSET: "When did the  urinary symptoms  start?"     Last week OTHER SYMPTOMS: "Do you have any other symptoms?" (e.g., blood in urine, fever, flank pain, pain with urination)     Pressure  Protocols used: Urinary Symptoms-A-AH

## 2023-06-11 NOTE — Telephone Encounter (Unsigned)
 Copied from CRM [id number]. Topic: Clinical - Medication Refill >> Jun 11, 2023  3:22 PM Prudencio Pair wrote: Most Recent Primary Care Visit:  Provider: Christen Butter  Department: [facility]  Visit Type: ACUTE  Date: 06/05/2023  Medication: phenazopyridine (PYRIDIUM) 200 MG tablet  Has the patient contacted their pharmacy? Yes (Agent: If no, request that the patient contact the pharmacy for the refill. If patient does not wish to contact the pharmacy document the reason why and proceed with request.) (Agent: If yes, when and what did the pharmacy advise?)  Is this the correct pharmacy for this prescription? Yes If no, delete pharmacy and type the correct one.  This is the patient's preferred pharmacy:   [facility] [place], [place] - [address] [place] [address] Phone: [phone] Fax: [phone]   Has the prescription been filled recently? Yes  Is the patient out of the medication? Yes  Has the patient been seen for an appointment in the last year OR does the patient have an upcoming appointment? Yes  Can we respond through MyChart? Yes  Agent: Please be advised that Rx refills may take up to 3 business days. We ask that you follow-up with your pharmacy.

## 2023-06-12 MED ORDER — PHENAZOPYRIDINE HCL 200 MG PO TABS: 200.0000 mg | ORAL_TABLET | Freq: Three times a day (TID) | ORAL | 0 refills | Status: DC | PRN

## 2023-06-12 NOTE — Telephone Encounter (Signed)
 Reqeusting rx rf of pyridium  Last written 06/05/2023 Last OV 06/05/2023- with Christen Butter, NP Upcoming appt -01/28/2024- [facility]

## 2023-06-22 ENCOUNTER — Encounter: Payer: Self-pay | Admitting: Family Medicine

## 2023-06-22 ENCOUNTER — Ambulatory Visit (INDEPENDENT_AMBULATORY_CARE_PROVIDER_SITE_OTHER): Admitting: Family Medicine

## 2023-06-22 ENCOUNTER — Ambulatory Visit: Payer: Self-pay

## 2023-06-22 VITALS — BP 132/59 | HR 89 | Ht 62.0 in | Wt 217.0 lb

## 2023-06-22 DIAGNOSIS — I10 Essential (primary) hypertension: Secondary | ICD-10-CM | POA: Diagnosis not present

## 2023-06-22 DIAGNOSIS — G8929 Other chronic pain: Secondary | ICD-10-CM | POA: Diagnosis not present

## 2023-06-22 DIAGNOSIS — R300 Dysuria: Secondary | ICD-10-CM

## 2023-06-22 DIAGNOSIS — R252 Cramp and spasm: Secondary | ICD-10-CM

## 2023-06-22 DIAGNOSIS — M51369 Other intervertebral disc degeneration, lumbar region without mention of lumbar back pain or lower extremity pain: Secondary | ICD-10-CM | POA: Diagnosis not present

## 2023-06-22 LAB — POCT URINALYSIS DIP (CLINITEK)
Bilirubin, UA: NEGATIVE
Glucose, UA: NEGATIVE mg/dL
Ketones, POC UA: NEGATIVE mg/dL
Nitrite, UA: POSITIVE — AB
POC PROTEIN,UA: NEGATIVE
Spec Grav, UA: 1.01 (ref 1.010–1.025)
Urobilinogen, UA: 0.2 U/dL
pH, UA: 7 (ref 5.0–8.0)

## 2023-06-22 MED ORDER — TRAMADOL HCL 50 MG PO TABS
50.0000 mg | ORAL_TABLET | Freq: Three times a day (TID) | ORAL | 0 refills | Status: AC | PRN
Start: 2023-06-22 — End: ?

## 2023-06-22 MED ORDER — NITROFURANTOIN MONOHYD MACRO 100 MG PO CAPS: 100.0000 mg | ORAL_CAPSULE | Freq: Two times a day (BID) | ORAL | 0 refills | Status: DC

## 2023-06-22 MED ORDER — PHENAZOPYRIDINE HCL 200 MG PO TABS: 200.0000 mg | ORAL_TABLET | Freq: Three times a day (TID) | ORAL | 2 refills | Status: DC | PRN

## 2023-06-22 NOTE — Progress Notes (Signed)
 Established Patient Office Visit  Subjective  Patient ID: Martha Perkins, female    DOB: 1945/07/16  Age: 78 y.o. MRN: [id number]  Chief Complaint  Patient presents with   Urinary Tract Infection    HPI  She was seen March 28 by one of my partners for dysuria burning frequency and urgency.  She was diagnosed with acute cystitis and treated with Bactrim DS.  Urine culture was positive for E. coli.  The culture showed that it was resistant to Bactrim so they had to switch her to nitrofurantoin.  She did feel better after the antibiotics but within a couple of days started to get symptoms again of dysuria.  No fever or chills.  She does have a history of kidney stones.  But says this feels little different.  She would like a refill on the Pyridium.  Still struggling with leg cramps mostly in her calves she says sometimes it will wake her up almost every hour at night and is sometimes when she first gets up in the morning she will have cramping.  She had some potassium left over surgical on a couple of days ago just to see if it would help she actually takes magnesium daily.  She tries to stay hydrated most days.      ROS    Objective:     BP (!) 132/59   Pulse 89   Ht 5\' 2"  (1.575 m)   Wt 217 lb (98.4 kg)   SpO2 97%   BMI 39.69 kg/m    Physical Exam Vitals reviewed.  Constitutional:      Appearance: Normal appearance.  HENT:     Head: Normocephalic.  Pulmonary:     Effort: Pulmonary effort is normal.  Neurological:     Mental Status: She is alert and oriented to person, place, and time.  Psychiatric:        Mood and Affect: Mood normal.        Behavior: Behavior normal.      Results for orders placed or performed in visit on 06/22/23  POCT URINALYSIS DIP (CLINITEK)  Result Value Ref Range   Color, UA yellow yellow   Clarity, UA clear clear   Glucose, UA negative negative mg/dL   Bilirubin, UA negative negative   Ketones, POC UA negative negative mg/dL   Spec  Grav, UA 4.098 1.010 - 1.025   Blood, UA trace-lysed (A) negative   pH, UA 7.0 5.0 - 8.0   POC PROTEIN,UA negative negative, trace   Urobilinogen, UA 0.2 0.2 or 1.0 E.U./dL   Nitrite, UA Positive (A) Negative   Leukocytes, UA Small (1+) (A) Negative      The 10-year ASCVD risk score (Arnett DK, et al., 2019) is: 22.9%    Assessment & Plan:   Problem List Items Addressed This Visit       Cardiovascular and Mediastinum   Essential hypertension, goal Less than 130/85   Blood pressure little borderline today but will continue to monitor.        Musculoskeletal and Integument   Lumbar degenerative disc disease   Relevant Medications   traMADol (ULTRAM) 50 MG tablet     Other   Encounter for chronic pain management   Relevant Medications   traMADol (ULTRAM) 50 MG tablet   Other Visit Diagnoses       Dysuria    -  Primary   Relevant Medications   nitrofurantoin, macrocrystal-monohydrate, (MACROBID) 100 MG capsule   phenazopyridine (PYRIDIUM) 200  MG tablet   Other Relevant Orders   POCT URINALYSIS DIP (CLINITEK) (Completed)   Urine Culture     Leg cramping       Relevant Orders   CMP14+EGFR   Magnesium     She still having persistent symptoms so repeated urinalysis as well as will send for culture and gonorrhea send another prescription for nitrofurantoin for) based on prior culture.  If it comes back growing out the same thing when then we may need to send her to urology to figure out why she is harboring bacteria.  Regards to the leg cramps we will check for electrolytes we did discuss wearing good supportive shoe wear and that maybe not sleeping in the recliner could be helpful.  I want her to try sleeping in her own bed again for the next week or 2 just to see if that makes a difference.  No follow-ups on file.    Duaine German, MD

## 2023-06-22 NOTE — Assessment & Plan Note (Signed)
 Blood pressure little borderline today but will continue to monitor.

## 2023-06-22 NOTE — Telephone Encounter (Signed)
 Copied from CRM [id number]. Topic: Clinical - Red Word Triage >> Jun 22, 2023  8:08 AM Martha Perkins wrote: [facility] that prompted transfer to Nurse Triage: Patient is having painful bladder spasms, been treated several times for UTI medication is not working, and now having problems with emptying the bladder that is causing a lot of pain.   Chief Complaint: Recurrent UTI symptoms Symptoms: burning with urination, bladder spasms, frequency Frequency: Originally seen 06/05/2023  Pertinent Negatives: Patient denies flank pain, obvious blood in urine,  Disposition: [] ED /[] Urgent Care (no appt availability in office) / [x] Appointment(In office/virtual)/ []  [facility]/ [] Home Care/ [] Refused Recommended Disposition /[]  Mobile Bus/ []  Follow-up with PCP Additional Notes: Patient called and advised that she is still having urinary symptoms. She has had two antibiotics.  She has a history of UTIs. Patient was seen 06/05/2023 by Christen Butter, NP. Patient states that she is out of Pyridium and would like a refill of that if possible. Patient states she completed the Macrobid as directed.  She thought she felt a little better but then the next day symptoms returned. Patient also has had kidney stones in the past.  She states that this does not feel like a kidney stone but she is concerned that something more may be going on since two antibiotics haven't cleared up her symptoms, such as maybe a bladder stone. She denies any obvious blood in her urine or flank pain. She states that her pain with urination is 8 out of 10. Appointment made for today 06/22/2023 at 11:30 am with patient's PCP: Dr Linford Arnold. Patient is advised that if anything gets worse to go to the Emergency Room. Patient verbalized understanding.    Reason for Disposition  [1] SEVERE pain with urination (e.g., excruciating) AND [2] not improved after 2 hours of pain medicine and Sitz bath  Answer Assessment - Initial Assessment  Questions 1. SEVERITY: "How bad is the pain?"  (e.g., Scale 1-10; mild, moderate, or severe)   - MILD (1-3): complains slightly about urination hurting   - MODERATE (4-7): interferes with normal activities     - SEVERE (8-10): excruciating, unwilling or unable to urinate because of the pain      8 only when urinating 2. FREQUENCY: "How many times have you had painful urination today?"      N/a 3. PATTERN: "Is pain present every time you urinate or just sometimes?"      Present unless patient takes Pyridium 4. ONSET: "When did the painful urination start?"      Pt seen in office 3/28--finished two different antibiotics--thought  5. FEVER: "Do you have a fever?" If Yes, ask: "What is your temperature, how was it measured, and when did it start?"     No 6. PAST UTI: "Have you had a urine infection before?" If Yes, ask: "When was the last time?" and "What happened that time?"      Yes--antibiotics 7. CAUSE: "What do you think is causing the painful urination?"  (e.g., UTI, scratch, Herpes sore)     Patient wondering if a kidney stone or recurrent UTI 8. OTHER SYMPTOMS: "Do you have any other symptoms?" (e.g., blood in urine, flank pain, genital sores, urgency, vaginal discharge)     Urinary frequency, Bladder spasms  Protocols used: Urination Pain - Female-A-AH

## 2023-06-22 NOTE — Telephone Encounter (Signed)
Patient scheduled today with Dr Linford Arnold

## 2023-06-23 ENCOUNTER — Encounter: Payer: Self-pay | Admitting: Family Medicine

## 2023-06-23 NOTE — Progress Notes (Signed)
 HI Martha Perkins  so interestingly your potassium level is actually little too high I know you had mentioned that you had taken a tab or so recently just in case it was causing the cramping so it looks like that is not actually causing it.  You are definitely not low or deficient and I would probably avoid taking extra.  The ALT liver enzyme is up just slightly not like it was a year ago but still just slightly and the AST liver enzyme is still normal.  Magnesium and metabolic panel are still pending.

## 2023-06-23 NOTE — Progress Notes (Signed)
 Magnesium is normal.  I know you said you take a supplement so you are okay to continue that.

## 2023-06-24 LAB — CMP14+EGFR
ALT: 33 IU/L — ABNORMAL HIGH (ref 0–32)
AST: 27 IU/L (ref 0–40)
Albumin: 4.2 g/dL (ref 3.8–4.8)
Alkaline Phosphatase: 71 IU/L (ref 44–121)
BUN/Creatinine Ratio: 21 (ref 12–28)
BUN: 16 mg/dL (ref 8–27)
Bilirubin Total: 0.6 mg/dL (ref 0.0–1.2)
CO2: 17 mmol/L — ABNORMAL LOW (ref 20–29)
Calcium: 10 mg/dL (ref 8.7–10.3)
Chloride: 104 mmol/L (ref 96–106)
Creatinine, Ser: 0.75 mg/dL (ref 0.57–1.00)
Globulin, Total: 2.9 g/dL (ref 1.5–4.5)
Glucose: 86 mg/dL (ref 70–99)
Potassium: 5.4 mmol/L — ABNORMAL HIGH (ref 3.5–5.2)
Sodium: 139 mmol/L (ref 134–144)
Total Protein: 7.1 g/dL (ref 6.0–8.5)
eGFR: 83 mL/min/{1.73_m2} (ref >59)

## 2023-06-24 LAB — URINE CULTURE

## 2023-06-24 LAB — MAGNESIUM: Magnesium: 1.9 mg/dL (ref 1.6–2.3)

## 2023-06-24 NOTE — Progress Notes (Signed)
 Hi Delani your urine culture came back negative. No UTI

## 2023-06-25 NOTE — Telephone Encounter (Signed)
 Positive nitrites can indicate a UTI but it is not definitive that is why we sometimes do the culture.  It can be positive because of certain medications or certain things that you might eat or other underlying medical conditions.  Even the Pyridium that we sometimes recommend to help numb the bladder and make things more comfortable can cause positive nitrates.  If she still having a lot of symptoms we can always recollect another specimen.

## 2023-06-25 NOTE — Telephone Encounter (Signed)
 Patient informed as written by Dr. Greer Leak. She states she completed the abx last night  And is still using the  pyridium She states symptoms are better this morning . She will reach out to us  if this changes and she needs anything further.

## 2023-06-29 ENCOUNTER — Encounter: Payer: Self-pay | Admitting: Family Medicine

## 2023-06-29 DIAGNOSIS — R252 Cramp and spasm: Secondary | ICD-10-CM

## 2023-06-29 NOTE — Telephone Encounter (Signed)
 Pended referral

## 2023-06-29 NOTE — Addendum Note (Signed)
 Addended by: Dickie Found on: 06/29/2023 05:02 PM   Modules accepted: Orders

## 2023-06-29 NOTE — Addendum Note (Signed)
 Addended by: Graycie Halley D on: 06/29/2023 05:14 PM   Modules accepted: Orders

## 2023-07-13 ENCOUNTER — Other Ambulatory Visit: Payer: Self-pay | Admitting: Family Medicine

## 2023-07-13 DIAGNOSIS — I10 Essential (primary) hypertension: Secondary | ICD-10-CM

## 2023-07-13 MED ORDER — VALSARTAN 160 MG PO TABS: 160.0000 mg | ORAL_TABLET | Freq: Every day | ORAL | 2 refills | Status: DC

## 2023-07-13 NOTE — Telephone Encounter (Signed)
 Copied from CRM [id number]. Topic: Clinical - Medication Refill >> Jul 13, 2023 10:00 AM Corin V wrote: Most Recent Primary Care Visit:  Provider: METHENEY, CATHERINE D  Department: [facility]  Visit Type: ACUTE  Date: 06/22/2023  Medication: valsartan  (DIOVAN ) 160 MG tablet  Has the patient contacted their pharmacy? Yes (Agent: If no, request that the patient contact the pharmacy for the refill. If patient does not wish to contact the pharmacy document the reason why and proceed with request.) (Agent: If yes, when and what did the pharmacy advise?)  Is this the correct pharmacy for this prescription? Yes If no, delete pharmacy and type the correct one.  This is the patient's preferred pharmacy:  [facility] [place], [place] - [address] [place] [address] Phone: [phone] Fax: [phone]   Has the prescription been filled recently? No  Is the patient out of the medication? Yes  Has the patient been seen for an appointment in the last year OR does the patient have an upcoming appointment? Yes  Can we respond through MyChart? Yes  Agent: Please be advised that Rx refills may take up to 3 business days. We ask that you follow-up with your pharmacy.

## 2023-08-05 ENCOUNTER — Other Ambulatory Visit: Payer: Self-pay | Admitting: Family Medicine

## 2023-08-05 DIAGNOSIS — I10 Essential (primary) hypertension: Secondary | ICD-10-CM

## 2023-08-14 ENCOUNTER — Telehealth: Payer: Self-pay | Admitting: Family Medicine

## 2023-08-14 ENCOUNTER — Ambulatory Visit (INDEPENDENT_AMBULATORY_CARE_PROVIDER_SITE_OTHER): Admitting: Physician Assistant

## 2023-08-14 ENCOUNTER — Ambulatory Visit: Payer: Self-pay

## 2023-08-14 ENCOUNTER — Telehealth (HOSPITAL_BASED_OUTPATIENT_CLINIC_OR_DEPARTMENT_OTHER): Payer: Self-pay | Admitting: Family Medicine

## 2023-08-14 DIAGNOSIS — N3001 Acute cystitis with hematuria: Secondary | ICD-10-CM | POA: Diagnosis not present

## 2023-08-14 DIAGNOSIS — R300 Dysuria: Secondary | ICD-10-CM

## 2023-08-14 LAB — POCT URINALYSIS DIP (CLINITEK)
Bilirubin, UA: NEGATIVE
Glucose, UA: NEGATIVE mg/dL
Ketones, POC UA: NEGATIVE mg/dL
Nitrite, UA: NEGATIVE
POC PROTEIN,UA: NEGATIVE
Spec Grav, UA: 1.01 (ref 1.010–1.025)
Urobilinogen, UA: 0.2 U/dL
pH, UA: 6 (ref 5.0–8.0)

## 2023-08-14 MED ORDER — CIPROFLOXACIN HCL 250 MG PO TABS: 250.0000 mg | ORAL_TABLET | Freq: Two times a day (BID) | ORAL | 0 refills | Status: AC

## 2023-08-14 MED ORDER — NITROFURANTOIN MONOHYD MACRO 100 MG PO CAPS
100.0000 mg | ORAL_CAPSULE | Freq: Two times a day (BID) | ORAL | 0 refills | Status: DC
Start: 2023-08-14 — End: 2023-08-14

## 2023-08-14 NOTE — Telephone Encounter (Signed)
 Spoke w/Jade and she ok'd nurse visit for patient to be seen for UTI.   Called pt and advised to come in @ 230pm.

## 2023-08-14 NOTE — Progress Notes (Signed)
   Acute Office Visit  Subjective:     Patient ID: Martha Perkins, female    DOB: Oct 26, 1945, 78 y.o.   MRN: [id number]  Chief Complaint  Patient presents with   Urinary Tract Infection    Dysuria, frequent urgency. Started this morning    HPI Patient is in today for UTI symptoms. She denies any fever, chills, flank pain, abdominal pain. She started having dysuria this morning. Not taken anything for it.   .. Results for orders placed or performed in visit on 08/14/23  POCT URINALYSIS DIP (CLINITEK)   Collection Time: 08/14/23  3:09 PM  Result Value Ref Range   Color, UA yellow yellow   Clarity, UA cloudy (A) clear   Glucose, UA negative negative mg/dL   Bilirubin, UA negative negative   Ketones, POC UA negative negative mg/dL   Spec Grav, UA 4.696 2.952 - 1.025   Blood, UA moderate (A) negative   pH, UA 6.0 5.0 - 8.0   POC PROTEIN,UA negative negative, trace   Urobilinogen, UA 0.2 0.2 or 1.0 E.U./dL   Nitrite, UA Negative Negative   Leukocytes, UA Large (3+) (A) Negative       Assessment & Plan:  Aaron AasAaron AasBetsabe was seen today for urinary tract infection.  Diagnoses and all orders for this visit:  Acute cystitis with hematuria -     POCT URINALYSIS DIP (CLINITEK) -     nitrofurantoin , macrocrystal-monohydrate, (MACROBID ) 100 MG capsule; Take 1 capsule (100 mg total) by mouth 2 (two) times daily. -     Urine Culture  Dysuria -     POCT URINALYSIS DIP (CLINITEK) -     nitrofurantoin , macrocrystal-monohydrate, (MACROBID ) 100 MG capsule; Take 1 capsule (100 mg total) by mouth 2 (two) times daily. -     Urine Culture   UA POCT is positive for large blood and leukocytes Will culture No red flag symptoms today Start macrobid  based off last culture in march with infection Symptomatic care discussed with increasing water  Follow up if not improving or symptoms worsening  Sandy Crumb, PA-C

## 2023-08-14 NOTE — Telephone Encounter (Signed)
 On call provider called to advise of the below and he says the preferred pharmacy should be able to transfer the Rx. I called [employer] and spoke to [place], [facility] who says they just transferred it over.    Copied from CRM [id number]. Topic: Clinical - Prescription Issue >> Aug 14, 2023  5:14 PM Adrianna P wrote: Reason for [id number] prescription for nitrofurantoin , macrocrystal-monohydrate, (MACROBID ) 100 MG capsule was sent to the incorrect address, please send to [facility] [place], [place] - [address]

## 2023-08-14 NOTE — Telephone Encounter (Signed)
 Attempted to call patient at 9:02, no answer. Attempted to call patient at 9:05, no answer.  Spoke with patient on third attempted call.  She reports that she has had issues with Macrobid  in the past, reporting that 2 days after starting medication, she developed severe cramping all over her body which would occur sporadically.  These cramps stopped about 2 days after completing treatment with Macrobid  previously.  She reports informing provider about this, however Macrobid  was ultimately sent into the pharmacy and she is thus requesting alternative antibiotic while awaiting urine culture. 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  Discussed this with patient and that this additionally limited options related to antibiotic therapy.  Based on prior results, can proceed with ciprofloxacin  at this time with plan for primary care office to follow-up on urine culture results once received.  Cautioned patient on potential side effects with ciprofloxacin .  Medication sent to pharmacy on file.

## 2023-08-14 NOTE — Telephone Encounter (Signed)
 FYI Only or Action Required?: Action required by provider  Patient was last seen in primary care on 06/22/2023 by Cydney Draft, MD. Called Nurse Triage reporting urinary symptoms. Symptoms began today. Interventions attempted: Rest, hydration, or home remedies. Symptoms are: gradually worsening.  Triage Disposition: See Physician Within 24 Hours  Patient/caregiver understands and will follow disposition?: No, refuses disposition refused available acute visit today due to "bad interaction" with that provider. Patient would like to be worked in with pcp or lab orders.   Copied from CRM [id number]. Topic: Clinical - Red Word Triage >> Aug 14, 2023 11:53 AM Tisa Forester wrote: Red Word that prompted transfer to Nurse Triage:  had a bladder infection about 6 weeks ago with burning ,pressure and pain rate 7-9 near the uretha  and  diarrhea  having same symptoms again   Patient call back [phone] Reason for Disposition  Age > 50 years  Answer Assessment - Initial Assessment Questions 1. SEVERITY: "How bad is the pain?"  (e.g., Scale 1-10; mild, moderate, or severe)   - MILD (1-3): complains slightly about urination hurting   - MODERATE (4-7): interferes with normal activities     - SEVERE (8-10): excruciating, unwilling or unable to urinate because of the pain      Moderate-burning 2. FREQUENCY: "How many times have you had painful urination today?"      Several today 3. PATTERN: "Is pain present every time you urinate or just sometimes?"      Burning all the time 4. ONSET: "When did the painful urination start?"      today 5. FEVER: "Do you have a fever?" If Yes, ask: "What is your temperature, how was it measured, and when did it start?"     denies 6. PAST UTI: "Have you had a urine infection before?" If Yes, ask: "When was the last time?" and "What happened that time?"      April 7. CAUSE: "What do you think is causing the painful urination?"  (e.g., UTI, scratch, Herpes sore)      UTI 8. OTHER SYMPTOMS: "Do you have any other symptoms?" (e.g., blood in urine, flank pain, genital sores, urgency, vaginal discharge)     Diarrhea Additional info: requests to add to her chart-severe reaction to Macrobid  caused severe all over muscle spasms. Acute evaluation advised, offered only appointment available today but patient states she had a bad interaction with available provider and refuses her but will see other providers.  Protocols used: Urination Pain - Female-A-AH

## 2023-08-14 NOTE — Progress Notes (Signed)
 Pt here today to check for an UTI. She has had dysuria,and frequent urgency that started this morning.                            Performed a UA pt does have an UTI. Will send off to be cultured and Jade breeback, PA_C is going to send in some antibiotics for her.

## 2023-08-15 ENCOUNTER — Encounter: Payer: Self-pay | Admitting: Family Medicine

## 2023-08-15 NOTE — Telephone Encounter (Signed)
 Will add macrobid  to intolerance list.

## 2023-08-18 ENCOUNTER — Ambulatory Visit: Payer: Self-pay | Admitting: Physician Assistant

## 2023-08-18 LAB — URINE CULTURE

## 2023-08-18 NOTE — Progress Notes (Signed)
 E.coli detected and macrobid  the antibiotic given should treat.

## 2023-08-20 ENCOUNTER — Other Ambulatory Visit: Payer: Self-pay | Admitting: Family Medicine

## 2023-08-20 DIAGNOSIS — G8929 Other chronic pain: Secondary | ICD-10-CM

## 2023-08-20 DIAGNOSIS — M51369 Other intervertebral disc degeneration, lumbar region without mention of lumbar back pain or lower extremity pain: Secondary | ICD-10-CM

## 2023-08-20 DIAGNOSIS — I10 Essential (primary) hypertension: Secondary | ICD-10-CM

## 2023-08-21 NOTE — Telephone Encounter (Signed)
 Last OV: 08/14/23 Next OV: no appt scheduled Last RF: 06/22/23

## 2023-09-21 ENCOUNTER — Other Ambulatory Visit: Payer: Self-pay | Admitting: Family Medicine

## 2023-09-28 DIAGNOSIS — R1084 Generalized abdominal pain: Secondary | ICD-10-CM | POA: Diagnosis not present

## 2023-09-28 DIAGNOSIS — K760 Fatty (change of) liver, not elsewhere classified: Secondary | ICD-10-CM | POA: Diagnosis not present

## 2023-09-28 DIAGNOSIS — K5904 Chronic idiopathic constipation: Secondary | ICD-10-CM | POA: Diagnosis not present

## 2023-10-07 ENCOUNTER — Other Ambulatory Visit: Payer: Self-pay | Admitting: Family Medicine

## 2023-10-07 DIAGNOSIS — G8929 Other chronic pain: Secondary | ICD-10-CM

## 2023-10-07 DIAGNOSIS — M51369 Other intervertebral disc degeneration, lumbar region without mention of lumbar back pain or lower extremity pain: Secondary | ICD-10-CM

## 2023-10-09 NOTE — Telephone Encounter (Signed)
 Last OV: 08/14/23 (Acute visit w/Jade) Next OV: no appt scheduled Last RF: 08/21/23

## 2023-10-21 ENCOUNTER — Encounter: Payer: Self-pay | Admitting: Family Medicine

## 2023-10-21 DIAGNOSIS — L659 Nonscarring hair loss, unspecified: Secondary | ICD-10-CM

## 2023-10-21 MED ORDER — MINOXIDIL 2.5 MG PO TABS: 2.5000 mg | ORAL_TABLET | Freq: Every day | ORAL | 1 refills | Status: DC

## 2023-10-22 ENCOUNTER — Other Ambulatory Visit: Payer: Self-pay | Admitting: Family Medicine

## 2023-10-22 DIAGNOSIS — M7989 Other specified soft tissue disorders: Secondary | ICD-10-CM

## 2023-11-06 ENCOUNTER — Encounter: Payer: Self-pay | Admitting: Urgent Care

## 2023-11-06 ENCOUNTER — Ambulatory Visit: Admitting: Urgent Care

## 2023-11-06 VITALS — BP 120/79 | HR 82 | Resp 17 | Ht 62.0 in | Wt 219.0 lb

## 2023-11-06 DIAGNOSIS — N3000 Acute cystitis without hematuria: Secondary | ICD-10-CM | POA: Diagnosis not present

## 2023-11-06 DIAGNOSIS — R300 Dysuria: Secondary | ICD-10-CM | POA: Diagnosis not present

## 2023-11-06 LAB — POCT URINALYSIS DIP (CLINITEK)
Bilirubin, UA: NEGATIVE
Glucose, UA: NEGATIVE mg/dL
Ketones, POC UA: NEGATIVE mg/dL
Nitrite, UA: NEGATIVE
POC PROTEIN,UA: NEGATIVE
Spec Grav, UA: 1.01 (ref 1.010–1.025)
Urobilinogen, UA: 0.2 U/dL
pH, UA: 5.5 (ref 5.0–8.0)

## 2023-11-06 MED ORDER — CIPROFLOXACIN HCL 500 MG PO TABS: 500.0000 mg | ORAL_TABLET | Freq: Two times a day (BID) | ORAL | 0 refills | Status: AC

## 2023-11-06 NOTE — Patient Instructions (Signed)
\  Your symptoms are consistent with a urinary tract infection. Start taking the antibiotic twice daily until completed.  We will send out your urine culture and only call if a change in treatment is necessary.  Drink plenty of water  on the antibiotic and avoid excessive sun exposure. Review BBW - if you develop tendon pain, vision change or tingling, stop the antibiotic and contact our office.   Monitor for any change in or worsening symptoms; fever, hematuria or flank pain would warrant a recheck

## 2023-11-06 NOTE — Progress Notes (Signed)
 Established Patient Office Visit  Subjective:  Patient ID: Martha Perkins, female    DOB: 09-03-1945  Age: 78 y.o. MRN: [id number]  Chief Complaint  Patient presents with   Urinary Tract Infection    78 year old female presents to clinic for UTI symptoms. She c/o dysuria, suprapubic pressure, left sided flank pain, and urinary urgency since Weds. She reports flank pain resolved today. Pt states she has a hx of UTI and kidney stones. Treating pain with tylenol  and tramadol . Denies fever or hematuria.   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   Aortic atherosclerosis (HCC), LDL goal < 70 12/26/2019   Palpitations 10/21/2019   Morbid obesity (HCC) 10/21/2019   Encounter for chronic pain management 06/30/2019   Hypothyroidism 09/11/2016   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/14/2016   Restrictive lung disease 09/28/2015   Lumbar degenerative disc disease 08/15/2015   PAF (paroxysmal atrial fibrillation) (HCC) 08/09/2015   Chronic allergic rhinitis 05/17/2014   Cataract 06/03/2012   Hyperlipidemia LDL goal <100 12/21/2010   Essential hypertension, goal Less than 130/85 12/21/2010   GERD (gastroesophageal reflux disease) 07/18/2010   Insulin resistance 07/18/2010   Fatty liver 07/18/2010   Past Medical History:  Diagnosis Date   Arthritis    neck, back, hips, knees, ankles   Asthma    use everyday   Atypical mole 12/03/2017   Left Lower Shin (mild)   Atypical mole 05/21/2018   Left Deltoid (mild)   Atypical mole 05/21/2018   Left Tricep (moderate to severe)   Cancer (HCC) 2019    skin   Dyspnea    Dysrhythmia    PVCs , Afib   Heart murmur    History of kidney stones    Hyperlipidemia    Hypertension    Hyperthyroidism 08/08/2017   radio active iodine x2   Hypothyroidism    now take synthroid    Lymphedema 12/03/2021   Paroxysmal atrial fibrillation (HCC) 08/09/2015   PONV (postoperative nausea and vomiting)    Superficial basal cell carcinoma (BCC) 12/03/2017   Right Outer Abdomen Mid (curet and 5FU)   Past Surgical History:  Procedure Laterality Date   CARDIOVERSION N/A 08/05/2016   Procedure: CARDIOVERSION;  Surgeon: Raford Riggs, MD;  Location: [facility] ENDOSCOPY;  Service: Cardiovascular;  Laterality: N/A;   CATARACT EXTRACTION Left 09/2012   Dr. Charle Burke   LITHOTRIPSY     REPLACEMENT TOTAL KNEE BILATERAL  2011   REVERSE SHOULDER ARTHROPLASTY Left 07/19/2020   Procedure: REVERSE SHOULDER ARTHROPLASTY;  Surgeon: Melita Drivers, MD;  Location: [facility] ORS;  Service: Orthopedics;  Laterality: Left;    REVERSE SHOULDER ARTHROPLASTY Right 12/06/2020   Procedure: REVERSE SHOULDER ARTHROPLASTY;  Surgeon: Melita Drivers, MD;  Location: [facility] ORS;  Service: Orthopedics;  Laterality: Right;    THYROIDECTOMY  1971   TUBAL LIGATION  1979   Social History   Tobacco Use   Smoking status: Never   Smokeless tobacco: Never  Vaping Use   Vaping status: Never Used  Substance Use Topics   Alcohol use: No   Drug use: No      ROS: as noted  in HPI  Objective:     BP 120/79   Pulse 82   Resp 17   Ht 5' 2 (1.575 m)   Wt 219 lb (99.3 kg)   SpO2 98%   BMI 40.06 kg/m  BP Readings from Last 3 Encounters:  11/06/23 120/79  06/22/23 (!) 132/59  06/05/23 132/62   Wt Readings from Last 3 Encounters:  11/06/23 219 lb (99.3 kg)  06/22/23 217 lb (98.4 kg)  06/05/23 214 lb (97.1 kg)      Physical Exam Vitals and nursing note reviewed.  Constitutional:      General: She is not in acute distress.    Appearance: Normal appearance. She is not  ill-appearing, toxic-appearing or diaphoretic.  HENT:     Head: Normocephalic and atraumatic.  Eyes:     General:        Right eye: No discharge.        Left eye: No discharge.     Extraocular Movements: Extraocular movements intact.     Pupils: Pupils are equal, round, and reactive to light.  Cardiovascular:     Rate and Rhythm: Normal rate.  Pulmonary:     Effort: Pulmonary effort is normal. No respiratory distress.  Abdominal:     General: Abdomen is flat. There is no distension.     Palpations: Abdomen is soft.     Tenderness: There is no abdominal tenderness. There is no right CVA tenderness, left CVA tenderness, guarding or rebound.  Skin:    General: Skin is warm and dry.     Coloration: Skin is not jaundiced.     Findings: No bruising, erythema or rash.  Neurological:     Mental Status: She is alert and oriented to person, place, and time.      Results for orders placed or performed in visit on 11/06/23  POCT URINALYSIS DIP (CLINITEK)  Result Value Ref Range   Color, UA yellow yellow   Clarity, UA cloudy (A) clear   Glucose, UA negative negative mg/dL   Bilirubin, UA negative negative   Ketones, POC UA negative negative mg/dL   Spec Grav, UA 8.989 8.989 - 1.025   Blood, UA moderate (A) negative   pH, UA 5.5 5.0 - 8.0   POC PROTEIN,UA negative negative, trace   Urobilinogen, UA 0.2 0.2 or 1.0 E.U./dL   Nitrite, UA Negative Negative   Leukocytes, UA Moderate (2+) (A) Negative    Last CBC Lab Results  Component Value Date   WBC 6.1 08/01/2022   HGB 14.0 08/01/2022   HCT 41.0 08/01/2022   MCV 90.7 08/01/2022   MCH 31.0 08/01/2022   RDW 12.5 08/01/2022   PLT 221 08/01/2022   Last metabolic panel Lab Results  Component Value Date   GLUCOSE 86 06/22/2023   NA 139 06/22/2023   K 5.4 (H) 06/22/2023   CL 104 06/22/2023   CO2 17 (L) 06/22/2023   BUN 16 06/22/2023   CREATININE 0.75 06/22/2023   EGFR 83 06/22/2023   CALCIUM  10.0 06/22/2023   PROT 7.1  06/22/2023   ALBUMIN 4.2 06/22/2023   LABGLOB 2.9 06/22/2023   BILITOT 0.6 06/22/2023   ALKPHOS 71 06/22/2023   AST 27 06/22/2023   ALT 33 (H) 06/22/2023   ANIONGAP 7 12/06/2020      The 10-year ASCVD risk score (Arnett DK, et al., 2019) is: 21%  Assessment & Plan:  Dysuria -     POCT URINALYSIS DIP (CLINITEK) -     Urine Culture -  Ciprofloxacin  HCl; Take 1 tablet (500 mg total) by mouth 2 (two) times daily for 5 days.  Dispense: 10 tablet; Refill: 0  Acute cystitis without hematuria -     POCT URINALYSIS DIP (CLINITEK) -     Urine Culture -     Ciprofloxacin  HCl; Take 1 tablet (500 mg total) by mouth 2 (two) times daily for 5 days.  Dispense: 10 tablet; Refill: 0  Sx consistent with UTI. Last urine cx shows multiple resistant antibiotics. Pt allergic to macrobid . Pt has been on cipro  in the past without ADRs. Discussed BBW with pt. Take cipro  BiD x 5 days, increase water  intake.   No follow-ups on file.   Benton LITTIE Gave, PA

## 2023-11-10 ENCOUNTER — Ambulatory Visit: Payer: Self-pay | Admitting: Urgent Care

## 2023-11-10 ENCOUNTER — Other Ambulatory Visit: Payer: Self-pay | Admitting: Family Medicine

## 2023-11-10 DIAGNOSIS — I10 Essential (primary) hypertension: Secondary | ICD-10-CM

## 2023-11-10 LAB — URINE CULTURE

## 2023-11-13 ENCOUNTER — Other Ambulatory Visit: Payer: Self-pay | Admitting: Family Medicine

## 2023-11-13 DIAGNOSIS — I10 Essential (primary) hypertension: Secondary | ICD-10-CM

## 2023-11-13 MED ORDER — SPIRONOLACTONE 25 MG PO TABS: 25.0000 mg | ORAL_TABLET | Freq: Every day | ORAL | 0 refills | Status: DC

## 2023-11-13 NOTE — Telephone Encounter (Signed)
 Patient calling office requesting medication refill on the following medication  Spironolactone  25 mg Oral Daily

## 2023-11-13 NOTE — Telephone Encounter (Signed)
 Copied from CRM #[id number]. Topic: Clinical - Medication Refill >> Nov 13, 2023  2:08 PM Brittney F wrote: Patient is calling in to refill her prescription after her pharmacy has not heard back from the office. The specialist did inform the patient that a message was sent over to her provider on the 11/10/23 but there has been no update. Patient has relayed that now she is completely out of the prescription.  Please call patient back to assist and provide clarification.  Callback Number: [id number]   Medication: spironolactone  (ALDACTONE ) 25 MG tablet   Has the patient contacted their pharmacy? Yes   This is the patient's preferred pharmacy:     [facility] [place], [place] - [address] [place] [address] Phone: [phone] Fax: [phone]    Is this the correct pharmacy for this prescription? Yes   Has the prescription been filled recently? No  Is the patient out of the medication? Yes  Has the patient been seen for an appointment in the last year OR does the patient have an upcoming appointment? Yes  Can we respond through MyChart? Yes  Agent: Please be advised that Rx refills may take up to 3 business days. We ask that you follow-up with your pharmacy.

## 2023-11-19 ENCOUNTER — Other Ambulatory Visit: Payer: Self-pay | Admitting: Family Medicine

## 2023-11-19 DIAGNOSIS — G8929 Other chronic pain: Secondary | ICD-10-CM

## 2023-11-19 DIAGNOSIS — M51369 Other intervertebral disc degeneration, lumbar region without mention of lumbar back pain or lower extremity pain: Secondary | ICD-10-CM

## 2023-12-01 ENCOUNTER — Encounter: Payer: Self-pay | Admitting: Family Medicine

## 2023-12-04 NOTE — Telephone Encounter (Signed)
 LM for pt to return call. Also sent message to MyChart with Dr. Claudia reply.

## 2023-12-04 NOTE — Telephone Encounter (Signed)
 She needs urgent visit with her eye doctor.  Does she have an established eye doctor?  If not and she still having visual changes then she can go to the emergency room sometimes they have an eye doctor on-call.  We could also call one of our local eye docs here and see if anybody is open for urgent visits or near [place] which is closer to where she lives.

## 2023-12-08 DIAGNOSIS — G43B0 Ophthalmoplegic migraine, not intractable: Secondary | ICD-10-CM | POA: Diagnosis not present

## 2023-12-15 ENCOUNTER — Other Ambulatory Visit: Payer: Self-pay | Admitting: Family Medicine

## 2023-12-15 DIAGNOSIS — Z1231 Encounter for screening mammogram for malignant neoplasm of breast: Secondary | ICD-10-CM

## 2023-12-16 ENCOUNTER — Ambulatory Visit: Payer: Self-pay

## 2023-12-16 ENCOUNTER — Ambulatory Visit (INDEPENDENT_AMBULATORY_CARE_PROVIDER_SITE_OTHER): Admitting: Physician Assistant

## 2023-12-16 ENCOUNTER — Encounter: Payer: Self-pay | Admitting: Physician Assistant

## 2023-12-16 VITALS — BP 143/75 | HR 88 | Ht 62.0 in | Wt 223.0 lb

## 2023-12-16 DIAGNOSIS — J984 Other disorders of lung: Secondary | ICD-10-CM

## 2023-12-16 DIAGNOSIS — I10 Essential (primary) hypertension: Secondary | ICD-10-CM

## 2023-12-16 DIAGNOSIS — E890 Postprocedural hypothyroidism: Secondary | ICD-10-CM | POA: Diagnosis not present

## 2023-12-16 DIAGNOSIS — B353 Tinea pedis: Secondary | ICD-10-CM

## 2023-12-16 DIAGNOSIS — I890 Lymphedema, not elsewhere classified: Secondary | ICD-10-CM

## 2023-12-16 MED ORDER — LEVOTHYROXINE SODIUM 125 MCG PO TABS: 125.0000 ug | ORAL_TABLET | Freq: Every day | ORAL | 2 refills | Status: DC

## 2023-12-16 MED ORDER — FLUCONAZOLE 150 MG PO TABS: ORAL_TABLET | ORAL | 0 refills | Status: DC

## 2023-12-16 MED ORDER — METOPROLOL SUCCINATE ER 50 MG PO TB24: 50.0000 mg | ORAL_TABLET | Freq: Every day | ORAL | 2 refills | Status: DC

## 2023-12-16 MED ORDER — ARNUITY ELLIPTA 100 MCG/ACT IN AEPB: 1.0000 | INHALATION_SPRAY | Freq: Every day | RESPIRATORY_TRACT | 5 refills | Status: AC

## 2023-12-16 MED ORDER — AIRSUPRA 90-80 MCG/ACT IN AERO: 2.0000 | INHALATION_SPRAY | Freq: Four times a day (QID) | RESPIRATORY_TRACT | 1 refills | Status: DC | PRN

## 2023-12-16 NOTE — Patient Instructions (Signed)
 [facility].

## 2023-12-16 NOTE — Progress Notes (Signed)
 Acute Office Visit  Subjective:     Patient ID: Martha Perkins, female    DOB: 05-12-1945, 78 y.o.   MRN: [id number]  Chief Complaint  Patient presents with   Medical Management of Chronic Issues    Leg swelling    HPI Discussed the use of AI scribe software for clinical note transcription with the patient, who gave verbal consent to proceed.  History of Present Illness Martha Perkins is a 78 year old female with lymphedema who presents with worsening swelling and skin changes in her lower extremities.  Lower extremity edema - Long-standing lymphedema primarily affecting lower extremities, with more pronounced swelling in one leg - Worsening swelling in lower extremities, more severe this morning despite sleeping in a recliner with feet elevated - Recent increase in weight - Occasional use of compression garments, but difficulty applying them due to arthritis - Discontinued lymphedema clinic due to dissatisfaction with therapist and travel distance - Diuretic therapy with Lasix , not taken consistently as it does not significantly alleviate swelling; last dose taken approximately five days ago  Cutaneous changes of the lower extremities - New area on foot began as a bug bite a few days ago, now more inflamed and pruritic, worsened after wearing different shoes yesterday - Scaling present on the side of the foot  Functional limitations - Difficulty with personal foot care due to limited mobility - Difficulty applying compression garments due to arthritis  Sleep disturbance - Typically sleeps only two to three hours per night  Cardiopulmonary symptoms - No shortness of breath - she does request her arnuity and rescue inhaler today    ROS See HPI.      Objective:    BP (!) 143/75   Pulse 88   Ht 5' 2 (1.575 m)   Wt 223 lb (101.2 kg)   SpO2 99%   BMI 40.79 kg/m  BP Readings from Last 3 Encounters:  12/16/23 (!) 143/75  11/06/23 120/79  06/22/23 (!) 132/59    Wt Readings from Last 3 Encounters:  12/16/23 223 lb (101.2 kg)  11/06/23 219 lb (99.3 kg)  06/22/23 217 lb (98.4 kg)      Physical Exam Constitutional:      Appearance: Normal appearance. She is obese.  HENT:     Head: Normocephalic.  Cardiovascular:     Rate and Rhythm: Normal rate and regular rhythm.  Pulmonary:     Effort: Pulmonary effort is normal.     Breath sounds: Normal breath sounds.  Musculoskeletal:     Right lower leg: Edema present.     Left lower leg: Edema present.     Comments: 2+ pitting edema bilateral feet and legs past calves.  Right anterior leg weeping.  Left anterior leg erythematous and slightly warm.  Bilateral harding of skin of lower bilateral legs  Skin:    Comments: Scaly appearance of skin in between toes and along sides of feet.   Neurological:     General: No focal deficit present.     Mental Status: She is alert and oriented to person, place, and time.  Psychiatric:        Mood and Affect: Mood normal.          Assessment & Plan:  .Equilla was seen today for medical management of chronic issues.  Diagnoses and all orders for this visit:  Lymphedema of both lower extremities -     Ambulatory referral to Physical Therapy  Essential hypertension -  metoprolol  succinate (TOPROL -XL) 50 MG 24 hr tablet; Take 1 tablet (50 mg total) by mouth daily. TAKE 2 TABLETS BY MOUTH DAILY  WITH OR IMMEDIATELY FOLLOWING A  MEAL  Postablative hypothyroidism -     levothyroxine  (SYNTHROID ) 125 MCG tablet; Take 1 tablet (125 mcg total) by mouth daily before breakfast. TAKE 1 TABLET BY MOUTH EVERY  MORNING BEFORE BREAKFAST  Tinea pedis of both feet -     fluconazole (DIFLUCAN) 150 MG tablet; Take once a week for 4 weeks.  Restrictive lung disease -     Albuterol -Budesonide (AIRSUPRA) 90-80 MCG/ACT AERO; Inhale 2 puffs into the lungs every 6 (six) hours as needed. -     Fluticasone  Furoate (ARNUITY ELLIPTA ) 100 MCG/ACT AEPB; Inhale 1 puff into  the lungs daily.    Assessment & Plan Chronic lymphedema and edema of lower extremities Chronic lymphedema with increased swelling and inflammation, likely from a bug bite, without overt infection. Compression difficult due to arthritis. - Apply Unaboots for compression and antibiotic treatment until Monday. - Investigate local lymphedema clinic options for potential referral. - Refill Lasix  for short-term fluid retention management.  Tinea pedis Scaling and itching on the foot suggestive of tinea pedis, possibly exacerbated by new shoes. No warmth or overt infection. - Prescribe Diflucan once weekly for four weeks.  Hypertension Hypertension managed with medication. - not to goal today - recheck in 5 days  Restrictive airway disease - arnuity and airsupra refilled today      Return in about 5 days (around 12/21/2023) for unna boot follow up.  Maggie Senseney, PA-C

## 2023-12-16 NOTE — Telephone Encounter (Signed)
 FYI Only or Action Required?: FYI only for provider.  Patient was last seen in primary care on 11/06/2023 by Lowella Benton CROME, PA.  Called Nurse Triage reporting Leg Swelling.  Symptoms began yesterday.  Interventions attempted: Rest, hydration, or home remedies.  Symptoms are: gradually worsening.  Triage Disposition: See HCP Within 4 Hours (Or PCP Triage)  Patient/caregiver understands and will follow disposition?: Yes Reason for Disposition  SEVERE leg swelling (e.g., swelling extends above knee, entire leg is swollen, weeping fluid)  Answer Assessment - Initial Assessment Questions Bilateral pitting leg edema and redness. Pt states she has lymphedema, this is worse than her baseline. Are on right shin appears similar to a blister/sore. Also reports insect bite on left foot that is very red since yesterday. 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 Denies dyspnea. ED precautions reviewed, pt verbalized understanding.   1. ONSET: When did the swelling start? (e.g., minutes, hours, days)     Left foot yesterday, bilateral edema started this morning.  2. LOCATION: What part of the leg is swollen?  Are both legs swollen or just one leg?     See above  3. REDNESS: Is there redness or signs of infection?     Yes  4. PAIN: Is the swelling painful to touch? If Yes, ask: How painful is it?   (Scale 1-10; mild, moderate or severe)     Described as pressure  5. FEVER: Do you have a fever? If Yes, ask: What is it, how was it measured, and when did it start?      Denies  6. CAUSE: What do you think is causing the leg swelling?     Unknown  7. MEDICAL HISTORY: Do you have a history of blood clots (e.g., DVT), cancer, heart failure, kidney disease, or liver failure?     Lymphedema  Protocols used: Leg Swelling and Edema-A-AH Copied from CRM #[id number]. Topic: Clinical - Red Word Triage >> Dec 16, 2023  8:01 AM Montie POUR wrote: Red Word that prompted transfer to  Nurse Triage:  Left foot is really swollen; she had a bite about a week ago and the bite is itchy.  Both legs are swollen worse than yesterday. Very red and not warm to touch

## 2023-12-21 ENCOUNTER — Encounter: Payer: Self-pay | Admitting: Physician Assistant

## 2023-12-21 ENCOUNTER — Ambulatory Visit: Admitting: Physician Assistant

## 2023-12-21 VITALS — BP 122/62 | HR 81 | Ht 62.0 in | Wt 223.0 lb

## 2023-12-21 DIAGNOSIS — I890 Lymphedema, not elsewhere classified: Secondary | ICD-10-CM

## 2023-12-21 DIAGNOSIS — I10 Essential (primary) hypertension: Secondary | ICD-10-CM

## 2023-12-21 NOTE — Patient Instructions (Signed)
Doing GREAT

## 2023-12-21 NOTE — Progress Notes (Signed)
" ° °  Established Patient Office Visit  Subjective   Patient ID: Martha Perkins, female    DOB: 01/20/1946  Age: 78 y.o. MRN: [id number]  HPI Discussed the use of AI scribe software for clinical note transcription with the patient, who gave verbal consent to proceed.  History of Present Illness Martha Perkins is a 78 year old female who presents to the clinic to follow up on unna boot placement for lower extremity edema and lymphedema.   Lower extremity edema - much better but continues to have residual edema and lymphedema. No signs of infection - Concerned about worsening swelling during upcoming flight - Plans to wear compression stockings during travel  Bandage-related skin irritation and drainage - Bandages previously adhered tightly, causing discomfort with removal - Yellow drainage observed on bandages earlier, now improved - History of rash when medicated bandages not removed promptly - Plans to wash legs thoroughly to prevent recurrence of rash    ROS See HPI.    Objective:     BP 122/62 (BP Location: Right Arm)   Pulse 81   Ht 5' 2 (1.575 m)   Wt 223 lb (101.2 kg)   SpO2 99%   BMI 40.79 kg/m  BP Readings from Last 3 Encounters:  12/21/23 122/62  12/16/23 (!) 143/75  11/06/23 120/79   Wt Readings from Last 3 Encounters:  12/21/23 223 lb (101.2 kg)  12/16/23 223 lb (101.2 kg)  11/06/23 219 lb (99.3 kg)      Physical Exam Bilateral lymphedema with fibrosis of lower anterior legs and scant erythema. No warmth, discharge, weeping. Non pitting edema noted around bilateral lateral ankles.   The 10-year ASCVD risk score (Arnett DK, et al., 2019) is: 21.7%    Assessment & Plan:  Martha Perkins was seen today for medical management of chronic issues.  Diagnoses and all orders for this visit:  Lymphedema of both lower extremities  Essential hypertension   Assessment & Plan Lymphedema of both lower extremities Lymphedema improving with less tenderness and  drainage. Swelling may increase with travel. - Advise wearing compression stockings during flights. - Recommend taking diuretics the day before travel and the first few days at the destination. - Instruct to avoid dehydration and excessive sun exposure. - Advise elevating legs when possible, such as at the airport. - Recommend reducing salt intake and maintaining hydration to manage swelling. - Offer to wrap legs again before travel if desired. - Instruct to wash legs thoroughly to prevent rash from medicated bandages. - Pending referral to lymphedema clinic  Essential hypertension Blood pressure well-controlled at 105/70 mmHg.     Return if symptoms worsen or fail to improve.    Martha Basey, PA-C  "

## 2023-12-22 ENCOUNTER — Encounter: Payer: Self-pay | Admitting: Family Medicine

## 2023-12-23 NOTE — Telephone Encounter (Signed)
 Can we find another clinic that can do better?

## 2023-12-24 ENCOUNTER — Encounter: Payer: Self-pay | Admitting: Family Medicine

## 2023-12-24 ENCOUNTER — Other Ambulatory Visit: Payer: Self-pay | Admitting: Family Medicine

## 2023-12-24 DIAGNOSIS — E890 Postprocedural hypothyroidism: Secondary | ICD-10-CM

## 2023-12-24 DIAGNOSIS — I10 Essential (primary) hypertension: Secondary | ICD-10-CM

## 2023-12-24 NOTE — Telephone Encounter (Signed)
 Copied from CRM [id number]. Topic: Clinical - Medication Refill >> Dec 24, 2023 12:39 PM Emylou G wrote: Medication: metoprolol  succinate (TOPROL -XL) 50 MG 24 hr tablet valsartan  (DIOVAN ) 160 MG tablet levothyroxine  (SYNTHROID ) 125 MCG tablet spironolactone  (ALDACTONE ) 25 MG tablet  TRIAGE:  Patient forgot her travel bag says only needs 3 days of medication:    Sent to [facility] [address] [place], [place] [address] [phone]  Has the patient contacted their pharmacy? Yes (Agent: If no, request that the patient contact the pharmacy for the refill. If patient does not wish to contact the pharmacy document the reason why and proceed with request.) (Agent: If yes, when and what did the pharmacy advise?) they are aware  This is the patient's preferred pharmacy:  [facility] [address] [place], [place] [address] [phone]  Is this the correct pharmacy for this prescription? Yes If no, delete pharmacy and type the correct one.   Has the prescription been filled recently? No  Is the patient out of the medication? No  Has the patient been seen for an appointment in the last year OR does the patient have an upcoming appointment? Yes  Can we respond through MyChart? Yes  Agent: Please be advised that Rx refills may take up to 3 business days. We ask that you follow-up with your pharmacy.

## 2023-12-25 ENCOUNTER — Telehealth: Payer: Self-pay

## 2023-12-25 ENCOUNTER — Telehealth: Payer: Self-pay | Admitting: Family Medicine

## 2023-12-25 ENCOUNTER — Other Ambulatory Visit: Payer: Self-pay

## 2023-12-25 DIAGNOSIS — E890 Postprocedural hypothyroidism: Secondary | ICD-10-CM

## 2023-12-25 DIAGNOSIS — I10 Essential (primary) hypertension: Secondary | ICD-10-CM

## 2023-12-25 MED ORDER — METOPROLOL SUCCINATE ER 50 MG PO TB24: 50.0000 mg | ORAL_TABLET | Freq: Every day | ORAL | 0 refills | Status: AC

## 2023-12-25 MED ORDER — SPIRONOLACTONE 25 MG PO TABS: 25.0000 mg | ORAL_TABLET | Freq: Every day | ORAL | 0 refills | Status: DC

## 2023-12-25 MED ORDER — LEVOTHYROXINE SODIUM 125 MCG PO TABS: 125.0000 ug | ORAL_TABLET | Freq: Every day | ORAL | 0 refills | Status: DC

## 2023-12-25 MED ORDER — METOPROLOL SUCCINATE ER 50 MG PO TB24: 50.0000 mg | ORAL_TABLET | Freq: Every day | ORAL | 0 refills | Status: DC

## 2023-12-25 NOTE — Telephone Encounter (Signed)
 This RN attempted to initiate a separate refill request and attach updated pharmacy provided below. This RN could not find selected pharmacy in [facility].   >> Dec 25, 2023 10:27 AM Montie POUR wrote: [facility] does not have a pharmacy.  Please send the above medication to: [facility] [address], [place], [place] [address] Phone: [phone] Thanks  >> Dec 25, 2023 10:09 AM Dedra B wrote: The pharmacy pt previously gave does not have a pharmacy. She would like it sent [facility] on [address] [place], [place]   Copied from CRM [id number]. Topic: Clinical - Medication Refill >> Dec 25, 2023  9:33 AM Charlet HERO wrote: Medication: metoprolol  succinate (TOPROL -XL) 50 MG 24 hr tablet spironolactone  (ALDACTONE ) 25 MG tablet and levothyroxine  (SYNTHROID ) 125 MCG tablet  Has the patient contacted their pharmacy? No She is out of town and just needs enough pills to last till Monday   This is the patient's preferred pharmacy:  [facility]  [address]  [place], [place]  [phone]    Is this the correct pharmacy for this prescription? Yes If no, delete pharmacy and type the correct one.   Has the prescription been filled recently? Yes  Is the patient out of the medication? Yes  Has the patient been seen for an appointment in the last year OR does the patient have an upcoming appointment? Yes  Can we respond through MyChart? Yes  Agent: Please be advised that Rx refills may take up to 3 business days. We ask that you follow-up with your pharmacy.

## 2023-12-25 NOTE — Telephone Encounter (Signed)
 Medication sent.

## 2023-12-25 NOTE — Telephone Encounter (Signed)
Please see additional telephone encounter.

## 2023-12-31 ENCOUNTER — Other Ambulatory Visit: Payer: Self-pay | Admitting: Family Medicine

## 2023-12-31 DIAGNOSIS — N6002 Solitary cyst of left breast: Secondary | ICD-10-CM

## 2024-01-01 ENCOUNTER — Encounter: Payer: Self-pay | Admitting: Family Medicine

## 2024-01-06 ENCOUNTER — Ambulatory Visit

## 2024-01-07 ENCOUNTER — Other Ambulatory Visit: Payer: Self-pay | Admitting: Family Medicine

## 2024-01-07 DIAGNOSIS — M51369 Other intervertebral disc degeneration, lumbar region without mention of lumbar back pain or lower extremity pain: Secondary | ICD-10-CM

## 2024-01-07 DIAGNOSIS — G8929 Other chronic pain: Secondary | ICD-10-CM

## 2024-01-07 NOTE — Telephone Encounter (Unsigned)
 Copied from CRM #[id number]. Topic: Clinical - Medication Refill >> Jan 07, 2024 12:49 PM Winona R wrote: Medication: traMADol  (ULTRAM ) 50 MG tablet [[id number]]  Has the patient contacted their pharmacy? No (Agent: If no, request that the patient contact the pharmacy for the refill. If patient does not wish to contact the pharmacy document the reason why and proceed with request.) (Agent: If yes, when and what did the pharmacy advise?)  This is the patient's preferred pharmacy:  [facility] [place], [place] - [address] [place] [address] Phone: [phone] Fax: [phone]  Is this the correct pharmacy for this prescription? Yes If no, delete pharmacy and type the correct one.   Has the prescription been filled recently? Yes  Is the patient out of the medication? Yes  Has the patient been seen for an appointment in the last year OR does the patient have an upcoming appointment? Yes  Can we respond through MyChart? Yes  Agent: Please be advised that Rx refills may take up to 3 business days. We ask that you follow-up with your pharmacy.

## 2024-01-08 MED ORDER — TRAMADOL HCL 50 MG PO TABS: 50.0000 mg | ORAL_TABLET | Freq: Three times a day (TID) | ORAL | 0 refills | Status: DC | PRN

## 2024-01-11 ENCOUNTER — Telehealth: Payer: Self-pay

## 2024-01-11 NOTE — Telephone Encounter (Signed)
 Copied from CRM [id number]. Topic: General - Call Back - No Documentation >> Jan 08, 2024  4:33 PM Amy B wrote: Reason for CRM: Patient called back stating she agrees to changing her appointment to in person visit for her Medicare Wellness.

## 2024-01-11 NOTE — Telephone Encounter (Signed)
 Changed appt to in person visit  for 01/28/24.

## 2024-01-18 ENCOUNTER — Other Ambulatory Visit: Payer: Self-pay | Admitting: Family Medicine

## 2024-01-18 DIAGNOSIS — E890 Postprocedural hypothyroidism: Secondary | ICD-10-CM

## 2024-01-27 ENCOUNTER — Other Ambulatory Visit: Payer: Self-pay | Admitting: Family Medicine

## 2024-01-27 DIAGNOSIS — L659 Nonscarring hair loss, unspecified: Secondary | ICD-10-CM

## 2024-01-28 ENCOUNTER — Other Ambulatory Visit

## 2024-01-28 ENCOUNTER — Encounter

## 2024-01-28 ENCOUNTER — Ambulatory Visit: Payer: Self-pay

## 2024-01-28 NOTE — Telephone Encounter (Signed)
 Patient scheduled for 01/29/2024 with Joy Jessup,NP

## 2024-01-28 NOTE — Telephone Encounter (Signed)
 FYI Only or Action Required?: FYI only for provider: appointment scheduled on 01/29/24.  Patient was last seen in primary care on 12/21/2023 by Antoniette Vermell CROME, PA-C.  Called Nurse Triage reporting Leg Swelling.  Symptoms began several days ago.  Interventions attempted: Other: elevation.  Symptoms are: gradually worsening.  Triage Disposition: See Physician Within 24 Hours  Patient/caregiver understands and will follow disposition?: Yes          Copied from CRM #[id number]. Topic: Clinical - Red Word Triage >> Jan 28, 2024  9:39 AM Berwyn MATSU wrote: Red Word that prompted transfer to Nurse Triage: swollen red and painful legs been going on for a couple of days. Reason for Disposition  [1] MODERATE leg swelling (e.g., swelling extends up to knees) AND [2] new-onset or getting worse  Answer Assessment - Initial Assessment Questions 1. ONSET: When did the swelling start? (e.g., minutes, hours, days)     Several dates 2. LOCATION: What part of the leg is swollen?  Are both legs swollen or just one leg?     Bilateral - hx of lymphedema, L > R 3. SEVERITY: How bad is the swelling? (e.g., localized; mild, moderate, severe)     Moderate to knee 4. REDNESS: Is there redness or signs of infection?     Yes, with blisters 5. PAIN: Is the swelling painful to touch? If Yes, ask: How painful is it?   (Scale 1-10; mild, moderate or severe)     yes 6. FEVER: Do you have a fever? If Yes, ask: What is it, how was it measured, and when did it start?      denies 7. CAUSE: What do you think is causing the leg swelling?     lymphedema 8. MEDICAL HISTORY: Do you have a history of blood clots (e.g., DVT), cancer, heart failure, kidney disease, or liver failure?     See above 9. RECURRENT SYMPTOM: Have you had leg swelling before? If Yes, ask: When was the last time? What happened that time?     yes 10. OTHER SYMPTOMS: Do you have any other symptoms? (e.g., chest  pain, difficulty breathing)       denies 11. PREGNANCY: Is there any chance you are pregnant? When was your last menstrual period?       N/a  Protocols used: Leg Swelling and Edema-A-AH

## 2024-01-29 ENCOUNTER — Ambulatory Visit: Admitting: Urgent Care

## 2024-01-29 ENCOUNTER — Encounter: Payer: Self-pay | Admitting: Urgent Care

## 2024-01-29 ENCOUNTER — Telehealth: Payer: Self-pay

## 2024-01-29 ENCOUNTER — Ambulatory Visit (INDEPENDENT_AMBULATORY_CARE_PROVIDER_SITE_OTHER): Admitting: Urgent Care

## 2024-01-29 VITALS — BP 120/70 | HR 79 | Ht 62.0 in | Wt 222.0 lb

## 2024-01-29 DIAGNOSIS — E890 Postprocedural hypothyroidism: Secondary | ICD-10-CM

## 2024-01-29 DIAGNOSIS — E875 Hyperkalemia: Secondary | ICD-10-CM | POA: Diagnosis not present

## 2024-01-29 DIAGNOSIS — H6122 Impacted cerumen, left ear: Secondary | ICD-10-CM | POA: Diagnosis not present

## 2024-01-29 DIAGNOSIS — I890 Lymphedema, not elsewhere classified: Secondary | ICD-10-CM | POA: Diagnosis not present

## 2024-01-29 DIAGNOSIS — L03116 Cellulitis of left lower limb: Secondary | ICD-10-CM

## 2024-01-29 DIAGNOSIS — N9089 Other specified noninflammatory disorders of vulva and perineum: Secondary | ICD-10-CM

## 2024-01-29 DIAGNOSIS — I872 Venous insufficiency (chronic) (peripheral): Secondary | ICD-10-CM | POA: Diagnosis not present

## 2024-01-29 DIAGNOSIS — Z23 Encounter for immunization: Secondary | ICD-10-CM

## 2024-01-29 DIAGNOSIS — B353 Tinea pedis: Secondary | ICD-10-CM | POA: Diagnosis not present

## 2024-01-29 DIAGNOSIS — E88819 Insulin resistance, unspecified: Secondary | ICD-10-CM

## 2024-01-29 MED ORDER — CEPHALEXIN 500 MG PO CAPS: 500.0000 mg | ORAL_CAPSULE | Freq: Four times a day (QID) | ORAL | 0 refills | Status: AC

## 2024-01-29 MED ORDER — LOTRIMIN AF 2 % EX AERP: 1.0000 | INHALATION_SPRAY | Freq: Every day | CUTANEOUS | 0 refills | Status: AC

## 2024-01-29 MED ORDER — TRIAMCINOLONE ACETONIDE 0.5 % EX OINT
1.0000 | TOPICAL_OINTMENT | Freq: Two times a day (BID) | CUTANEOUS | 3 refills | Status: AC | PRN
Start: 2024-01-29 — End: ?

## 2024-01-29 NOTE — Patient Instructions (Addendum)
 I have placed two referrals for lymphedema clinic. Call them both and schedule with one of them that you prefer: Lymphedema Physical Therapy [facility] [phone]  [facility]: [facility]  [place], , [place]; [phone]  [facility]   Take cephalexin  four times daily for the next week.  Use topical triamcinolone  to the lower legs AFTER the infection has resolved.  Use lotrimin  spray to your feet to help with fungus.   Please schedule your medicare annual wellness visit.

## 2024-01-29 NOTE — Progress Notes (Unsigned)
 Established Patient Office Visit  Subjective:  Patient ID: Martha Perkins, female    DOB: 28-Sep-1945  Age: 78 y.o. MRN: [id number]  Chief Complaint  Patient presents with  . Leg Swelling    Bilateral with blisters  . Ear Pain    left    HPI  Patient Active Problem List   Diagnosis Date Noted  . Tinea pedis of both feet 12/16/2023  . Lymphedema of both lower extremities 09/10/2022  . Acute lumbar radiculopathy 07/01/2022  . Abnormal US  (ultrasound) of abdomen 01/06/2022  . 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 Abnormal liver enzymes 01/06/2022  . Lymphedema 12/03/2021  . Venous insufficiency of both lower extremities 10/03/2020  . S/P reverse total shoulder arthroplasty, 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  . Aortic atherosclerosis (HCC), LDL goal < 70 12/26/2019  . Palpitations 10/21/2019  . Morbid obesity (HCC) 10/21/2019  . Encounter for chronic pain management 06/30/2019  . Hypothyroidism 09/11/2016  . 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/14/2016  . Restrictive lung disease 09/28/2015  . Lumbar degenerative disc disease 08/15/2015  . PAF (paroxysmal atrial fibrillation) (HCC) 08/09/2015  . Chronic allergic rhinitis 05/17/2014  . Cataract 06/03/2012  . Hyperlipidemia LDL goal <100 12/21/2010  . Essential hypertension, goal Less than 130/85 12/21/2010  . GERD (gastroesophageal reflux disease) 07/18/2010  . Insulin resistance 07/18/2010  . Fatty liver 07/18/2010   Past Medical History:  Diagnosis Date  . Arthritis    neck, back, hips, knees, ankles  . Asthma    use everyday  . Atypical mole 12/03/2017   Left Lower Shin (mild)  . Atypical mole 05/21/2018   Left Deltoid (mild)  . Atypical mole 05/21/2018   Left Tricep (moderate to severe)  . Cancer (HCC) 2019   skin  . Dyspnea   . Dysrhythmia    PVCs , Afib  . Heart murmur   . History of kidney stones   . Hyperlipidemia   . Hypertension   . Hyperthyroidism 08/08/2017   radio active iodine x2  . Hypothyroidism    now  take synthroid   . Lymphedema 12/03/2021  . Paroxysmal atrial fibrillation (HCC) 08/09/2015  . PONV (postoperative nausea and vomiting)   . Superficial basal cell carcinoma (BCC) 12/03/2017   Right Outer Abdomen Mid (curet and 5FU)   Past Surgical History:  Procedure Laterality Date  . CARDIOVERSION N/A 08/05/2016   Procedure: CARDIOVERSION;  Surgeon: Raford Riggs, MD;  Location: [facility] ENDOSCOPY;  Service: Cardiovascular;  Laterality: N/A;  . CATARACT EXTRACTION Left 09/2012   Dr. Charle Burke  . LITHOTRIPSY    . REPLACEMENT TOTAL KNEE BILATERAL  2011  . REVERSE SHOULDER ARTHROPLASTY Left 07/19/2020   Procedure: REVERSE SHOULDER ARTHROPLASTY;  Surgeon: Melita Drivers, MD;  Location: [facility] ORS;  Service: Orthopedics;  Laterality: Left;   . REVERSE SHOULDER ARTHROPLASTY Right 12/06/2020   Procedure: REVERSE SHOULDER ARTHROPLASTY;  Surgeon: Melita Drivers, MD;  Location: [facility];  Service: Orthopedics;  Laterality: Right;   . THYROIDECTOMY  1971  . TUBAL LIGATION  1979   Social History   Tobacco Use  . Smoking status: Never  . Smokeless tobacco: Never  Vaping Use  . Vaping status: Never Used  Substance Use Topics  . Alcohol use: No  . Drug use: No      ROS: as noted in HPI  Objective:     BP 120/70   Pulse 79   Ht 5' 2 (1.575 m)   Wt 222 lb (100.7 kg)   SpO2 97%   BMI  40.60 kg/m  BP Readings from Last 3 Encounters:  01/29/24 120/70  12/21/23 122/62  12/16/23 (!) 143/75   Wt Readings from Last 3 Encounters:  01/29/24 222 lb (100.7 kg)  12/21/23 223 lb (101.2 kg)  12/16/23 223 lb (101.2 kg)      Physical Exam   Ear Cerumen Removal  Date/Time: 01/29/2024 12:00 PM  Performed by: Lowella Benton CROME, PA Authorized by: Lowella Benton CROME, PA   Anesthesia: Local Anesthetic: none Location details: left ear Patient tolerance: patient tolerated the procedure well with no immediate complications Procedure type: curette  Sedation: Patient sedated:  no       No results found for any visits on 01/29/24.  Last CBC Lab Results  Component Value Date   WBC 6.1 08/01/2022   HGB 14.0 08/01/2022   HCT 41.0 08/01/2022   MCV 90.7 08/01/2022   MCH 31.0 08/01/2022   RDW 12.5 08/01/2022   PLT 221 08/01/2022   Last metabolic panel Lab Results  Component Value Date   GLUCOSE 86 06/22/2023   NA 139 06/22/2023   K 5.4 (H) 06/22/2023   CL 104 06/22/2023   CO2 17 (L) 06/22/2023   BUN 16 06/22/2023   CREATININE 0.75 06/22/2023   EGFR 83 06/22/2023   CALCIUM  10.0 06/22/2023   PROT 7.1 06/22/2023   ALBUMIN 4.2 06/22/2023   LABGLOB 2.9 06/22/2023   BILITOT 0.6 06/22/2023   ALKPHOS 71 06/22/2023   AST 27 06/22/2023   ALT 33 (H) 06/22/2023   ANIONGAP 7 12/06/2020   Last lipids Lab Results  Component Value Date   CHOL 281 (H) 02/25/2023   HDL 53 02/25/2023   LDLCALC 195 (H) 02/25/2023   TRIG 174 (H) 02/25/2023   CHOLHDL 5.3 (H) 02/25/2023   Last hemoglobin A1c Lab Results  Component Value Date   HGBA1C 5.6 12/16/2022      The 10-year ASCVD risk score (Arnett DK, et al., 2019) is: 21%  Assessment & Plan:  Postablative hypothyroidism -     TSH + free T4  Lymphedema of both lower extremities -     Ambulatory referral to Physical Therapy -     Ambulatory referral to Physical Therapy  Tinea pedis of both feet -     Lotrimin  AF; Apply 1 application  topically daily.  Dispense: 128 g; Refill: 0  Cellulitis of left lower extremity -     Cephalexin ; Take 1 capsule (500 mg total) by mouth 4 (four) times daily for 7 days.  Dispense: 28 capsule; Refill: 0 -     CBC with Differential/Platelet  Venous stasis dermatitis -     Triamcinolone  Acetonide; Apply 1 Application topically 2 (two) times daily as needed (lower legs).  Dispense: 30 g; Refill: 3  Hyperkalemia -     CMP14+EGFR -     Magnesium   Insulin resistance -     Hemoglobin A1c  Vulvar lesion  Impacted cerumen of left ear -     Ear cerumen removal  Other  orders -     Ear Cerumen Removal     No follow-ups on file.   Benton CROME Lowella, PA

## 2024-01-29 NOTE — Telephone Encounter (Signed)
 Was sent to  [facility] [place], [place] - [address] [place], [place] [address] Phone: [phone]  Today

## 2024-01-29 NOTE — Telephone Encounter (Signed)
 Copied from CRM [id number]. Topic: Clinical - Prescription Issue >> Jan 29, 2024  1:12 PM Anairis L wrote: Reason for CRM: Pharmacy claims they did not receive  Miconazole Nitrate (LOTRIMIN  AF) 2 % AERP script, can you please res-send.  Thank you.

## 2024-01-30 LAB — CBC WITH DIFFERENTIAL/PLATELET
Basophils Absolute: 0.1 x10E3/uL (ref 0.0–0.2)
Basos: 1 %
EOS (ABSOLUTE): 0.2 x10E3/uL (ref 0.0–0.4)
Eos: 3 %
Hematocrit: 41.2 % (ref 34.0–46.6)
Hemoglobin: 13.9 g/dL (ref 11.1–15.9)
Immature Grans (Abs): 0 x10E3/uL (ref 0.0–0.1)
Immature Granulocytes: 0 %
Lymphocytes Absolute: 1.8 x10E3/uL (ref 0.7–3.1)
Lymphs: 29 %
MCH: 32 pg (ref 26.6–33.0)
MCHC: 33.7 g/dL (ref 31.5–35.7)
MCV: 95 fL (ref 79–97)
Monocytes Absolute: 0.6 x10E3/uL (ref 0.1–0.9)
Monocytes: 10 %
Neutrophils Absolute: 3.5 x10E3/uL (ref 1.4–7.0)
Neutrophils: 57 %
Platelets: 228 x10E3/uL (ref 150–450)
RBC: 4.34 x10E6/uL (ref 3.77–5.28)
RDW: 12.4 % (ref 11.7–15.4)
WBC: 6.1 x10E3/uL (ref 3.4–10.8)

## 2024-01-30 LAB — CMP14+EGFR
ALT: 38 IU/L — ABNORMAL HIGH (ref 0–32)
AST: 29 IU/L (ref 0–40)
Albumin: 4.4 g/dL (ref 3.8–4.8)
Alkaline Phosphatase: 70 IU/L (ref 49–135)
BUN/Creatinine Ratio: 22 (ref 12–28)
BUN: 16 mg/dL (ref 8–27)
Bilirubin Total: 0.9 mg/dL (ref 0.0–1.2)
CO2: 23 mmol/L (ref 20–29)
Calcium: 9.9 mg/dL (ref 8.7–10.3)
Chloride: 102 mmol/L (ref 96–106)
Creatinine, Ser: 0.73 mg/dL (ref 0.57–1.00)
Globulin, Total: 2.7 g/dL (ref 1.5–4.5)
Glucose: 90 mg/dL (ref 70–99)
Potassium: 4.6 mmol/L (ref 3.5–5.2)
Sodium: 141 mmol/L (ref 134–144)
Total Protein: 7.1 g/dL (ref 6.0–8.5)
eGFR: 85 mL/min/1.73 (ref >59)

## 2024-01-30 LAB — HEMOGLOBIN A1C
Est. average glucose Bld gHb Est-mCnc: 123 mg/dL
Hgb A1c MFr Bld: 5.9 % — ABNORMAL HIGH (ref 4.8–5.6)

## 2024-01-30 LAB — MAGNESIUM: Magnesium: 1.8 mg/dL (ref 1.6–2.3)

## 2024-01-30 LAB — TSH+FREE T4
Free T4: 1.38 ng/dL (ref 0.82–1.77)
TSH: 1.84 u[IU]/mL (ref 0.450–4.500)

## 2024-01-31 ENCOUNTER — Ambulatory Visit: Payer: Self-pay | Admitting: Urgent Care

## 2024-02-01 NOTE — Telephone Encounter (Signed)
 Spoke with pharmacy - was told that patient had wanted a spray  so this was ordered for the patient and she should be able to pick this up sometime today.

## 2024-02-05 ENCOUNTER — Other Ambulatory Visit: Payer: Self-pay | Admitting: Family Medicine

## 2024-02-05 DIAGNOSIS — I10 Essential (primary) hypertension: Secondary | ICD-10-CM

## 2024-02-09 ENCOUNTER — Ambulatory Visit: Payer: Self-pay

## 2024-02-09 ENCOUNTER — Ambulatory Visit: Payer: Self-pay | Admitting: Family Medicine

## 2024-02-09 ENCOUNTER — Other Ambulatory Visit: Payer: Self-pay | Admitting: Family Medicine

## 2024-02-09 DIAGNOSIS — M51369 Other intervertebral disc degeneration, lumbar region without mention of lumbar back pain or lower extremity pain: Secondary | ICD-10-CM

## 2024-02-09 DIAGNOSIS — I10 Essential (primary) hypertension: Secondary | ICD-10-CM

## 2024-02-09 DIAGNOSIS — G8929 Other chronic pain: Secondary | ICD-10-CM

## 2024-02-09 MED ORDER — VALSARTAN 160 MG PO TABS: 160.0000 mg | ORAL_TABLET | Freq: Every day | ORAL | 2 refills | Status: AC

## 2024-02-09 NOTE — Telephone Encounter (Signed)
 FYI Only or Action Required?: FYI only for provider: appointment scheduled on 02/10/24.  Patient was last seen in primary care on 01/29/2024 by Lowella Benton CROME, PA.  Called Nurse Triage reporting Dysuria.  Symptoms began yesterday.  Interventions attempted: OTC medications: Tylenol  and Prescription medications: Tramadol .  Symptoms are: stable.  Triage Disposition: See Physician Within 24 Hours  Patient/caregiver understands and will follow disposition?: Yes           Copied from CRM #[id number]. Topic: Clinical - Red Word Triage >> Feb 09, 2024 10:43 AM Mercer PEDLAR wrote: Red Word that prompted transfer to Nurse Triage: Patient is having symptoms of UTI, burning with urination. Reason for Disposition  Age > 50 years  Answer Assessment - Initial Assessment Questions 1. SEVERITY: How bad is the pain?  (e.g., Scale 1-10; mild, moderate, or severe)     8-9/10. Improved with Tylenol  and Tramadol .  2. FREQUENCY: How many times have you had painful urination today?      3 times.  3. PATTERN: Is pain present every time you urinate or just sometimes?      Every time.  4. ONSET: When did the painful urination start?      Late last night.  5. FEVER: Do you have a fever? If Yes, ask: What is your temperature, how was it measured, and when did it start?     No.  6. PAST UTI: Have you had a urine infection before? If Yes, ask: When was the last time? and What happened that time?      Yes, seen in  August 2025 for dysuria.  7. CAUSE: What do you think is causing the painful urination?  (e.g., UTI, scratch, Herpes sore)     UTI.  8. OTHER SYMPTOMS: Do you have any other symptoms? (e.g., blood in urine, flank pain, genital sores, urgency, vaginal discharge)     No blood in urine, lower back or flank pain. Urinary urgency and retention (urinating more than a few drops but feels like bladder doesn't fully empty and has to go again).  Patient would like to confirm  with Dr Alvan if she can take her pyridium  or if that will interfere with the urinalysis or urine culture.  Protocols used: Urination Pain - Female-A-AH

## 2024-02-09 NOTE — Telephone Encounter (Signed)
 Attempted to call the patient regarding her concern with taking pyridium . No answer, left a detailed vm message for the patient to refrain from taking the OTC med prior to her appointment tomorrow. Direct call back info provided.

## 2024-02-10 ENCOUNTER — Encounter: Payer: Self-pay | Admitting: Family Medicine

## 2024-02-10 ENCOUNTER — Ambulatory Visit: Admitting: Family Medicine

## 2024-02-10 VITALS — BP 135/75 | HR 77 | Ht 62.0 in | Wt 224.0 lb

## 2024-02-10 DIAGNOSIS — N3000 Acute cystitis without hematuria: Secondary | ICD-10-CM | POA: Diagnosis not present

## 2024-02-10 DIAGNOSIS — R309 Painful micturition, unspecified: Secondary | ICD-10-CM

## 2024-02-10 DIAGNOSIS — R8290 Unspecified abnormal findings in urine: Secondary | ICD-10-CM

## 2024-02-10 LAB — POCT URINALYSIS DIP (CLINITEK)
Bilirubin, UA: NEGATIVE
Glucose, UA: NEGATIVE mg/dL
Ketones, POC UA: NEGATIVE mg/dL
Nitrite, UA: NEGATIVE
POC PROTEIN,UA: NEGATIVE
Spec Grav, UA: 1.015 (ref 1.010–1.025)
Urobilinogen, UA: 1 U/dL
pH, UA: 6 (ref 5.0–8.0)

## 2024-02-10 MED ORDER — CEPHALEXIN 500 MG PO CAPS: 500.0000 mg | ORAL_CAPSULE | Freq: Three times a day (TID) | ORAL | 0 refills | Status: AC

## 2024-02-10 NOTE — Telephone Encounter (Signed)
 Patient seen in office today at 1:30pm

## 2024-02-10 NOTE — Assessment & Plan Note (Signed)
 Treating with course of cephalexin .  Will change if needed based on culture results.  Encouraged increased fluids.  Red flags reviewed.

## 2024-02-10 NOTE — Progress Notes (Signed)
 Martha Perkins - 78 y.o. female MRN [id number]  Date of birth: 1945/05/25  Subjective Chief Complaint  Patient presents with   Urinary Tract Infection    HPI SHYNE RESCH is a 78 y.o. female here today with complaint of dysuria, urgency and frequency.  Symptoms started about 2 days ago.  Similar to previous UTI's she has had in the past.  Keflex  has worked well for her in the past. Denies flank pain, nausea or vomiting or fever.   ROS:  A comprehensive ROS was completed and negative except as noted per HPI  Allergies  Allergen Reactions   Macrobid  [Nitrofurantoin ] Other (See Comments)    Severe cramping.     Meperidine Nausea Only and Nausea And Vomiting   Meperidine And Related Nausea And Vomiting   Oxycodone      loopy   Percocet [Oxycodone -Acetaminophen ]     Feels loopy   Demerol Nausea And Vomiting    Past Medical History:  Diagnosis Date   Arthritis    neck, back, hips, knees, ankles   Asthma    use everyday   Atypical mole 12/03/2017   Left Lower Shin (mild)   Atypical mole 05/21/2018   Left Deltoid (mild)   Atypical mole 05/21/2018   Left Tricep (moderate to severe)   Cancer (HCC) 2019   skin   Dyspnea    Dysrhythmia    PVCs , Afib   Heart murmur    History of kidney stones    Hyperlipidemia    Hypertension    Hyperthyroidism 08/08/2017   radio active iodine x2   Hypothyroidism    now take synthroid    Lymphedema 12/03/2021   Paroxysmal atrial fibrillation (HCC) 08/09/2015   PONV (postoperative nausea and vomiting)    Superficial basal cell carcinoma (BCC) 12/03/2017   Right Outer Abdomen Mid (curet and 5FU)    Past Surgical History:  Procedure Laterality Date   CARDIOVERSION N/A 08/05/2016   Procedure: CARDIOVERSION;  Surgeon: Raford Riggs, MD;  Location: [facility] ENDOSCOPY;  Service: Cardiovascular;  Laterality: N/A;   CATARACT EXTRACTION Left 09/2012   Dr. Charle Burke   LITHOTRIPSY     REPLACEMENT TOTAL KNEE BILATERAL  2011   REVERSE SHOULDER  ARTHROPLASTY Left 07/19/2020   Procedure: REVERSE SHOULDER ARTHROPLASTY;  Surgeon: Melita Drivers, MD;  Location: [facility];  Service: Orthopedics;  Laterality: Left;    REVERSE SHOULDER ARTHROPLASTY Right 12/06/2020   Procedure: REVERSE SHOULDER ARTHROPLASTY;  Surgeon: Melita Drivers, MD;  Location: [facility];  Service: Orthopedics;  Laterality: Right;    THYROIDECTOMY  1971   TUBAL LIGATION  1979    Social History   Socioeconomic History   Marital status: Married    Spouse name: Elspeth   Number of children: 2   Years of education: 16   Highest education level: Bachelor's degree (e.g., BA, AB, BS)  Occupational History   Occupation: Retired [employer].   Tobacco Use   Smoking status: Never   Smokeless tobacco: Never  Vaping Use   Vaping status: Never Used  Substance and Sexual Activity   Alcohol use: No   Drug use: No   Sexual activity: Not Currently  Other Topics Concern   Not on file  Social History Narrative   Lives with her husband. She enjoys singing.   Social Drivers of Corporate Investment Banker Strain: Low Risk  (01/20/2023)   Overall Financial Resource Strain (CARDIA)    Difficulty of Paying Living Expenses: Not hard at all  Food  Insecurity: No Food Insecurity (01/20/2023)   Hunger Vital Sign    Worried About Running Out of Food in the Last Year: Never true    Ran Out of Food in the Last Year: Never true  Transportation Needs: No Transportation Needs (01/20/2023)   PRAPARE - Administrator, Civil Service (Medical): No    Lack of Transportation (Non-Medical): No  Physical Activity: Insufficiently Active (01/20/2023)   Exercise Vital Sign    Days of Exercise per Week: 2 days    Minutes of Exercise per Session: 60 min  Stress: No Stress Concern Present (01/20/2023)   [employer] of Occupational Health - Occupational Stress Questionnaire    Feeling of Stress : Not at all  Social Connections: Socially Integrated (01/20/2023)   Social Connection  and Isolation Panel    Frequency of Communication with Friends and Family: More than three times a week    Frequency of Social Gatherings with Friends and Family: Once a week    Attends Religious Services: More than 4 times per year    Active Member of [employer] or Organizations: Yes    Attends Engineer, Structural: More than 4 times per year    Marital Status: Married    Family History  Problem Relation Age of Onset   Coronary artery disease Mother 10       deceased   Depression Mother    Hyperlipidemia Mother    Diabetes Paternal Grandmother    Thyroid  disease Sister    COPD Father    Breast cancer Neg Hx     Health Maintenance  Topic Date Due   Medicare Annual Wellness (AWV)  01/20/2024   COVID-19 Vaccine (4 - 2025-26 season) 02/25/2025 (Originally 11/09/2023)   DTaP/Tdap/Td (2 - Td or Tdap) 07/01/2025   Bone Density Scan  02/24/2026   Pneumococcal Vaccine: 50+ Years  Completed   Influenza Vaccine  Completed   Hepatitis C Screening  Completed   Zoster Vaccines- Shingrix  Completed   Meningococcal B Vaccine  Aged Out   Mammogram  Discontinued   Colonoscopy  Discontinued     ----------------------------------------------------------------------------------------------------------------------------------------------------------------------------------------------------------------- Physical Exam BP 135/75 (BP Location: Left Arm, Patient Position: Sitting, Cuff Size: Large)   Pulse 77   Ht 5' 2 (1.575 m)   Wt 224 lb (101.6 kg)   SpO2 99%   BMI 40.97 kg/m   Physical Exam Constitutional:      Appearance: Normal appearance.  Eyes:     General: No scleral icterus. Cardiovascular:     Rate and Rhythm: Normal rate and regular rhythm.  Pulmonary:     Effort: Pulmonary effort is normal.     Breath sounds: Normal breath sounds.  Abdominal:     Tenderness: There is no right CVA tenderness or left CVA tenderness.  Neurological:     Mental Status: She is alert.   Psychiatric:        Mood and Affect: Mood normal.        Behavior: Behavior normal.     ------------------------------------------------------------------------------------------------------------------------------------------------------------------------------------------------------------------- Assessment and Plan  Acute cystitis Treating with course of cephalexin .  Will change if needed based on culture results.  Encouraged increased fluids.  Red flags reviewed.    Meds ordered this encounter  Medications   cephALEXin  (KEFLEX ) 500 MG capsule    Sig: Take 1 capsule (500 mg total) by mouth 3 (three) times daily for 5 days.    Dispense:  15 capsule    Refill:  0    No  follow-ups on file.

## 2024-02-10 NOTE — Patient Instructions (Signed)

## 2024-02-15 ENCOUNTER — Ambulatory Visit: Payer: Self-pay | Admitting: Family Medicine

## 2024-02-15 LAB — URINE CULTURE

## 2024-02-15 MED ORDER — CEFPODOXIME PROXETIL 100 MG PO TABS: 100.0000 mg | ORAL_TABLET | Freq: Two times a day (BID) | ORAL | 0 refills | Status: AC

## 2024-02-18 ENCOUNTER — Other Ambulatory Visit: Payer: Self-pay | Admitting: Family Medicine

## 2024-02-18 ENCOUNTER — Inpatient Hospital Stay: Admission: RE | Admit: 2024-02-18 | Discharge: 2024-02-18 | Attending: Family Medicine | Admitting: Family Medicine

## 2024-02-18 DIAGNOSIS — N63 Unspecified lump in unspecified breast: Secondary | ICD-10-CM

## 2024-02-18 DIAGNOSIS — N6002 Solitary cyst of left breast: Secondary | ICD-10-CM

## 2024-02-24 ENCOUNTER — Other Ambulatory Visit: Payer: Self-pay | Admitting: Family Medicine

## 2024-02-24 DIAGNOSIS — I10 Essential (primary) hypertension: Secondary | ICD-10-CM

## 2024-02-24 NOTE — Telephone Encounter (Unsigned)
 Copied from CRM #[id number]. Topic: Clinical - Medication Refill >> Feb 24, 2024 11:03 AM Edsel HERO wrote: Medication: spironolactone  (ALDACTONE ) 25 MG tablet  Has the patient contacted their pharmacy? No (Agent: If no, request that the patient contact the pharmacy for the refill. If patient does not wish to contact the pharmacy document the reason why and proceed with request.) (Agent: If yes, when and what did the pharmacy advise?)  This is the patient's preferred pharmacy:  [facility] [place], [place] - [address] [place] [address] Phone: [phone] Fax: [phone]  Is this the correct pharmacy for this prescription? Yes If no, delete pharmacy and type the correct one.   Has the prescription been filled recently? Yes  Is the patient out of the medication? Yes  Has the patient been seen for an appointment in the last year OR does the patient have an upcoming appointment? Yes  Can we respond through MyChart? Yes  Agent: Please be advised that Rx refills may take up to 3 business days. We ask that you follow-up with your pharmacy.

## 2024-02-26 ENCOUNTER — Ambulatory Visit
Admission: RE | Admit: 2024-02-26 | Discharge: 2024-02-26 | Disposition: A | Source: Ambulatory Visit | Attending: Family Medicine | Admitting: Family Medicine

## 2024-02-26 ENCOUNTER — Inpatient Hospital Stay: Admission: RE | Admit: 2024-02-26 | Discharge: 2024-02-26 | Attending: Family Medicine | Admitting: Family Medicine

## 2024-02-26 DIAGNOSIS — N63 Unspecified lump in unspecified breast: Secondary | ICD-10-CM

## 2024-02-26 DIAGNOSIS — N6002 Solitary cyst of left breast: Secondary | ICD-10-CM

## 2024-02-26 HISTORY — PX: BREAST BIOPSY: SHX20

## 2024-02-26 NOTE — Telephone Encounter (Signed)
 Requesting rx rf of Spironolocatone 25mg  Last written 12/25/2023 as 3 day supply  Last OV 02/10/2024 Dr. Alvia sick visit Upcoming appt= none

## 2024-02-29 LAB — SURGICAL PATHOLOGY

## 2024-03-01 ENCOUNTER — Other Ambulatory Visit: Payer: Self-pay | Admitting: Family Medicine

## 2024-03-01 ENCOUNTER — Ambulatory Visit: Payer: Self-pay | Admitting: Family Medicine

## 2024-03-01 DIAGNOSIS — E890 Postprocedural hypothyroidism: Secondary | ICD-10-CM

## 2024-03-01 MED ORDER — SULFAMETHOXAZOLE-TRIMETHOPRIM 800-160 MG PO TABS: 1.0000 | ORAL_TABLET | Freq: Two times a day (BID) | ORAL | 0 refills | Status: DC

## 2024-03-01 NOTE — Telephone Encounter (Signed)
 Meds ordered this encounter  Medications   sulfamethoxazole -trimethoprim  (BACTRIM  DS) 800-160 MG tablet    Sig: Take 1 tablet by mouth 2 (two) times daily.    Dispense:  10 tablet    Refill:  0

## 2024-03-01 NOTE — Telephone Encounter (Signed)
 FYI Only or Action Required?: Action required by provider: clinical question for provider and update on patient condition.  Patient was last seen in primary care on 02/10/2024 by Alvia Bring, DO.  Called Nurse Triage reporting Dysuria.  Symptoms began several days ago.  Interventions attempted: Rest, hydration, or home remedies.  Symptoms are: unchanged.  Triage Disposition: See Physician Within 24 Hours  Patient/caregiver understands and will follow disposition?: Yes        Copied from CRM [id number]. Topic: Clinical - Red Word Triage >> Mar 01, 2024  8:52 AM Treva T wrote: [facility] that prompted transfer to Nurse Triage: Pt calling states she just completed antibiotics for UTI, Cefpodoxime  100 mg, two tablets a day for 5 days.   Pt reports after completing medication, symptoms of UTI have returned since this past Sunday, those being, bladder spaams, pain with urination, foamy bubbles and cloudy appearance with urine.  Pt reports she was just seen for this, and wants to know if another cheaper, medication can be called in to pharmacy since previous medication did not work.    Pt ph [phone] Reason for Disposition  Age > 50 years  Answer Assessment - Initial Assessment Questions 1. SEVERITY: How bad is the pain?  (e.g., Scale 1-10; mild, moderate, or severe)     Moderate  2. FREQUENCY: How many times have you had painful urination today?      Painful urination occurs with each urination  3. PATTERN: Is pain present every time you urinate or just sometimes?      Each urination   4. ONSET: When did the painful urination start?      X 3 days  5. FEVER: Do you have a fever? If Yes, ask: What is your temperature, how was it measured, and when did it start?     No   6. PAST UTI: Have you had a urine infection before? If Yes, ask: When was the last time? and What happened that time?       Was treated on 02/10/24 for similar symptoms   7. CAUSE:  What do you think is causing the painful urination?  (e.g., UTI, scratch, Herpes sore)     Untreated UTI   8. OTHER SYMPTOMS: Do you have any other symptoms? (e.g., blood in urine, flank pain, genital sores, urgency, vaginal discharge)      Cloudy urine noted  Patient called in to triage with complaints of cloudy urine, with pain with urination This has been ongoing for x 3 days.  The patient stated she completed the Cefpodoxime  100 mg as prescribed.  Patient declined an appointment, but would like the provider to call in another antibiotic for the symptoms. Please advise.  Protocols used: Urination Pain - Female-A-AH

## 2024-03-01 NOTE — Telephone Encounter (Signed)
 She can come to lab for repeat UA and culture.

## 2024-03-04 ENCOUNTER — Ambulatory Visit: Payer: Self-pay

## 2024-03-04 NOTE — Telephone Encounter (Signed)
 Message from [place] C sent at 03/04/2024  9:00 AM EST  Summary: urinary concerns / rx req   Reason for Triage: The patient has called to share that they are continuing to experience urinary discomfort, pressure, increased frequency and irritation when urinating. The patient would like to discuss their concerns with a member of clinical staff when possible. Please contact further           1st attempt to contact patient-no answer. Voicemail left to call back to Nurse Triage

## 2024-03-04 NOTE — Telephone Encounter (Signed)
 FYI Only or Action Required?: Action required by provider: update on patient condition.  Patient was last seen in primary care on 02/10/2024 by Alvia Bring, DO.  Called Nurse Triage reporting Urinary Frequency.  Symptoms began several days ago.  Interventions attempted: Nothing.  Symptoms are: unchanged.  Triage Disposition: Duplicate Contact Calls  Patient/caregiver understands and will follow disposition?: Yes    Message from [facility] sent at 03/04/2024  9:00 AM EST  Summary: urinary concerns / rx req   Reason for Triage: The patient has called to share that they are continuing to experience urinary discomfort, pressure, increased frequency and irritation when urinating. The patient would like to discuss their concerns with a member of clinical staff when possible. Please contact further           Reason for Disposition  Caller has already spoken with another triager or doctor (or NP/PA, pharmacist) AND has further questions AND triager able to answer questions.  Answer Assessment - Initial Assessment Questions Pt returned call regarding symptoms. Pt reports symptoms still present but not worsening; no hematuria, fever or discharge. Triaged pt and attempted to schedule but no appts until 12/29. Pt voiced frustration as she had called 12/23 with similar symptoms and was told no appts available. Offered to contact [facility] to see if pt could do walk in urinalysis today; pt voiced further frustration as it is too late in the day for her to travel to office and she will wait until Monday. This is absolutely ridiculous. I get these three times a year and I am told I always have to come in. I am a retired designer, jewellery so I get it, but I am not a typical patient. I shouldn't have to come in every time. Since pt stated she had called in before, reviewed chart and discussed with pt that PCP sent Bactrim  to her preferred pharmacy 12/23. Questioned if pt had picked up rx and she laughed  stating how am I supposed to know when medications are sent in. This has been a clausal comedy of mistakes. Reassured her rx was confirmed by pharmacy and  that if further urinary symptoms continue, she should be seen in office. She voiced understanding. No appt made.  Protocols used: No Contact or Duplicate Contact Call-A-AH

## 2024-03-04 NOTE — Telephone Encounter (Signed)
 Patient advised that the prescription was already sent to the pharmacy.

## 2024-03-11 ENCOUNTER — Other Ambulatory Visit: Payer: Self-pay | Admitting: Family Medicine

## 2024-03-11 DIAGNOSIS — M51369 Other intervertebral disc degeneration, lumbar region without mention of lumbar back pain or lower extremity pain: Secondary | ICD-10-CM

## 2024-03-11 DIAGNOSIS — G8929 Other chronic pain: Secondary | ICD-10-CM

## 2024-03-22 ENCOUNTER — Encounter: Payer: Self-pay | Admitting: Family Medicine

## 2024-03-22 ENCOUNTER — Ambulatory Visit: Admitting: Family Medicine

## 2024-03-22 VITALS — BP 135/77 | HR 90 | Temp 98.1°F | Ht 62.0 in | Wt 220.0 lb

## 2024-03-22 DIAGNOSIS — J40 Bronchitis, not specified as acute or chronic: Secondary | ICD-10-CM

## 2024-03-22 DIAGNOSIS — J329 Chronic sinusitis, unspecified: Secondary | ICD-10-CM

## 2024-03-22 DIAGNOSIS — I890 Lymphedema, not elsewhere classified: Secondary | ICD-10-CM

## 2024-03-22 MED ORDER — AMOXICILLIN-POT CLAVULANATE 875-125 MG PO TABS: 1.0000 | ORAL_TABLET | Freq: Two times a day (BID) | ORAL | 0 refills | Status: DC

## 2024-03-22 MED ORDER — PREDNISONE 20 MG PO TABS: 40.0000 mg | ORAL_TABLET | Freq: Every day | ORAL | 0 refills | Status: AC

## 2024-03-22 MED ORDER — HYDROCODONE BIT-HOMATROP MBR 5-1.5 MG/5ML PO SOLN: 5.0000 mL | Freq: Every evening | ORAL | 0 refills | Status: AC | PRN

## 2024-03-22 NOTE — Progress Notes (Signed)
 Pt reports that her sxs began 3 weeks ago. She has been taking Mucinex, Sudafed, and Affrin. She denies any f/s/c. She has had a productive cough

## 2024-03-22 NOTE — Assessment & Plan Note (Signed)
 Needs new referral. New insurance.  Last time they weren't able to schedule her.

## 2024-03-22 NOTE — Progress Notes (Signed)
 "  Acute Office Visit  Patient ID: Martha Perkins, female    DOB: 23-Sep-1945, 79 y.o.   MRN: [id number]  PCP: Alvan Dorothyann BIRCH, MD  Chief Complaint  Patient presents with   Cough    Subjective:     HPI  Discussed the use of AI scribe software for clinical note transcription with the patient, who gave verbal consent to proceed.  History of Present Illness Martha Perkins is a 79 year old female who presents with a persistent cough and sinus pressure.  Cough - Persistent since New Year's Eve - Occasionally uncontrollable - Productive with mostly white sputum, occasionally yellow - Severity leads to urinary incontinence - Unable to attend church for two weeks due to cough - No fever during this illness - History of using hydrocodone  and codeine cough syrups in the past without adverse effects  Sinus pressure and facial pain - Facial pain and pressure, particularly on the right side - Cheeks are painful  Respiratory symptoms - Wheezing and shortness of breath when lying down at night - Symptoms affect ability to sleep  Impact on daily life - Unable to attend church for two weeks - Cat becomes upset when she coughs  Prior treatment and medication inquiry - Has required prednisone  in the past for similar symptoms - Inquires about use of prednisone  for current symptoms   ROS     Objective:    BP 135/77   Pulse 90   Temp 98.1 F (36.7 C) (Oral)   Ht 5' 2 (1.575 m)   Wt 220 lb (99.8 kg)   SpO2 95%   BMI 40.24 kg/m    Physical Exam Constitutional:      Appearance: Normal appearance.  HENT:     Head: Normocephalic and atraumatic.     Right Ear: Tympanic membrane, ear canal and external ear normal. There is no impacted cerumen.     Left Ear: Tympanic membrane, ear canal and external ear normal. There is no impacted cerumen.     Nose: Nose normal.     Mouth/Throat:     Pharynx: Oropharynx is clear.  Eyes:     Conjunctiva/sclera: Conjunctivae normal.   Cardiovascular:     Rate and Rhythm: Normal rate and regular rhythm.  Pulmonary:     Effort: Pulmonary effort is normal.     Breath sounds: Rhonchi present. No wheezing.  Musculoskeletal:     Cervical back: Neck supple. No tenderness.  Lymphadenopathy:     Cervical: No cervical adenopathy.  Skin:    General: Skin is warm and dry.  Neurological:     Mental Status: She is alert and oriented to person, place, and time.  Psychiatric:        Mood and Affect: Mood normal.       No results found for any visits on 03/22/24.     Assessment & Plan:   Problem List Items Addressed This Visit       Other   Lymphedema of both lower extremities   Needs new referral. New insurance.  Last time they weren't able to schedule her.        Relevant Orders   Ambulatory referral to Physical Therapy   Other Visit Diagnoses       Sinobronchitis    -  Primary   Relevant Medications   HYDROcodone  bit-homatropine (HYCODAN) 5-1.5 MG/5ML syrup   amoxicillin -clavulanate (AUGMENTIN ) 875-125 MG tablet   predniSONE  (DELTASONE ) 20 MG tablet       Assessment  and Plan Assessment & Plan Acute sinusitis and acute bronchitis Persistent cough with sputum, facial pain, and wheezing. Likely bronchial drainage causing wheezing. Previous episodes required prednisone . - Prescribed antibiotic for sinusitis and bronchitis. - Advised increased fluid intake. - Recommended Tylenol  as needed for pain. - Prescribed prednisone  for wheezing. - Prescribed hydrocodone  or codeine cough syrup for nighttime use.  Urinary incontinence Occurs during severe coughing episodes.    Meds ordered this encounter  Medications   HYDROcodone  bit-homatropine (HYCODAN) 5-1.5 MG/5ML syrup    Sig: Take 5 mLs by mouth at bedtime as needed for cough.    Dispense:  120 mL    Refill:  0   amoxicillin -clavulanate (AUGMENTIN ) 875-125 MG tablet    Sig: Take 1 tablet by mouth 2 (two) times daily.    Dispense:  14 tablet     Refill:  0   predniSONE  (DELTASONE ) 20 MG tablet    Sig: Take 2 tablets (40 mg total) by mouth daily with breakfast.    Dispense:  10 tablet    Refill:  0    No follow-ups on file.  Dorothyann Byars, MD [facility] at [facility]   "

## 2024-03-30 ENCOUNTER — Ambulatory Visit: Payer: Self-pay

## 2024-03-30 NOTE — Telephone Encounter (Signed)
 Prednisone  and abx 1/13, c/o worsening sinus pain and chest tightness since. No appts available until next week, pt unwilling to go to alternate office, please notify pt if anything opens up.  FYI Only or Action Required?: Action required by provider: request for appointment.  Patient was last seen in primary care on 03/22/2024 by Alvan Dorothyann BIRCH, MD.  Called Nurse Triage reporting URI and Cough.  Symptoms began a week ago.  Interventions attempted: Prescription medications: prednisone  and abx.  Symptoms are: gradually worsening.  Triage Disposition: See PCP When Office is Open (Within 3 Days)  Patient/caregiver understands and will follow disposition?: Yes  Reason for Triage: patient was seen on 03/22/2024 and was prescribed a rx and she says that she is not getting any better she has tightness in her chest as well as increase mucous and discolored    Reason for Disposition  [1] Nasal discharge AND [2] present > 10 days    Abx finished with no relief  Answer Assessment - Initial Assessment Questions 1. ONSET: When did the cough begin?      1/13    3. SPUTUM: Describe the color of your sputum (e.g., none, dry cough; clear, white, yellow, green)     White and yellow  4. HEMOPTYSIS: Are you coughing up any blood? If Yes, ask: How much? (e.g., flecks, streaks, tablespoons, etc.)     Denies  5. DIFFICULTY BREATHING: Are you having difficulty breathing? If Yes, ask: How bad is it? (e.g., mild, moderate, severe)      denies  6. FEVER: Do you have a fever? If Yes, ask: What is your temperature, how was it measured, and when did it start?     Fever   8. LUNG HISTORY: Do you have any history of lung disease?  (e.g., pulmonary embolus, asthma, emphysema)     RAD  10. OTHER SYMPTOMS: Do you have any other symptoms? (e.g., runny nose, wheezing, chest pain)       Chest tightness, sinus pain  Protocols used: Cough - Acute Productive-A-AH

## 2024-03-30 NOTE — Telephone Encounter (Signed)
 FYI - the patient was seen on 03/22/2024 and was prescribed a abx rx. She says that she is not getting any better, having tightness in her chest as well as increase mucous that is discolored. No available appointments this week and patient does not want to go to another location. Please advise, thanks.

## 2024-03-31 NOTE — Telephone Encounter (Signed)
 Okay to put her on for e-visit at 420 with me if she is okay with that if we have someone can so we might be able to move it up especially if it is just a video visit.

## 2024-03-31 NOTE — Telephone Encounter (Signed)
 Attempted call to patient. Left a voicemail message with my direct number for return call.

## 2024-04-04 NOTE — Telephone Encounter (Signed)
 Attempted call to patient. Left a voice mail message requesting a return call to schedule a virtual visit.

## 2024-04-06 ENCOUNTER — Ambulatory Visit: Payer: Self-pay

## 2024-04-06 NOTE — Telephone Encounter (Signed)
 FYI Only or Action Required?: FYI only for provider: appointment scheduled on 04/07/24.  Patient was last seen in primary care on 03/22/2024 by Alvan Dorothyann BIRCH, MD.  Called Nurse Triage reporting Urinary Frequency and Dysuria.  Symptoms began several days ago.  Interventions attempted: Other: cranberry juice.  Symptoms are: gradually worsening.  Triage Disposition: See Physician Within 24 Hours (overriding See HCP Within 4 Hours (Or PCP Triage))  Patient/caregiver understands and will follow disposition?: Yes                  Message from Mercer PEDLAR sent at 04/06/2024 12:05 PM EST  Reason for Triage: UTI symptoms, burning with urination, frequent urination.   Reason for Disposition  Has had heart valve replacement or joint replacement  Answer Assessment - Initial Assessment Questions 1. SEVERITY: How bad is the pain?  (e.g., Scale 1-10; mild, moderate, or severe)     It's pretty bad today, rotten  2. FREQUENCY: How many times have you had painful urination today?      4 times.  3. PATTERN: Is pain present every time you urinate or just sometimes?      Every time.  4. ONSET: When did the painful urination start?      Sunday night. Worsened this AM.  5. FEVER: Do you have a fever? If Yes, ask: What is your temperature, how was it measured, and when did it start?     No.  6. PAST UTI: Have you had a urine infection before? If Yes, ask: When was the last time? and What happened that time?      Yes. 4th episode in last couple months. Treating with antibiotics. Recurrent.  7. CAUSE: What do you think is causing the painful urination?  (e.g., UTI, scratch, Herpes sore)     UTI.  8. OTHER SYMPTOMS: Do you have any other symptoms? (e.g., blood in urine, flank pain, genital sores, urgency, vaginal discharge)     Bladder pressure and spasms, feels like it is not fully emptying; chills. No vomiting, fever, back or flank pain.  Protocols  used: Urination Pain - Female-A-AH

## 2024-04-06 NOTE — Telephone Encounter (Signed)
 Patient shows scheduled 04/07/2024 8:20 am with Benton Gave, NP

## 2024-04-07 ENCOUNTER — Encounter: Payer: Self-pay | Admitting: Urgent Care

## 2024-04-07 ENCOUNTER — Ambulatory Visit: Admitting: Urgent Care

## 2024-04-07 ENCOUNTER — Other Ambulatory Visit (INDEPENDENT_AMBULATORY_CARE_PROVIDER_SITE_OTHER)

## 2024-04-07 VITALS — BP 128/75 | HR 75 | Temp 98.0°F | Ht 62.0 in | Wt 223.0 lb

## 2024-04-07 DIAGNOSIS — N3000 Acute cystitis without hematuria: Secondary | ICD-10-CM | POA: Diagnosis not present

## 2024-04-07 DIAGNOSIS — R309 Painful micturition, unspecified: Secondary | ICD-10-CM | POA: Diagnosis not present

## 2024-04-07 LAB — POCT URINALYSIS DIP (CLINITEK)
Bilirubin, UA: NEGATIVE
Glucose, UA: NEGATIVE mg/dL
Ketones, POC UA: NEGATIVE mg/dL
Nitrite, UA: POSITIVE — AB
POC PROTEIN,UA: NEGATIVE
Spec Grav, UA: >=1.03 — AB
Urobilinogen, UA: 0.2 U/dL
pH, UA: 7

## 2024-04-07 MED ORDER — METHENAMINE HIPPURATE 1 G PO TABS: 1.0000 g | ORAL_TABLET | Freq: Two times a day (BID) | ORAL | 0 refills | Status: AC

## 2024-04-07 MED ORDER — SULFAMETHOXAZOLE-TRIMETHOPRIM 400-80 MG PO TABS: 1.0000 | ORAL_TABLET | Freq: Two times a day (BID) | ORAL | 0 refills | Status: DC

## 2024-04-07 NOTE — Progress Notes (Signed)
 "  Established Patient Office Visit  Subjective:  Patient ID: Martha Perkins, female    DOB: [birth year]  Age: 79 y.o. MRN: [id number]  Chief Complaint  Patient presents with   Urinary Tract Infection    Urinary Tract Infection     Discussed the use of AI scribe software for clinical note transcription with the patient, who gave verbal consent to proceed.  History of Present Illness   Martha Perkins is a 79 year old female who presents with recurrent urinary tract infections.  She has experienced approximately four urinary tract infections in the last few months. The current symptoms are consistent with previous episodes, with no significant changes. No fever, flank pain, abdominal pain, or hematuria. After completing antibiotics for her last UTI, her symptoms of frequency and pain resolved completely. Current sx returned 3-4 days ago. Last abx for UTI was cefpodoximine. Also recently completed augmentin  for URI. Does have hx of kidney stones, but is uncertain if this is contributing. Admits to not drinking much water  throughout the day.  She recently had an upper respiratory infection over New Year's and was prescribed prednisone  due to reactive airway disease. She continues to experience some wheezing and coughing from this URI. Use of PRN albuterol  does help.        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   Aortic atherosclerosis (HCC), LDL goal < 70 12/26/2019   Palpitations 10/21/2019   Morbid obesity (HCC) 10/21/2019   Encounter for chronic pain  management 06/30/2019   Hypothyroidism 09/11/2016   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/14/2016   Restrictive lung disease 09/28/2015   Lumbar degenerative disc disease 08/15/2015   PAF (paroxysmal atrial fibrillation) (HCC) 08/09/2015   Chronic allergic rhinitis 05/17/2014   Cataract 06/03/2012   Hyperlipidemia LDL goal <100 12/21/2010   Essential hypertension, goal Less than 130/85 12/21/2010   GERD (gastroesophageal reflux disease) 07/18/2010   Insulin resistance 07/18/2010   Fatty liver 07/18/2010   Past Medical History:  Diagnosis Date   Arthritis    neck, back, hips, knees, ankles   Asthma    use everyday   Atypical mole 12/03/2017   Left Lower Shin (mild)   Atypical mole 05/21/2018   Left Deltoid (mild)   Atypical mole 05/21/2018   Left Tricep (moderate to severe)   Cancer (HCC) 2019   skin   Dyspnea    Dysrhythmia    PVCs , Afib   Heart murmur    History of kidney stones    Hyperlipidemia    Hypertension    Hyperthyroidism 08/08/2017   radio active iodine x2   Hypothyroidism    now take synthroid    Lymphedema 12/03/2021   Paroxysmal atrial fibrillation (HCC) 08/09/2015   PONV (postoperative nausea and vomiting)    Superficial basal cell carcinoma (BCC) 12/03/2017   Right Outer Abdomen Mid (curet and 5FU)   Past Surgical History:  Procedure Laterality Date   BREAST BIOPSY Left 02/26/2024   US  LT BREAST BX W LOC DEV 1ST LESION IMG BX SPEC US  GUIDE 02/26/2024 [facility] MAMMOGRAPHY   CARDIOVERSION N/A  08/05/2016   Procedure: CARDIOVERSION;  Surgeon: Raford Riggs, MD;  Location: [facility] ENDOSCOPY;  Service: Cardiovascular;  Laterality: N/A;   CATARACT EXTRACTION Left 09/2012   Dr. Charle Burke   LITHOTRIPSY     REPLACEMENT TOTAL KNEE BILATERAL  2011   REVERSE SHOULDER ARTHROPLASTY Left 07/19/2020   Procedure: REVERSE SHOULDER ARTHROPLASTY;  Surgeon: Melita Drivers, MD;  Location: [facility] ORS;  Service: Orthopedics;  Laterality: Left;    REVERSE SHOULDER  ARTHROPLASTY Right 12/06/2020   Procedure: REVERSE SHOULDER ARTHROPLASTY;  Surgeon: Melita Drivers, MD;  Location: [facility];  Service: Orthopedics;  Laterality: Right;    THYROIDECTOMY  1971   TUBAL LIGATION  1979   Social History[1]    ROS: as noted in HPI  Objective:     BP 128/75   Pulse 75   Temp 98 F (36.7 C)   Ht 5' 2 (1.575 m)   Wt 223 lb (101.2 kg)   BMI 40.79 kg/m  BP Readings from Last 3 Encounters:  04/07/24 128/75  03/22/24 135/77  02/10/24 135/75   Wt Readings from Last 3 Encounters:  04/07/24 223 lb (101.2 kg)  03/22/24 220 lb (99.8 kg)  02/10/24 224 lb (101.6 kg)      Physical Exam Vitals and nursing note reviewed. Exam conducted with a chaperone present.  Constitutional:      General: She is not in acute distress.    Appearance: Normal appearance. She is not ill-appearing, toxic-appearing or diaphoretic.  HENT:     Head: Normocephalic and atraumatic.  Eyes:     General:        Right eye: No discharge.        Left eye: No discharge.     Extraocular Movements: Extraocular movements intact.     Pupils: Pupils are equal, round, and reactive to light.  Cardiovascular:     Rate and Rhythm: Normal rate.  Pulmonary:     Effort: Pulmonary effort is normal. No respiratory distress.  Abdominal:     General: Abdomen is flat. There is no distension.     Palpations: Abdomen is soft.     Tenderness: There is no abdominal tenderness. There is no right CVA tenderness, left CVA tenderness, guarding or rebound.  Skin:    General: Skin is warm and dry.     Coloration: Skin is not jaundiced.     Findings: No bruising, erythema or rash.  Neurological:     Mental Status: She is alert and oriented to person, place, and time.      Results for orders placed or performed in visit on 04/07/24  POCT URINALYSIS DIP (CLINITEK)  Result Value Ref Range   Color, UA yellow yellow   Clarity, UA cloudy (A) clear   Glucose, UA negative negative mg/dL   Bilirubin,  UA negative negative   Ketones, POC UA negative negative mg/dL   Spec Grav, UA >=8.969 (A) 1.010 - 1.025   Blood, UA moderate (A) negative   pH, UA 7.0 5.0 - 8.0   POC PROTEIN,UA negative negative, trace   Urobilinogen, UA 0.2 0.2 or 1.0 E.U./dL   Nitrite, UA Positive (A) Negative   Leukocytes, UA Moderate (2+) (A) Negative    Last CBC Lab Results  Component Value Date   WBC 6.1 01/29/2024   HGB 13.9 01/29/2024   HCT 41.2 01/29/2024   MCV 95 01/29/2024   MCH 32.0 01/29/2024   RDW 12.4 01/29/2024   PLT 228 01/29/2024   Last metabolic panel Lab Results  Component Value Date   GLUCOSE 90 01/29/2024   NA 141 01/29/2024   K 4.6 01/29/2024   CL 102 01/29/2024   CO2 23 01/29/2024   BUN 16 01/29/2024   CREATININE 0.73 01/29/2024   EGFR 85 01/29/2024   CALCIUM  9.9 01/29/2024   PROT 7.1 01/29/2024   ALBUMIN 4.4 01/29/2024   LABGLOB 2.7 01/29/2024   BILITOT 0.9 01/29/2024   ALKPHOS 70 01/29/2024   AST 29 01/29/2024   ALT 38 (H) 01/29/2024   ANIONGAP 7 12/06/2020      The ASCVD Risk score (Arnett DK, et al., 2019) failed to calculate for the following reasons:   According to ACC/AHA guidelines, patients with an LDL-C level greater than 190 mg/dL (5.08 mmol/L) should be considered for high-intensity statin therapy. This patient's most recent LDL-C level is 195 mg/dL.  Assessment & Plan:  Acute cystitis without hematuria -     Methenamine  Hippurate; Take 1 tablet (1 g total) by mouth 2 (two) times daily with a meal.  Dispense: 180 tablet; Refill: 0 -     Sulfamethoxazole -Trimethoprim ; Take 1 tablet by mouth 2 (two) times daily.  Dispense: 14 tablet; Refill: 0  Assessment and Plan    Recurrent urinary tract infections Recurrent UTIs with four episodes recently. Symptoms resolve post-antibiotics. Possible contributing factors include not enough water  intake and diuretics. Discussed prophylactic options and topical estrogen cream for prevention. - Start single strength bactrim   BID x 7 days - return in 2 weeks for urine culture to ensure complete resolution (test of cure) - Discuss prophylactic options including Hiprex  and cranberry caps - must increase water  daily - consider topical estrogen cream should sx recur despite the above tx.        No follow-ups on file.   Benton LITTIE Gave, PA    [1]  Social History Tobacco Use   Smoking status: Never   Smokeless tobacco: Never  Vaping Use   Vaping status: Never Used  Substance Use Topics   Alcohol use: No   Drug use: No   "

## 2024-04-07 NOTE — Patient Instructions (Signed)
 Start the bactrim  twice daily. Drink plenty of water  on this medication.  Take the Hipprex twice daily for the next 3 months.  Please return in 2 weeks for a nurse visit - urine sample only. We did not have enough sample today to send out for crystals, so will consider this upon follow up in 2 weeks.

## 2024-04-11 ENCOUNTER — Ambulatory Visit: Payer: Self-pay | Admitting: Urgent Care

## 2024-04-11 DIAGNOSIS — N3000 Acute cystitis without hematuria: Secondary | ICD-10-CM

## 2024-04-11 LAB — URINE CULTURE

## 2024-04-11 MED ORDER — CIPROFLOXACIN HCL 250 MG PO TABS: 250.0000 mg | ORAL_TABLET | Freq: Two times a day (BID) | ORAL | 0 refills | Status: AC
